# Patient Record
Sex: Female | Born: 1935
Health system: Southern US, Community
[De-identification: ages and names within clinical notes are randomized; demographics above are authoritative.]

## PROBLEM LIST (undated history)

## (undated) DIAGNOSIS — N12 Tubulo-interstitial nephritis, not specified as acute or chronic: Secondary | ICD-10-CM

## (undated) DIAGNOSIS — K8020 Calculus of gallbladder without cholecystitis without obstruction: Secondary | ICD-10-CM

## (undated) DIAGNOSIS — K5900 Constipation, unspecified: Secondary | ICD-10-CM

## (undated) DIAGNOSIS — G4700 Insomnia, unspecified: Secondary | ICD-10-CM

## (undated) DIAGNOSIS — N320 Bladder-neck obstruction: Secondary | ICD-10-CM

## (undated) DIAGNOSIS — Z87440 Personal history of urinary (tract) infections: Secondary | ICD-10-CM

## (undated) DIAGNOSIS — E11622 Type 2 diabetes mellitus with other skin ulcer: Secondary | ICD-10-CM

## (undated) DIAGNOSIS — E039 Hypothyroidism, unspecified: Secondary | ICD-10-CM

## (undated) DIAGNOSIS — N200 Calculus of kidney: Secondary | ICD-10-CM

## (undated) DIAGNOSIS — J3089 Other allergic rhinitis: Secondary | ICD-10-CM

## (undated) DIAGNOSIS — F32A Depression, unspecified: Secondary | ICD-10-CM

## (undated) DIAGNOSIS — N10 Acute tubulo-interstitial nephritis: Secondary | ICD-10-CM

## (undated) DIAGNOSIS — E550 Rickets, active: Secondary | ICD-10-CM

## (undated) DIAGNOSIS — F0280 Dementia in other diseases classified elsewhere without behavioral disturbance: Secondary | ICD-10-CM

## (undated) DIAGNOSIS — I10 Essential (primary) hypertension: Secondary | ICD-10-CM

## (undated) DIAGNOSIS — M21372 Foot drop, left foot: Secondary | ICD-10-CM

## (undated) DIAGNOSIS — Z87442 Personal history of urinary calculi: Secondary | ICD-10-CM

## (undated) DIAGNOSIS — Z960 Presence of urogenital implants: Secondary | ICD-10-CM

## (undated) DIAGNOSIS — K219 Gastro-esophageal reflux disease without esophagitis: Secondary | ICD-10-CM

## (undated) DIAGNOSIS — F29 Unspecified psychosis not due to a substance or known physiological condition: Secondary | ICD-10-CM

## (undated) DIAGNOSIS — R32 Unspecified urinary incontinence: Secondary | ICD-10-CM

## (undated) DIAGNOSIS — L98419 Non-pressure chronic ulcer of buttock with unspecified severity: Secondary | ICD-10-CM

## (undated) DIAGNOSIS — I499 Cardiac arrhythmia, unspecified: Secondary | ICD-10-CM

## (undated) DIAGNOSIS — R197 Diarrhea, unspecified: Secondary | ICD-10-CM

## (undated) DIAGNOSIS — K2100 Gastro-esophageal reflux disease with esophagitis, without bleeding: Secondary | ICD-10-CM

## (undated) DIAGNOSIS — E119 Type 2 diabetes mellitus without complications: Secondary | ICD-10-CM

## (undated) DIAGNOSIS — M1990 Unspecified osteoarthritis, unspecified site: Secondary | ICD-10-CM

## (undated) DIAGNOSIS — Z978 Presence of other specified devices: Secondary | ICD-10-CM

## (undated) DIAGNOSIS — G309 Alzheimer's disease, unspecified: Secondary | ICD-10-CM

## (undated) DIAGNOSIS — E559 Vitamin D deficiency, unspecified: Secondary | ICD-10-CM

## (undated) DIAGNOSIS — M21371 Foot drop, right foot: Secondary | ICD-10-CM

## (undated) DIAGNOSIS — A419 Sepsis, unspecified organism: Secondary | ICD-10-CM

## (undated) DIAGNOSIS — E785 Hyperlipidemia, unspecified: Secondary | ICD-10-CM

## (undated) DIAGNOSIS — F329 Major depressive disorder, single episode, unspecified: Secondary | ICD-10-CM

## (undated) HISTORY — DX: Unspecified osteoarthritis, unspecified site: M19.90

## (undated) HISTORY — PX: ABDOMINAL HYSTERECTOMY: SHX81

## (undated) HISTORY — DX: Vitamin D deficiency, unspecified: E55.9

## (undated) HISTORY — DX: Constipation, unspecified: K59.00

## (undated) HISTORY — DX: Essential (primary) hypertension: I10

## (undated) HISTORY — DX: Dementia in other diseases classified elsewhere, unspecified severity, without behavioral disturbance, psychotic disturbance, mood disturbance, and anxiety: F02.80

## (undated) HISTORY — DX: Rickets, active: E55.0

## (undated) HISTORY — DX: Gastro-esophageal reflux disease with esophagitis, without bleeding: K21.00

## (undated) HISTORY — DX: Unspecified psychosis not due to a substance or known physiological condition: F29

## (undated) HISTORY — DX: Gastro-esophageal reflux disease with esophagitis: K21.0

## (undated) HISTORY — DX: Alzheimer's disease, unspecified: G30.9

---

## 1995-02-02 HISTORY — PX: KNEE ARTHROSCOPY: SHX127

## 1997-06-14 ENCOUNTER — Other Ambulatory Visit: Admission: RE | Admit: 1997-06-14 | Discharge: 1997-06-14 | Payer: Self-pay | Admitting: *Deleted

## 1997-07-12 ENCOUNTER — Other Ambulatory Visit: Admission: RE | Admit: 1997-07-12 | Discharge: 1997-07-12 | Payer: Self-pay | Admitting: *Deleted

## 1997-12-30 ENCOUNTER — Ambulatory Visit (HOSPITAL_COMMUNITY): Admission: RE | Admit: 1997-12-30 | Discharge: 1997-12-30 | Payer: Self-pay | Admitting: *Deleted

## 1998-01-18 ENCOUNTER — Emergency Department (HOSPITAL_COMMUNITY): Admission: EM | Admit: 1998-01-18 | Discharge: 1998-01-18 | Payer: Self-pay | Admitting: Emergency Medicine

## 1998-01-18 ENCOUNTER — Encounter: Payer: Self-pay | Admitting: Emergency Medicine

## 1998-05-19 ENCOUNTER — Ambulatory Visit (HOSPITAL_COMMUNITY): Admission: RE | Admit: 1998-05-19 | Discharge: 1998-05-19 | Payer: Self-pay | Admitting: Orthopedic Surgery

## 1998-05-19 ENCOUNTER — Encounter: Payer: Self-pay | Admitting: Orthopedic Surgery

## 1998-09-25 ENCOUNTER — Ambulatory Visit (HOSPITAL_COMMUNITY): Admission: RE | Admit: 1998-09-25 | Discharge: 1998-09-25 | Payer: Self-pay | Admitting: *Deleted

## 1998-09-25 ENCOUNTER — Encounter: Payer: Self-pay | Admitting: *Deleted

## 1998-11-12 ENCOUNTER — Ambulatory Visit (HOSPITAL_COMMUNITY): Admission: RE | Admit: 1998-11-12 | Discharge: 1998-11-12 | Payer: Self-pay | Admitting: *Deleted

## 1998-11-12 ENCOUNTER — Encounter: Payer: Self-pay | Admitting: *Deleted

## 1998-12-19 ENCOUNTER — Encounter: Payer: Self-pay | Admitting: *Deleted

## 1998-12-19 ENCOUNTER — Ambulatory Visit (HOSPITAL_COMMUNITY): Admission: RE | Admit: 1998-12-19 | Discharge: 1998-12-19 | Payer: Self-pay | Admitting: *Deleted

## 1999-01-20 ENCOUNTER — Ambulatory Visit (HOSPITAL_COMMUNITY): Admission: RE | Admit: 1999-01-20 | Discharge: 1999-01-20 | Payer: Self-pay | Admitting: *Deleted

## 1999-01-20 ENCOUNTER — Encounter: Payer: Self-pay | Admitting: *Deleted

## 1999-03-01 ENCOUNTER — Ambulatory Visit (HOSPITAL_COMMUNITY): Admission: RE | Admit: 1999-03-01 | Discharge: 1999-03-01 | Payer: Self-pay | Admitting: *Deleted

## 1999-03-01 ENCOUNTER — Encounter: Payer: Self-pay | Admitting: *Deleted

## 1999-04-13 ENCOUNTER — Encounter: Payer: Self-pay | Admitting: *Deleted

## 1999-04-13 ENCOUNTER — Ambulatory Visit (HOSPITAL_COMMUNITY): Admission: RE | Admit: 1999-04-13 | Discharge: 1999-04-13 | Payer: Self-pay | Admitting: *Deleted

## 1999-06-29 ENCOUNTER — Encounter: Payer: Self-pay | Admitting: *Deleted

## 1999-06-30 ENCOUNTER — Encounter: Payer: Self-pay | Admitting: *Deleted

## 1999-07-01 ENCOUNTER — Inpatient Hospital Stay (HOSPITAL_COMMUNITY): Admission: RE | Admit: 1999-07-01 | Discharge: 1999-07-02 | Payer: Self-pay | Admitting: *Deleted

## 1999-07-01 ENCOUNTER — Encounter: Payer: Self-pay | Admitting: *Deleted

## 1999-07-02 ENCOUNTER — Encounter: Payer: Self-pay | Admitting: *Deleted

## 1999-08-02 ENCOUNTER — Encounter: Payer: Self-pay | Admitting: *Deleted

## 1999-08-02 ENCOUNTER — Ambulatory Visit (HOSPITAL_COMMUNITY): Admission: RE | Admit: 1999-08-02 | Discharge: 1999-08-02 | Payer: Self-pay | Admitting: *Deleted

## 1999-09-02 ENCOUNTER — Ambulatory Visit (HOSPITAL_COMMUNITY): Admission: RE | Admit: 1999-09-02 | Discharge: 1999-09-02 | Payer: Self-pay | Admitting: *Deleted

## 1999-09-02 ENCOUNTER — Encounter: Payer: Self-pay | Admitting: *Deleted

## 2000-02-09 ENCOUNTER — Ambulatory Visit (HOSPITAL_COMMUNITY): Admission: RE | Admit: 2000-02-09 | Discharge: 2000-02-09 | Payer: Self-pay | Admitting: *Deleted

## 2000-02-09 ENCOUNTER — Encounter: Payer: Self-pay | Admitting: Internal Medicine

## 2001-04-03 ENCOUNTER — Encounter: Admission: RE | Admit: 2001-04-03 | Discharge: 2001-04-03 | Payer: Self-pay | Admitting: Internal Medicine

## 2001-04-03 ENCOUNTER — Encounter: Payer: Self-pay | Admitting: Internal Medicine

## 2001-05-04 ENCOUNTER — Encounter: Payer: Self-pay | Admitting: Pain Medicine

## 2001-05-04 ENCOUNTER — Encounter: Admission: RE | Admit: 2001-05-04 | Discharge: 2001-05-04 | Payer: Self-pay | Admitting: Pain Medicine

## 2001-08-18 ENCOUNTER — Encounter: Payer: Self-pay | Admitting: Emergency Medicine

## 2001-08-18 ENCOUNTER — Emergency Department (HOSPITAL_COMMUNITY): Admission: EM | Admit: 2001-08-18 | Discharge: 2001-08-18 | Payer: Self-pay | Admitting: Emergency Medicine

## 2001-11-25 ENCOUNTER — Encounter: Payer: Self-pay | Admitting: Pain Medicine

## 2001-11-25 ENCOUNTER — Encounter: Admission: RE | Admit: 2001-11-25 | Discharge: 2001-11-25 | Payer: Self-pay | Admitting: Pain Medicine

## 2003-11-01 ENCOUNTER — Emergency Department (HOSPITAL_COMMUNITY): Admission: EM | Admit: 2003-11-01 | Discharge: 2003-11-01 | Payer: Self-pay | Admitting: Emergency Medicine

## 2004-01-02 ENCOUNTER — Ambulatory Visit (HOSPITAL_COMMUNITY): Admission: RE | Admit: 2004-01-02 | Discharge: 2004-01-02 | Payer: Self-pay | Admitting: *Deleted

## 2004-03-18 ENCOUNTER — Ambulatory Visit: Payer: Self-pay | Admitting: Internal Medicine

## 2005-02-02 ENCOUNTER — Emergency Department (HOSPITAL_COMMUNITY): Admission: EM | Admit: 2005-02-02 | Discharge: 2005-02-02 | Payer: Self-pay | Admitting: Emergency Medicine

## 2006-01-11 ENCOUNTER — Inpatient Hospital Stay (HOSPITAL_COMMUNITY): Admission: EM | Admit: 2006-01-11 | Discharge: 2006-01-17 | Payer: Self-pay | Admitting: Emergency Medicine

## 2006-01-11 ENCOUNTER — Ambulatory Visit: Payer: Self-pay | Admitting: Cardiology

## 2011-02-02 DIAGNOSIS — E785 Hyperlipidemia, unspecified: Secondary | ICD-10-CM | POA: Diagnosis not present

## 2011-02-02 DIAGNOSIS — H04123 Dry eye syndrome of bilateral lacrimal glands: Secondary | ICD-10-CM | POA: Diagnosis not present

## 2011-02-02 DIAGNOSIS — E568 Deficiency of other vitamins: Secondary | ICD-10-CM | POA: Diagnosis not present

## 2011-02-02 DIAGNOSIS — G301 Alzheimer's disease with late onset: Secondary | ICD-10-CM | POA: Diagnosis not present

## 2011-02-02 DIAGNOSIS — F23 Brief psychotic disorder: Secondary | ICD-10-CM | POA: Diagnosis not present

## 2011-02-02 DIAGNOSIS — G890 Central pain syndrome: Secondary | ICD-10-CM | POA: Diagnosis not present

## 2011-02-02 DIAGNOSIS — H2510 Age-related nuclear cataract, unspecified eye: Secondary | ICD-10-CM | POA: Diagnosis not present

## 2011-02-02 DIAGNOSIS — R059 Cough, unspecified: Secondary | ICD-10-CM | POA: Diagnosis not present

## 2011-02-02 DIAGNOSIS — E7800 Pure hypercholesterolemia, unspecified: Secondary | ICD-10-CM | POA: Diagnosis not present

## 2011-02-02 DIAGNOSIS — E875 Hyperkalemia: Secondary | ICD-10-CM | POA: Diagnosis not present

## 2011-02-02 DIAGNOSIS — Z794 Long term (current) use of insulin: Secondary | ICD-10-CM | POA: Diagnosis not present

## 2011-02-02 DIAGNOSIS — J208 Acute bronchitis due to other specified organisms: Secondary | ICD-10-CM | POA: Diagnosis not present

## 2011-02-02 DIAGNOSIS — G4700 Insomnia, unspecified: Secondary | ICD-10-CM | POA: Diagnosis not present

## 2011-02-02 DIAGNOSIS — L820 Inflamed seborrheic keratosis: Secondary | ICD-10-CM | POA: Diagnosis not present

## 2011-02-02 DIAGNOSIS — F0391 Unspecified dementia with behavioral disturbance: Secondary | ICD-10-CM | POA: Diagnosis not present

## 2011-02-02 DIAGNOSIS — E784 Other hyperlipidemia: Secondary | ICD-10-CM | POA: Diagnosis not present

## 2011-02-02 DIAGNOSIS — F329 Major depressive disorder, single episode, unspecified: Secondary | ICD-10-CM | POA: Diagnosis not present

## 2011-02-02 DIAGNOSIS — L02619 Cutaneous abscess of unspecified foot: Secondary | ICD-10-CM | POA: Diagnosis not present

## 2011-02-02 DIAGNOSIS — M1990 Unspecified osteoarthritis, unspecified site: Secondary | ICD-10-CM | POA: Diagnosis not present

## 2011-02-02 DIAGNOSIS — E169 Disorder of pancreatic internal secretion, unspecified: Secondary | ICD-10-CM | POA: Diagnosis not present

## 2011-02-02 DIAGNOSIS — F0280 Dementia in other diseases classified elsewhere without behavioral disturbance: Secondary | ICD-10-CM | POA: Diagnosis not present

## 2011-02-02 DIAGNOSIS — H8110 Benign paroxysmal vertigo, unspecified ear: Secondary | ICD-10-CM | POA: Diagnosis not present

## 2011-02-02 DIAGNOSIS — F29 Unspecified psychosis not due to a substance or known physiological condition: Secondary | ICD-10-CM | POA: Diagnosis not present

## 2011-02-02 DIAGNOSIS — H04129 Dry eye syndrome of unspecified lacrimal gland: Secondary | ICD-10-CM | POA: Diagnosis not present

## 2011-02-02 DIAGNOSIS — IMO0001 Reserved for inherently not codable concepts without codable children: Secondary | ICD-10-CM | POA: Diagnosis not present

## 2011-02-02 DIAGNOSIS — M79674 Pain in right toe(s): Secondary | ICD-10-CM | POA: Diagnosis not present

## 2011-02-02 DIAGNOSIS — H269 Unspecified cataract: Secondary | ICD-10-CM | POA: Diagnosis not present

## 2011-02-02 DIAGNOSIS — J811 Chronic pulmonary edema: Secondary | ICD-10-CM | POA: Diagnosis not present

## 2011-02-02 DIAGNOSIS — M150 Primary generalized (osteo)arthritis: Secondary | ICD-10-CM | POA: Diagnosis not present

## 2011-02-02 DIAGNOSIS — K219 Gastro-esophageal reflux disease without esophagitis: Secondary | ICD-10-CM | POA: Diagnosis not present

## 2011-02-02 DIAGNOSIS — K2100 Gastro-esophageal reflux disease with esophagitis, without bleeding: Secondary | ICD-10-CM | POA: Diagnosis not present

## 2011-02-02 DIAGNOSIS — L608 Other nail disorders: Secondary | ICD-10-CM | POA: Diagnosis not present

## 2011-02-02 DIAGNOSIS — F39 Unspecified mood [affective] disorder: Secondary | ICD-10-CM | POA: Diagnosis not present

## 2011-02-02 DIAGNOSIS — K5900 Constipation, unspecified: Secondary | ICD-10-CM | POA: Diagnosis not present

## 2011-02-02 DIAGNOSIS — F0390 Unspecified dementia without behavioral disturbance: Secondary | ICD-10-CM | POA: Diagnosis not present

## 2011-02-02 DIAGNOSIS — R9431 Abnormal electrocardiogram [ECG] [EKG]: Secondary | ICD-10-CM | POA: Diagnosis not present

## 2011-02-02 DIAGNOSIS — M79609 Pain in unspecified limb: Secondary | ICD-10-CM | POA: Diagnosis not present

## 2011-02-02 DIAGNOSIS — E860 Dehydration: Secondary | ICD-10-CM | POA: Diagnosis not present

## 2011-02-02 DIAGNOSIS — H2589 Other age-related cataract: Secondary | ICD-10-CM | POA: Diagnosis not present

## 2011-02-02 DIAGNOSIS — Z79899 Other long term (current) drug therapy: Secondary | ICD-10-CM | POA: Diagnosis not present

## 2011-02-02 DIAGNOSIS — F03918 Unspecified dementia, unspecified severity, with other behavioral disturbance: Secondary | ICD-10-CM | POA: Diagnosis not present

## 2011-02-02 DIAGNOSIS — M159 Polyosteoarthritis, unspecified: Secondary | ICD-10-CM | POA: Diagnosis not present

## 2011-02-02 DIAGNOSIS — I10 Essential (primary) hypertension: Secondary | ICD-10-CM | POA: Diagnosis not present

## 2011-02-02 DIAGNOSIS — E039 Hypothyroidism, unspecified: Secondary | ICD-10-CM | POA: Diagnosis not present

## 2011-02-02 DIAGNOSIS — Z7982 Long term (current) use of aspirin: Secondary | ICD-10-CM | POA: Diagnosis not present

## 2011-02-02 DIAGNOSIS — Z23 Encounter for immunization: Secondary | ICD-10-CM | POA: Diagnosis not present

## 2011-02-02 DIAGNOSIS — Z961 Presence of intraocular lens: Secondary | ICD-10-CM | POA: Diagnosis not present

## 2011-02-02 DIAGNOSIS — D485 Neoplasm of uncertain behavior of skin: Secondary | ICD-10-CM | POA: Diagnosis not present

## 2011-02-02 DIAGNOSIS — D649 Anemia, unspecified: Secondary | ICD-10-CM | POA: Diagnosis not present

## 2011-02-02 DIAGNOSIS — E1165 Type 2 diabetes mellitus with hyperglycemia: Secondary | ICD-10-CM | POA: Diagnosis not present

## 2011-02-02 DIAGNOSIS — M79675 Pain in left toe(s): Secondary | ICD-10-CM | POA: Diagnosis not present

## 2011-02-02 DIAGNOSIS — Z791 Long term (current) use of non-steroidal anti-inflammatories (NSAID): Secondary | ICD-10-CM | POA: Diagnosis not present

## 2011-02-02 DIAGNOSIS — N179 Acute kidney failure, unspecified: Secondary | ICD-10-CM | POA: Diagnosis not present

## 2011-02-02 DIAGNOSIS — G309 Alzheimer's disease, unspecified: Secondary | ICD-10-CM | POA: Diagnosis not present

## 2011-02-02 DIAGNOSIS — E1142 Type 2 diabetes mellitus with diabetic polyneuropathy: Secondary | ICD-10-CM | POA: Diagnosis not present

## 2011-02-02 DIAGNOSIS — R05 Cough: Secondary | ICD-10-CM | POA: Diagnosis not present

## 2011-02-02 DIAGNOSIS — E119 Type 2 diabetes mellitus without complications: Secondary | ICD-10-CM | POA: Diagnosis not present

## 2011-02-02 DIAGNOSIS — B351 Tinea unguium: Secondary | ICD-10-CM | POA: Diagnosis not present

## 2011-02-02 DIAGNOSIS — H3531 Nonexudative age-related macular degeneration: Secondary | ICD-10-CM | POA: Diagnosis not present

## 2011-02-02 DIAGNOSIS — M79672 Pain in left foot: Secondary | ICD-10-CM | POA: Diagnosis not present

## 2011-02-02 DIAGNOSIS — K117 Disturbances of salivary secretion: Secondary | ICD-10-CM | POA: Diagnosis not present

## 2011-02-02 DIAGNOSIS — H81399 Other peripheral vertigo, unspecified ear: Secondary | ICD-10-CM | POA: Diagnosis not present

## 2011-02-02 DIAGNOSIS — E559 Vitamin D deficiency, unspecified: Secondary | ICD-10-CM | POA: Diagnosis not present

## 2011-02-02 DIAGNOSIS — M79671 Pain in right foot: Secondary | ICD-10-CM | POA: Diagnosis not present

## 2011-02-02 DIAGNOSIS — E1049 Type 1 diabetes mellitus with other diabetic neurological complication: Secondary | ICD-10-CM | POA: Diagnosis not present

## 2011-02-02 DIAGNOSIS — N390 Urinary tract infection, site not specified: Secondary | ICD-10-CM | POA: Diagnosis not present

## 2011-02-02 DIAGNOSIS — E870 Hyperosmolality and hypernatremia: Secondary | ICD-10-CM | POA: Diagnosis not present

## 2011-02-10 DIAGNOSIS — E119 Type 2 diabetes mellitus without complications: Secondary | ICD-10-CM | POA: Diagnosis not present

## 2011-02-24 DIAGNOSIS — E870 Hyperosmolality and hypernatremia: Secondary | ICD-10-CM | POA: Diagnosis not present

## 2011-02-24 DIAGNOSIS — N179 Acute kidney failure, unspecified: Secondary | ICD-10-CM | POA: Diagnosis not present

## 2011-02-24 DIAGNOSIS — L03039 Cellulitis of unspecified toe: Secondary | ICD-10-CM | POA: Diagnosis not present

## 2011-03-27 DIAGNOSIS — F29 Unspecified psychosis not due to a substance or known physiological condition: Secondary | ICD-10-CM | POA: Diagnosis not present

## 2011-03-27 DIAGNOSIS — F39 Unspecified mood [affective] disorder: Secondary | ICD-10-CM | POA: Diagnosis not present

## 2011-03-27 DIAGNOSIS — F0391 Unspecified dementia with behavioral disturbance: Secondary | ICD-10-CM | POA: Diagnosis not present

## 2011-03-27 DIAGNOSIS — G4700 Insomnia, unspecified: Secondary | ICD-10-CM | POA: Diagnosis not present

## 2011-03-29 DIAGNOSIS — E7800 Pure hypercholesterolemia, unspecified: Secondary | ICD-10-CM | POA: Diagnosis not present

## 2011-04-26 DIAGNOSIS — G890 Central pain syndrome: Secondary | ICD-10-CM | POA: Diagnosis not present

## 2011-04-26 DIAGNOSIS — K5900 Constipation, unspecified: Secondary | ICD-10-CM | POA: Diagnosis not present

## 2011-04-26 DIAGNOSIS — E7800 Pure hypercholesterolemia, unspecified: Secondary | ICD-10-CM | POA: Diagnosis not present

## 2011-04-26 DIAGNOSIS — E559 Vitamin D deficiency, unspecified: Secondary | ICD-10-CM | POA: Diagnosis not present

## 2011-04-26 DIAGNOSIS — I10 Essential (primary) hypertension: Secondary | ICD-10-CM | POA: Diagnosis not present

## 2011-05-31 DIAGNOSIS — G890 Central pain syndrome: Secondary | ICD-10-CM | POA: Diagnosis not present

## 2011-05-31 DIAGNOSIS — K5900 Constipation, unspecified: Secondary | ICD-10-CM | POA: Diagnosis not present

## 2011-05-31 DIAGNOSIS — I10 Essential (primary) hypertension: Secondary | ICD-10-CM | POA: Diagnosis not present

## 2011-05-31 DIAGNOSIS — E559 Vitamin D deficiency, unspecified: Secondary | ICD-10-CM | POA: Diagnosis not present

## 2011-05-31 DIAGNOSIS — E7800 Pure hypercholesterolemia, unspecified: Secondary | ICD-10-CM | POA: Diagnosis not present

## 2011-06-21 DIAGNOSIS — G890 Central pain syndrome: Secondary | ICD-10-CM | POA: Diagnosis not present

## 2011-06-21 DIAGNOSIS — K5900 Constipation, unspecified: Secondary | ICD-10-CM | POA: Diagnosis not present

## 2011-06-21 DIAGNOSIS — E559 Vitamin D deficiency, unspecified: Secondary | ICD-10-CM | POA: Diagnosis not present

## 2011-06-21 DIAGNOSIS — I10 Essential (primary) hypertension: Secondary | ICD-10-CM | POA: Diagnosis not present

## 2011-06-21 DIAGNOSIS — E7800 Pure hypercholesterolemia, unspecified: Secondary | ICD-10-CM | POA: Diagnosis not present

## 2011-08-03 DIAGNOSIS — F0391 Unspecified dementia with behavioral disturbance: Secondary | ICD-10-CM | POA: Diagnosis not present

## 2011-08-03 DIAGNOSIS — G4700 Insomnia, unspecified: Secondary | ICD-10-CM | POA: Diagnosis not present

## 2011-08-03 DIAGNOSIS — F39 Unspecified mood [affective] disorder: Secondary | ICD-10-CM | POA: Diagnosis not present

## 2011-08-03 DIAGNOSIS — F29 Unspecified psychosis not due to a substance or known physiological condition: Secondary | ICD-10-CM | POA: Diagnosis not present

## 2011-08-13 DIAGNOSIS — E7800 Pure hypercholesterolemia, unspecified: Secondary | ICD-10-CM | POA: Diagnosis not present

## 2011-08-13 DIAGNOSIS — G890 Central pain syndrome: Secondary | ICD-10-CM | POA: Diagnosis not present

## 2011-08-13 DIAGNOSIS — I10 Essential (primary) hypertension: Secondary | ICD-10-CM | POA: Diagnosis not present

## 2011-08-13 DIAGNOSIS — E559 Vitamin D deficiency, unspecified: Secondary | ICD-10-CM | POA: Diagnosis not present

## 2011-08-13 DIAGNOSIS — K5900 Constipation, unspecified: Secondary | ICD-10-CM | POA: Diagnosis not present

## 2011-08-17 DIAGNOSIS — E119 Type 2 diabetes mellitus without complications: Secondary | ICD-10-CM | POA: Diagnosis not present

## 2011-08-17 DIAGNOSIS — H04129 Dry eye syndrome of unspecified lacrimal gland: Secondary | ICD-10-CM | POA: Diagnosis not present

## 2011-08-17 DIAGNOSIS — H2510 Age-related nuclear cataract, unspecified eye: Secondary | ICD-10-CM | POA: Diagnosis not present

## 2011-09-13 DIAGNOSIS — E119 Type 2 diabetes mellitus without complications: Secondary | ICD-10-CM | POA: Diagnosis not present

## 2011-09-13 DIAGNOSIS — E7800 Pure hypercholesterolemia, unspecified: Secondary | ICD-10-CM | POA: Diagnosis not present

## 2011-09-14 DIAGNOSIS — K5900 Constipation, unspecified: Secondary | ICD-10-CM | POA: Diagnosis not present

## 2011-09-14 DIAGNOSIS — G890 Central pain syndrome: Secondary | ICD-10-CM | POA: Diagnosis not present

## 2011-09-14 DIAGNOSIS — I10 Essential (primary) hypertension: Secondary | ICD-10-CM | POA: Diagnosis not present

## 2011-09-14 DIAGNOSIS — E7800 Pure hypercholesterolemia, unspecified: Secondary | ICD-10-CM | POA: Diagnosis not present

## 2011-09-14 DIAGNOSIS — E559 Vitamin D deficiency, unspecified: Secondary | ICD-10-CM | POA: Diagnosis not present

## 2011-10-15 DIAGNOSIS — K5900 Constipation, unspecified: Secondary | ICD-10-CM | POA: Diagnosis not present

## 2011-10-15 DIAGNOSIS — G890 Central pain syndrome: Secondary | ICD-10-CM | POA: Diagnosis not present

## 2011-10-15 DIAGNOSIS — I10 Essential (primary) hypertension: Secondary | ICD-10-CM | POA: Diagnosis not present

## 2011-10-15 DIAGNOSIS — E559 Vitamin D deficiency, unspecified: Secondary | ICD-10-CM | POA: Diagnosis not present

## 2011-10-15 DIAGNOSIS — F29 Unspecified psychosis not due to a substance or known physiological condition: Secondary | ICD-10-CM | POA: Diagnosis not present

## 2011-10-18 DIAGNOSIS — F0391 Unspecified dementia with behavioral disturbance: Secondary | ICD-10-CM | POA: Diagnosis not present

## 2011-10-18 DIAGNOSIS — G4700 Insomnia, unspecified: Secondary | ICD-10-CM | POA: Diagnosis not present

## 2011-10-18 DIAGNOSIS — F39 Unspecified mood [affective] disorder: Secondary | ICD-10-CM | POA: Diagnosis not present

## 2011-11-19 DIAGNOSIS — I10 Essential (primary) hypertension: Secondary | ICD-10-CM | POA: Diagnosis not present

## 2011-11-19 DIAGNOSIS — K5900 Constipation, unspecified: Secondary | ICD-10-CM | POA: Diagnosis not present

## 2011-11-22 DIAGNOSIS — D649 Anemia, unspecified: Secondary | ICD-10-CM | POA: Diagnosis not present

## 2011-11-29 DIAGNOSIS — J811 Chronic pulmonary edema: Secondary | ICD-10-CM | POA: Diagnosis not present

## 2011-12-07 DIAGNOSIS — N390 Urinary tract infection, site not specified: Secondary | ICD-10-CM | POA: Diagnosis not present

## 2012-01-24 DIAGNOSIS — E119 Type 2 diabetes mellitus without complications: Secondary | ICD-10-CM | POA: Diagnosis not present

## 2012-01-24 DIAGNOSIS — E559 Vitamin D deficiency, unspecified: Secondary | ICD-10-CM | POA: Diagnosis not present

## 2012-02-14 DIAGNOSIS — F0391 Unspecified dementia with behavioral disturbance: Secondary | ICD-10-CM | POA: Diagnosis not present

## 2012-02-14 DIAGNOSIS — F39 Unspecified mood [affective] disorder: Secondary | ICD-10-CM | POA: Diagnosis not present

## 2012-02-15 DIAGNOSIS — E559 Vitamin D deficiency, unspecified: Secondary | ICD-10-CM | POA: Diagnosis not present

## 2012-02-15 DIAGNOSIS — G890 Central pain syndrome: Secondary | ICD-10-CM | POA: Diagnosis not present

## 2012-02-15 DIAGNOSIS — K5900 Constipation, unspecified: Secondary | ICD-10-CM | POA: Diagnosis not present

## 2012-02-15 DIAGNOSIS — I10 Essential (primary) hypertension: Secondary | ICD-10-CM | POA: Diagnosis not present

## 2012-04-04 DIAGNOSIS — G890 Central pain syndrome: Secondary | ICD-10-CM | POA: Diagnosis not present

## 2012-06-13 ENCOUNTER — Non-Acute Institutional Stay (SKILLED_NURSING_FACILITY): Payer: Medicare Other | Admitting: Adult Health

## 2012-06-13 DIAGNOSIS — I10 Essential (primary) hypertension: Secondary | ICD-10-CM | POA: Diagnosis not present

## 2012-06-13 DIAGNOSIS — K219 Gastro-esophageal reflux disease without esophagitis: Secondary | ICD-10-CM

## 2012-06-13 DIAGNOSIS — F29 Unspecified psychosis not due to a substance or known physiological condition: Secondary | ICD-10-CM

## 2012-06-13 DIAGNOSIS — M1990 Unspecified osteoarthritis, unspecified site: Secondary | ICD-10-CM | POA: Diagnosis not present

## 2012-06-23 DIAGNOSIS — D485 Neoplasm of uncertain behavior of skin: Secondary | ICD-10-CM | POA: Diagnosis not present

## 2012-06-23 DIAGNOSIS — L820 Inflamed seborrheic keratosis: Secondary | ICD-10-CM | POA: Diagnosis not present

## 2012-07-05 DIAGNOSIS — E7800 Pure hypercholesterolemia, unspecified: Secondary | ICD-10-CM | POA: Diagnosis not present

## 2012-08-16 ENCOUNTER — Non-Acute Institutional Stay (SKILLED_NURSING_FACILITY): Payer: Medicare Other | Admitting: Internal Medicine

## 2012-08-16 DIAGNOSIS — K219 Gastro-esophageal reflux disease without esophagitis: Secondary | ICD-10-CM

## 2012-08-16 DIAGNOSIS — M1990 Unspecified osteoarthritis, unspecified site: Secondary | ICD-10-CM

## 2012-08-16 DIAGNOSIS — I10 Essential (primary) hypertension: Secondary | ICD-10-CM | POA: Diagnosis not present

## 2012-08-16 DIAGNOSIS — F29 Unspecified psychosis not due to a substance or known physiological condition: Secondary | ICD-10-CM

## 2012-08-16 DIAGNOSIS — E119 Type 2 diabetes mellitus without complications: Secondary | ICD-10-CM

## 2012-08-16 NOTE — Progress Notes (Signed)
Patient ID: Ana Newman, female   DOB: 1935-03-01, 77 y.o.   MRN: [id number]  Location:  [facility] Provider:  Gwenith Spitz. Renato Gails, D.O., C.M.D.  Code Status:  Full code Chief Complaint: medical mgt of chronic diseases  HPI:  77 yo white female here for long term care.  She stays in her bed at all times.  She has scheduled labs ordered.  She has no active complaints.  She is adamant about using her naproxen despite explained decline in renal function.  Review of Systems:  Review of Systems  Constitutional: Negative for fever and chills.  HENT: Negative for congestion.   Eyes: Positive for blurred vision.  Respiratory: Negative for shortness of breath.   Cardiovascular: Negative for chest pain.  Gastrointestinal: Positive for constipation. Negative for heartburn.  Genitourinary: Negative for dysuria.  Musculoskeletal: Positive for joint pain.  Skin: Negative for rash.  Neurological: Negative for dizziness and loss of consciousness.  Endo/Heme/Allergies: Positive for environmental allergies.  Psychiatric/Behavioral: Positive for hallucinations.     Medications: Patient's Medications   No medications on file    Physical Exam: There were no vitals filed for this visit. Physical Exam  Constitutional: No distress.  HENT:  Head: Normocephalic and atraumatic.  Cardiovascular: Normal rate, regular rhythm, normal heart sounds and intact distal pulses.   Pulmonary/Chest: Effort normal and breath sounds normal. No respiratory distress.  Abdominal: Soft. Bowel sounds are normal. She exhibits no distension. There is no tenderness.  Neurological: She is alert.  Skin: Skin is warm and dry.  Psychiatric:  Irritable, requests to be left alone   Labs reviewed: Has ordered  Assessment/Plan 1. Osteoarthritis Of knees, fingers Adamant about continuing her naproxen despite declining renal functions with regular use, will not take tylenol  2. GERD (gastroesophageal  reflux disease) Cont zantac as needed due to her NSAID use, monitor for bleeding  3. Diabetes mellitus type II, controlled F/u hba1c as ordered;  On lantus twice daily  4. Essential hypertension, benign BP at goal with metoprolol tartrate  5. Unspecified psychosis -ongoing mood issues per staff and based on visit today--continues on lamictal for mood stability  Labs/tests ordered:  Cbc, bmp, hba1c as scheduled

## 2012-08-25 ENCOUNTER — Encounter: Payer: Self-pay | Admitting: *Deleted

## 2012-08-28 ENCOUNTER — Encounter: Payer: Self-pay | Admitting: *Deleted

## 2012-08-29 DIAGNOSIS — H04129 Dry eye syndrome of unspecified lacrimal gland: Secondary | ICD-10-CM | POA: Diagnosis not present

## 2012-08-29 DIAGNOSIS — E119 Type 2 diabetes mellitus without complications: Secondary | ICD-10-CM | POA: Diagnosis not present

## 2012-08-29 DIAGNOSIS — H2510 Age-related nuclear cataract, unspecified eye: Secondary | ICD-10-CM | POA: Diagnosis not present

## 2012-10-17 ENCOUNTER — Non-Acute Institutional Stay (SKILLED_NURSING_FACILITY): Payer: Medicare Other | Admitting: Nurse Practitioner

## 2012-10-17 DIAGNOSIS — E559 Vitamin D deficiency, unspecified: Secondary | ICD-10-CM | POA: Diagnosis not present

## 2012-10-17 DIAGNOSIS — I10 Essential (primary) hypertension: Secondary | ICD-10-CM

## 2012-10-17 DIAGNOSIS — M1990 Unspecified osteoarthritis, unspecified site: Secondary | ICD-10-CM

## 2012-10-17 DIAGNOSIS — F29 Unspecified psychosis not due to a substance or known physiological condition: Secondary | ICD-10-CM

## 2012-10-18 DIAGNOSIS — N179 Acute kidney failure, unspecified: Secondary | ICD-10-CM | POA: Diagnosis not present

## 2012-10-18 DIAGNOSIS — E119 Type 2 diabetes mellitus without complications: Secondary | ICD-10-CM | POA: Diagnosis not present

## 2012-10-18 DIAGNOSIS — E568 Deficiency of other vitamins: Secondary | ICD-10-CM | POA: Diagnosis not present

## 2012-10-22 ENCOUNTER — Encounter: Payer: Self-pay | Admitting: Nurse Practitioner

## 2012-10-22 DIAGNOSIS — F29 Unspecified psychosis not due to a substance or known physiological condition: Secondary | ICD-10-CM | POA: Insufficient documentation

## 2012-10-22 DIAGNOSIS — I10 Essential (primary) hypertension: Secondary | ICD-10-CM | POA: Insufficient documentation

## 2012-10-22 DIAGNOSIS — M1990 Unspecified osteoarthritis, unspecified site: Secondary | ICD-10-CM | POA: Insufficient documentation

## 2012-10-22 DIAGNOSIS — E559 Vitamin D deficiency, unspecified: Secondary | ICD-10-CM | POA: Insufficient documentation

## 2012-10-22 NOTE — Progress Notes (Signed)
Patient ID: Ana Newman, female   DOB: Jul 31, 1935, 77 y.o.   MRN: [id number]  Nursing Home Location:  [facility]   Place of Service: SNF [phone])  Chief Complaint  Patient presents with  . Medical Managment of Chronic Issues    HPI:  77 year old female who is a long term resident of [facility] is being seen today for routine follow up. Pt reports she is doing well and is without any complaints. Nursing does not have concerns at this time.  REASSESSMENT OF ONGOING PROBLEMS:    DM, UNCOMPLICATED TYPE II, UNCONTROLLED The diabetes remains stable.No complications noted from the medication presently being used. is taking lantus 20 units in the pm and 25 units in the am   VITAMIN D DEFICIENCY  is taking vitamin d 50,000 units  monthly   UNSPECIFIED PSYCHOSIS  there are no reports of behavioral issues she is taking lamictal 100 mg daily to help stabilize mood.    ALZHEIMER'S The Alzheimer's disease has not changed. The patient has had little change in Behavior. Pt is not on any medication at this time  PAIN, CHRONIC  she is not voicing any pain is taking aleve 2 tabs  twice daily - does not wish to have a dose reduction or stop taking aleve; pt was educated on risk of cont medication and she reports she is aware and would like to cont taking medication  HYPERTENSION, BENIGN blood pressures are in target range. No complications noted from the medication presently being used. is taking Lopressor 25 mg twice daily asa 325 mg daily  GERD The patient's dyspeptic symptoms remain stable.No complications noted from the medication presently being used. takes zantac 150 mg daily   CONSTIPATION The symptoms are stable. No other therapies have been tried.The over-the-counter medication is well tolerated.is taking 2 senna s tabs nightly. Colace 100mg  daily prn, DolcoLax 10mg  pr q3 days prn Review of Systems:    DATA OBTAINED: from patient GENERAL: Feels well no fevers, fatigue,  appetite changes SKIN: No itching, rash or wounds MOUTH/THROAT: No mouth or tooth pain, No sore throat, No difficulty chewing or swallowing  RESPIRATORY: No cough, wheezing, SOB CARDIAC: No chest pain, palpitations, lower extremity edema  GI: No abdominal pain, No N/V/D or constipation, No heartburn or reflux  GU: No dysuria, frequency or urgency MUSCULOSKELETAL: No pain NEUROLOGIC: Awake, alert, appropriate to situation, No change in mental status. Moves all four, no focal deficits PSYCHIATRIC: No overt anxiety or sadness. Sleeps well. No behavior issue.     Medications: Patient's Medications  New Prescriptions   No medications on file  Previous Medications   ASPIRIN 325 MG TABLET    Take 325 mg by mouth daily. Take 1 tablet by mouth once daily.   BISACODYL (DULCOLAX) 10 MG SUPPOSITORY    Place 10 mg rectally as needed for constipation. Give 1 rectal suppository every 3 days as needed for no BM per standing order.   CHOLECALCIFEROL 50000 UNITS TABS    Take 50,000 Units by mouth every 30 (thirty) days. Take 1 tablet once every month on the 15th.   DOCUSATE SODIUM (COLACE) 100 MG CAPSULE    Take 100 mg by mouth daily. Take 1 tablet by mouth daily as needed for constipation.   INSULIN GLARGINE (LANTUS) 100 UNIT/ML INJECTION    Inject 20 Units into the skin at bedtime. Inject 20 units subcutaneously at bedtime for diabetes. Inject 20 units subcutaneously in the morning for diabetes.   LAMOTRIGINE (  LAMICTAL) 100 MG TABLET    Take 100 mg by mouth daily. Take 1 tablet daily by mouth for mood disorder.   LORATADINE (CLARITIN) 10 MG TABLET    Take 10 mg by mouth daily. Give 1 tablet every 24 hours as needed for allergies.   METOPROLOL TARTRATE (LOPRESSOR) 25 MG TABLET    Take 25 mg by mouth 2 (two) times daily. Give 1 tablet by mouth twice daily for hypertension.   NAPROXEN SODIUM (ANAPROX) 220 MG TABLET    Take 220 mg by mouth 2 (two) times daily with a meal. Take 2 tablet by mouth as needed for  pain. Should be given with food or milk.   POLYETHYLENE GLYCOL (MIRALAX / GLYCOLAX) PACKET    Take 17 g by mouth daily. Give one 17 gram by mouth once daily for constipation.   PROMETHAZINE (PHENERGAN) 25 MG TABLET    Take 25 mg by mouth every 6 (six) hours as needed for nausea. Give 1 tablet by mouth daily, every 6 hours as needed for nausea.   PROPYLENE GLYCOL (SYSTANE BALANCE) 0.6 % SOLN    Apply to eye 2 (two) times daily. Instill 1 drop to both eyes twice daily.   RANITIDINE (ZANTAC) 150 MG TABLET    Take 150 mg by mouth daily.   SENNA-DOCUSATE (SENOKOT S) 8.6-50 MG PER TABLET    Take 1 tablet by mouth daily. Take 2 tablets by mouth daily for constipation.  Modified Medications   No medications on file  Discontinued Medications   No medications on file     Physical Exam:  Filed Vitals:   10/17/12 1922  BP: 138/67  Pulse: 70  Temp: 97.6 F (36.4 C)  Resp: 20    GENERAL APPEARANCE: Alert, conversant. Appropriately groomed. No acute distress.  SKIN: No diaphoresis rash, or wounds HEAD: Normocephalic, atraumatic  EYES: Conjunctiva/lids clear. Pupils round, reactive. EOMs intact.  EARS: External exam WNL. Hearing grossly normal.  NOSE: No deformity or discharge.  MOUTH/THROAT: Lips w/o lesions. Mouth and throat normal. Tongue moist, w/o lesion.  NECK: No thyroid tenderness, enlargement or nodule  RESPIRATORY: Breathing is even, unlabored. Lung sounds are clear   CARDIOVASCULAR: Heart RRR no murmurs, rubs or gallops. No peripheral edema.  ARTERIAL: radial pulse 2+  VENOUS: No varicosities. No venous stasis skin changes  GASTROINTESTINAL: Abdomen is soft, non-tender, not distended w/ normal bowel sounds. No mass, ventral or inguinal hernia. No organomegally GENITOURINARY: Bladder non tender, not distended  MUSCULOSKELETAL: No abnormal joints or musculature NEUROLOGIC: Oriented X3. Cranial nerves 2-12 grossly intact. Moves all extremities no tremor. PSYCHIATRIC: Mood and affect  appropriate to situation, no behavioral issues  Assessment/Plan 1. Essential hypertension, benign Patient is stable; continue current regimen. Will monitor and make changes as necessary.  2. Type II or unspecified type diabetes mellitus without mention of complication, uncontrolled Blood sugars reviewed and stable at this time will get hgb A1c  3. Unspecified psychosis Behaviors stable at this time; will cont current medications  4. Unspecified vitamin D deficiency Will follow up vit D level  5. Osteoarthritis Pt not in current pain discussed dose reduction and changing medication however Pt reports she wishes to cont aleve despite being educated on harm it can cause; will follow up BMP

## 2012-12-07 NOTE — Progress Notes (Signed)
Patient ID: Ana Newman, female   DOB: October 28, 1935, 77 y.o.   MRN: [id number]  [facility]  No Known Allergies  Chief Complaint  Patient presents with  . Medical Managment of Chronic Issues    HPI  She is being seen for the management of her chronic illnesses. There are no concerns being voiced by the nursing staff at this time. She is unable to fully participate in the hpi or ros. She spends all of her time in her bed per her choice.   Past Medical History  Diagnosis Date  . Reflux esophagitis   . Unspecified constipation   . Essential hypertension, benign   . Alzheimer's disease   . Type II or unspecified type diabetes mellitus without mention of complication, uncontrolled   . Rickets, active   . Unspecified psychosis   . Unspecified vitamin D deficiency   . Osteoarthritis      Past Surgical History  Procedure Laterality Date  . Abdominal hysterectomy    . Joint replacement Left     Filed Vitals:   06/13/12 1131  BP: 125/70  Pulse: 70   She declines weights   MEDICATIONS  Asa 325 mg daily Vit d 50,000 units monthly Colace daily as needed lamictal 100 mg daily lantus 20 units in the am and 25 units nightly claritin daily as needed Lopressor 25 mg twice daily miralax daily Aleve 1 tab twice daily Senna s 2 tabs daily systane balance both eyes twice daily zantac 150 mg daily   ASSESSMENT/PLAN  1. Hypertension: she is stable will conitnue her lopressor 25 mg twice daily and will monitor   2. Osteoarthritis: she is not having joint pain at this time; will continue aleve twice daily  3. Diabetes: is stable will continue lantus 20 units in the am and 25 units nightly  4. Psychosis: she is emotionally stable; she is presently not on an antipsychotic; will continue her lamictal 100 mg daily to help stabilize her mood.   5. gerd will continue zantac 150 mg daily

## 2012-12-11 ENCOUNTER — Non-Acute Institutional Stay (SKILLED_NURSING_FACILITY): Payer: Medicare Other | Admitting: Nurse Practitioner

## 2012-12-11 DIAGNOSIS — I10 Essential (primary) hypertension: Secondary | ICD-10-CM | POA: Diagnosis not present

## 2012-12-11 DIAGNOSIS — IMO0001 Reserved for inherently not codable concepts without codable children: Secondary | ICD-10-CM

## 2012-12-11 DIAGNOSIS — F29 Unspecified psychosis not due to a substance or known physiological condition: Secondary | ICD-10-CM

## 2012-12-11 DIAGNOSIS — M1990 Unspecified osteoarthritis, unspecified site: Secondary | ICD-10-CM

## 2012-12-11 DIAGNOSIS — H8110 Benign paroxysmal vertigo, unspecified ear: Secondary | ICD-10-CM | POA: Diagnosis not present

## 2012-12-11 DIAGNOSIS — E559 Vitamin D deficiency, unspecified: Secondary | ICD-10-CM

## 2012-12-11 NOTE — Progress Notes (Signed)
Patient ID: Ana Newman, female   DOB: 01-May-1935, 77 y.o.   MRN: [id number] Nursing Home Location:  [facility]   Place of Service: SNF (31)  No Known Allergies  Chief Complaint  Patient presents with  . Medical Managment of Chronic Issues    HPI:  77 year old female who is a long term resident of [facility] is being seen today for routine follow up. Pt reports she is doing well however reports increase in dizziness when she moves her head and therefore does not like getting out of the bed; reports the dizziness stays with her for many hours; denies weakness, ringing in ears, no vomiting,  Nursing does not have concerns at this time.  REASSESSMENT OF ONGOING PROBLEMS:  DM, UNCOMPLICATED TYPE II, UNCONTROLLED The diabetes remains stable.No complications noted from the medication presently being used. is taking lantus 20 units in the pm and 25 units in the am  VITAMIN D DEFICIENCY is taking vitamin d 50,000 units monthly  UNSPECIFIED PSYCHOSIS there are no reports of behavioral issues she is taking lamictal 100 mg daily to help stabilize mood.  ALZHEIMER'S The Alzheimer's disease has not changed. The patient has had little change in Behavior. Pt is not on any medication at this time  PAIN, CHRONIC she is not voicing any pain is taking aleve 2 tabs daily HYPERTENSION, BENIGN blood pressures are in target range. No complications noted from the medication presently being used. is taking Lopressor 25 mg twice daily asa 325 mg daily  GERD The patient's dyspeptic symptoms remain stable.No complications noted from the medication presently being used. takes zantac 150 mg daily  CONSTIPATION The symptoms are stable. No other therapies have been tried.The over-the-counter medication is well tolerated.is taking 2 senna s tabs nightly. Colace 100mg  daily prn, DolcoLax 10mg  pr q3 days prn  Review of Systems:  Review of Systems  HENT: Negative for hearing loss and tinnitus.    Respiratory: Negative for shortness of breath.   Cardiovascular: Negative for chest pain.  Gastrointestinal: Negative for heartburn and constipation.  Genitourinary: Negative for dysuria, urgency and frequency.  Musculoskeletal: Negative for joint pain and myalgias.  Skin: Negative.   Neurological: Positive for dizziness. Negative for tingling.  Psychiatric/Behavioral: Positive for memory loss. Negative for depression. The patient is not nervous/anxious.      Past Medical History  Diagnosis Date  . Reflux esophagitis   . Unspecified constipation   . Essential hypertension, benign   . Alzheimer's disease   . Type II or unspecified type diabetes mellitus without mention of complication, uncontrolled   . Rickets, active   . Unspecified psychosis   . Unspecified vitamin D deficiency   . Osteoarthritis    Past Surgical History  Procedure Laterality Date  . Abdominal hysterectomy    . Joint replacement Left    Social History:   has no tobacco, alcohol, and drug history on file.  No family history on file.  Medications: Patient's Medications  New Prescriptions   No medications on file  Previous Medications   ASPIRIN 325 MG TABLET    Take 325 mg by mouth daily. Take 1 tablet by mouth once daily.   BISACODYL (DULCOLAX) 10 MG SUPPOSITORY    Place 10 mg rectally as needed for constipation. Give 1 rectal suppository every 3 days as needed for no BM per standing order.   CHOLECALCIFEROL 50000 UNITS TABS    Take 50,000 Units by mouth every 30 (thirty) days. Take 1 tablet once  every month on the 15th.   DOCUSATE SODIUM (COLACE) 100 MG CAPSULE    Take 100 mg by mouth daily. Take 1 tablet by mouth daily as needed for constipation.   INSULIN GLARGINE (LANTUS) 100 UNIT/ML INJECTION    Inject 20 Units into the skin at bedtime. Inject 20 units subcutaneously at bedtime for diabetes. Inject 20 units subcutaneously in the morning for diabetes.   LAMOTRIGINE (LAMICTAL) 100 MG TABLET    Take 100  mg by mouth daily. Take 1 tablet daily by mouth for mood disorder.   LORATADINE (CLARITIN) 10 MG TABLET    Take 10 mg by mouth daily. Give 1 tablet every 24 hours as needed for allergies.   METOPROLOL TARTRATE (LOPRESSOR) 25 MG TABLET    Take 25 mg by mouth 2 (two) times daily. Give 1 tablet by mouth twice daily for hypertension.   NAPROXEN SODIUM (ANAPROX) 220 MG TABLET    Take 220 mg by mouth daily. Take 2 tablet by mouth as needed for pain. Should be given with food or milk.   POLYETHYLENE GLYCOL (MIRALAX / GLYCOLAX) PACKET    Take 17 g by mouth daily. Give one 17 gram by mouth once daily for constipation.   PROMETHAZINE (PHENERGAN) 25 MG TABLET    Take 25 mg by mouth every 6 (six) hours as needed for nausea. Give 1 tablet by mouth daily, every 6 hours as needed for nausea.   PROPYLENE GLYCOL (SYSTANE BALANCE) 0.6 % SOLN    Apply to eye 2 (two) times daily. Instill 1 drop to both eyes twice daily.   RANITIDINE (ZANTAC) 150 MG TABLET    Take 150 mg by mouth daily.   SENNA-DOCUSATE (SENOKOT S) 8.6-50 MG PER TABLET    Take 1 tablet by mouth daily. Take 2 tablets by mouth daily for constipation.  Modified Medications   No medications on file  Discontinued Medications   No medications on file     Physical Exam:  Filed Vitals:   12/11/12 1500  BP: 125/78  Pulse: 69  Temp: 98.4 F (36.9 C)  Resp: 18   GENERAL APPEARANCE: Alert, conversant. Appropriately groomed. No acute distress.  SKIN: No diaphoresis rash, or wounds  HEAD: Normocephalic, atraumatic  EYES: Conjunctiva/lids clear. Pupils round, reactive. EOMs intact.  EARS: External exam WNL. Hearing grossly normal.  NOSE: No deformity or discharge.  MOUTH/THROAT: Lips w/o lesions. Mouth and throat normal. Tongue moist, w/o lesion.  NECK: No thyroid tenderness, enlargement or nodule  RESPIRATORY: Breathing is even, unlabored. Lung sounds are clear  CARDIOVASCULAR: Heart RRR no murmurs, rubs or gallops. No peripheral edema.  ARTERIAL:  radial pulse 2+  VENOUS: No varicosities. No venous stasis skin changes  GASTROINTESTINAL: Abdomen is soft, non-tender, not distended w/ normal bowel sounds. GENITOURINARY: Bladder non tender, not distended  MUSCULOSKELETAL: No abnormal joints or musculature  NEUROLOGIC: Oriented to self  Cranial nerves 2-12 grossly intact. Moves all extremities no tremor.  PSYCHIATRIC: Mood and affect appropriate to situation, no behavioral issues    Labs reviewed  CMP with Estimated GFR    Result: 10/18/2012 10:04 AM   ( Status: F )     C Sodium 140     135-145 mEq/L SLN   Potassium 4.6     3.5-5.3 mEq/L SLN   Chloride 103     96-112 mEq/L SLN   CO2 27     19-32 mEq/L SLN   Glucose 73     70-99 mg/dL SLN   BUN 16  6-23 mg/dL SLN   Creatinine 1.61     0.50-1.10 mg/dL SLN   Bilirubin, Total 0.3     0.3-1.2 mg/dL SLN   Alkaline Phosphatase 81     39-117 U/L SLN   AST/SGOT 13     0-37 U/L SLN   ALT/SGPT 14     0-35 U/L SLN   Total Protein 6.6     6.0-8.3 g/dL SLN   Albumin 3.7     0.9-6.0 g/dL SLN   Calcium 9.4     4.5-40.9 mg/dL SLN   Est GFR, African American >89      mL/min SLN   Est GFR, NonAfrican American 86      mL/min SLN C CBC with Diff    Result: 10/18/2012 11:09 AM   ( Status: F )       WBC 8.5     4.0-10.5 K/uL SLN   RBC 4.42     3.87-5.11 MIL/uL SLN   Hemoglobin 13.0     12.0-15.0 g/dL SLN   Hematocrit 81.1     36.0-46.0 % SLN   MCV 85.7     78.0-100.0 fL SLN   MCH 29.4     26.0-34.0 pg SLN   MCHC 34.3     30.0-36.0 g/dL SLN   RDW 91.4     78.2-95.6 % SLN   Platelet Count 313     150-400 K/uL SLN   Granulocyte % 37   L 43-77 % SLN   Absolute Gran 3.1     1.7-7.7 K/uL SLN   Lymph % 50   H 12-46 % SLN   Absolute Lymph 4.3   H 0.7-4.0 K/uL SLN   Mono % 10     3-12 % SLN   Absolute Mono 0.8     0.1-1.0 K/uL SLN   Eos % 3     0-5 % SLN   Absolute Eos 0.3     0.0-0.7 K/uL SLN   Baso % 0     0-1 % SLN   Absolute Baso 0.0     0.0-0.1 K/uL SLN   Smear Review Criteria for review  not met  SLN   Hemoglobin A1C    Result: 10/18/2012 4:06 PM   ( Status: F )       Hemoglobin A1C 5.7   H <5.7 % SLN C Estimated Average Glucose 117   H <117 mg/dL SLN   Vitamin D (21-HYQMVHQ)    Result: 10/18/2012 2:16 PM   ( Status: F )       Vitamin D (25-Hydroxy) 36       Assessment/Plan 1. Essential hypertension, benign Patient is stable; continue current regimen. Will monitor and make changes as necessary.  2. Type II or unspecified type diabetes mellitus without mention of complication, uncontrolled cbgs reviewed and stable; A1c at goal; will cnt current medications  3. Osteoarthritis Arthritis remains stable  4. Unspecified vitamin D deficiency Vit d improved; will cont on current medications and follow vit D level  5. Unspecified psychosis No increase in behaviors; have been stable on current medications  6. Benign paroxysmal positional vertigo Will start pt on meclizine 25 mg by mouth TID as needed for dizziness

## 2012-12-13 DIAGNOSIS — F39 Unspecified mood [affective] disorder: Secondary | ICD-10-CM | POA: Diagnosis not present

## 2013-02-13 DIAGNOSIS — E119 Type 2 diabetes mellitus without complications: Secondary | ICD-10-CM | POA: Diagnosis not present

## 2013-02-13 DIAGNOSIS — H2510 Age-related nuclear cataract, unspecified eye: Secondary | ICD-10-CM | POA: Diagnosis not present

## 2013-02-13 DIAGNOSIS — H04129 Dry eye syndrome of unspecified lacrimal gland: Secondary | ICD-10-CM | POA: Diagnosis not present

## 2013-02-14 ENCOUNTER — Non-Acute Institutional Stay (SKILLED_NURSING_FACILITY): Payer: Medicare Other | Admitting: Internal Medicine

## 2013-02-14 DIAGNOSIS — I10 Essential (primary) hypertension: Secondary | ICD-10-CM

## 2013-02-14 DIAGNOSIS — K219 Gastro-esophageal reflux disease without esophagitis: Secondary | ICD-10-CM

## 2013-02-14 DIAGNOSIS — IMO0001 Reserved for inherently not codable concepts without codable children: Secondary | ICD-10-CM

## 2013-02-14 DIAGNOSIS — M1990 Unspecified osteoarthritis, unspecified site: Secondary | ICD-10-CM

## 2013-02-14 DIAGNOSIS — F29 Unspecified psychosis not due to a substance or known physiological condition: Secondary | ICD-10-CM

## 2013-02-14 DIAGNOSIS — R05 Cough: Secondary | ICD-10-CM | POA: Diagnosis not present

## 2013-02-14 DIAGNOSIS — R059 Cough, unspecified: Secondary | ICD-10-CM

## 2013-02-14 DIAGNOSIS — E1165 Type 2 diabetes mellitus with hyperglycemia: Secondary | ICD-10-CM

## 2013-02-14 NOTE — Progress Notes (Signed)
Patient ID: Ana Newman, female   DOB: 02-Jan-1936, 78 y.o.   MRN: [id number]  Location:  [facility] Provider:  Rexene Edison. Mariea Clonts, D.O., C.M.D.  Code Status:  Full code  Chief Complaint  Patient presents with  . Medical Managment of Chronic Issues    HPI:  78 yo white female long term care resident seen for medical mgt of her chronic diseases.  She c/o cough.  Requests prn cough syrup (standing order but needs diabetic type).  No other concerns or related symptoms but did thank me for coming to check on her.  She spends her time in her room in bed.    Review of Systems:  Review of Systems  Constitutional: Negative for fever.  Eyes: Positive for blurred vision.       Has cataracts with future plans for removal  Respiratory: Positive for cough. Negative for shortness of breath.   Cardiovascular: Negative for chest pain and leg swelling.  Gastrointestinal: Negative for constipation.  Genitourinary: Negative for dysuria.  Musculoskeletal: Positive for joint pain. Negative for falls and myalgias.  Skin: Negative for rash.  Neurological: Negative for dizziness and headaches.  Psychiatric/Behavioral: Positive for depression.    Medications: Patient's Medications  New Prescriptions   No medications on file  Previous Medications   ASPIRIN 325 MG TABLET    Take 325 mg by mouth daily. Take 1 tablet by mouth once daily.   BISACODYL (DULCOLAX) 10 MG SUPPOSITORY    Place 10 mg rectally as needed for constipation. Give 1 rectal suppository every 3 days as needed for no BM per standing order.   CHOLECALCIFEROL 50000 UNITS TABS    Take 50,000 Units by mouth every 30 (thirty) days. Take 1 tablet once every month on the 15th.   DOCUSATE SODIUM (COLACE) 100 MG CAPSULE    Take 100 mg by mouth daily. Take 1 tablet by mouth daily as needed for constipation.   INSULIN GLARGINE (LANTUS) 100 UNIT/ML INJECTION    Inject 20 Units into the skin at bedtime. Inject 20 units subcutaneously at  bedtime for diabetes. Inject 20 units subcutaneously in the morning for diabetes.   LAMOTRIGINE (LAMICTAL) 100 MG TABLET    Take 100 mg by mouth daily. Take 1 tablet daily by mouth for mood disorder.   LORATADINE (CLARITIN) 10 MG TABLET    Take 10 mg by mouth daily. Give 1 tablet every 24 hours as needed for allergies.   METOPROLOL TARTRATE (LOPRESSOR) 25 MG TABLET    Take 25 mg by mouth 2 (two) times daily. Give 1 tablet by mouth twice daily for hypertension.   NAPROXEN SODIUM (ANAPROX) 220 MG TABLET    Take 220 mg by mouth daily. Take 2 tablet by mouth as needed for pain. Should be given with food or milk.   POLYETHYLENE GLYCOL (MIRALAX / GLYCOLAX) PACKET    Take 17 g by mouth daily. Give one 17 gram by mouth once daily for constipation.   PROMETHAZINE (PHENERGAN) 25 MG TABLET    Take 25 mg by mouth every 6 (six) hours as needed for nausea. Give 1 tablet by mouth daily, every 6 hours as needed for nausea.   PROPYLENE GLYCOL (SYSTANE BALANCE) 0.6 % SOLN    Apply to eye 2 (two) times daily. Instill 1 drop to both eyes twice daily.   RANITIDINE (ZANTAC) 150 MG TABLET    Take 150 mg by mouth daily.   SENNA-DOCUSATE (SENOKOT S) 8.6-50 MG PER TABLET  Take 1 tablet by mouth daily. Take 2 tablets by mouth daily for constipation.  Modified Medications   No medications on file  Discontinued Medications   No medications on file    Physical Exam: There were no vitals filed for this visit. Physical Exam  Constitutional: She is oriented to person, place, and time. She appears well-developed and well-nourished. No distress.  Cardiovascular: Normal rate, regular rhythm, normal heart sounds and intact distal pulses.   Pulmonary/Chest: Effort normal and breath sounds normal. No respiratory distress.  Abdominal: Soft. Bowel sounds are normal. She exhibits no distension and no mass. There is no tenderness.  Musculoskeletal: Normal range of motion.  Some chronic crepitus and tenderness of bilateral knees    Neurological: She is alert and oriented to person, place, and time.  Skin: Skin is warm and dry. There is pallor.  Psychiatric: She has a normal mood and affect.    Assessment/Plan 1. Cough -suspect this may be due to GERD, but pt notes no GERD symptoms with zantac use and also may have some postnasal drip (on claritin for allergies) -prn diabetic cough syrup ordered  2. Type II or unspecified type diabetes mellitus without mention of complication, uncontrolled -cont lantus alone which has been effective -f/u hba1c  3. Essential hypertension, benign -bp satisfactory at this time  4. Osteoarthritis -of knees primarily -cont naproxen per her request--have tried tylenol instead due to risk of GI bleed and renal failure, but pt has refused change and also has not benefited from voltaren  5. Unspecified psychosis -cont lamictal as per NCEPS  6. GERD (gastroesophageal reflux disease) -pt feels this is controlled with zantac -if cough persists, would change her to a PPI (also would be protective with chronic nsaid use)  Family/ staff Communication:discussed with nursing staff Goals of care: long term care resident, remains full code  Labs/tests ordered:  Cbc, bmp, hba1c

## 2013-02-16 ENCOUNTER — Encounter: Payer: Self-pay | Admitting: Internal Medicine

## 2013-03-21 DIAGNOSIS — F39 Unspecified mood [affective] disorder: Secondary | ICD-10-CM | POA: Diagnosis not present

## 2013-03-26 DIAGNOSIS — E119 Type 2 diabetes mellitus without complications: Secondary | ICD-10-CM | POA: Diagnosis not present

## 2013-03-26 DIAGNOSIS — H2510 Age-related nuclear cataract, unspecified eye: Secondary | ICD-10-CM | POA: Diagnosis not present

## 2013-05-16 ENCOUNTER — Non-Acute Institutional Stay (SKILLED_NURSING_FACILITY): Payer: Medicare Other | Admitting: Internal Medicine

## 2013-05-16 DIAGNOSIS — L608 Other nail disorders: Secondary | ICD-10-CM | POA: Diagnosis not present

## 2013-05-16 DIAGNOSIS — F0391 Unspecified dementia with behavioral disturbance: Secondary | ICD-10-CM

## 2013-05-16 DIAGNOSIS — K5909 Other constipation: Secondary | ICD-10-CM

## 2013-05-16 DIAGNOSIS — E119 Type 2 diabetes mellitus without complications: Secondary | ICD-10-CM

## 2013-05-16 DIAGNOSIS — M8949 Other hypertrophic osteoarthropathy, multiple sites: Secondary | ICD-10-CM

## 2013-05-16 DIAGNOSIS — K219 Gastro-esophageal reflux disease without esophagitis: Secondary | ICD-10-CM | POA: Diagnosis not present

## 2013-05-16 DIAGNOSIS — M159 Polyosteoarthritis, unspecified: Secondary | ICD-10-CM

## 2013-05-16 DIAGNOSIS — I10 Essential (primary) hypertension: Secondary | ICD-10-CM | POA: Diagnosis not present

## 2013-05-16 DIAGNOSIS — L602 Onychogryphosis: Secondary | ICD-10-CM

## 2013-05-16 DIAGNOSIS — K5900 Constipation, unspecified: Secondary | ICD-10-CM

## 2013-05-16 DIAGNOSIS — M150 Primary generalized (osteo)arthritis: Secondary | ICD-10-CM

## 2013-05-16 DIAGNOSIS — F03918 Unspecified dementia, unspecified severity, with other behavioral disturbance: Secondary | ICD-10-CM

## 2013-05-16 NOTE — Progress Notes (Signed)
Patient ID: Ana Newman, female   DOB: 07-01-1935, 78 y.o.   MRN: [id number]  Provider:  Rexene Edison. Mariea Clonts, D.O., C.M.D.  Location:  [facility]  PCP: Hollace Kinnier, DO  Code Status: FULL CODE  No Known Allergies  Chief Complaint  Patient presents with  . Medical Managment of Chronic Issues    HPI: 78 y.o. female long term care resident seen for med mgt of chronic diseases.  She has AD, htn, constipation, GERD, vit D deficiency, OA, DMI, and cataracts.  She tells me that she has an appt for her cataract surgery on 4/22.  Her only concern is that the podiatrist came and did not trim her nails as she had requested.  They are well overdue.  ROS: Review of Systems  Constitutional: Negative for fever and malaise/fatigue.  Eyes: Positive for blurred vision.  Respiratory: Negative for shortness of breath.   Cardiovascular: Negative for chest pain and leg swelling.  Gastrointestinal: Negative for abdominal pain, constipation, blood in stool and melena.  Genitourinary: Negative for dysuria.  Musculoskeletal: Negative for falls.  Skin: Negative for rash.       Toenails need trimming  Neurological: Negative for dizziness, weakness and headaches.  Endo/Heme/Allergies:       Diabetic  Psychiatric/Behavioral: Positive for memory loss.    Past Medical History  Diagnosis Date  . Reflux esophagitis   . Unspecified constipation   . Essential hypertension, benign   . Alzheimer's disease   . Type II or unspecified type diabetes mellitus without mention of complication, uncontrolled   . Rickets, active   . Unspecified psychosis   . Unspecified vitamin D deficiency   . Osteoarthritis    Past Surgical History  Procedure Laterality Date  . Abdominal hysterectomy    . Joint replacement Left    Social History:   reports that she has never smoked. She does not have any smokeless tobacco history on file. Her alcohol and drug histories are not on file.  No family  history on file.  Medications: Patient's Medications  New Prescriptions   No medications on file  Previous Medications   ACETAMINOPHEN (TYLENOL) 325 MG TABLET    Take 650 mg by mouth every 6 (six) hours as needed.   ALUM & MAG HYDROXIDE-SIMETH (MYLANTA) 825-053-97 MG/5ML SUSPENSION    Take 10 mLs by mouth every 12 (twelve) hours as needed for indigestion or heartburn.   ASPIRIN 325 MG TABLET    Take 325 mg by mouth daily. Take 1 tablet by mouth once daily.   BISACODYL (DULCOLAX) 10 MG SUPPOSITORY    Place 10 mg rectally as needed for constipation. Give 1 rectal suppository every 3 days as needed for no BM per standing order.   CHOLECALCIFEROL 50000 UNITS TABS    Take 50,000 Units by mouth every 30 (thirty) days. Take 1 tablet once every month on the 15th.   DOCUSATE SODIUM (COLACE) 100 MG CAPSULE    Take 100 mg by mouth daily. Take 1 tablet by mouth daily as needed for constipation.   INSULIN GLARGINE (LANTUS) 100 UNIT/ML INJECTION    Inject 20 Units into the skin at bedtime. Inject 20 units subcutaneously at bedtime for diabetes. Inject 20 units subcutaneously in the morning for diabetes.   LAMOTRIGINE (LAMICTAL) 100 MG TABLET    Take 100 mg by mouth daily. Take 1 tablet daily by mouth for mood disorder.   LORATADINE (CLARITIN) 10 MG TABLET    Take 10 mg by mouth daily.  Give 1 tablet every 24 hours as needed for allergies.   MECLIZINE (ANTIVERT) 25 MG TABLET    Take 25 mg by mouth 3 (three) times daily as needed for dizziness.   METOPROLOL TARTRATE (LOPRESSOR) 25 MG TABLET    Take 25 mg by mouth 2 (two) times daily. Give 1 tablet by mouth twice daily for hypertension.   NAPROXEN SODIUM (ANAPROX) 220 MG TABLET    Take 220 mg by mouth daily. Take 2 tablet by mouth as needed for pain. Should be given with food or milk.   POLYETHYLENE GLYCOL (MIRALAX / GLYCOLAX) PACKET    Take 17 g by mouth daily. Give one 17 gram by mouth once daily for constipation.   POLYVINYL ALCOHOL-POVIDONE (FRESHKOTE) 2.7-2  % SOLN    Apply to eye. 1 gtt in both eyes three times a day for cataract   PROMETHAZINE (PHENERGAN) 25 MG TABLET    Take 25 mg by mouth every 6 (six) hours as needed for nausea. Give 1 tablet by mouth daily, every 6 hours as needed for nausea.   PROPYLENE GLYCOL (SYSTANE BALANCE) 0.6 % SOLN    Apply to eye 2 (two) times daily. Instill 1 drop to both eyes twice daily.   RANITIDINE (ZANTAC) 150 MG TABLET    Take 150 mg by mouth daily.   SENNA-DOCUSATE (SENOKOT S) 8.6-50 MG PER TABLET    Take 1 tablet by mouth daily. Take 2 tablets by mouth daily for constipation.  Modified Medications   No medications on file  Discontinued Medications   No medications on file     Physical Exam: Filed Vitals:   05/16/13 1254  BP: 132/77  Pulse: 78  Temp: 98 F (36.7 C)  Resp: 20  SpO2: 97%   Physical Exam  Constitutional: She appears well-developed and well-nourished. No distress.  Cardiovascular: Normal rate, regular rhythm, normal heart sounds and intact distal pulses.   Pulmonary/Chest: Effort normal and breath sounds normal. No respiratory distress.  Abdominal: Soft. Bowel sounds are normal. She exhibits no distension and no mass. There is no tenderness.  Musculoskeletal:  Atrophy of leg muscles, spends all of her time in bed except if she has an appointment  Neurological: She is alert.  Oriented to person and place, not precise time  Skin: Skin is warm and dry. There is pallor.  Has thickened, curling toenails bilaterally  Psychiatric: She has a normal mood and affect.   Labs reviewed: No new labs  Assessment/Plan 1. Diabetes mellitus type II, controlled -cont diet mgt and insulin, follow up hba1c  2. Essential hypertension, benign -bp at goal, no changes, ace stopped due to cough  3. Onychogryphosis -arrange podiatry to cut toenails  4. Gastroesophageal reflux disease without esophagitis -cont prn zantac  5. Primary osteoarthritis involving multiple joints -does fine with  tylneol alone for this  6. Chronic constipation -cont senna s, dulcolax prn, mylanta, colace, miralax, she is relatively immobile and not interested in getting up more even if it helps this problem  7. Dementia with behavioral disturbance -remains on lamictal for mood stability purposes -monitor cbc  Functional status:  Dependent in bathing, dressing, grooming, transfers, able to feed herself   Family/ staff Communication: discussed with her nurse  Labs/tests ordered:  Needs regular hba1c, lipids, cbc, cmp

## 2013-05-17 DIAGNOSIS — M79609 Pain in unspecified limb: Secondary | ICD-10-CM | POA: Diagnosis not present

## 2013-05-17 DIAGNOSIS — E1049 Type 1 diabetes mellitus with other diabetic neurological complication: Secondary | ICD-10-CM | POA: Diagnosis not present

## 2013-05-17 DIAGNOSIS — B351 Tinea unguium: Secondary | ICD-10-CM | POA: Diagnosis not present

## 2013-05-17 DIAGNOSIS — E1142 Type 2 diabetes mellitus with diabetic polyneuropathy: Secondary | ICD-10-CM | POA: Diagnosis not present

## 2013-05-23 DIAGNOSIS — H2510 Age-related nuclear cataract, unspecified eye: Secondary | ICD-10-CM | POA: Diagnosis not present

## 2013-05-23 DIAGNOSIS — R9431 Abnormal electrocardiogram [ECG] [EKG]: Secondary | ICD-10-CM | POA: Diagnosis not present

## 2013-06-07 DIAGNOSIS — H2510 Age-related nuclear cataract, unspecified eye: Secondary | ICD-10-CM | POA: Diagnosis not present

## 2013-06-07 DIAGNOSIS — I10 Essential (primary) hypertension: Secondary | ICD-10-CM | POA: Diagnosis not present

## 2013-06-07 DIAGNOSIS — E119 Type 2 diabetes mellitus without complications: Secondary | ICD-10-CM | POA: Diagnosis not present

## 2013-06-07 DIAGNOSIS — H2589 Other age-related cataract: Secondary | ICD-10-CM | POA: Diagnosis not present

## 2013-06-07 DIAGNOSIS — F329 Major depressive disorder, single episode, unspecified: Secondary | ICD-10-CM | POA: Diagnosis not present

## 2013-06-07 DIAGNOSIS — F3289 Other specified depressive episodes: Secondary | ICD-10-CM | POA: Diagnosis not present

## 2013-06-21 DIAGNOSIS — H2589 Other age-related cataract: Secondary | ICD-10-CM | POA: Diagnosis not present

## 2013-06-21 DIAGNOSIS — E119 Type 2 diabetes mellitus without complications: Secondary | ICD-10-CM | POA: Diagnosis not present

## 2013-07-06 ENCOUNTER — Encounter: Payer: Self-pay | Admitting: Internal Medicine

## 2013-07-18 ENCOUNTER — Encounter: Payer: Self-pay | Admitting: Internal Medicine

## 2013-07-18 ENCOUNTER — Non-Acute Institutional Stay (SKILLED_NURSING_FACILITY): Payer: Medicare Other | Admitting: Internal Medicine

## 2013-07-18 DIAGNOSIS — K117 Disturbances of salivary secretion: Secondary | ICD-10-CM | POA: Diagnosis not present

## 2013-07-18 DIAGNOSIS — I10 Essential (primary) hypertension: Secondary | ICD-10-CM

## 2013-07-18 DIAGNOSIS — H269 Unspecified cataract: Secondary | ICD-10-CM

## 2013-07-18 DIAGNOSIS — R682 Dry mouth, unspecified: Secondary | ICD-10-CM

## 2013-07-18 NOTE — Progress Notes (Signed)
Patient ID: Ana Newman, female   DOB: 10-19-1935, 78 y.o.   MRN: [id number]  Location:  [facility] Provider:  Rexene Edison. Mariea Clonts, D.O., C.M.D.  Code Status:  Full code  Chief Complaint  Patient presents with  . Acute Visit    requested to speak with doctor    HPI:  78 yo white female here for long term care asked to see me today.She notes that her mouth is very dry.  She requests biotene mouthwash.  In process of getting cataracts taken care [facility] at [facility].  No other concerns.    Review of Systems:  Review of Systems  Constitutional: Negative for fever.  HENT:       Dry mouth  Eyes: Positive for blurred vision. Negative for pain, discharge and redness.  Respiratory: Negative for shortness of breath.   Cardiovascular: Negative for chest pain.  Gastrointestinal: Positive for constipation. Negative for abdominal pain.  Genitourinary: Negative for dysuria.  Musculoskeletal: Negative for falls.  Neurological: Negative for dizziness.    Medications: Patient's Medications  New Prescriptions   No medications on file  Previous Medications   ACETAMINOPHEN (TYLENOL) 325 MG TABLET    Take 650 mg by mouth every 6 (six) hours as needed.   ALUM & MAG HYDROXIDE-SIMETH (MYLANTA) 440-102-72 MG/5ML SUSPENSION    Take 10 mLs by mouth every 12 (twelve) hours as needed for indigestion or heartburn.   ASPIRIN 325 MG TABLET    Take 325 mg by mouth daily. Take 1 tablet by mouth once daily.   BISACODYL (DULCOLAX) 10 MG SUPPOSITORY    Place 10 mg rectally as needed for constipation. Give 1 rectal suppository every 3 days as needed for no BM per standing order.   CHOLECALCIFEROL 50000 UNITS TABS    Take 50,000 Units by mouth every 30 (thirty) days. Take 1 tablet once every month on the 15th.   DOCUSATE SODIUM (COLACE) 100 MG CAPSULE    Take 100 mg by mouth daily. Take 1 tablet by mouth daily as needed for constipation.   INSULIN GLARGINE (LANTUS) 100 UNIT/ML INJECTION    Inject 20 Units  into the skin at bedtime. Inject 20 units subcutaneously at bedtime for diabetes. Inject 20 units subcutaneously in the morning for diabetes.   LAMOTRIGINE (LAMICTAL) 100 MG TABLET    Take 100 mg by mouth daily. Take 1 tablet daily by mouth for mood disorder.   LORATADINE (CLARITIN) 10 MG TABLET    Take 10 mg by mouth daily. Give 1 tablet every 24 hours as needed for allergies.   MECLIZINE (ANTIVERT) 25 MG TABLET    Take 25 mg by mouth 3 (three) times daily as needed for dizziness.   METOPROLOL TARTRATE (LOPRESSOR) 25 MG TABLET    Take 25 mg by mouth 2 (two) times daily. Give 1 tablet by mouth twice daily for hypertension.   NAPROXEN SODIUM (ANAPROX) 220 MG TABLET    Take 220 mg by mouth daily. Take 2 tablet by mouth as needed for pain. Should be given with food or milk.   POLYETHYLENE GLYCOL (MIRALAX / GLYCOLAX) PACKET    Take 17 g by mouth daily. Give one 17 gram by mouth once daily for constipation.   POLYVINYL ALCOHOL-POVIDONE (FRESHKOTE) 2.7-2 % SOLN    Apply to eye. 1 gtt in both eyes three times a day for cataract   PROMETHAZINE (PHENERGAN) 25 MG TABLET    Take 25 mg by mouth every 6 (six) hours as needed for nausea.  Give 1 tablet by mouth daily, every 6 hours as needed for nausea.   PROPYLENE GLYCOL (SYSTANE BALANCE) 0.6 % SOLN    Apply to eye 2 (two) times daily. Instill 1 drop to both eyes twice daily.   RANITIDINE (ZANTAC) 150 MG TABLET    Take 150 mg by mouth daily.   SENNA-DOCUSATE (SENOKOT S) 8.6-50 MG PER TABLET    Take 1 tablet by mouth daily. Take 2 tablets by mouth daily for constipation.  Modified Medications   No medications on file  Discontinued Medications   No medications on file    Physical Exam: Filed Vitals:   07/18/13 1753  BP: 138/77  Pulse: 70  Temp: 96.7 F (35.9 C)  Resp: 18  Height: 5\' 2"  (1.575 m)  Weight: 169 lb (76.658 kg)  SpO2: 97%   Physical Exam  Constitutional: She is oriented to person, place, and time. She appears well-developed and  well-nourished. No distress.  Cardiovascular: Normal rate, regular rhythm, normal heart sounds and intact distal pulses.   Pulmonary/Chest: Effort normal and breath sounds normal. No respiratory distress.  Abdominal: Soft. Bowel sounds are normal. She exhibits no distension and no mass. There is no tenderness.  Musculoskeletal: Normal range of motion.  Neurological: She is alert and oriented to person, place, and time.  Skin: Skin is warm and dry. There is pallor.  Psychiatric: She has a normal mood and affect.     Assessment/Plan 1. Dry mouth -biotene mouthwash as needed for dry mouth  2. Cataracts, bilateral -cont to f/u with [facility] for management   3. Essential hypertension, benign -bp at goal with lopressor therapy--? If she could be switched to an ace given her diabetes for renal protection   Family/ staff Communication: discussed with nurse  Goals of care: long term resident, full code

## 2013-07-25 DIAGNOSIS — Z79899 Other long term (current) drug therapy: Secondary | ICD-10-CM | POA: Diagnosis not present

## 2013-07-25 LAB — HEPATIC FUNCTION PANEL
ALT: 23 U/L (ref 7–35)
AST: 17 U/L (ref 13–35)
Alkaline Phosphatase: 61 U/L (ref 25–125)
Bilirubin, Total: 0.2 mg/dL

## 2013-07-25 LAB — BASIC METABOLIC PANEL
BUN: 20 mg/dL (ref 4–21)
CREATININE: 0.8 mg/dL (ref <=1.1)
GLUCOSE: 83 mg/dL
Potassium: 4.6 mmol/L (ref 3.4–5.3)
Sodium: 137 mmol/L (ref 137–147)

## 2013-07-25 LAB — CBC AND DIFFERENTIAL
HEMATOCRIT: 42 % (ref 36–46)
HEMOGLOBIN: 12.9 g/dL (ref 12.0–16.0)
PLATELETS: 295 10*3/uL (ref 150–399)
WBC: 8.1 10*3/mL

## 2013-07-25 LAB — HEMOGLOBIN A1C: HEMOGLOBIN A1C: 6.1 % — AB (ref 4.0–6.0)

## 2013-07-25 LAB — LIPID PANEL
Cholesterol: 180 mg/dL (ref 0–200)
HDL: 34 mg/dL — AB (ref 35–70)
Triglycerides: 239 mg/dL — AB (ref 40–160)

## 2013-08-21 DIAGNOSIS — F39 Unspecified mood [affective] disorder: Secondary | ICD-10-CM | POA: Diagnosis not present

## 2013-09-04 DIAGNOSIS — F39 Unspecified mood [affective] disorder: Secondary | ICD-10-CM | POA: Diagnosis not present

## 2013-09-12 ENCOUNTER — Encounter: Payer: Self-pay | Admitting: Internal Medicine

## 2013-09-12 ENCOUNTER — Non-Acute Institutional Stay (SKILLED_NURSING_FACILITY): Payer: Medicare Other | Admitting: Internal Medicine

## 2013-09-12 DIAGNOSIS — F0391 Unspecified dementia with behavioral disturbance: Secondary | ICD-10-CM

## 2013-09-12 DIAGNOSIS — K5900 Constipation, unspecified: Secondary | ICD-10-CM

## 2013-09-12 DIAGNOSIS — F03918 Unspecified dementia, unspecified severity, with other behavioral disturbance: Secondary | ICD-10-CM | POA: Diagnosis not present

## 2013-09-12 DIAGNOSIS — M150 Primary generalized (osteo)arthritis: Secondary | ICD-10-CM

## 2013-09-12 DIAGNOSIS — M159 Polyosteoarthritis, unspecified: Secondary | ICD-10-CM | POA: Diagnosis not present

## 2013-09-12 DIAGNOSIS — E119 Type 2 diabetes mellitus without complications: Secondary | ICD-10-CM

## 2013-09-12 DIAGNOSIS — K219 Gastro-esophageal reflux disease without esophagitis: Secondary | ICD-10-CM | POA: Diagnosis not present

## 2013-09-12 DIAGNOSIS — I10 Essential (primary) hypertension: Secondary | ICD-10-CM

## 2013-09-12 DIAGNOSIS — M8949 Other hypertrophic osteoarthropathy, multiple sites: Secondary | ICD-10-CM

## 2013-09-12 DIAGNOSIS — K5909 Other constipation: Secondary | ICD-10-CM

## 2013-09-12 NOTE — Progress Notes (Signed)
Patient ID: Ana Newman, female   DOB: 06/03/1935, 78 y.o.   MRN: [id number]  Location:  Location:  [facility] Provider:  Rexene Edison. Mariea Clonts, D.O., C.M.D.  Code Status:  Full  Chief Complaint  Patient presents with  . Medical Management of Chronic Issues    HPI:  78 yo white female long term care resident was seen for med mgt of chronic diseases.  She tells me she wants to do therapy again.  She spends all of her time in bed except to get her hair done and go out to appts.    Reviewing her meds, I noted she is not on an ace/arb and is diabetic.  Her bp runs in 130s and should tolerate it.  We will check her urine microalbumin first and she will need to be informed if this is started.    Review of Systems:  Review of Systems  Constitutional: Negative for malaise/fatigue.  HENT: Negative for congestion.   Eyes: Positive for blurred vision.  Respiratory: Negative for shortness of breath.   Cardiovascular: Negative for chest pain.  Gastrointestinal: Negative for abdominal pain, constipation, blood in stool and melena.  Genitourinary: Negative for dysuria.  Musculoskeletal: Negative for falls.  Neurological: Positive for weakness. Negative for dizziness.  Psychiatric/Behavioral: Negative for depression.    Medications: Patient's Medications  New Prescriptions   No medications on file  Previous Medications   ACETAMINOPHEN (TYLENOL) 325 MG TABLET    Take 650 mg by mouth every 6 (six) hours as needed.   ALUM & MAG HYDROXIDE-SIMETH (MYLANTA) 093-235-57 MG/5ML SUSPENSION    Take 10 mLs by mouth every 12 (twelve) hours as needed for indigestion or heartburn.   ANTISEPTIC ORAL RINSE (BIOTENE) LIQD    15 mLs by Mouth Rinse route as needed for dry mouth.   ASPIRIN 325 MG TABLET    Take 325 mg by mouth daily. Take 1 tablet by mouth once daily.   BISACODYL (DULCOLAX) 10 MG SUPPOSITORY    Place 10 mg rectally as needed for constipation. Give 1 rectal suppository every 3 days  as needed for no BM per standing order.   CHOLECALCIFEROL (VITAMIN D) 1000 UNITS TABLET    Take 2,000 Units by mouth daily.   DOCUSATE SODIUM (COLACE) 100 MG CAPSULE    Take 100 mg by mouth daily. Take 1 tablet by mouth daily as needed for constipation.   INSULIN GLARGINE (LANTUS) 100 UNIT/ML INJECTION    Inject 20 units subcutaneously at bedtime for diabetes. Inject 20 units subcutaneously in the morning for diabetes.   LAMOTRIGINE (LAMICTAL) 100 MG TABLET    Take 50 mg by mouth daily. Take 1 tablet daily by mouth for mood disorder.   LORATADINE (CLARITIN) 10 MG TABLET    Take 10 mg by mouth daily. Give 1 tablet every 24 hours as needed for allergies.   MECLIZINE (ANTIVERT) 25 MG TABLET    Take 25 mg by mouth 3 (three) times daily as needed for dizziness.   METOPROLOL TARTRATE (LOPRESSOR) 25 MG TABLET    Take 25 mg by mouth 2 (two) times daily. Give 1 tablet by mouth twice daily for hypertension.   POLYETHYLENE GLYCOL (MIRALAX / GLYCOLAX) PACKET    Take 17 g by mouth daily. Give one 17 gram by mouth once daily for constipation.   PROMETHAZINE (PHENERGAN) 25 MG TABLET    Take 25 mg by mouth every 6 (six) hours as needed for nausea. Give 1 tablet by mouth daily, every  6 hours as needed for nausea.   RANITIDINE (ZANTAC) 150 MG TABLET    Take 150 mg by mouth daily.   SENNA-DOCUSATE (SENOKOT S) 8.6-50 MG PER TABLET    Take 1 tablet by mouth daily. Take 2 tablets by mouth daily for constipation.  Modified Medications   No medications on file  Discontinued Medications   CHOLECALCIFEROL 50000 UNITS TABS    Take 50,000 Units by mouth every 30 (thirty) days. Take 1 tablet once every month on the 15th.   NAPROXEN SODIUM (ANAPROX) 220 MG TABLET    Take 220 mg by mouth daily. Take 2 tablet by mouth as needed for pain. Should be given with food or milk.   POLYVINYL ALCOHOL-POVIDONE (FRESHKOTE) 2.7-2 % SOLN    Apply to eye. 1 gtt in both eyes three times a day for cataract   PROPYLENE GLYCOL (SYSTANE BALANCE)  0.6 % SOLN    Apply to eye 2 (two) times daily. Instill 1 drop to both eyes twice daily.    Physical Exam: Filed Vitals:   09/12/13 1540  BP: 128/77  Pulse: 78  Temp: 98.4 F (36.9 C)  Resp: 20  Height: 5\' 2"  (1.575 m)  Weight: 167 lb (75.751 kg)  SpO2: 98%  Physical Exam  Constitutional: She is oriented to person, place, and time. She appears well-developed and well-nourished. No distress.  Cardiovascular: Normal rate, regular rhythm, normal heart sounds and intact distal pulses.   Pulmonary/Chest: Effort normal and breath sounds normal. No respiratory distress.  Abdominal: Soft. Bowel sounds are normal. She exhibits no distension and no mass. There is no tenderness.  Musculoskeletal:  Has atrophy of LE muscles  Neurological: She is alert and oriented to person, place, and time.  Skin: There is pallor.  Psychiatric:  Flat affect, typically answers 1-2 questions and then is done seeing me     Labs reviewed: Basic Metabolic Panel:  Recent Labs  07/25/13  NA 137  K 4.6  BUN 20  CREATININE 0.8    Liver Function Tests:  Recent Labs  07/25/13  AST 17  ALT 23  ALKPHOS 61    CBC:  Recent Labs  07/25/13  WBC 8.1  HGB 12.9  HCT 42  PLT 295   Assessment/Plan 1. Diabetes mellitus type II, controlled -cont concho diet and lantus regimen, asa -last hba1c at goal, recheck this and lipids -check urine microalbumin and add ace if elevated--bp should tolerate as runs in 761P systolic and resident also does not get up often at all  2. Essential hypertension, benign -cont lopressor, plan to add ace if she has proteinuria  3. Gastroesophageal reflux disease without esophagitis -cont zantac, prn mylanta  4. Primary osteoarthritis involving multiple joints -cont tylenol for pain, could add voltaren if she c/o this  5. Chronic constipation -con colace, mylanta, dulcolax prn, senna s and miralax -did discuss with her that drinking plenty of water and actually getting  up out of bed would help with BMs so she did not have to take bowel regimen so much  6. Dementia with behavioral disturbance -unclear what her baseline psychiatric condition was--mood has been good past several visits--she is on lamictal for mood stabilization and her memory is not what it used to be--she is not on memory meds and I'm not clear that she would benefit--seems she may be more bipolar or schizoaffective  Family/ staff Communication: discussed with nurse  Goals of care: long term care, full code  Labs/tests ordered:  Urine microalbumin

## 2013-11-18 ENCOUNTER — Encounter: Payer: Self-pay | Admitting: Internal Medicine

## 2013-12-05 ENCOUNTER — Encounter: Payer: Self-pay | Admitting: Internal Medicine

## 2013-12-05 ENCOUNTER — Non-Acute Institutional Stay (SKILLED_NURSING_FACILITY): Payer: Medicare Other | Admitting: Internal Medicine

## 2013-12-05 DIAGNOSIS — K219 Gastro-esophageal reflux disease without esophagitis: Secondary | ICD-10-CM

## 2013-12-05 DIAGNOSIS — E119 Type 2 diabetes mellitus without complications: Secondary | ICD-10-CM

## 2013-12-05 DIAGNOSIS — F29 Unspecified psychosis not due to a substance or known physiological condition: Secondary | ICD-10-CM | POA: Diagnosis not present

## 2013-12-05 DIAGNOSIS — I10 Essential (primary) hypertension: Secondary | ICD-10-CM | POA: Diagnosis not present

## 2013-12-05 NOTE — Progress Notes (Signed)
Patient ID: Ana Newman, female   DOB: 06/17/1935, 78 y.o.   MRN: [id number]   Place of Service: [facility]  No Known Allergies  Code Status: Full Code  Goals of Care: Longevity/Long term care  Chief Complaint  Patient presents with  . Medical Management of Chronic Issues    DM2, constipation, GERD, psychosis     HPI 78 y.o. female with PMH of diabetes, HTN, constipation, psychosis, GERD among others is being seen for a routine visit. Weight stable. No recent falls or skin concerns reported. No change in behaviors or functional status. No concerns from nursing staff. No complaint verbalized from resident.   Review of Systems Constitutional: Negative for fever, chills, HENT: Negative for ear pain, congestion, and sore throat Eyes: Negative for eye discharge and visual disturbance  Cardiovascular: Negative for chest pain, palpitations, and leg swelling Respiratory: Negative cough, shortness of breath, and wheezing.  Gastrointestinal: Negative for nausea and vomiting. Negative for abdominal pain, diarrhea and constipation.  Musculoskeletal: Negative for back pain, joint pain, and joint swelling  Neurological: Negative for dizziness and headache Skin: Negative for rash and wound.   Psychiatric: Negative for nervous/anxious, agitation, and depression  Past Medical History  Diagnosis Date  . Reflux esophagitis   . Unspecified constipation   . Essential hypertension, benign   . Alzheimer's disease   . Type II or unspecified type diabetes mellitus without mention of complication, uncontrolled   . Rickets, active   . Unspecified psychosis   . Unspecified vitamin D deficiency   . Osteoarthritis     Past Surgical History  Procedure Laterality Date  . Abdominal hysterectomy    . Joint replacement Left     History   Social History  . Marital Status: Married    Spouse Name: N/A    Number of Children: N/A  . Years of Education: N/A   Occupational  History  . Not on file.   Social History Main Topics  . Smoking status: Never Smoker   . Smokeless tobacco: Not on file  . Alcohol Use: Not on file  . Drug Use: Not on file  . Sexual Activity: Not on file   Other Topics Concern  . Not on file   Social History Narrative      Medication List       This list is accurate as of: 12/05/13  3:08 PM.  Always use your most recent med list.               acetaminophen 325 MG tablet  Commonly known as:  TYLENOL  Take 650 mg by mouth every 6 (six) hours as needed.     acetaminophen 500 MG tablet  Commonly known as:  TYLENOL  Take 1,000 mg by mouth daily.     antiseptic oral rinse Liqd  15 mLs by Mouth Rinse route as needed for dry mouth.     aspirin 325 MG tablet  Take 325 mg by mouth daily. Take 1 tablet by mouth once daily.     cholecalciferol 1000 UNITS tablet  Commonly known as:  VITAMIN D  Take 2,000 Units by mouth daily.     docusate sodium 100 MG capsule  Commonly known as:  COLACE  Take 100 mg by mouth daily. Take 1 tablet by mouth daily as needed for constipation.     DULCOLAX 10 MG suppository  Generic drug:  bisacodyl  Place 10 mg rectally as needed for constipation. Give 1 rectal suppository every 3  days as needed for no BM per standing order.     FRESHKOTE 2.7-2 % Soln  Generic drug:  Polyvinyl Alcohol-Povidone  Apply 1 drop to eye 3 (three) times daily. To both eyes     LAMICTAL 100 MG tablet  Generic drug:  lamoTRIgine  Take 50 mg by mouth daily. Take 1 tablet daily by mouth for mood disorder.     LANTUS 100 UNIT/ML injection  Generic drug:  insulin glargine  - Inject 20 units subcutaneously at bedtime for diabetes.  - Inject 25 units subcutaneously in the morning for diabetes.     lisinopril 2.5 MG tablet  Commonly known as:  PRINIVIL,ZESTRIL  Take 2.5 mg by mouth daily.     loratadine 10 MG tablet  Commonly known as:  CLARITIN  Take 10 mg by mouth daily. Give 1 tablet every 24 hours as  needed for allergies.     meclizine 25 MG tablet  Commonly known as:  ANTIVERT  Take 25 mg by mouth 3 (three) times daily as needed for dizziness.     metoprolol tartrate 25 MG tablet  Commonly known as:  LOPRESSOR  Take 25 mg by mouth 2 (two) times daily. Give 1 tablet by mouth twice daily for hypertension.     MYLANTA 200-200-20 MG/5ML suspension  Generic drug:  alum & mag hydroxide-simeth  Take 10 mLs by mouth every 12 (twelve) hours as needed for indigestion or heartburn.     polyethylene glycol packet  Commonly known as:  MIRALAX / GLYCOLAX  Take 17 g by mouth daily. Give one 17 gram by mouth once daily for constipation.     promethazine 25 MG tablet  Commonly known as:  PHENERGAN  Take 25 mg by mouth every 6 (six) hours as needed for nausea. Give 1 tablet by mouth daily, every 6 hours as needed for nausea.     ranitidine 150 MG tablet  Commonly known as:  ZANTAC  Take 150 mg by mouth daily.     SENOKOT S 8.6-50 MG per tablet  Generic drug:  senna-docusate  Take 1 tablet by mouth daily. Take 2 tablets by mouth daily for constipation.        Physical Exam Filed Vitals:   12/05/13 1457  BP: 132/78  Pulse: 80  Temp: 98.9 F (37.2 C)  Resp: 20   Constitutional: WDWN elderly female in no acute distress. Conversant.  HEENT: Normocephalic and atraumatic. PERRL. EOM intact. No icterus.  Oral mucosa moist. Posterior pharynx clear of any exudate or lesions.  Neck: Supple and nontender. No JVD or carotid bruits. Cardiac: Normal S1, S2. RRR without appreciable murmurs, rubs, or gallops. Distal pulses intact. No dependent edema.  Lungs: No respiratory distress. Breath sounds clear bilaterally without rales, rhonchi, or wheezes. Abdomen: Audible bowel sounds in all quadrants. Soft, nontender, nondistended. No palpable mass.  Musculoskeletal: able to move all extremities. L foot drop noted.   Skin: Warm and dry. No rash noted. No erythema.  Neurological: Alert and oriented to  person, place, and time.  Psychiatric:  Appropriate mood and affect.   Labs Reviewed CBC Latest Ref Rng 07/25/2013  WBC - 8.1  Hemoglobin 12.0 - 16.0 g/dL 12.9  Hematocrit 36 - 46 % 42  Platelets 150 - 399 K/L 295    CMP     Component Value Date/Time   NA 137 07/25/2013   K 4.6 07/25/2013   BUN 20 07/25/2013   CREATININE 0.8 07/25/2013   AST 17 07/25/2013  ALT 23 07/25/2013   ALKPHOS 61 07/25/2013    Lab Results  Component Value Date   HGBA1C 6.1* 07/25/2013   Lipid Panel     Component Value Date/Time   CHOL 180 07/25/2013   TRIG 239* 07/25/2013   HDL 34* 07/25/2013  LDL- 99  Assessment & Plan 1. Diabetes mellitus type II, controlled Stable. CBGs range from 80s to 110s. Continue lantus 25units in the morning and 20 units at bedtime. Continue aspirin and ACEI. Not on statin-lipid is diet-controlled, last LDL is 99. Will check urine microalbumin/creatine ratio. Continue to monitor.   2. Essential hypertension, benign Stable. Continue lisinopril 2.5mg  daily and metoprolol 25mg  twice daily. Continue to monitor  3. Gastroesophageal reflux disease without esophagitis Stable. Continue zantac 150mg  daily and monitor.   4. Psychosis, unspecified psychosis type Stable. Continue lamictal 50mg  daily and monitor for change in behaviors. Continue fall risk precautions.    Labs Ordered: urine microalbumin/creatine ratio  Family/Staff Communication Plan of care discuss with resident and professional staff members. Resident and professional staff members verbalize understanding and agree with plan of care. No additional questions or concerns reported.    Arthur Holms, MSN, AGNP-C [facility] [address] [place], [place] [address] [phone] [8am-5pm] After hours: [phone]

## 2014-01-07 DIAGNOSIS — F39 Unspecified mood [affective] disorder: Secondary | ICD-10-CM | POA: Diagnosis not present

## 2014-01-14 ENCOUNTER — Non-Acute Institutional Stay (SKILLED_NURSING_FACILITY): Payer: Medicare Other | Admitting: Internal Medicine

## 2014-01-14 ENCOUNTER — Encounter: Payer: Self-pay | Admitting: Internal Medicine

## 2014-01-14 DIAGNOSIS — G4700 Insomnia, unspecified: Secondary | ICD-10-CM

## 2014-01-14 DIAGNOSIS — F29 Unspecified psychosis not due to a substance or known physiological condition: Secondary | ICD-10-CM

## 2014-01-14 DIAGNOSIS — F329 Major depressive disorder, single episode, unspecified: Secondary | ICD-10-CM

## 2014-01-14 DIAGNOSIS — E860 Dehydration: Secondary | ICD-10-CM | POA: Diagnosis not present

## 2014-01-14 DIAGNOSIS — I10 Essential (primary) hypertension: Secondary | ICD-10-CM | POA: Diagnosis not present

## 2014-01-14 DIAGNOSIS — E039 Hypothyroidism, unspecified: Secondary | ICD-10-CM

## 2014-01-14 DIAGNOSIS — E119 Type 2 diabetes mellitus without complications: Secondary | ICD-10-CM

## 2014-01-14 DIAGNOSIS — F32A Depression, unspecified: Secondary | ICD-10-CM

## 2014-01-14 NOTE — Progress Notes (Signed)
[facility]  PCP: Cordella Register. Eulas Post DO, FACOI  Code Status: Full  No Known Allergies  Chief Complaint  Patient presents with  . Medical Management of Chronic Issues     HPI:  78 yo female seen today for routine visit. She has no concerns. Eating and sleeping well. No falls. She is looking forward to spending time with her family during Christmas. No nursing issues Review of Systems:  Constitutional: Negative for fever, chills, weight loss, malaise/fatigue and diaphoresis.  HENT: Negative for congestion, hearing loss, earache and sore throat.   Eyes: Negative for eye pain, blurred vision, double vision and discharge.  Respiratory: Negative for cough, sputum production, shortness of breath and wheezing.   Cardiovascular: Negative for chest pain, palpitations, orthopnea and leg swelling.  Gastrointestinal: Negative for heartburn, nausea, vomiting, abdominal pain, melena, rectal bleed, diarrhea and constipation. appetite is excellent Genitourinary: Negative for dysuria, urgency, frequency, hematuria, nocturia and flank pain.  Musculoskeletal: Negative for back pain, falls, joint pain and myalgias. assistive device used wheelchair Skin: Negative for itching, sores and rash.  Neurological: Negative for  New weakness,dizziness, tingling, focal weakness and headaches.  Psychiatric/Behavioral: Negative for depression, insomnia and memory loss. The patient is not nervous/anxious.    Last eye exam on 02/13/13;  Flu shot given 11/07/13 Refused podiatry eval x 2  Past Medical History  Diagnosis Date  . Reflux esophagitis   . Unspecified constipation   . Essential hypertension, benign   . Alzheimer's disease   . Type II or unspecified type diabetes mellitus without mention of complication, uncontrolled   . Rickets, active   . Unspecified psychosis   . Unspecified vitamin D deficiency   . Osteoarthritis    Past Surgical History  Procedure Laterality Date  .  Abdominal hysterectomy    . Joint replacement Left    Social History:   reports that she has never smoked. She does not have any smokeless tobacco history on file. Her alcohol and drug histories are not on file.  History reviewed. No pertinent family history.  Medications: Patient's Medications  New Prescriptions   No medications on file  Previous Medications   ACETAMINOPHEN (TYLENOL) 325 MG TABLET    Take 650 mg by mouth every 6 (six) hours as needed.   ACETAMINOPHEN (TYLENOL) 500 MG TABLET    Take 1,000 mg by mouth daily.   ALUM & MAG HYDROXIDE-SIMETH (MYLANTA) 546-503-54 MG/5ML SUSPENSION    Take 10 mLs by mouth every 12 (twelve) hours as needed for indigestion or heartburn.   ANTISEPTIC ORAL RINSE (BIOTENE) LIQD    15 mLs by Mouth Rinse route as needed for dry mouth.   ARTIFICIAL TEAR OINTMENT (ARTIFICIAL TEARS) OINTMENT    Place 1 drop into both eyes as needed (for dry eyes).   ASPIRIN 325 MG TABLET    Take 325 mg by mouth daily. Take 1 tablet by mouth once daily.   BISACODYL (DULCOLAX) 10 MG SUPPOSITORY    Place 10 mg rectally as needed for constipation. Give 1 rectal suppository every 3 days as needed for no BM per standing order.   CHOLECALCIFEROL (VITAMIN D) 1000 UNITS TABLET    Take 2,000 Units by mouth daily.   DOCUSATE SODIUM (COLACE) 100 MG CAPSULE    Take 100 mg by mouth daily. Take 1 tablet by mouth daily as needed for constipation.   INSULIN GLARGINE (LANTUS) 100 UNIT/ML INJECTION    Inject 20 units subcutaneously at bedtime for diabetes. Inject 25 units  subcutaneously in the morning for diabetes.   LAMOTRIGINE (LAMICTAL) 100 MG TABLET    Take 50 mg by mouth daily. Take 1 tablet daily by mouth for mood disorder.   LISINOPRIL (PRINIVIL,ZESTRIL) 2.5 MG TABLET    Take 2.5 mg by mouth daily.   LORATADINE (CLARITIN) 10 MG TABLET    Take 10 mg by mouth daily. Give 1 tablet every 24 hours as needed for allergies.   MECLIZINE (ANTIVERT) 25 MG TABLET    Take 25 mg by mouth 3 (three)  times daily as needed for dizziness.   METOPROLOL TARTRATE (LOPRESSOR) 25 MG TABLET    Take 25 mg by mouth 2 (two) times daily. Give 1 tablet by mouth twice daily for hypertension.   POLYETHYLENE GLYCOL (MIRALAX / GLYCOLAX) PACKET    Take 17 g by mouth daily. Give one 17 gram by mouth once daily for constipation.   POLYVINYL ALCOHOL-POVIDONE (FRESHKOTE) 2.7-2 % SOLN    Apply 1 drop to eye 3 (three) times daily. To both eyes   PROMETHAZINE (PHENERGAN) 25 MG TABLET    Take 25 mg by mouth every 6 (six) hours as needed for nausea. Give 1 tablet by mouth daily, every 6 hours as needed for nausea.   RANITIDINE (ZANTAC) 150 MG TABLET    Take 150 mg by mouth daily.   SENNA-DOCUSATE (SENOKOT S) 8.6-50 MG PER TABLET    Take 1 tablet by mouth daily. Take 2 tablets by mouth daily for constipation.  Modified Medications   No medications on file  Discontinued Medications   No medications on file     Physical Exam: CONSTITUTIONAL: Looks well in NAD. Awake, alert and oriented x 3. Lying in bed HEENT: PERRLA. Oropharynx clear and without exudate NECK: Supple. Nontender. No palpable cervical or supraclavicular lymph nodes. No carotid bruit b/l. No thyromegaly or thyroid mass palpable.  CVS: Regular rate without murmur, gallop or rub. LUNGS: left basilar end expiratory wheezing but no rales or rhonchi. Right lung clear ABDOMEN: Bowel sounds present x 4. Soft, nontender, nondistended. No palpable mass or bruit EXTREMITIES: No edema b/l. Distal pulses palpable. No calf tenderness PSYCH: Affect, behavior and mood normal Filed Vitals:   01/14/14 1246  BP: 123/79  Pulse: 77  Temp: 96.2 F (35.7 C)  Weight: 167 lb (75.751 kg)  SpO2: 99%        Labs reviewed: Basic Metabolic Panel:  Recent Labs  07/25/13  NA 137  K 4.6  BUN 20  CREATININE 0.8   Liver Function Tests:  Recent Labs  07/25/13  AST 17  ALT 23  ALKPHOS 61   No results for input(s): LIPASE, AMYLASE in the last 8760 hours. No  results for input(s): AMMONIA in the last 8760 hours. CBC:  Recent Labs  07/25/13  WBC 8.1  HGB 12.9  HCT 42  PLT 295   CBG: Usually 100-110s; occasionally 90s and rarely >120  HgA1c (07/2013): 6.1%  LIPID PANEL (07/2013): Tchol 180, TG 239, HDL 34 and LDL 99  Urine Micro/Cr ratio (12/06/13): 94.4  Assessment/Plan  Diabetes mellitus type II, controlled  Psychosis, unspecified psychosis type  Essential hypertension, benign  Depression  Insomnia  Hypothyroidism, unspecified hypothyroidism type - asymptomatic; takes no medication  Dehydration - encourage po intake  She is stable on current regimen.     Family/ staff Communication: discussed POC with nursing   Labs/tests ordered: A1c, lipid panel, CMP     Midori Dado S. Eulas Post, D. O., F. [place]  [facility] [address] [place], [place] [address] [phone] Office (Wednesdays and Fridays 8 AM - 5 PM) [phone] Cell (Monday-Friday 8 AM - 5 PM)

## 2014-01-15 DIAGNOSIS — E1165 Type 2 diabetes mellitus with hyperglycemia: Secondary | ICD-10-CM | POA: Diagnosis not present

## 2014-01-15 DIAGNOSIS — Z79899 Other long term (current) drug therapy: Secondary | ICD-10-CM | POA: Diagnosis not present

## 2014-01-15 DIAGNOSIS — I10 Essential (primary) hypertension: Secondary | ICD-10-CM | POA: Diagnosis not present

## 2014-01-15 DIAGNOSIS — E119 Type 2 diabetes mellitus without complications: Secondary | ICD-10-CM | POA: Diagnosis not present

## 2014-01-15 DIAGNOSIS — E784 Other hyperlipidemia: Secondary | ICD-10-CM | POA: Diagnosis not present

## 2014-01-15 DIAGNOSIS — E785 Hyperlipidemia, unspecified: Secondary | ICD-10-CM | POA: Diagnosis not present

## 2014-01-15 LAB — HEMOGLOBIN A1C: HEMOGLOBIN A1C: 6.2 % — AB (ref 4.0–6.0)

## 2014-01-18 DIAGNOSIS — E039 Hypothyroidism, unspecified: Secondary | ICD-10-CM | POA: Insufficient documentation

## 2014-01-18 DIAGNOSIS — G4700 Insomnia, unspecified: Secondary | ICD-10-CM | POA: Insufficient documentation

## 2014-01-18 DIAGNOSIS — E860 Dehydration: Secondary | ICD-10-CM | POA: Insufficient documentation

## 2014-01-18 DIAGNOSIS — F329 Major depressive disorder, single episode, unspecified: Secondary | ICD-10-CM | POA: Insufficient documentation

## 2014-01-18 DIAGNOSIS — F32A Depression, unspecified: Secondary | ICD-10-CM | POA: Insufficient documentation

## 2014-02-01 DIAGNOSIS — K219 Gastro-esophageal reflux disease without esophagitis: Secondary | ICD-10-CM | POA: Diagnosis not present

## 2014-02-01 DIAGNOSIS — G309 Alzheimer's disease, unspecified: Secondary | ICD-10-CM | POA: Diagnosis not present

## 2014-02-01 DIAGNOSIS — E119 Type 2 diabetes mellitus without complications: Secondary | ICD-10-CM | POA: Diagnosis not present

## 2014-02-01 DIAGNOSIS — E875 Hyperkalemia: Secondary | ICD-10-CM | POA: Diagnosis not present

## 2014-02-01 DIAGNOSIS — I10 Essential (primary) hypertension: Secondary | ICD-10-CM | POA: Diagnosis not present

## 2014-02-01 DIAGNOSIS — M79671 Pain in right foot: Secondary | ICD-10-CM | POA: Diagnosis not present

## 2014-02-01 DIAGNOSIS — M150 Primary generalized (osteo)arthritis: Secondary | ICD-10-CM | POA: Diagnosis not present

## 2014-02-01 DIAGNOSIS — E1165 Type 2 diabetes mellitus with hyperglycemia: Secondary | ICD-10-CM | POA: Diagnosis not present

## 2014-02-01 DIAGNOSIS — F0391 Unspecified dementia with behavioral disturbance: Secondary | ICD-10-CM | POA: Diagnosis not present

## 2014-02-01 DIAGNOSIS — H3531 Nonexudative age-related macular degeneration: Secondary | ICD-10-CM | POA: Diagnosis not present

## 2014-02-01 DIAGNOSIS — F39 Unspecified mood [affective] disorder: Secondary | ICD-10-CM | POA: Diagnosis not present

## 2014-02-01 DIAGNOSIS — F29 Unspecified psychosis not due to a substance or known physiological condition: Secondary | ICD-10-CM | POA: Diagnosis not present

## 2014-02-01 DIAGNOSIS — G301 Alzheimer's disease with late onset: Secondary | ICD-10-CM | POA: Diagnosis not present

## 2014-02-01 DIAGNOSIS — F0390 Unspecified dementia without behavioral disturbance: Secondary | ICD-10-CM | POA: Diagnosis not present

## 2014-02-01 DIAGNOSIS — Z961 Presence of intraocular lens: Secondary | ICD-10-CM | POA: Diagnosis not present

## 2014-02-01 DIAGNOSIS — H04123 Dry eye syndrome of bilateral lacrimal glands: Secondary | ICD-10-CM | POA: Diagnosis not present

## 2014-02-01 DIAGNOSIS — E039 Hypothyroidism, unspecified: Secondary | ICD-10-CM | POA: Diagnosis not present

## 2014-02-01 DIAGNOSIS — R05 Cough: Secondary | ICD-10-CM | POA: Diagnosis not present

## 2014-02-01 DIAGNOSIS — Z794 Long term (current) use of insulin: Secondary | ICD-10-CM | POA: Diagnosis not present

## 2014-02-01 DIAGNOSIS — E785 Hyperlipidemia, unspecified: Secondary | ICD-10-CM | POA: Diagnosis not present

## 2014-02-01 DIAGNOSIS — F329 Major depressive disorder, single episode, unspecified: Secondary | ICD-10-CM | POA: Diagnosis not present

## 2014-02-01 DIAGNOSIS — D649 Anemia, unspecified: Secondary | ICD-10-CM | POA: Diagnosis not present

## 2014-02-01 DIAGNOSIS — M79674 Pain in right toe(s): Secondary | ICD-10-CM | POA: Diagnosis not present

## 2014-02-01 DIAGNOSIS — M79675 Pain in left toe(s): Secondary | ICD-10-CM | POA: Diagnosis not present

## 2014-02-01 DIAGNOSIS — F23 Brief psychotic disorder: Secondary | ICD-10-CM | POA: Diagnosis not present

## 2014-02-01 DIAGNOSIS — Z79899 Other long term (current) drug therapy: Secondary | ICD-10-CM | POA: Diagnosis not present

## 2014-02-01 DIAGNOSIS — E169 Disorder of pancreatic internal secretion, unspecified: Secondary | ICD-10-CM | POA: Diagnosis not present

## 2014-02-01 DIAGNOSIS — F0280 Dementia in other diseases classified elsewhere without behavioral disturbance: Secondary | ICD-10-CM | POA: Diagnosis not present

## 2014-02-01 DIAGNOSIS — H81399 Other peripheral vertigo, unspecified ear: Secondary | ICD-10-CM | POA: Diagnosis not present

## 2014-02-01 DIAGNOSIS — J208 Acute bronchitis due to other specified organisms: Secondary | ICD-10-CM | POA: Diagnosis not present

## 2014-02-01 DIAGNOSIS — M79672 Pain in left foot: Secondary | ICD-10-CM | POA: Diagnosis not present

## 2014-02-01 DIAGNOSIS — B351 Tinea unguium: Secondary | ICD-10-CM | POA: Diagnosis not present

## 2014-02-12 ENCOUNTER — Non-Acute Institutional Stay (SKILLED_NURSING_FACILITY): Payer: Medicare Other | Admitting: Adult Health

## 2014-02-12 DIAGNOSIS — E785 Hyperlipidemia, unspecified: Secondary | ICD-10-CM | POA: Diagnosis not present

## 2014-02-12 DIAGNOSIS — I10 Essential (primary) hypertension: Secondary | ICD-10-CM

## 2014-02-25 ENCOUNTER — Non-Acute Institutional Stay (SKILLED_NURSING_FACILITY): Payer: Medicare Other | Admitting: Internal Medicine

## 2014-02-25 DIAGNOSIS — E119 Type 2 diabetes mellitus without complications: Secondary | ICD-10-CM | POA: Diagnosis not present

## 2014-02-25 DIAGNOSIS — J208 Acute bronchitis due to other specified organisms: Secondary | ICD-10-CM | POA: Diagnosis not present

## 2014-02-25 DIAGNOSIS — F0391 Unspecified dementia with behavioral disturbance: Secondary | ICD-10-CM | POA: Diagnosis not present

## 2014-02-25 DIAGNOSIS — F03918 Unspecified dementia, unspecified severity, with other behavioral disturbance: Secondary | ICD-10-CM

## 2014-02-25 NOTE — Progress Notes (Signed)
Patient ID: Ana Newman, female   DOB: 1935/06/11, 79 y.o.   MRN: [id number]    [place] of Service: SNF (31)    No Known Allergies  Chief Complaint  Patient presents with  . Acute Visit    cough and congestion; hx DM and dementia    HPI:  79 yo female ling term resident sen today for above. She c/o 1 week hx post nasal drip, "choking", sore throat, shortness of breath with cough. No CP,HA, dizziness, f/c. No known sick contacts  CODE STATUS: Full  Medications: Patient's Medications  New Prescriptions   No medications on file  Previous Medications   ACETAMINOPHEN (TYLENOL) 325 MG TABLET    Take 650 mg by mouth every 6 (six) hours as needed.   ACETAMINOPHEN (TYLENOL) 500 MG TABLET    Take 1,000 mg by mouth daily.   ALUM & MAG HYDROXIDE-SIMETH (MYLANTA) 175-102-58 MG/5ML SUSPENSION    Take 10 mLs by mouth every 12 (twelve) hours as needed for indigestion or heartburn.   ANTISEPTIC ORAL RINSE (BIOTENE) LIQD    15 mLs by Mouth Rinse route as needed for dry mouth.   ARTIFICIAL TEAR OINTMENT (ARTIFICIAL TEARS) OINTMENT    Place 1 drop into both eyes as needed (for dry eyes).   ASPIRIN 325 MG TABLET    Take 325 mg by mouth daily. Take 1 tablet by mouth once daily.   BISACODYL (DULCOLAX) 10 MG SUPPOSITORY    Place 10 mg rectally as needed for constipation. Give 1 rectal suppository every 3 days as needed for no BM per standing order.   CHOLECALCIFEROL (VITAMIN D) 1000 UNITS TABLET    Take 2,000 Units by mouth daily.   DOCUSATE SODIUM (COLACE) 100 MG CAPSULE    Take 100 mg by mouth daily. Take 1 tablet by mouth daily as needed for constipation.   INSULIN GLARGINE (LANTUS) 100 UNIT/ML INJECTION    Inject 20 units subcutaneously at bedtime for diabetes. Inject 25 units subcutaneously in the morning for diabetes.   LAMOTRIGINE (LAMICTAL) 100 MG TABLET    Take 50 mg by mouth daily. Take 1 tablet daily by mouth for mood disorder.   LISINOPRIL  (PRINIVIL,ZESTRIL) 2.5 MG TABLET    Take 2.5 mg by mouth daily.   LORATADINE (CLARITIN) 10 MG TABLET    Take 10 mg by mouth daily. Give 1 tablet every 24 hours as needed for allergies.   MECLIZINE (ANTIVERT) 25 MG TABLET    Take 25 mg by mouth 3 (three) times daily as needed for dizziness.   METOPROLOL TARTRATE (LOPRESSOR) 25 MG TABLET    Take 25 mg by mouth 2 (two) times daily. Give 1 tablet by mouth twice daily for hypertension.   POLYETHYLENE GLYCOL (MIRALAX / GLYCOLAX) PACKET    Take 17 g by mouth daily. Give one 17 gram by mouth once daily for constipation.   POLYVINYL ALCOHOL-POVIDONE (FRESHKOTE) 2.7-2 % SOLN    Apply 1 drop to eye 3 (three) times daily. To both eyes   PROMETHAZINE (PHENERGAN) 25 MG TABLET    Take 25 mg by mouth every 6 (six) hours as needed for nausea. Give 1 tablet by mouth daily, every 6 hours as needed for nausea.   RANITIDINE (ZANTAC) 150 MG TABLET    Take 150 mg by mouth daily.   SENNA-DOCUSATE (SENOKOT S) 8.6-50 MG PER TABLET    Take 1 tablet by mouth daily. Take 2 tablets by mouth daily for  constipation.  Modified Medications   No medications on file  Discontinued Medications   No medications on file     Review of Systems  As above. All other systems reviewed are negative  Filed Vitals:   02/28/14 2205  BP: 122/70  Pulse: 93  Temp: 97.1 F (36.2 C)  SpO2: 98%   There is no weight on file to calculate BMI.  Physical Exam CONSTITUTIONAL: Looks ill in NAD. Awake and alert HEENT: PERRLA. Oropharynx clear and without exudate. MMM NECK: Supple. Nontender. No palpable cervical or supraclavicular lymph nodes.   CVS: Regular rate without murmur, gallop or rub. LUNGS: b/l end expiratory wheezing but no rales or rhonchi. Prolonged expiratory phase EXTREMITIES: No edema b/l. Distal pulses palpable.   Labs reviewed:  . Hgb A1c MFr Bld 07/25/2013 6.1* 4.0 - 6.0 % Final     Assessment/Plan   ICD-9-CM ICD-10-CM   1. Acute bronchitis due to other specified  organisms 466.0 J20.8   2. Diabetes mellitus type II, controlled 250.00 E11.9   3. Dementia with behavioral disturbance 294.21 F03.91    --Rx tessalon perles 200mg  TID prn cough  --Rx duoneb QID prn SOB, cough or wheezing. Give 1st dose now  --Rx Zpak as directed  --push fluids and rest  --if no better, may need CXR to r/o pneumonia   Ricketta Colantonio S. Perlie Gold  [facility] [address] [place], [place] [address] [phone] Office (Wednesdays and Fridays 8 AM - 5 PM) [phone] Cell (Monday-Friday 8 AM - 5 PM)

## 2014-02-27 LAB — BASIC METABOLIC PANEL
BUN: 20 mg/dL (ref 4–21)
Creatinine: 0.8 mg/dL (ref 0.5–1.1)
Glucose: 208 mg/dL
POTASSIUM: 4.9 mmol/L (ref 3.4–5.3)
Sodium: 138 mmol/L (ref 137–147)

## 2014-02-28 ENCOUNTER — Encounter: Payer: Self-pay | Admitting: Internal Medicine

## 2014-03-04 ENCOUNTER — Encounter: Payer: Self-pay | Admitting: Adult Health

## 2014-03-04 DIAGNOSIS — M150 Primary generalized (osteo)arthritis: Secondary | ICD-10-CM | POA: Diagnosis not present

## 2014-03-04 DIAGNOSIS — M79675 Pain in left toe(s): Secondary | ICD-10-CM | POA: Diagnosis not present

## 2014-03-04 DIAGNOSIS — Z794 Long term (current) use of insulin: Secondary | ICD-10-CM | POA: Diagnosis not present

## 2014-03-04 DIAGNOSIS — M79674 Pain in right toe(s): Secondary | ICD-10-CM | POA: Diagnosis not present

## 2014-03-04 DIAGNOSIS — G301 Alzheimer's disease with late onset: Secondary | ICD-10-CM | POA: Diagnosis not present

## 2014-03-04 DIAGNOSIS — Z79899 Other long term (current) drug therapy: Secondary | ICD-10-CM | POA: Diagnosis not present

## 2014-03-04 DIAGNOSIS — E785 Hyperlipidemia, unspecified: Secondary | ICD-10-CM | POA: Diagnosis not present

## 2014-03-04 DIAGNOSIS — F0391 Unspecified dementia with behavioral disturbance: Secondary | ICD-10-CM | POA: Diagnosis not present

## 2014-03-04 DIAGNOSIS — M79672 Pain in left foot: Secondary | ICD-10-CM | POA: Diagnosis not present

## 2014-03-04 DIAGNOSIS — H3531 Nonexudative age-related macular degeneration: Secondary | ICD-10-CM | POA: Diagnosis not present

## 2014-03-04 DIAGNOSIS — E039 Hypothyroidism, unspecified: Secondary | ICD-10-CM | POA: Diagnosis not present

## 2014-03-04 DIAGNOSIS — F39 Unspecified mood [affective] disorder: Secondary | ICD-10-CM | POA: Diagnosis not present

## 2014-03-04 DIAGNOSIS — E169 Disorder of pancreatic internal secretion, unspecified: Secondary | ICD-10-CM | POA: Diagnosis not present

## 2014-03-04 DIAGNOSIS — M79671 Pain in right foot: Secondary | ICD-10-CM | POA: Diagnosis not present

## 2014-03-04 DIAGNOSIS — G309 Alzheimer's disease, unspecified: Secondary | ICD-10-CM | POA: Diagnosis not present

## 2014-03-04 DIAGNOSIS — F23 Brief psychotic disorder: Secondary | ICD-10-CM | POA: Diagnosis not present

## 2014-03-04 DIAGNOSIS — E1165 Type 2 diabetes mellitus with hyperglycemia: Secondary | ICD-10-CM | POA: Diagnosis not present

## 2014-03-04 DIAGNOSIS — F329 Major depressive disorder, single episode, unspecified: Secondary | ICD-10-CM | POA: Diagnosis not present

## 2014-03-04 DIAGNOSIS — H81399 Other peripheral vertigo, unspecified ear: Secondary | ICD-10-CM | POA: Diagnosis not present

## 2014-03-04 DIAGNOSIS — R05 Cough: Secondary | ICD-10-CM | POA: Diagnosis not present

## 2014-03-04 DIAGNOSIS — F29 Unspecified psychosis not due to a substance or known physiological condition: Secondary | ICD-10-CM | POA: Diagnosis not present

## 2014-03-04 DIAGNOSIS — B351 Tinea unguium: Secondary | ICD-10-CM | POA: Diagnosis not present

## 2014-03-04 DIAGNOSIS — E119 Type 2 diabetes mellitus without complications: Secondary | ICD-10-CM | POA: Diagnosis not present

## 2014-03-04 DIAGNOSIS — Z961 Presence of intraocular lens: Secondary | ICD-10-CM | POA: Diagnosis not present

## 2014-03-04 DIAGNOSIS — I10 Essential (primary) hypertension: Secondary | ICD-10-CM | POA: Diagnosis not present

## 2014-03-04 DIAGNOSIS — K219 Gastro-esophageal reflux disease without esophagitis: Secondary | ICD-10-CM | POA: Diagnosis not present

## 2014-03-04 DIAGNOSIS — H04123 Dry eye syndrome of bilateral lacrimal glands: Secondary | ICD-10-CM | POA: Diagnosis not present

## 2014-03-04 DIAGNOSIS — F0280 Dementia in other diseases classified elsewhere without behavioral disturbance: Secondary | ICD-10-CM | POA: Diagnosis not present

## 2014-03-04 DIAGNOSIS — D649 Anemia, unspecified: Secondary | ICD-10-CM | POA: Diagnosis not present

## 2014-03-04 DIAGNOSIS — F0390 Unspecified dementia without behavioral disturbance: Secondary | ICD-10-CM | POA: Diagnosis not present

## 2014-03-04 MED ORDER — LISINOPRIL 2.5 MG PO TABS: 5.0000 mg | ORAL_TABLET | Freq: Every day | ORAL | Status: DC

## 2014-03-04 MED ORDER — FENOFIBRATE 48 MG PO TABS: 48.0000 mg | ORAL_TABLET | Freq: Every day | ORAL | Status: DC

## 2014-03-04 NOTE — Progress Notes (Signed)
Patient ID: Ana Newman, female   DOB: 09-28-1935, 79 y.o.   MRN: [id number]  [facility]     No Known Allergies   Chief Complaint  Patient presents with  . Acute Visit    follow up labs and blood pressure       HPI:  Her trig are elevated at 396. Her blood pressure is elevated. She is not voicing any complaints at this time. She states that she is feeling good and is not voicing any complaints at this time.    Past Medical History  Diagnosis Date  . Reflux esophagitis   . Unspecified constipation   . Essential hypertension, benign   . Alzheimer's disease   . Type II or unspecified type diabetes mellitus without mention of complication, uncontrolled   . Rickets, active   . Unspecified psychosis   . Unspecified vitamin D deficiency   . Osteoarthritis     Past Surgical History  Procedure Laterality Date  . Abdominal hysterectomy    . Joint replacement Left     VITAL SIGNS BP 140/72 mmHg  Pulse 78  Ht 5\' 2"  (1.575 m)  Wt 168 lb (76.204 kg)  BMI 30.72 kg/m2  SpO2 99%   Outpatient Encounter Prescriptions as of 02/12/2014  Medication Sig  . acetaminophen (TYLENOL) 325 MG tablet Take 650 mg by mouth every 6 (six) hours as needed.  Marland Kitchen acetaminophen (TYLENOL) 500 MG tablet Take 1,000 mg by mouth daily.  Marland Kitchen alum & mag hydroxide-simeth (MYLANTA) 350-093-81 MG/5ML suspension Take 10 mLs by mouth every 12 (twelve) hours as needed for indigestion or heartburn.  Marland Kitchen antiseptic oral rinse (BIOTENE) LIQD 15 mLs by Mouth Rinse route as needed for dry mouth.  . Artificial Tear Ointment (ARTIFICIAL TEARS) ointment Place 1 drop into both eyes as needed (for dry eyes).  Marland Kitchen aspirin 325 MG tablet Take 325 mg by mouth daily. Take 1 tablet by mouth once daily.  . bisacodyl (DULCOLAX) 10 MG suppository Place 10 mg rectally as needed for constipation. Give 1 rectal suppository every 3 days as needed for no BM per standing order.  . cholecalciferol (VITAMIN D) 1000 UNITS tablet Take 2,000  Units by mouth daily.  Marland Kitchen docusate sodium (COLACE) 100 MG capsule Take 100 mg by mouth daily. Take 1 tablet by mouth daily as needed for constipation.  . insulin glargine (LANTUS) 100 UNIT/ML injection Inject 20 units subcutaneously at bedtime for diabetes. Inject 25 units subcutaneously in the morning for diabetes.  Marland Kitchen lamoTRIgine (LAMICTAL) 100 MG tablet Take 50 mg by mouth daily. Take 1 tablet daily by mouth for mood disorder.  Marland Kitchen lisinopril (PRINIVIL,ZESTRIL) 2.5 MG tablet Take 2.5 mg by mouth daily.  Marland Kitchen loratadine (CLARITIN) 10 MG tablet Take 10 mg by mouth daily. Give 1 tablet every 24 hours as needed for allergies.  . metoprolol tartrate (LOPRESSOR) 25 MG tablet Take 25 mg by mouth 2 (two) times daily. Give 1 tablet by mouth twice daily for hypertension.  . polyethylene glycol (MIRALAX / GLYCOLAX) packet Take 17 g by mouth daily. Give one 17 gram by mouth once daily for constipation.  . Polyvinyl Alcohol-Povidone (FRESHKOTE) 2.7-2 % SOLN Apply 1 drop to eye 3 (three) times daily. To both eyes  . promethazine (PHENERGAN) 25 MG tablet Take 25 mg by mouth every 6 (six) hours as needed for nausea. Give 1 tablet by mouth daily, every 6 hours as needed for nausea.  . ranitidine (ZANTAC) 150 MG tablet Take 150 mg by mouth daily as  needed.   . senna-docusate (SENOKOT S) 8.6-50 MG per tablet Take 1 tablet by mouth daily. Take 2 tablets by mouth daily for constipation.  . [DISCONTINUED] meclizine (ANTIVERT) 25 MG tablet Take 25 mg by mouth 3 (three) times daily as needed for dizziness.     SIGNIFICANT DIAGNOSTIC EXAMS   LABS REVIEWED:   07-25-13: chol 180; ldl 99; trig 239; hdl 34; hgb a1c 6.1 12-06-13: urine  For micro albumin 2.3 01-15-14: glucose 145; bun 22.4; creat 0.89; k+4.8; na++138; liver normal albumin 4.0   Review of Systems  Constitutional: Negative for malaise/fatigue.  Respiratory: Negative for cough and shortness of breath.   Cardiovascular: Negative for chest pain.    Gastrointestinal: Negative for abdominal pain.  Musculoskeletal: Negative for myalgias and joint pain.  Psychiatric/Behavioral: The patient is not nervous/anxious.      Physical Exam  Constitutional: She appears well-developed and well-nourished. No distress.  Neck: Neck supple. No JVD present.  Cardiovascular: Normal rate, regular rhythm and intact distal pulses.   Respiratory: Effort normal and breath sounds normal. No respiratory distress.  GI: Soft. Bowel sounds are normal. She exhibits no distension. There is no tenderness.  Musculoskeletal: She exhibits no edema.  Is able to move all extremities   Neurological: She is alert.  Skin: Skin is warm and dry. She is not diaphoretic.       ASSESSMENT/ PLAN:  1. Hypertension: will increase lisinopril to 5 mg daily; and will continue lopressor 25 mg twice daily; and will monitor will check bmp in 2 weeks   2. Dyslipidemia: will begin fenofibrate 48 mg daily and will check lipids in 2 months.     Ok Edwards NP [facility]  Contact [phone] Monday through Friday 8am- 5pm  After hours call [phone]

## 2014-03-12 DIAGNOSIS — E119 Type 2 diabetes mellitus without complications: Secondary | ICD-10-CM | POA: Diagnosis not present

## 2014-03-12 DIAGNOSIS — H3531 Nonexudative age-related macular degeneration: Secondary | ICD-10-CM | POA: Diagnosis not present

## 2014-03-12 DIAGNOSIS — Z961 Presence of intraocular lens: Secondary | ICD-10-CM | POA: Diagnosis not present

## 2014-03-12 DIAGNOSIS — H04123 Dry eye syndrome of bilateral lacrimal glands: Secondary | ICD-10-CM | POA: Diagnosis not present

## 2014-03-31 ENCOUNTER — Non-Acute Institutional Stay (SKILLED_NURSING_FACILITY): Payer: Medicare Other | Admitting: Internal Medicine

## 2014-03-31 DIAGNOSIS — R059 Cough, unspecified: Secondary | ICD-10-CM

## 2014-03-31 DIAGNOSIS — M159 Polyosteoarthritis, unspecified: Secondary | ICD-10-CM

## 2014-03-31 DIAGNOSIS — I10 Essential (primary) hypertension: Secondary | ICD-10-CM | POA: Diagnosis not present

## 2014-03-31 DIAGNOSIS — M150 Primary generalized (osteo)arthritis: Secondary | ICD-10-CM

## 2014-03-31 DIAGNOSIS — F0391 Unspecified dementia with behavioral disturbance: Secondary | ICD-10-CM

## 2014-03-31 DIAGNOSIS — E039 Hypothyroidism, unspecified: Secondary | ICD-10-CM | POA: Diagnosis not present

## 2014-03-31 DIAGNOSIS — E119 Type 2 diabetes mellitus without complications: Secondary | ICD-10-CM | POA: Diagnosis not present

## 2014-03-31 DIAGNOSIS — E785 Hyperlipidemia, unspecified: Secondary | ICD-10-CM | POA: Diagnosis not present

## 2014-03-31 DIAGNOSIS — R05 Cough: Secondary | ICD-10-CM | POA: Diagnosis not present

## 2014-03-31 DIAGNOSIS — F03918 Unspecified dementia, unspecified severity, with other behavioral disturbance: Secondary | ICD-10-CM

## 2014-04-01 ENCOUNTER — Encounter: Payer: Self-pay | Admitting: Internal Medicine

## 2014-04-01 MED ORDER — BENZONATATE 100 MG PO CAPS: 200.0000 mg | ORAL_CAPSULE | Freq: Three times a day (TID) | ORAL | Status: DC | PRN

## 2014-04-01 NOTE — Progress Notes (Signed)
Patient ID: Ana Newman, female   DOB: [birth year], 79 y.o.   MRN: [id number]    Facility  [facility]    Place of Service:   OFFICE   No Known Allergies  Chief Complaint  Patient presents with  . Medical Management of Chronic Issues    alzheimer's disease, HTN, DM II, rflux esophagitis, depression w psychosis, OA, hypothyroidism, insomnia    HPI:  79 yo female long term resident seen today for f/u. She reports feeling well overall. She had her diabetic eye exam last week. Her cough is improved since last visit but she still takes prn tessalon perles. No CP or SOB. No longer taking insulin for diabetes. Last A1c 6.2% in Dec 2015. BP is stable on lisinopril. No thyroid sx's and she does not take medicine to control it. No acid reflux sx's. unable to obtain further hx due to pt's dementia.  No nursing issues.  CODE  STATUS: FULL  Past Medical History  Diagnosis Date  . Reflux esophagitis   . Unspecified constipation   . Essential hypertension, benign   . Alzheimer's disease   . Type II or unspecified type diabetes mellitus without mention of complication, uncontrolled   . Rickets, active   . Unspecified psychosis   . Unspecified vitamin D deficiency   . Osteoarthritis      Medications: Patient's Medications  New Prescriptions   No medications on file  Previous Medications   ACETAMINOPHEN (TYLENOL) 325 MG TABLET    Take 650 mg by mouth every 6 (six) hours as needed.   ACETAMINOPHEN (TYLENOL) 500 MG TABLET    Take 1,000 mg by mouth daily.   ALUM & MAG HYDROXIDE-SIMETH (MYLANTA) 536-144-31 MG/5ML SUSPENSION    Take 10 mLs by mouth every 12 (twelve) hours as needed for indigestion or heartburn.   ANTISEPTIC ORAL RINSE (BIOTENE) LIQD    15 mLs by Mouth Rinse route as needed for dry mouth.   ARTIFICIAL TEAR OINTMENT (ARTIFICIAL TEARS) OINTMENT    Place 1 drop into both eyes as needed (for dry eyes).   ASPIRIN 325 MG TABLET    Take 325 mg by mouth daily. Take 1 tablet by mouth once  daily.   BISACODYL (DULCOLAX) 10 MG SUPPOSITORY    Place 10 mg rectally as needed for constipation. Give 1 rectal suppository every 3 days as needed for no BM per standing order.   CHOLECALCIFEROL (VITAMIN D) 1000 UNITS TABLET    Take 2,000 Units by mouth daily.   DOCUSATE SODIUM (COLACE) 100 MG CAPSULE    Take 100 mg by mouth daily. Take 1 tablet by mouth daily as needed for constipation.   FENOFIBRATE (TRICOR) 48 MG TABLET    Take 1 tablet (48 mg total) by mouth daily.   LAMOTRIGINE (LAMICTAL) 100 MG TABLET    Take 50 mg by mouth daily. Take 1 tablet daily by mouth for mood disorder.   LISINOPRIL (PRINIVIL,ZESTRIL) 2.5 MG TABLET    Take 2 tablets (5 mg total) by mouth daily.   LORATADINE (CLARITIN) 10 MG TABLET    Take 10 mg by mouth daily. Give 1 tablet every 24 hours as needed for allergies.   POLYETHYLENE GLYCOL (MIRALAX / GLYCOLAX) PACKET    Take 17 g by mouth daily. Give one 17 gram by mouth once daily for constipation.   POLYVINYL ALCOHOL-POVIDONE (FRESHKOTE) 2.7-2 % SOLN    Apply 1 drop to eye 3 (three) times daily. To both eyes   SENNA-DOCUSATE (SENOKOT S) 8.6-50 MG  PER TABLET    Take 1 tablet by mouth daily. Take 2 tablets by mouth daily for constipation.  Modified Medications   No medications on file  Discontinued Medications   INSULIN GLARGINE (LANTUS) 100 UNIT/ML INJECTION    Inject 20 units subcutaneously at bedtime for diabetes. Inject 25 units subcutaneously in the morning for diabetes.   METOPROLOL TARTRATE (LOPRESSOR) 25 MG TABLET    Take 25 mg by mouth 2 (two) times daily. Give 1 tablet by mouth twice daily for hypertension.   PROMETHAZINE (PHENERGAN) 25 MG TABLET    Take 25 mg by mouth every 6 (six) hours as needed for nausea. Give 1 tablet by mouth daily, every 6 hours as needed for nausea.   RANITIDINE (ZANTAC) 150 MG TABLET    Take 150 mg by mouth daily as needed.      Review of Systems  Unable to perform ROS: Dementia      Filed Vitals:   03/14/14 2114  BP:  133/69  Pulse: 82   There is no weight on file to calculate BMI.  Physical Exam  Constitutional: No distress.  Frail appearing  HENT:  Mouth/Throat: Oropharynx is clear and moist. No oropharyngeal exudate.  Eyes: Pupils are equal, round, and reactive to light. No scleral icterus.  Neck: Neck supple. No tracheal deviation present. No thyromegaly present.  Cardiovascular: Normal rate, regular rhythm, normal heart sounds and intact distal pulses.  Exam reveals no gallop and no friction rub.   No murmur heard. No LE edema b/l  Pulmonary/Chest: Effort normal and breath sounds normal. No stridor. No respiratory distress. She has no wheezes. She has no rales.  Reduced BS at base b/l  Abdominal: Soft. Bowel sounds are normal. She exhibits no distension and no mass. There is no tenderness. There is no rebound and no guarding.  Musculoskeletal:  Foot contracture b/l  Lymphadenopathy:    She has no cervical adenopathy.  Neurological: She is alert.  And awake  Skin: Skin is warm and dry. No rash noted.  Psychiatric: She has a normal mood and affect. Her behavior is normal.     Labs reviewed: CMP Latest Ref Rng 02/27/2014 07/25/2013  BUN 4 - 21 mg/dL 20 20  Creatinine 0.5 - 1.1 mg/dL 0.8 0.8  Sodium 137 - 147 mmol/L 138 137  Potassium 3.4 - 5.3 mmol/L 4.9 4.6  Alkaline Phos 25 - 125 U/L - 61  AST 13 - 35 U/L - 17  ALT 7 - 35 U/L - 23       Assessment/Plan   ICD-9-CM ICD-10-CM   1. Cough - improving 786.2 R05   2. Essential hypertension, benign - stable on lisinopril 401.1 I10   3. Diabetes mellitus type II, controlled with diet 250.00 E11.9   4. Dementia with behavioral disturbance - stable on lamictal 294.21 F03.91   5. Hypothyroidism, unspecified hypothyroidism type asymptomatic 244.9 E03.9   6. Dyslipidemia - stable with fenofibrate 272.4 E78.5   7. Primary osteoarthritis involving multiple joints - pain controlled on Tylenol 715.09 M15.0     --will check A1c, fasting lipid  panel, TSH and CMP in 3 mos  --continue prn tessalon perles for cough along with duoneb prn  --Pt is medically stable on current tx plan. Will follow  Ebon Ketchum S. Perlie Gold  [facility] [address] [place], [place] [address] [phone] Office (Wednesdays and Fridays 8 AM - 5 PM) [phone] Cell (Monday-Friday 8 AM -  5 PM)

## 2014-04-24 DIAGNOSIS — F39 Unspecified mood [affective] disorder: Secondary | ICD-10-CM | POA: Diagnosis not present

## 2014-05-02 ENCOUNTER — Non-Acute Institutional Stay (SKILLED_NURSING_FACILITY): Payer: Medicare Other | Admitting: Adult Health

## 2014-05-02 DIAGNOSIS — E785 Hyperlipidemia, unspecified: Secondary | ICD-10-CM

## 2014-05-02 DIAGNOSIS — I10 Essential (primary) hypertension: Secondary | ICD-10-CM

## 2014-05-02 DIAGNOSIS — F29 Unspecified psychosis not due to a substance or known physiological condition: Secondary | ICD-10-CM | POA: Diagnosis not present

## 2014-05-02 DIAGNOSIS — F32A Depression, unspecified: Secondary | ICD-10-CM

## 2014-05-02 DIAGNOSIS — M150 Primary generalized (osteo)arthritis: Secondary | ICD-10-CM

## 2014-05-02 DIAGNOSIS — F329 Major depressive disorder, single episode, unspecified: Secondary | ICD-10-CM

## 2014-05-02 DIAGNOSIS — M159 Polyosteoarthritis, unspecified: Secondary | ICD-10-CM

## 2014-05-02 DIAGNOSIS — E119 Type 2 diabetes mellitus without complications: Secondary | ICD-10-CM

## 2014-05-10 DIAGNOSIS — F39 Unspecified mood [affective] disorder: Secondary | ICD-10-CM | POA: Diagnosis not present

## 2014-05-29 ENCOUNTER — Non-Acute Institutional Stay (SKILLED_NURSING_FACILITY): Payer: Medicare Other | Admitting: Adult Health

## 2014-05-29 ENCOUNTER — Encounter: Payer: Self-pay | Admitting: *Deleted

## 2014-05-29 DIAGNOSIS — M159 Polyosteoarthritis, unspecified: Secondary | ICD-10-CM

## 2014-05-29 DIAGNOSIS — E119 Type 2 diabetes mellitus without complications: Secondary | ICD-10-CM | POA: Diagnosis not present

## 2014-05-29 DIAGNOSIS — F32A Depression, unspecified: Secondary | ICD-10-CM

## 2014-05-29 DIAGNOSIS — M150 Primary generalized (osteo)arthritis: Secondary | ICD-10-CM

## 2014-05-29 DIAGNOSIS — F329 Major depressive disorder, single episode, unspecified: Secondary | ICD-10-CM

## 2014-05-29 DIAGNOSIS — E785 Hyperlipidemia, unspecified: Secondary | ICD-10-CM | POA: Diagnosis not present

## 2014-05-29 DIAGNOSIS — I10 Essential (primary) hypertension: Secondary | ICD-10-CM

## 2014-06-02 DIAGNOSIS — F329 Major depressive disorder, single episode, unspecified: Secondary | ICD-10-CM | POA: Diagnosis not present

## 2014-06-02 DIAGNOSIS — E785 Hyperlipidemia, unspecified: Secondary | ICD-10-CM | POA: Diagnosis not present

## 2014-06-02 DIAGNOSIS — M79672 Pain in left foot: Secondary | ICD-10-CM | POA: Diagnosis not present

## 2014-06-02 DIAGNOSIS — H81399 Other peripheral vertigo, unspecified ear: Secondary | ICD-10-CM | POA: Diagnosis not present

## 2014-06-02 DIAGNOSIS — M150 Primary generalized (osteo)arthritis: Secondary | ICD-10-CM | POA: Diagnosis not present

## 2014-06-02 DIAGNOSIS — K219 Gastro-esophageal reflux disease without esophagitis: Secondary | ICD-10-CM | POA: Diagnosis not present

## 2014-06-02 DIAGNOSIS — G309 Alzheimer's disease, unspecified: Secondary | ICD-10-CM | POA: Diagnosis not present

## 2014-06-02 DIAGNOSIS — I10 Essential (primary) hypertension: Secondary | ICD-10-CM | POA: Diagnosis not present

## 2014-06-02 DIAGNOSIS — G301 Alzheimer's disease with late onset: Secondary | ICD-10-CM | POA: Diagnosis not present

## 2014-06-02 DIAGNOSIS — Z79899 Other long term (current) drug therapy: Secondary | ICD-10-CM | POA: Diagnosis not present

## 2014-06-02 DIAGNOSIS — E119 Type 2 diabetes mellitus without complications: Secondary | ICD-10-CM | POA: Diagnosis not present

## 2014-06-02 DIAGNOSIS — F0280 Dementia in other diseases classified elsewhere without behavioral disturbance: Secondary | ICD-10-CM | POA: Diagnosis not present

## 2014-06-02 DIAGNOSIS — B351 Tinea unguium: Secondary | ICD-10-CM | POA: Diagnosis not present

## 2014-06-02 DIAGNOSIS — F0390 Unspecified dementia without behavioral disturbance: Secondary | ICD-10-CM | POA: Diagnosis not present

## 2014-06-02 DIAGNOSIS — D649 Anemia, unspecified: Secondary | ICD-10-CM | POA: Diagnosis not present

## 2014-06-02 DIAGNOSIS — E1165 Type 2 diabetes mellitus with hyperglycemia: Secondary | ICD-10-CM | POA: Diagnosis not present

## 2014-06-02 DIAGNOSIS — F23 Brief psychotic disorder: Secondary | ICD-10-CM | POA: Diagnosis not present

## 2014-06-02 DIAGNOSIS — Z794 Long term (current) use of insulin: Secondary | ICD-10-CM | POA: Diagnosis not present

## 2014-06-02 DIAGNOSIS — M79674 Pain in right toe(s): Secondary | ICD-10-CM | POA: Diagnosis not present

## 2014-06-02 DIAGNOSIS — E039 Hypothyroidism, unspecified: Secondary | ICD-10-CM | POA: Diagnosis not present

## 2014-06-02 DIAGNOSIS — F29 Unspecified psychosis not due to a substance or known physiological condition: Secondary | ICD-10-CM | POA: Diagnosis not present

## 2014-06-02 DIAGNOSIS — F39 Unspecified mood [affective] disorder: Secondary | ICD-10-CM | POA: Diagnosis not present

## 2014-06-02 DIAGNOSIS — M79675 Pain in left toe(s): Secondary | ICD-10-CM | POA: Diagnosis not present

## 2014-06-02 DIAGNOSIS — M79671 Pain in right foot: Secondary | ICD-10-CM | POA: Diagnosis not present

## 2014-06-27 DIAGNOSIS — E119 Type 2 diabetes mellitus without complications: Secondary | ICD-10-CM | POA: Insufficient documentation

## 2014-06-27 NOTE — Progress Notes (Signed)
Patient ID: Ana Newman, female   DOB: 18-Apr-1935, 79 y.o.   MRN: [id number]  [facility]     No Known Allergies     Chief Complaint  Patient presents with  . Annual Exam    HPI:  She is a long term resident of this facility being seen for her annual exam. Overall her status has remained stable over the past year. She has not been hospitalized over the past year. She states that she is feeling good. There are no nursing concerns today.    Past Medical History  Diagnosis Date  . Reflux esophagitis   . Unspecified constipation   . Essential hypertension, benign   . Alzheimer's disease   . Type II or unspecified type diabetes mellitus without mention of complication, uncontrolled   . Rickets, active   . Unspecified psychosis   . Unspecified vitamin D deficiency   . Osteoarthritis     Past Surgical History  Procedure Laterality Date  . Abdominal hysterectomy    . Joint replacement Left     No family history on file.  History   Social History  . Marital Status: Married    Spouse Name: N/A  . Number of Children: N/A  . Years of Education: N/A   Occupational History  . Not on file.   Social History Main Topics  . Smoking status: Never Smoker   . Smokeless tobacco: Not on file  . Alcohol Use: Not on file  . Drug Use: Not on file  . Sexual Activity: Not on file   Other Topics Concern  . Not on file   Social History Narrative     VITAL SIGNS BP 139/76 mmHg  Pulse 72  Ht 4\' 11"  (1.499 m)  Wt 168 lb (76.204 kg)  BMI 33.91 kg/m2  SpO2 97%   Outpatient Encounter Prescriptions as of 05/02/2014  Medication Sig  . acetaminophen (TYLENOL) 325 MG tablet Take 650 mg by mouth every 6 (six) hours as needed.  Marland Kitchen acetaminophen (TYLENOL) 500 MG tablet Take 1,000 mg by mouth daily.  Marland Kitchen alum & mag hydroxide-simeth (MYLANTA) 935-701-77 MG/5ML suspension Take 10 mLs by mouth every 12 (twelve) hours as needed for indigestion or heartburn.  Marland Kitchen antiseptic oral rinse  (BIOTENE) LIQD 15 mLs by Mouth Rinse route as needed for dry mouth.  . Artificial Tear Ointment (ARTIFICIAL TEARS) ointment Place 1 drop into both eyes as needed (for dry eyes).  Marland Kitchen aspirin 325 MG tablet Take 325 mg by mouth daily. Take 1 tablet by mouth once daily.  . benzonatate (TESSALON) 100 MG capsule Take 2 capsules (200 mg total) by mouth 3 (three) times daily as needed for cough.  . bisacodyl (DULCOLAX) 10 MG suppository Place 10 mg rectally as needed for constipation. Give 1 rectal suppository every 3 days as needed for no BM per standing order.  . cholecalciferol (VITAMIN D) 1000 UNITS tablet Take 2,000 Units by mouth daily.  Marland Kitchen docusate sodium (COLACE) 100 MG capsule Take 100 mg by mouth daily. Take 1 tablet by mouth daily as needed for constipation.  Marland Kitchen lamoTRIgine (LAMICTAL) 100 MG tablet Take 50 mg by mouth daily. Take 1 tablet daily by mouth for mood disorder.  Marland Kitchen lisinopril (PRINIVIL,ZESTRIL) 2.5 MG tablet Take 2 tablets (5 mg total) by mouth daily.  Marland Kitchen loratadine (CLARITIN) 10 MG tablet Take 10 mg by mouth daily. Give 1 tablet every 24 hours as needed for allergies.  . polyethylene glycol (MIRALAX / GLYCOLAX) packet Take 17 g by  mouth daily. Give one 17 gram by mouth once daily for constipation.  . Polyvinyl Alcohol-Povidone (FRESHKOTE) 2.7-2 % SOLN Apply 1 drop to eye 3 (three) times daily. To both eyes  . senna-docusate (SENOKOT S) 8.6-50 MG per tablet Take 1 tablet by mouth daily. Take 2 tablets by mouth daily for constipation.      SIGNIFICANT DIAGNOSTIC EXAMS   LABS REVIEWED:   07-25-13: chol 180; ldl 99; trig 239; hdl 34; hgb a1c 6.1 12-06-13: urine  For micro albumin 2.3 01-15-14: glucose 145; bun 22.4; creat 0.89; k+4.8; na++138; liver normal albumin 4.0     ROS Constitutional: Negative for malaise/fatigue.  Respiratory: Negative for cough and shortness of breath.   Cardiovascular: Negative for chest pain.  Gastrointestinal: Negative for abdominal pain.    Musculoskeletal: Negative for myalgias and joint pain.  Psychiatric/Behavioral: The patient is not nervous/anxious.      Physical Exam Constitutional: She appears well-developed and well-nourished. No distress.  Neck: Neck supple. No JVD present.  Breast exam normal  Cardiovascular: Normal rate, regular rhythm and intact distal pulses.   Respiratory: Effort normal and breath sounds normal. No respiratory distress.  GI: Soft. Bowel sounds are normal. She exhibits no distension. There is no tenderness.  Musculoskeletal: She exhibits no edema.  Is able to move all extremities   Neurological: She is alert.  Skin: Skin is warm and dry. She is not diaphoretic.     ASSESSMENT/ PLAN:  1. Hypertension: will continue lisinopril 5 mg daily; will monitor  2. Dyslipidemia: her fenofibrate was stopped per her request. Will monitor  3. Constipation: will continue miralax daily    4. Osteoarthritis: no complaints of pain present; will continue tylenol 1 gm daily and will monitor  5. Diabetes: is presently not on medications will not make changes will monitor  6. Depression: is emotionally stable; is followed by psych services; will continue lamictal 25 mg daily to help stabilize mood.   She is not appropriate for dexa; mammogram; colonoscopy. Her health maintenance is otherwise up to date.     Ok Edwards NP [facility]  Contact [phone] Monday through Friday 8am- 5pm  After hours call [phone]

## 2014-06-28 ENCOUNTER — Encounter: Payer: Self-pay | Admitting: Adult Health

## 2014-06-28 NOTE — Progress Notes (Signed)
Patient ID: Ana Newman, female   DOB: 02/18/1935, 79 y.o.   MRN: [id number]  [facility]     No Known Allergies     Chief Complaint  Patient presents with  . Medical Management of Chronic Issues    HPI:  She is a long term resident of this facility being seen for the management of her chronic illnesses. Overall there is little change in her status. She is not voicing any complaints or concerns at this time. There are no nursing concerns at this time.    Past Medical History  Diagnosis Date  . Reflux esophagitis   . Unspecified constipation   . Essential hypertension, benign   . Alzheimer's disease   . Type II or unspecified type diabetes mellitus without mention of complication, uncontrolled   . Rickets, active   . Unspecified psychosis   . Unspecified vitamin D deficiency   . Osteoarthritis     Past Surgical History  Procedure Laterality Date  . Abdominal hysterectomy    . Joint replacement Left     VITAL SIGNS BP 130/68 mmHg  Pulse 74  Ht 4\' 11"  (1.499 m)  Wt 168 lb (76.204 kg)  BMI 33.91 kg/m2   Outpatient Encounter Prescriptions as of 05/29/2014  Medication Sig  . acetaminophen (TYLENOL) 325 MG tablet Take 650 mg by mouth every 6 (six) hours as needed.  Marland Kitchen acetaminophen (TYLENOL) 500 MG tablet Take 1,000 mg by mouth daily.  Marland Kitchen alum & mag hydroxide-simeth (MYLANTA) 599-357-01 MG/5ML suspension Take 10 mLs by mouth every 12 (twelve) hours as needed for indigestion or heartburn.  Marland Kitchen antiseptic oral rinse (BIOTENE) LIQD 15 mLs by Mouth Rinse route as needed for dry mouth.  . Artificial Tear Ointment (ARTIFICIAL TEARS) ointment Place 1 drop into both eyes as needed (for dry eyes).  Marland Kitchen aspirin 325 MG tablet Take 325 mg by mouth daily. Take 1 tablet by mouth once daily.  . benzonatate (TESSALON) 100 MG capsule Take 2 capsules (200 mg total) by mouth 3 (three) times daily as needed for cough.  . bisacodyl (DULCOLAX) 10 MG suppository Place 10 mg rectally as needed  for constipation. Give 1 rectal suppository every 3 days as needed for no BM per standing order.  . cholecalciferol (VITAMIN D) 1000 UNITS tablet Take 2,000 Units by mouth daily.  Marland Kitchen docusate sodium (COLACE) 100 MG capsule Take 100 mg by mouth daily. Take 1 tablet by mouth daily as needed for constipation.  Marland Kitchen lamoTRIgine (LAMICTAL) 100 MG tablet Take 50 mg by mouth daily. Take 1 tablet daily by mouth for mood disorder.  Marland Kitchen lisinopril (PRINIVIL,ZESTRIL) 2.5 MG tablet Take 2 tablets (5 mg total) by mouth daily.  Marland Kitchen loratadine (CLARITIN) 10 MG tablet Take 10 mg by mouth daily. Give 1 tablet every 24 hours as needed for allergies.  Marland Kitchen meclizine (ANTIVERT) 25 MG tablet Take 25 mg by mouth 3 (three) times daily as needed for dizziness. For dizziness  . metoprolol tartrate (LOPRESSOR) 25 MG tablet Take 25 mg by mouth 2 (two) times daily. For HTN  . polyethylene glycol (MIRALAX / GLYCOLAX) packet Take 17 g by mouth daily. Give one 17 gram by mouth once daily for constipation.  . Polyvinyl Alcohol-Povidone (FRESHKOTE) 2.7-2 % SOLN Apply 1 drop to eye 3 (three) times daily. To both eyes  . senna-docusate (SENOKOT S) 8.6-50 MG per tablet Take 1 tablet by mouth daily. Take 2 tablets by mouth daily for constipation.      SIGNIFICANT DIAGNOSTIC EXAMS  LABS REVIEWED:   07-25-13: chol 180; ldl 99; trig 239; hdl 34; hgb a1c 6.1 12-06-13: urine  For micro albumin 2.3 01-15-14: glucose 145; bun 22.4; creat 0.89; k+4.8; na++138; liver normal albumin 4.0    ROS Constitutional: Negative for malaise/fatigue.  Respiratory: Negative for cough and shortness of breath.   Cardiovascular: Negative for chest pain.  Gastrointestinal: Negative for abdominal pain.  Musculoskeletal: Negative for myalgias and joint pain.  Psychiatric/Behavioral: The patient is not nervous/anxious.      Physical Exam Constitutional: She appears well-developed and well-nourished. No distress.  Neck: Neck supple. No JVD present.  Breast  exam normal  Cardiovascular: Normal rate, regular rhythm and intact distal pulses.   Respiratory: Effort normal and breath sounds normal. No respiratory distress.  GI: Soft. Bowel sounds are normal. She exhibits no distension. There is no tenderness.  Musculoskeletal: She exhibits no edema.  Is able to move all extremities   Neurological: She is alert.  Skin: Skin is warm and dry. She is not diaphoretic.       ASSESSMENT/ PLAN:   1. Hypertension: will continue lisinopril 5 mg daily; will monitor  2. Dyslipidemia: her fenofibrate was stopped per her request. Will monitor  3. Constipation: will continue miralax daily    4. Osteoarthritis: no complaints of pain present; will continue tylenol 1 gm daily and will monitor  5. Diabetes: is presently not on medications will not make changes will monitor  6. Depression: is emotionally stable; is followed by psych services; will continue lamictal 25 mg daily to help stabilize mood.     Ok Edwards NP [facility]  Contact [phone] Monday through Friday 8am- 5pm  After hours call [phone]

## 2014-07-03 ENCOUNTER — Non-Acute Institutional Stay (SKILLED_NURSING_FACILITY): Payer: Medicare Other | Admitting: Adult Health

## 2014-07-03 DIAGNOSIS — F29 Unspecified psychosis not due to a substance or known physiological condition: Secondary | ICD-10-CM | POA: Diagnosis not present

## 2014-07-03 DIAGNOSIS — E785 Hyperlipidemia, unspecified: Secondary | ICD-10-CM

## 2014-07-03 DIAGNOSIS — E119 Type 2 diabetes mellitus without complications: Secondary | ICD-10-CM | POA: Diagnosis not present

## 2014-07-03 DIAGNOSIS — F329 Major depressive disorder, single episode, unspecified: Secondary | ICD-10-CM | POA: Diagnosis not present

## 2014-07-03 DIAGNOSIS — F32A Depression, unspecified: Secondary | ICD-10-CM

## 2014-07-03 DIAGNOSIS — M150 Primary generalized (osteo)arthritis: Secondary | ICD-10-CM

## 2014-07-03 DIAGNOSIS — G309 Alzheimer's disease, unspecified: Secondary | ICD-10-CM | POA: Diagnosis not present

## 2014-07-03 DIAGNOSIS — M159 Polyosteoarthritis, unspecified: Secondary | ICD-10-CM

## 2014-07-03 DIAGNOSIS — F0280 Dementia in other diseases classified elsewhere without behavioral disturbance: Secondary | ICD-10-CM

## 2014-07-03 DIAGNOSIS — I10 Essential (primary) hypertension: Secondary | ICD-10-CM

## 2014-07-10 ENCOUNTER — Encounter: Payer: Self-pay | Admitting: Adult Health

## 2014-07-10 DIAGNOSIS — G309 Alzheimer's disease, unspecified: Secondary | ICD-10-CM

## 2014-07-10 DIAGNOSIS — F0280 Dementia in other diseases classified elsewhere without behavioral disturbance: Secondary | ICD-10-CM | POA: Insufficient documentation

## 2014-07-10 NOTE — Progress Notes (Signed)
Patient ID: Ana Newman, female   DOB: [birth year], 79 y.o.   MRN: [id number]  [facility]     No Known Allergies     Chief Complaint  Patient presents with  . Medical Management of Chronic Issues    HPI:  She is a long term resident of this facility being seen for the management of her chronic illnesses. Overall her status is stable she states that she is feeling and has no concerns today. There are no nursing concerns being voiced today    Past Medical History  Diagnosis Date  . Reflux esophagitis   . Unspecified constipation   . Essential hypertension, benign   . Alzheimer's disease   . Type II or unspecified type diabetes mellitus without mention of complication, uncontrolled   . Rickets, active   . Unspecified psychosis   . Unspecified vitamin D deficiency   . Osteoarthritis     Past Surgical History  Procedure Laterality Date  . Abdominal hysterectomy    . Joint replacement Left     VITAL SIGNS BP 132/78 mmHg  Pulse 81  Ht 4\' 11"  (1.499 m)  Wt 176 lb (79.833 kg)  BMI 35.53 kg/m2  SpO2 96%   Outpatient Encounter Prescriptions as of 07/03/2014  Medication Sig  . acetaminophen (TYLENOL) 325 MG tablet Take 650 mg by mouth every 6 (six) hours as needed.  Marland Kitchen acetaminophen (TYLENOL) 500 MG tablet Take 1,000 mg by mouth daily.  Marland Kitchen alum & mag hydroxide-simeth (MYLANTA) 270-623-76 MG/5ML suspension Take 10 mLs by mouth every 12 (twelve) hours as needed for indigestion or heartburn.  Marland Kitchen antiseptic oral rinse (BIOTENE) LIQD 15 mLs by Mouth Rinse route 2 (two) times daily. And every 8 hours as needed  . Artificial Tear Ointment (ARTIFICIAL TEARS) ointment Place 1 drop into both eyes as needed (for dry eyes).  Marland Kitchen aspirin 325 MG tablet Take 325 mg by mouth daily. Take 1 tablet by mouth once daily.  . benzonatate (TESSALON) 100 MG capsule Take 2 capsules (200 mg total) by mouth 3 (three) times daily as needed for cough.  . bisacodyl (DULCOLAX) 5 MG EC tablet Take 10 mg by  mouth daily as needed for moderate constipation.  . cholecalciferol (VITAMIN D) 1000 UNITS tablet Take 2,000 Units by mouth daily.  Marland Kitchen docusate sodium (COLACE) 100 MG capsule Take 100 mg by mouth daily. Take 1 tablet by mouth daily as needed for constipation.  . insulin glargine (LANTUS) 100 UNIT/ML injection Inject 20-25 Units into the skin 2 (two) times daily. 25 units in the AM and 20 units in the PM  . ipratropium-albuterol (DUONEB) 0.5-2.5 (3) MG/3ML SOLN Take 3 mLs by nebulization every 6 (six) hours as needed.  . lamoTRIgine (LAMICTAL) 100 MG tablet Take 50 mg by mouth daily. Take 1 tablet daily by mouth for mood disorder.  Marland Kitchen lisinopril (PRINIVIL,ZESTRIL) 2.5 MG tablet Take 2 tablets (5 mg total) by mouth daily.  Marland Kitchen loratadine (CLARITIN) 10 MG tablet Take 10 mg by mouth daily. Give 1 tablet every 24 hours as needed for allergies.  Marland Kitchen meclizine (ANTIVERT) 25 MG tablet Take 25 mg by mouth 3 (three) times daily as needed for dizziness. For dizziness  . metoprolol tartrate (LOPRESSOR) 25 MG tablet Take 25 mg by mouth 2 (two) times daily. For HTN  . polyethylene glycol (MIRALAX / GLYCOLAX) packet Take 17 g by mouth daily. Give one 17 gram by mouth once daily for constipation.  . Polyvinyl Alcohol-Povidone (FRESHKOTE) 2.7-2 % SOLN Apply 1  drop to eye 3 (three) times daily. To both eyes  . senna-docusate (SENOKOT S) 8.6-50 MG per tablet Take 1 tablet by mouth daily. Take 2 tablets by mouth daily for constipation.      SIGNIFICANT DIAGNOSTIC EXAMS   LABS REVIEWED:   07-25-13: chol 180; ldl 99; trig 239; hdl 34; hgb a1c 6.1 12-06-13: urine  For micro albumin 2.3 01-15-14: glucose 145; bun 22.4; creat 0.89; k+4.8; na++138; liver normal albumin 4.0 05-31-14: hgb a1c 7.2    ROS Constitutional: Negative for malaise/fatigue.  Respiratory: Negative for cough and shortness of breath.   Cardiovascular: Negative for chest pain.  Gastrointestinal: Negative for abdominal pain.  Musculoskeletal:  Negative for myalgias and joint pain.  Psychiatric/Behavioral: The patient is not nervous/anxious.       Physical Exam Constitutional: She appears well-developed and well-nourished. No distress.  Neck: Neck supple. No JVD present.  Breast exam normal  Cardiovascular: Normal rate, regular rhythm and intact distal pulses.   Respiratory: Effort normal and breath sounds normal. No respiratory distress.  GI: Soft. Bowel sounds are normal. She exhibits no distension. There is no tenderness.  Musculoskeletal: She exhibits no edema.  Is able to move all extremities   Neurological: She is alert.  Skin: Skin is warm and dry. She is not diaphoretic.     ASSESSMENT/ PLAN:  1. Hypertension: will continue lisinopril 5 mg daily; and lopressor 25 mg twice daily and asa 325 mg daily   2. Diabetes: will continue lantus 25 units in the am and 20 units in the pm ; hgb a1c is 7.2  3. Depression: will continue colace twice daily and miralax daily and senna s 2 tabs daily   4. Dyslipidemia: is presently not on medications; will monitor  5. Osteoarthritis: pain is presently being managed; will continue tylenol 1 gm daily and 650 mg every 6 hours as needed  6. Depression with psychosis: is presently stable will continue lamictal 50 mg daily to stabilize mood  7. Alzheimer's disease: no change in status; is presently not taking medications will not make changes will monitor her status.      Ok Edwards NP [facility]  Contact [phone] Monday through Friday 8am- 5pm  After hours call [phone]

## 2014-08-02 DIAGNOSIS — E785 Hyperlipidemia, unspecified: Secondary | ICD-10-CM | POA: Diagnosis not present

## 2014-08-02 DIAGNOSIS — B351 Tinea unguium: Secondary | ICD-10-CM | POA: Diagnosis not present

## 2014-08-02 DIAGNOSIS — M150 Primary generalized (osteo)arthritis: Secondary | ICD-10-CM | POA: Diagnosis not present

## 2014-08-02 DIAGNOSIS — F0390 Unspecified dementia without behavioral disturbance: Secondary | ICD-10-CM | POA: Diagnosis not present

## 2014-08-02 DIAGNOSIS — G301 Alzheimer's disease with late onset: Secondary | ICD-10-CM | POA: Diagnosis not present

## 2014-08-02 DIAGNOSIS — D649 Anemia, unspecified: Secondary | ICD-10-CM | POA: Diagnosis not present

## 2014-08-02 DIAGNOSIS — Z794 Long term (current) use of insulin: Secondary | ICD-10-CM | POA: Diagnosis not present

## 2014-08-02 DIAGNOSIS — I10 Essential (primary) hypertension: Secondary | ICD-10-CM | POA: Diagnosis not present

## 2014-08-02 DIAGNOSIS — F0280 Dementia in other diseases classified elsewhere without behavioral disturbance: Secondary | ICD-10-CM | POA: Diagnosis not present

## 2014-08-02 DIAGNOSIS — Z79899 Other long term (current) drug therapy: Secondary | ICD-10-CM | POA: Diagnosis not present

## 2014-08-02 DIAGNOSIS — E119 Type 2 diabetes mellitus without complications: Secondary | ICD-10-CM | POA: Diagnosis not present

## 2014-08-02 DIAGNOSIS — M79672 Pain in left foot: Secondary | ICD-10-CM | POA: Diagnosis not present

## 2014-08-02 DIAGNOSIS — F23 Brief psychotic disorder: Secondary | ICD-10-CM | POA: Diagnosis not present

## 2014-08-02 DIAGNOSIS — E039 Hypothyroidism, unspecified: Secondary | ICD-10-CM | POA: Diagnosis not present

## 2014-08-02 DIAGNOSIS — F39 Unspecified mood [affective] disorder: Secondary | ICD-10-CM | POA: Diagnosis not present

## 2014-08-02 DIAGNOSIS — F329 Major depressive disorder, single episode, unspecified: Secondary | ICD-10-CM | POA: Diagnosis not present

## 2014-08-02 DIAGNOSIS — M79671 Pain in right foot: Secondary | ICD-10-CM | POA: Diagnosis not present

## 2014-08-02 DIAGNOSIS — F29 Unspecified psychosis not due to a substance or known physiological condition: Secondary | ICD-10-CM | POA: Diagnosis not present

## 2014-08-02 DIAGNOSIS — K219 Gastro-esophageal reflux disease without esophagitis: Secondary | ICD-10-CM | POA: Diagnosis not present

## 2014-08-02 DIAGNOSIS — E1165 Type 2 diabetes mellitus with hyperglycemia: Secondary | ICD-10-CM | POA: Diagnosis not present

## 2014-08-02 DIAGNOSIS — H81399 Other peripheral vertigo, unspecified ear: Secondary | ICD-10-CM | POA: Diagnosis not present

## 2014-08-02 DIAGNOSIS — G309 Alzheimer's disease, unspecified: Secondary | ICD-10-CM | POA: Diagnosis not present

## 2014-08-08 ENCOUNTER — Non-Acute Institutional Stay (SKILLED_NURSING_FACILITY): Payer: Medicare Other | Admitting: Internal Medicine

## 2014-08-08 DIAGNOSIS — I10 Essential (primary) hypertension: Secondary | ICD-10-CM

## 2014-08-08 DIAGNOSIS — E1165 Type 2 diabetes mellitus with hyperglycemia: Secondary | ICD-10-CM | POA: Diagnosis not present

## 2014-08-08 DIAGNOSIS — E039 Hypothyroidism, unspecified: Secondary | ICD-10-CM | POA: Diagnosis not present

## 2014-08-08 DIAGNOSIS — G309 Alzheimer's disease, unspecified: Secondary | ICD-10-CM

## 2014-08-08 DIAGNOSIS — F0280 Dementia in other diseases classified elsewhere without behavioral disturbance: Secondary | ICD-10-CM

## 2014-08-08 DIAGNOSIS — K219 Gastro-esophageal reflux disease without esophagitis: Secondary | ICD-10-CM | POA: Diagnosis not present

## 2014-08-08 DIAGNOSIS — E785 Hyperlipidemia, unspecified: Secondary | ICD-10-CM

## 2014-08-08 DIAGNOSIS — M150 Primary generalized (osteo)arthritis: Secondary | ICD-10-CM

## 2014-08-08 DIAGNOSIS — M159 Polyosteoarthritis, unspecified: Secondary | ICD-10-CM

## 2014-08-14 NOTE — Progress Notes (Signed)
Patient ID: Ana Newman, female   DOB: 1935-05-19, 79 y.o.   MRN: [id number]    DATE: 08/08/14  Location:  [facility]    Place of Service: SNF [phone])   Extended Emergency Contact Information Primary Emergency Contact: Macleod,CHRISTY Address: [place]          [place]  [place] Home Phone: [phone] Relation: None Secondary Emergency Contact: [facility] Address: [address]          [place]  [place] Home Phone: [phone] Relation: None  Advanced Directive information  FULL CODE  Chief Complaint  Patient presents with  . Medical Management of Chronic Issues    HPI:  79 yo female seen today for f/u. She is a poor historian due to dementia. Hx obtained from chart. She c/o nausea but no abdominal pain. No emesis, f/c. No nursing issues. No falls  DM - A1c 7.2% in Apr 2016. CBGs 130-170s. Diet controlled  Hyperlipidemia - on statin  HTN - BP stable on lisinopril  GERD - stable off meds  Joint pain - stable on tylenol ES  Scabies exposure - permithrin cream prophylactically  Past Medical History  Diagnosis Date  . Reflux esophagitis   . Unspecified constipation   . Essential hypertension, benign   . Alzheimer's disease   . Type II or unspecified type diabetes mellitus without mention of complication, uncontrolled   . Rickets, active   . Unspecified psychosis   . Unspecified vitamin D deficiency   . Osteoarthritis     Past Surgical History  Procedure Laterality Date  . Abdominal hysterectomy    . Joint replacement Left     Patient Care Team: Hennie Duos, MD as PCP - General (Internal Medicine) Gerlene Fee, NP as Nurse Practitioner (Geriatric Medicine) [place]  Social History   Social History  . Marital Status: Married    Spouse Name: N/A  . Number of Children: N/A  . Years of Education: N/A   Occupational History  . Not on file.   Social History  Main Topics  . Smoking status: Never Smoker   . Smokeless tobacco: Not on file  . Alcohol Use: Not on file  . Drug Use: Not on file  . Sexual Activity: Not on file   Other Topics Concern  . Not on file   Social History Narrative     reports that she has never smoked. She does not have any smokeless tobacco history on file. Her alcohol and drug histories are not on file.  Immunization History  Administered Date(s) Administered  . Influenza Whole 11/09/2012  . Influenza-Unspecified 11/12/2013, 11/11/2014  . Pneumococcal-Unspecified 01/03/2008    No Known Allergies  Medications: Patient's Medications  New Prescriptions   No medications on file  Previous Medications   ACETAMINOPHEN (TYLENOL) 325 MG TABLET    Take 650 mg by mouth every 6 (six) hours as needed.   ACETAMINOPHEN (TYLENOL) 500 MG TABLET    Take 1,000 mg by mouth daily.   ASPIRIN 325 MG TABLET    Take 325 mg by mouth daily. Take 1 tablet by mouth once daily.   ATORVASTATIN (LIPITOR) 10 MG TABLET    Take 10 mg by mouth daily at 6 PM.   BISACODYL (DULCOLAX) 5 MG EC TABLET    Take 10 mg by mouth daily as needed for moderate constipation.   CHOLECALCIFEROL (VITAMIN D) 1000 UNITS TABLET    Take 2,000 Units by mouth  daily.   DOCUSATE SODIUM (COLACE) 100 MG CAPSULE    Take 100 mg by mouth daily as needed.    INSULIN GLARGINE (LANTUS) 100 UNIT/ML INJECTION    Inject 20-25 Units into the skin 2 (two) times daily. Take 25 units in the AM and 20 units in the PM   LISINOPRIL (PRINIVIL,ZESTRIL) 2.5 MG TABLET    Take 2 tablets (5 mg total) by mouth daily.   LORATADINE (CLARITIN) 10 MG TABLET    Take 10 mg by mouth daily as needed.    METOPROLOL TARTRATE (LOPRESSOR) 25 MG TABLET    Take 25 mg by mouth 2 (two) times daily. For HTN   POLYETHYLENE GLYCOL (MIRALAX / GLYCOLAX) PACKET    Take 17 g by mouth daily. Give one 17 gram by mouth once daily for constipation.   POLYVINYL ALCOHOL-POVIDONE (FRESHKOTE) 2.7-2 % SOLN    Apply 1 drop to  eye 3 (three) times daily. To both eyes   SENNA-DOCUSATE (SENOKOT S) 8.6-50 MG PER TABLET    Take 1 tablet by mouth daily. Take 2 tablets by mouth daily for constipation.  Modified Medications   No medications on file  Discontinued Medications   ALUM & MAG HYDROXIDE-SIMETH (MYLANTA) 200-200-20 MG/5ML SUSPENSION    Take 10 mLs by mouth every 12 (twelve) hours as needed for indigestion or heartburn.   ANTISEPTIC ORAL RINSE (BIOTENE) LIQD    15 mLs by Mouth Rinse route 2 (two) times daily. And every 8 hours as needed   ARTIFICIAL TEAR OINTMENT (ARTIFICIAL TEARS) OINTMENT    Place 1 drop into both eyes as needed (for dry eyes).   BENZONATATE (TESSALON) 100 MG CAPSULE    Take 2 capsules (200 mg total) by mouth 3 (three) times daily as needed for cough.   IPRATROPIUM-ALBUTEROL (DUONEB) 0.5-2.5 (3) MG/3ML SOLN    Take 3 mLs by nebulization every 6 (six) hours as needed.   LAMOTRIGINE (LAMICTAL) 100 MG TABLET    Take 50 mg by mouth daily. Reported on 01/22/2015   MECLIZINE (ANTIVERT) 25 MG TABLET    Take 25 mg by mouth 3 (three) times daily as needed for dizziness. For dizziness    Review of Systems  Unable to perform ROS: Dementia    Filed Vitals:   08/08/14 2118  BP: 147/83  Pulse: 78  Temp: 97.3 F (36.3 C)  SpO2: 95%   There is no weight on file to calculate BMI.  Physical Exam  Constitutional: She appears well-developed.  Frail appearing sitting up in bed in NAD  HENT:  Mouth/Throat: Oropharynx is clear and moist. No oropharyngeal exudate.  Eyes: Pupils are equal, round, and reactive to light. No scleral icterus.  Neck: Neck supple. Carotid bruit is not present. No tracheal deviation present. No thyromegaly present.  Cardiovascular: Normal rate, regular rhythm and intact distal pulses.  Exam reveals no gallop and no friction rub.   Murmur (1/6 SEM) heard. No LE edema b/l. no calf TTP.   Pulmonary/Chest: Effort normal and breath sounds normal. No stridor. No respiratory distress.  She has no wheezes. She has no rales.  Abdominal: Soft. Bowel sounds are normal. She exhibits no distension and no mass. There is no hepatomegaly. There is no tenderness. There is no rebound and no guarding.  obese  Musculoskeletal:  Left foot contracture  Lymphadenopathy:    She has no cervical adenopathy.  Neurological: She is alert.  Skin: Skin is warm and dry. No rash noted.  Psychiatric: She has a normal mood  and affect. Her behavior is normal.     Labs reviewed: No visits with results within 3 Month(s) from this visit. Latest known visit with results is:  Nursing Home on 03/31/2014  Component Date Value Ref Range Status  . Glucose 02/27/2014 208   Final  . BUN 02/27/2014 20  4 - 21 mg/dL Final  . Creatinine 02/27/2014 0.8  0.5 - 1.1 mg/dL Final  . Potassium 02/27/2014 4.9  3.4 - 5.3 mmol/L Final  . Sodium 02/27/2014 138  137 - 147 mmol/L Final  . Hgb A1c MFr Bld 01/15/2014 6.2* 4.0 - 6.0 % Final    No results found.   Assessment/Plan   ICD-9-CM ICD-10-CM   1. Alzheimer's dementia without behavioral disturbance, unspecified timing of dementia onset 331.0 G30.9    294.10 F02.80   2. Controlled type 2 diabetes mellitus with hyperglycemia, without long-term current use of insulin (HCC) 250.80 E11.65    790.29    3. Essential hypertension, benign 401.1 I10   4. Primary osteoarthritis involving multiple joints 715.09 M15.0   5. Hypothyroidism, unspecified hypothyroidism type 244.9 E03.9   6. Gastroesophageal reflux disease without esophagitis 530.81 K21.9   7. Dyslipidemia 272.4 E78.5     Check CMP, lipid panel, A1c and TSH  Cont current meds as ordered  PT/OT/ST as indicated  Nutritional supplements as ordered  Will follow  Siriyah Ambrosius S. Perlie Gold  [facility] [address] [place], [place] [address] [phone] Cell (Monday-Friday 8 AM - 5 PM) [phone] After 5 PM and follow prompts

## 2014-09-12 ENCOUNTER — Non-Acute Institutional Stay (SKILLED_NURSING_FACILITY): Payer: Medicare Other | Admitting: Adult Health

## 2014-09-12 DIAGNOSIS — I10 Essential (primary) hypertension: Secondary | ICD-10-CM

## 2014-09-12 DIAGNOSIS — G309 Alzheimer's disease, unspecified: Secondary | ICD-10-CM

## 2014-09-12 DIAGNOSIS — F329 Major depressive disorder, single episode, unspecified: Secondary | ICD-10-CM | POA: Diagnosis not present

## 2014-09-12 DIAGNOSIS — E785 Hyperlipidemia, unspecified: Secondary | ICD-10-CM | POA: Diagnosis not present

## 2014-09-12 DIAGNOSIS — F32A Depression, unspecified: Secondary | ICD-10-CM

## 2014-09-12 DIAGNOSIS — M159 Polyosteoarthritis, unspecified: Secondary | ICD-10-CM

## 2014-09-12 DIAGNOSIS — E119 Type 2 diabetes mellitus without complications: Secondary | ICD-10-CM | POA: Diagnosis not present

## 2014-09-12 DIAGNOSIS — M150 Primary generalized (osteo)arthritis: Secondary | ICD-10-CM | POA: Diagnosis not present

## 2014-09-12 DIAGNOSIS — F29 Unspecified psychosis not due to a substance or known physiological condition: Secondary | ICD-10-CM | POA: Diagnosis not present

## 2014-09-12 DIAGNOSIS — F0280 Dementia in other diseases classified elsewhere without behavioral disturbance: Secondary | ICD-10-CM

## 2014-10-22 ENCOUNTER — Non-Acute Institutional Stay (SKILLED_NURSING_FACILITY): Payer: Medicare Other | Admitting: Adult Health

## 2014-10-22 DIAGNOSIS — I10 Essential (primary) hypertension: Secondary | ICD-10-CM | POA: Diagnosis not present

## 2014-10-22 DIAGNOSIS — F329 Major depressive disorder, single episode, unspecified: Secondary | ICD-10-CM

## 2014-10-22 DIAGNOSIS — F0280 Dementia in other diseases classified elsewhere without behavioral disturbance: Secondary | ICD-10-CM

## 2014-10-22 DIAGNOSIS — M150 Primary generalized (osteo)arthritis: Secondary | ICD-10-CM | POA: Diagnosis not present

## 2014-10-22 DIAGNOSIS — E785 Hyperlipidemia, unspecified: Secondary | ICD-10-CM

## 2014-10-22 DIAGNOSIS — M159 Polyosteoarthritis, unspecified: Secondary | ICD-10-CM

## 2014-10-22 DIAGNOSIS — G309 Alzheimer's disease, unspecified: Secondary | ICD-10-CM

## 2014-10-22 DIAGNOSIS — F32A Depression, unspecified: Secondary | ICD-10-CM

## 2014-10-25 ENCOUNTER — Encounter: Payer: Self-pay | Admitting: Adult Health

## 2014-10-25 NOTE — Progress Notes (Signed)
Patient ID: Ana Newman, female   DOB: October 07, 1935, 79 y.o.   MRN: [id number]   Facility: [facility]      No Known Allergies  Chief Complaint  Patient presents with  . Medical Management of Chronic Issues    HPI:  She is a long term resident of this facility being seen for the management of her chronic illnesses. She is remaining stable. She is not voicing any complaints or concerns. There are no nursing concerns being voiced today.    Past Medical History  Diagnosis Date  . Reflux esophagitis   . Unspecified constipation   . Essential hypertension, benign   . Alzheimer's disease   . Type II or unspecified type diabetes mellitus without mention of complication, uncontrolled   . Rickets, active   . Unspecified psychosis   . Unspecified vitamin D deficiency   . Osteoarthritis     Past Surgical History  Procedure Laterality Date  . Abdominal hysterectomy    . Joint replacement Left     VITAL SIGNS BP 134/76 mmHg  Pulse 72  Ht 4\' 11"  (1.499 m)  Wt 175 lb (79.379 kg)  BMI 35.33 kg/m2  SpO2 95%  Patient's Medications  New Prescriptions   No medications on file  Previous Medications   ACETAMINOPHEN (TYLENOL) 325 MG TABLET    Take 650 mg by mouth every 6 (six) hours as needed.   ACETAMINOPHEN (TYLENOL) 500 MG TABLET    Take 1,000 mg by mouth daily.   ASPIRIN 325 MG TABLET    Take 325 mg by mouth daily. Take 1 tablet by mouth once daily.   BISACODYL (DULCOLAX) 5 MG EC TABLET    Take 10 mg by mouth daily as needed for moderate constipation.   CHOLECALCIFEROL (VITAMIN D) 1000 UNITS TABLET    Take 2,000 Units by mouth daily.   DOCUSATE SODIUM (COLACE) 100 MG CAPSULE    Take 100 mg by mouth daily. Take 1 tablet by mouth daily as needed for constipation.   INSULIN GLARGINE (LANTUS) 100 UNIT/ML INJECTION    Inject 20 Units into the skin 2 (two) times daily.    LAMOTRIGINE (LAMICTAL) 100 MG TABLET    Take 50 mg by mouth daily. Take 1 tablet daily by mouth for  mood disorder.   LISINOPRIL (PRINIVIL,ZESTRIL) 2.5 MG TABLET    Take 2 tablets (5 mg total) by mouth daily.   LORATADINE (CLARITIN) 10 MG TABLET    Take 10 mg by mouth daily. Give 1 tablet every 24 hours as needed for allergies.   METOPROLOL TARTRATE (LOPRESSOR) 25 MG TABLET    Take 25 mg by mouth 2 (two) times daily. For HTN   POLYETHYLENE GLYCOL (MIRALAX / GLYCOLAX) PACKET    Take 17 g by mouth daily. Give one 17 gram by mouth once daily for constipation.   POLYVINYL ALCOHOL-POVIDONE (FRESHKOTE) 2.7-2 % SOLN    Apply 1 drop to eye 3 (three) times daily. To both eyes   SENNA-DOCUSATE (SENOKOT S) 8.6-50 MG PER TABLET    Take 1 tablet by mouth daily. Take 2 tablets by mouth daily for constipation.  Modified Medications   No medications on file  Discontinued Medications     SIGNIFICANT DIAGNOSTIC EXAMS  LABS REVIEWED:   12-06-13: urine  For micro albumin 2.3 01-15-14: glucose 145; bun 22.4; creat 0.89; k+4.8; na++138; liver normal albumin 4.0 05-31-14: hgb a1c 7.2  08-09-14: glucose 128; bun 13.9; creat 0.58; k+ 5.0; na++136; liver normal albumin 3.2; tsh  3.36;   Chol 205; ldl 108; trig 309; hdl 35      Review of Systems  Constitutional: Negative for appetite change and fatigue.  Respiratory: Negative for cough, chest tightness and shortness of breath.   Cardiovascular: Negative for chest pain, palpitations and leg swelling.  Gastrointestinal: Negative for nausea, abdominal pain, diarrhea and constipation.  Musculoskeletal: Negative for myalgias and arthralgias.  Skin: Negative for pallor.  Psychiatric/Behavioral: The patient is not nervous/anxious.       Physical Exam  Constitutional: No distress.  Eyes: Conjunctivae are normal.  Neck: Neck supple. No JVD present. No thyromegaly present.  Cardiovascular: Normal rate, regular rhythm and intact distal pulses.   Respiratory: Effort normal and breath sounds normal. No respiratory distress. She has no wheezes.  GI: Soft. Bowel  sounds are normal. She exhibits no distension. There is no tenderness.  Musculoskeletal: She exhibits no edema.  Able to move all extremities   Lymphadenopathy:    She has no cervical adenopathy.  Neurological: She is alert.  Skin: Skin is warm and dry. She is not diaphoretic.  Psychiatric: She has a normal mood and affect.       ASSESSMENT/ PLAN:  1. Hypertension: will continue lisinopril 2. 5 mg daily; and lopressor 25 mg twice daily and asa 325 mg daily   2. Diabetes: will continue lantus 20 units twice daily  ; hgb a1c is 7.2  3. Depression: will continue colace twice daily and miralax daily and senna s 2 tabs daily   4. Dyslipidemia: is presently not on medications; will monitor  5. Osteoarthritis: pain is presently being managed; will continue tylenol 1 gm daily and 650 mg every 6 hours as needed  6. Depression with psychosis: is presently stable will continue lamictal 50 mg daily to stabilize mood  7. Alzheimer's disease: no change in status; is presently not taking medications will not make changes will monitor her status.     Ok Edwards NP [facility]  Contact [phone] Monday through Friday 8am- 5pm  After hours call [phone]

## 2014-10-25 NOTE — Progress Notes (Signed)
Patient ID: Ana Newman, female   DOB: 1935-07-07, 79 y.o.   MRN: [id number]    Facility: [facility]      No Known Allergies  Chief Complaint  Patient presents with  . Medical Management of Chronic Issues    HPI:  She is a long term resident of this facility being seen for the management of her chronic illnesses. Overall she is without significant change in her status. She is not voicing any complaints or concerns today. There are no nursing concerns at this time   Past Medical History  Diagnosis Date  . Reflux esophagitis   . Unspecified constipation   . Essential hypertension, benign   . Alzheimer's disease   . Type II or unspecified type diabetes mellitus without mention of complication, uncontrolled   . Rickets, active   . Unspecified psychosis   . Unspecified vitamin D deficiency   . Osteoarthritis     Past Surgical History  Procedure Laterality Date  . Abdominal hysterectomy    . Joint replacement Left     VITAL SIGNS BP 122/64 mmHg  Pulse 86  Ht 4\' 11"  (1.499 m)  Wt 175 lb (79.379 kg)  BMI 35.33 kg/m2  SpO2 94%  Patient's Medications  New Prescriptions   No medications on file  Previous Medications   ACETAMINOPHEN (TYLENOL) 325 MG TABLET    Take 650 mg by mouth every 6 (six) hours as needed.   ACETAMINOPHEN (TYLENOL) 500 MG TABLET    Take 1,000 mg by mouth daily.   ASPIRIN 325 MG TABLET    Take 325 mg by mouth daily. Take 1 tablet by mouth once daily.   BISACODYL (DULCOLAX) 5 MG EC TABLET    Take 10 mg by mouth daily as needed for moderate constipation.   CHOLECALCIFEROL (VITAMIN D) 1000 UNITS TABLET    Take 2,000 Units by mouth daily.   DOCUSATE SODIUM (COLACE) 100 MG CAPSULE    Take 100 mg by mouth daily. Take 1 tablet by mouth daily as needed for constipation.   INSULIN GLARGINE (LANTUS) 100 UNIT/ML INJECTION    Inject 20-25 Units into the skin 2 (two) times daily. Take 25 units in the AM and 20 units in the PM   LISINOPRIL  (PRINIVIL,ZESTRIL) 2.5 MG TABLET    Take 2 tablets (5 mg total) by mouth daily.   LORATADINE (CLARITIN) 10 MG TABLET    Take 10 mg by mouth daily. Give 1 tablet every 24 hours as needed for allergies.   METOPROLOL TARTRATE (LOPRESSOR) 25 MG TABLET    Take 25 mg by mouth 2 (two) times daily. For HTN   POLYETHYLENE GLYCOL (MIRALAX / GLYCOLAX) PACKET    Take 17 g by mouth daily. Give one 17 gram by mouth once daily for constipation.   POLYVINYL ALCOHOL-POVIDONE (FRESHKOTE) 2.7-2 % SOLN    Apply 1 drop to eye 3 (three) times daily. To both eyes   SENNA-DOCUSATE (SENOKOT S) 8.6-50 MG PER TABLET    Take 1 tablet by mouth daily. Take 2 tablets by mouth daily for constipation.  Modified Medications   No medications on file  Discontinued Medications   No medications on file     SIGNIFICANT DIAGNOSTIC EXAMS   LABS REVIEWED:   12-06-13: urine  For micro albumin 2.3 01-15-14: glucose 145; bun 22.4; creat 0.89; k+4.8; na++138; liver normal albumin 4.0 05-31-14: hgb a1c 7.2  08-09-14: glucose 128; bun 13.9; creat 0.58; k+ 5.0; na++136; liver normal albumin 3.2; tsh 3.36;  Chol 205; ldl 108; trig 309; hdl 35      Review of Systems Constitutional: Negative for appetite change and fatigue.  Respiratory: Negative for cough, chest tightness and shortness of breath.   Cardiovascular: Negative for chest pain, palpitations and leg swelling.  Gastrointestinal: Negative for nausea, abdominal pain, diarrhea and constipation.  Musculoskeletal: Negative for myalgias and arthralgias.  Skin: Negative for pallor.  Psychiatric/Behavioral: The patient is not nervous/anxious.       Physical Exam Constitutional: No distress.  Eyes: Conjunctivae are normal.  Neck: Neck supple. No JVD present. No thyromegaly present.  Cardiovascular: Normal rate, regular rhythm and intact distal pulses.   Respiratory: Effort normal and breath sounds normal. No respiratory distress. She has no wheezes.  GI: Soft. Bowel sounds  are normal. She exhibits no distension. There is no tenderness.  Musculoskeletal: She exhibits no edema.  Able to move all extremities   Lymphadenopathy:    She has no cervical adenopathy.  Neurological: She is alert.  Skin: Skin is warm and dry. She is not diaphoretic.  Psychiatric: She has a normal mood and affect.    ASSESSMENT/ PLAN:  1. Hypertension: will continue lisinopril 2. 5 mg daily; and lopressor 25 mg twice daily and asa 325 mg daily   2. Diabetes: will continue lantus 20 units twice daily  ; hgb a1c is 7.2  3. Depression: will continue colace twice daily and miralax daily and senna s 2 tabs daily   4. Dyslipidemia: is presently not on medications; will monitor  5. Osteoarthritis: pain is presently being managed; will continue tylenol 1 gm daily and 650 mg every 6 hours as needed  6. Depression with psychosis: is presently stable will continue lamictal 50 mg daily to stabilize mood  7. Alzheimer's disease: no change in status; is presently not taking medications will not make changes will monitor her status.     Ok Edwards NP [facility]  Contact [phone] Monday through Friday 8am- 5pm  After hours call [phone]

## 2014-11-20 ENCOUNTER — Non-Acute Institutional Stay (SKILLED_NURSING_FACILITY): Payer: Medicare Other | Admitting: Adult Health

## 2014-11-20 DIAGNOSIS — E119 Type 2 diabetes mellitus without complications: Secondary | ICD-10-CM | POA: Diagnosis not present

## 2014-11-20 DIAGNOSIS — G301 Alzheimer's disease with late onset: Secondary | ICD-10-CM

## 2014-11-20 DIAGNOSIS — I10 Essential (primary) hypertension: Secondary | ICD-10-CM | POA: Diagnosis not present

## 2014-11-20 DIAGNOSIS — M159 Polyosteoarthritis, unspecified: Secondary | ICD-10-CM

## 2014-11-20 DIAGNOSIS — E785 Hyperlipidemia, unspecified: Secondary | ICD-10-CM

## 2014-11-20 DIAGNOSIS — F0280 Dementia in other diseases classified elsewhere without behavioral disturbance: Secondary | ICD-10-CM | POA: Diagnosis not present

## 2014-11-20 DIAGNOSIS — M150 Primary generalized (osteo)arthritis: Secondary | ICD-10-CM | POA: Diagnosis not present

## 2014-11-20 DIAGNOSIS — Z794 Long term (current) use of insulin: Secondary | ICD-10-CM

## 2014-11-30 ENCOUNTER — Encounter: Payer: Self-pay | Admitting: Adult Health

## 2014-11-30 NOTE — Progress Notes (Signed)
Patient ID: Ana Newman, female   DOB: 10-Oct-1935, 79 y.o.   MRN: [id number]    Facility: [facility]      No Known Allergies  Chief Complaint  Patient presents with  . Medical Management of Chronic Issues    HPI:  She is a long term resident of this facility being seen for the management of her chronic illnesses. Overall her status is without change. She is not voicing any concerns or complaints today; stating that she is doing well. There are no nursing concerns at this time.    Past Medical History  Diagnosis Date  . Reflux esophagitis   . Unspecified constipation   . Essential hypertension, benign   . Alzheimer's disease   . Type II or unspecified type diabetes mellitus without mention of complication, uncontrolled   . Rickets, active   . Unspecified psychosis   . Unspecified vitamin D deficiency   . Osteoarthritis     Past Surgical History  Procedure Laterality Date  . Abdominal hysterectomy    . Joint replacement Left     VITAL SIGNS BP 139/77 mmHg  Pulse 80  Ht 4\' 11"  (1.499 m)  Wt 168 lb (76.204 kg)  BMI 33.91 kg/m2  SpO2 94%  Patient's Medications  New Prescriptions   No medications on file  Previous Medications   ACETAMINOPHEN (TYLENOL) 325 MG TABLET    Take 650 mg by mouth every 6 (six) hours as needed.   ACETAMINOPHEN (TYLENOL) 500 MG TABLET    Take 1,000 mg by mouth daily.   ASPIRIN 325 MG TABLET    Take 325 mg by mouth daily. Take 1 tablet by mouth once daily.   BISACODYL (DULCOLAX) 5 MG EC TABLET    Take 10 mg by mouth daily as needed for moderate constipation.   CHOLECALCIFEROL (VITAMIN D) 1000 UNITS TABLET    Take 2,000 Units by mouth daily.   DOCUSATE SODIUM (COLACE) 100 MG CAPSULE    Take 100 mg by mouth daily. Take 1 tablet by mouth daily as needed for constipation.   INSULIN GLARGINE (LANTUS) 100 UNIT/ML INJECTION    Inject 20-25 Units into the skin 2 (two) times daily. Take 25 units in the AM and 20 units in the PM   LAMOTRIGINE (LAMICTAL) 100 MG TABLET    Take 50 mg by mouth daily. Take 1 tablet daily by mouth for mood disorder.   LISINOPRIL (PRINIVIL,ZESTRIL) 2.5 MG TABLET    Take 2 tablets (5 mg total) by mouth daily.   LORATADINE (CLARITIN) 10 MG TABLET    Take 10 mg by mouth daily. Give 1 tablet every 24 hours as needed for allergies.   METOPROLOL TARTRATE (LOPRESSOR) 25 MG TABLET    Take 25 mg by mouth 2 (two) times daily. For HTN   POLYETHYLENE GLYCOL (MIRALAX / GLYCOLAX) PACKET    Take 17 g by mouth daily. Give one 17 gram by mouth once daily for constipation.   POLYVINYL ALCOHOL-POVIDONE (FRESHKOTE) 2.7-2 % SOLN    Apply 1 drop to eye 3 (three) times daily. To both eyes   SENNA-DOCUSATE (SENOKOT S) 8.6-50 MG PER TABLET    Take 1 tablet by mouth daily. Take 2 tablets by mouth daily for constipation.  Modified Medications   No medications on file  Discontinued Medications   No medications on file     SIGNIFICANT DIAGNOSTIC EXAMS   LABS REVIEWED:   12-06-13: urine  For micro albumin 2.3 01-15-14: glucose 145; bun 22.4; creat  0.89; k+4.8; na++138; liver normal albumin 4.0 05-31-14: hgb a1c 7.2  08-09-14: glucose 128; bun 13.9; creat 0.58; k+ 5.0; na++136; liver normal albumin 3.2; tsh 3.36;   Chol 205; ldl 108; trig 309; hdl 35      Review of Systems Constitutional: Negative for appetite change and fatigue.  Respiratory: Negative for cough, chest tightness and shortness of breath.   Cardiovascular: Negative for chest pain, palpitations and leg swelling.  Gastrointestinal: Negative for nausea, abdominal pain, diarrhea and constipation.  Musculoskeletal: Negative for myalgias and arthralgias.  Skin: Negative for pallor.  Psychiatric/Behavioral: The patient is not nervous/anxious.       Physical Exam Constitutional: No distress.  Eyes: Conjunctivae are normal.  Neck: Neck supple. No JVD present. No thyromegaly present.  Cardiovascular: Normal rate, regular rhythm and intact distal  pulses.   Respiratory: Effort normal and breath sounds normal. No respiratory distress. She has no wheezes.  GI: Soft. Bowel sounds are normal. She exhibits no distension. There is no tenderness.  Musculoskeletal: She exhibits no edema.  Able to move all extremities   Lymphadenopathy:    She has no cervical adenopathy.  Neurological: She is alert.  Skin: Skin is warm and dry. She is not diaphoretic.  Psychiatric: She has a normal mood and affect.     ASSESSMENT/ PLAN:  1. Hypertension: will continue lisinopril 2. 5 mg daily; and lopressor 25 mg twice daily and asa 325 mg daily   2. Diabetes: will continue lantus 20 units twice daily  ; hgb a1c is 7.2  3. Depression: will continue colace twice daily and miralax daily and senna s 2 tabs daily   4. Dyslipidemia: is presently not on medications; will monitor  5. Osteoarthritis: pain is presently being managed; will continue tylenol 1 gm daily and 650 mg every 6 hours as needed  6. Depression with psychosis: is presently stable will continue lamictal 50 mg daily to stabilize mood  7. Alzheimer's disease: no change in status; is presently not taking medications will not make changes will monitor her status.    Will check cbc; hgb a1c     Ok Edwards NP [facility]  Contact [phone] Monday through Friday 8am- 5pm  After hours call [phone]

## 2014-12-09 ENCOUNTER — Non-Acute Institutional Stay (SKILLED_NURSING_FACILITY): Payer: Medicare Other | Admitting: Internal Medicine

## 2014-12-09 ENCOUNTER — Encounter: Payer: Self-pay | Admitting: Internal Medicine

## 2014-12-09 DIAGNOSIS — E119 Type 2 diabetes mellitus without complications: Secondary | ICD-10-CM | POA: Diagnosis not present

## 2014-12-09 DIAGNOSIS — E559 Vitamin D deficiency, unspecified: Secondary | ICD-10-CM | POA: Diagnosis not present

## 2014-12-09 DIAGNOSIS — E785 Hyperlipidemia, unspecified: Secondary | ICD-10-CM

## 2014-12-09 DIAGNOSIS — Z794 Long term (current) use of insulin: Secondary | ICD-10-CM

## 2014-12-09 NOTE — Assessment & Plan Note (Signed)
Pt on Vit D supp 2000 u daily; Plan - check vit D level

## 2014-12-09 NOTE — Assessment & Plan Note (Signed)
In 08/2014 LDL 108, HDL 35; will start lipitor 10 mg daily and FLP in 3 months

## 2014-12-09 NOTE — Assessment & Plan Note (Signed)
In 11/2014 A1c is 7.1 which is stable on lants 20 u BID; on ACE; will start statin

## 2014-12-09 NOTE — Progress Notes (Signed)
MRN: [id number] Name: Ana Newman  Sex: female Age: 79 y.o. DOB: 09/28/1935  [place] #: [facility] Facility/Room:115A Level Of Care: SNF Provider: Inocencio Homes D Emergency Contacts: Extended Emergency Contact Information Primary Emergency Contact: Neidig,CHRISTY Address: [place]          [place]  [place] Home Phone: [phone] Relation: None Secondary Emergency Contact: [facility] Address: [address]          [place]  [place] Home Phone: [phone] Relation: None  Code Status:   Allergies: Review of patient's allergies indicates no known allergies.  Chief Complaint  Patient presents with  . Medical Management of Chronic Issues    HPI: Patient is 79 y.o. female with GERD,HTN,alzheimers, DM2 , psychosis, Vit D def, being seen today for routine issues of DM2, HLD and vit D def. , Past Medical History  Diagnosis Date  . Reflux esophagitis   . Unspecified constipation   . Essential hypertension, benign   . Alzheimer's disease   . Type II or unspecified type diabetes mellitus without mention of complication, uncontrolled   . Rickets, active   . Unspecified psychosis   . Unspecified vitamin D deficiency   . Osteoarthritis     Past Surgical History  Procedure Laterality Date  . Abdominal hysterectomy    . Joint replacement Left       Medication List       This list is accurate as of: 12/09/14  9:44 PM.  Always use your most recent med list.               acetaminophen 325 MG tablet  Commonly known as:  TYLENOL  Take 650 mg by mouth every 6 (six) hours as needed.     acetaminophen 500 MG tablet  Commonly known as:  TYLENOL  Take 1,000 mg by mouth daily.     aspirin 325 MG tablet  Take 325 mg by mouth daily. Take 1 tablet by mouth once daily.     bisacodyl 5 MG EC tablet  Commonly known as:  DULCOLAX  Take 10 mg by mouth daily as needed for moderate constipation.     cholecalciferol 1000 UNITS tablet  Commonly known as:   VITAMIN D  Take 2,000 Units by mouth daily.     docusate sodium 100 MG capsule  Commonly known as:  COLACE  Take 100 mg by mouth daily. Take 1 tablet by mouth daily as needed for constipation.     FRESHKOTE 2.7-2 % Soln  Generic drug:  Polyvinyl Alcohol-Povidone  Apply 1 drop to eye 3 (three) times daily. To both eyes     insulin glargine 100 UNIT/ML injection  Commonly known as:  LANTUS  Inject 20-25 Units into the skin 2 (two) times daily. Take 25 units in the AM and 20 units in the PM     LAMICTAL 100 MG tablet  Generic drug:  lamoTRIgine  Take 50 mg by mouth daily. Take 1 tablet daily by mouth for mood disorder.     lisinopril 2.5 MG tablet  Commonly known as:  PRINIVIL,ZESTRIL  Take 2 tablets (5 mg total) by mouth daily.     loratadine 10 MG tablet  Commonly known as:  CLARITIN  Take 10 mg by mouth daily. Give 1 tablet every 24 hours as needed for allergies.     metoprolol tartrate 25 MG tablet  Commonly known as:  LOPRESSOR  Take 25 mg by mouth 2 (two) times daily. For HTN     polyethylene  glycol packet  Commonly known as:  MIRALAX / GLYCOLAX  Take 17 g by mouth daily. Give one 17 gram by mouth once daily for constipation.     SENOKOT S 8.6-50 MG tablet  Generic drug:  senna-docusate  Take 1 tablet by mouth daily. Take 2 tablets by mouth daily for constipation.        No orders of the defined types were placed in this encounter.    Immunization History  Administered Date(s) Administered  . Influenza Whole 11/09/2012  . Influenza-Unspecified 11/12/2013, 11/11/2014  . Pneumococcal-Unspecified 01/03/2008    Social History  Substance Use Topics  . Smoking status: Never Smoker   . Smokeless tobacco: Not on file  . Alcohol Use: Not on file    Review of Systems  DATA OBTAINED: from patient, nurse- no c/o or concern GENERAL:  no fevers, fatigue, appetite changes SKIN: No itching, rash HEENT: No complaint RESPIRATORY: No cough, wheezing, SOB CARDIAC: No  chest pain, palpitations, lower extremity edema  GI: No abdominal pain, No N/V/D or constipation, No heartburn or reflux  GU: No dysuria, frequency or urgency, or incontinence  MUSCULOSKELETAL: No unrelieved bone/joint pain NEUROLOGIC: No headache, dizziness  PSYCHIATRIC: No overt anxiety or sadness  Filed Vitals:   12/09/14 1654  BP: 118/71  Pulse: 76  Temp: 97.5 F (36.4 C)  Resp: 16    Physical Exam  GENERAL APPEARANCE: Alert, conversant, obese WF,No acute distress  SKIN: No diaphoresis rash HEENT: Unremarkable RESPIRATORY: Breathing is even, unlabored. Lung sounds are clear   CARDIOVASCULAR: Heart RRR no murmurs, rubs or gallops. No peripheral edema  GASTROINTESTINAL: Abdomen is soft, non-tender, not distended w/ normal bowel sounds.  GENITOURINARY: Bladder non tender, not distended  MUSCULOSKELETAL: atrophy BLE NEUROLOGIC: Cranial nerves 2-12 grossly intact; very dec movement BLE PSYCHIATRIC: Mood and affect appropriate to situation, no behavioral issues  Patient Active Problem List   Diagnosis Date Noted  . Alzheimer's disease 07/10/2014  . Diabetes mellitus type II, controlled ([place]) 06/27/2014  . Dyslipidemia 03/04/2014  . Depression 01/18/2014  . Insomnia 01/18/2014  . Hypothyroidism 01/18/2014  . Essential hypertension, benign   . Psychosis   . Vitamin D deficiency   . Osteoarthritis     CBC    Component Value Date/Time   WBC 8.1 07/25/2013   HGB 12.9 07/25/2013   HCT 42 07/25/2013   PLT 295 07/25/2013    CMP     Component Value Date/Time   NA 138 02/27/2014   K 4.9 02/27/2014   BUN 20 02/27/2014   CREATININE 0.8 02/27/2014   AST 17 07/25/2013   ALT 23 07/25/2013   ALKPHOS 61 07/25/2013    Assessment and Plan  Diabetes mellitus type II, controlled In 11/2014 A1c is 7.1 which is stable on lants 20 u BID; on ACE; will start statin  Dyslipidemia In 08/2014 LDL 108, HDL 35; will start lipitor 10 mg daily and FLP in 3 months  Vitamin D  deficiency Pt on Vit D supp 2000 u daily; Plan - check vit D level    Hennie Duos, MD

## 2014-12-11 DIAGNOSIS — E169 Disorder of pancreatic internal secretion, unspecified: Secondary | ICD-10-CM | POA: Diagnosis not present

## 2014-12-11 DIAGNOSIS — E1165 Type 2 diabetes mellitus with hyperglycemia: Secondary | ICD-10-CM | POA: Diagnosis not present

## 2014-12-11 DIAGNOSIS — E875 Hyperkalemia: Secondary | ICD-10-CM | POA: Diagnosis not present

## 2014-12-11 DIAGNOSIS — E784 Other hyperlipidemia: Secondary | ICD-10-CM | POA: Diagnosis not present

## 2015-01-02 ENCOUNTER — Non-Acute Institutional Stay (SKILLED_NURSING_FACILITY): Payer: Medicare Other | Admitting: Adult Health

## 2015-01-02 DIAGNOSIS — I10 Essential (primary) hypertension: Secondary | ICD-10-CM | POA: Diagnosis not present

## 2015-01-02 DIAGNOSIS — G301 Alzheimer's disease with late onset: Secondary | ICD-10-CM

## 2015-01-02 DIAGNOSIS — M150 Primary generalized (osteo)arthritis: Secondary | ICD-10-CM | POA: Diagnosis not present

## 2015-01-02 DIAGNOSIS — E034 Atrophy of thyroid (acquired): Secondary | ICD-10-CM | POA: Diagnosis not present

## 2015-01-02 DIAGNOSIS — E785 Hyperlipidemia, unspecified: Secondary | ICD-10-CM

## 2015-01-02 DIAGNOSIS — F29 Unspecified psychosis not due to a substance or known physiological condition: Secondary | ICD-10-CM | POA: Diagnosis not present

## 2015-01-02 DIAGNOSIS — E038 Other specified hypothyroidism: Secondary | ICD-10-CM

## 2015-01-02 DIAGNOSIS — F0280 Dementia in other diseases classified elsewhere without behavioral disturbance: Secondary | ICD-10-CM

## 2015-01-02 DIAGNOSIS — M159 Polyosteoarthritis, unspecified: Secondary | ICD-10-CM

## 2015-01-22 ENCOUNTER — Encounter: Payer: Self-pay | Admitting: Adult Health

## 2015-01-22 NOTE — Progress Notes (Signed)
Patient ID: Ana Newman, female   DOB: 06/12/1935, 79 y.o.   MRN: [id number]    Facility:  [facility]      No Known Allergies  Chief Complaint  Patient presents with  . Medical Management of Chronic Issues    HPI:  She is a long term resident of this facility being seen for the management of her chronic illnesses. Overall there is little change in her status. She is not voicing any complaints or concerns at this time. There are no nursing concerns at this time.    Past Medical History  Diagnosis Date  . Reflux esophagitis   . Unspecified constipation   . Essential hypertension, benign   . Alzheimer's disease   . Type II or unspecified type diabetes mellitus without mention of complication, uncontrolled   . Rickets, active   . Unspecified psychosis   . Unspecified vitamin D deficiency   . Osteoarthritis     Past Surgical History  Procedure Laterality Date  . Abdominal hysterectomy    . Joint replacement Left     VITAL SIGNS BP 139/77 mmHg  Pulse 78  Ht 4\' 11"  (1.499 m)  Wt 165 lb (74.844 kg)  BMI 33.31 kg/m2  Patient's Medications  New Prescriptions   No medications on file  Previous Medications   ACETAMINOPHEN (TYLENOL) 325 MG TABLET    Take 650 mg by mouth every 6 (six) hours as needed.   ACETAMINOPHEN (TYLENOL) 500 MG TABLET    Take 1,000 mg by mouth daily.   ASPIRIN 325 MG TABLET    Take 325 mg by mouth daily. Take 1 tablet by mouth once daily.   ATORVASTATIN (LIPITOR) 10 MG TABLET    Take 10 mg by mouth daily at 6 PM.   BISACODYL (DULCOLAX) 5 MG EC TABLET    Take 10 mg by mouth daily as needed for moderate constipation.   CHOLECALCIFEROL (VITAMIN D) 1000 UNITS TABLET    Take 2,000 Units by mouth daily.   DOCUSATE SODIUM (COLACE) 100 MG CAPSULE    Take 100 mg by mouth daily. Take 1 tablet by mouth daily as needed for constipation.   INSULIN GLARGINE (LANTUS) 100 UNIT/ML INJECTION    Inject 20-25 Units into the skin 2 (two) times daily. Take 25 units in  the AM and 20 units in the PM   LISINOPRIL (PRINIVIL,ZESTRIL) 2.5 MG TABLET    Take 2 tablets (5 mg total) by mouth daily.   LORATADINE (CLARITIN) 10 MG TABLET    Take 10 mg by mouth daily. Give 1 tablet every 24 hours as needed for allergies.   METOPROLOL TARTRATE (LOPRESSOR) 25 MG TABLET    Take 25 mg by mouth 2 (two) times daily. For HTN   POLYETHYLENE GLYCOL (MIRALAX / GLYCOLAX) PACKET    Take 17 g by mouth daily. Give one 17 gram by mouth once daily for constipation.   POLYVINYL ALCOHOL-POVIDONE (FRESHKOTE) 2.7-2 % SOLN    Apply 1 drop to eye 3 (three) times daily. To both eyes   SENNA-DOCUSATE (SENOKOT S) 8.6-50 MG PER TABLET    Take 1 tablet by mouth daily. Take 2 tablets by mouth daily for constipation.  Modified Medications   No medications on file  Discontinued Medications   LAMOTRIGINE (LAMICTAL) 100 MG TABLET    Take 50 mg by mouth daily. Reported on 01/22/2015     SIGNIFICANT DIAGNOSTIC EXAMS    LABS REVIEWED:   12-06-13: urine  For micro albumin 2.3 01-15-14: glucose 145;  bun 22.4; creat 0.89; k+4.8; na++138; liver normal albumin 4.0 05-31-14: hgb a1c 7.2  08-09-14: glucose 128; bun 13.9; creat 0.58; k+ 5.0; na++136; liver normal albumin 3.2; tsh 3.36;   Chol 205; ldl 108; trig 309; hdl 35  12-11-14: vit d 37.32      Review of Systems Constitutional: Negative for appetite change and fatigue.  Respiratory: Negative for cough, chest tightness and shortness of breath.   Cardiovascular: Negative for chest pain, palpitations and leg swelling.  Gastrointestinal: Negative for nausea, abdominal pain, diarrhea and constipation.  Musculoskeletal: Negative for myalgias and arthralgias.  Skin: Negative for pallor.  Psychiatric/Behavioral: The patient is not nervous/anxious.       Physical Exam Constitutional: No distress.  Eyes: Conjunctivae are normal.  Neck: Neck supple. No JVD present. No thyromegaly present.  Cardiovascular: Normal rate, regular rhythm and intact distal  pulses.   Respiratory: Effort normal and breath sounds normal. No respiratory distress. She has no wheezes.  GI: Soft. Bowel sounds are normal. She exhibits no distension. There is no tenderness.  Musculoskeletal: She exhibits no edema.  Able to move all extremities   Lymphadenopathy:    She has no cervical adenopathy.  Neurological: She is alert.  Skin: Skin is warm and dry. She is not diaphoretic.  Psychiatric: She has a normal mood and affect.     ASSESSMENT/ PLAN:  1. Hypertension: will continue lisinopril 2. 5 mg daily; and lopressor 25 mg twice daily and asa 325 mg daily   2. Diabetes: will continue lantus 20 units twice daily  ; hgb a1c is 7.2  3. Depression: will continue colace twice daily and miralax daily and senna s 2 tabs daily   4. Dyslipidemia: will continue lipitor 10 mg daily ldl is 108; will monitor  5. Osteoarthritis: pain is presently being managed; will continue tylenol 1 gm daily and 650 mg every 6 hours as needed  6. Depression with psychosis: is presently stable is presently not taking medications; will monitor   7. Alzheimer's disease: no change in status; is presently not taking medications will not make changes will monitor her status.           Ok Edwards NP [facility]  Contact [phone] Monday through Friday 8am- 5pm  After hours call [phone]

## 2015-02-04 ENCOUNTER — Encounter: Payer: Self-pay | Admitting: Adult Health

## 2015-02-04 ENCOUNTER — Non-Acute Institutional Stay (SKILLED_NURSING_FACILITY): Payer: Medicare Other | Admitting: Adult Health

## 2015-02-04 DIAGNOSIS — E038 Other specified hypothyroidism: Secondary | ICD-10-CM

## 2015-02-04 DIAGNOSIS — I10 Essential (primary) hypertension: Secondary | ICD-10-CM

## 2015-02-04 DIAGNOSIS — E034 Atrophy of thyroid (acquired): Secondary | ICD-10-CM | POA: Diagnosis not present

## 2015-02-04 DIAGNOSIS — Z794 Long term (current) use of insulin: Secondary | ICD-10-CM | POA: Diagnosis not present

## 2015-02-04 DIAGNOSIS — G301 Alzheimer's disease with late onset: Secondary | ICD-10-CM

## 2015-02-04 DIAGNOSIS — E119 Type 2 diabetes mellitus without complications: Secondary | ICD-10-CM

## 2015-02-04 DIAGNOSIS — F0280 Dementia in other diseases classified elsewhere without behavioral disturbance: Secondary | ICD-10-CM

## 2015-02-04 DIAGNOSIS — E1169 Type 2 diabetes mellitus with other specified complication: Secondary | ICD-10-CM | POA: Diagnosis not present

## 2015-02-04 DIAGNOSIS — E785 Hyperlipidemia, unspecified: Secondary | ICD-10-CM

## 2015-02-04 NOTE — Progress Notes (Signed)
Patient ID: Ana Newman, female   DOB: 09/08/1935, 80 y.o.   MRN: [id number]   Facility:  [facility]      No Known Allergies  Chief Complaint  Patient presents with  . Medical Management of Chronic Issues    HPI:  She is a long term resident of this facility being seen for the management of her chronic illnesses. Overall her status is without change. She is not voicing any complaints or concerns at this time; stating that she feels good. There are no nursing concerns at this time.    Past Medical History  Diagnosis Date  . Reflux esophagitis   . Unspecified constipation   . Essential hypertension, benign   . Alzheimer's disease   . Type II or unspecified type diabetes mellitus without mention of complication, uncontrolled   . Rickets, active   . Unspecified psychosis   . Unspecified vitamin D deficiency   . Osteoarthritis     Past Surgical History  Procedure Laterality Date  . Abdominal hysterectomy    . Joint replacement Left     VITAL SIGNS BP 138/76 mmHg  Pulse 74  Temp(Src) 98 F (36.7 C)  Resp 16  Ht 4\' 11"  (1.499 m)  Wt 165 lb (74.844 kg)  BMI 33.31 kg/m2  SpO2 97%  Patient's Medications  New Prescriptions   No medications on file  Previous Medications   ACETAMINOPHEN (TYLENOL) 325 MG TABLET    Take 650 mg by mouth every 6 (six) hours as needed.   ACETAMINOPHEN (TYLENOL) 500 MG TABLET    Take 1,000 mg by mouth daily.   ASPIRIN 325 MG TABLET    Take 325 mg by mouth daily. Take 1 tablet by mouth once daily.   ATORVASTATIN (LIPITOR) 10 MG TABLET    Take 10 mg by mouth daily at 6 PM.   BISACODYL (DULCOLAX) 5 MG EC TABLET    Take 10 mg by mouth daily as needed for moderate constipation.   CHOLECALCIFEROL (VITAMIN D) 1000 UNITS TABLET    Take 2,000 Units by mouth daily.   DOCUSATE SODIUM (COLACE) 100 MG CAPSULE    Take 100 mg by mouth daily as needed.    INSULIN GLARGINE (LANTUS) 100 UNIT/ML INJECTION    Inject 20-25 Units into the skin 2 (two) times  daily. Take 25 units in the AM and 20 units in the PM   LISINOPRIL (PRINIVIL,ZESTRIL) 2.5 MG TABLET    Take 2 tablets (5 mg total) by mouth daily.   LORATADINE (CLARITIN) 10 MG TABLET    Take 10 mg by mouth daily as needed.    METOPROLOL TARTRATE (LOPRESSOR) 25 MG TABLET    Take 25 mg by mouth 2 (two) times daily. For HTN   POLYETHYLENE GLYCOL (MIRALAX / GLYCOLAX) PACKET    Take 17 g by mouth daily. Give one 17 gram by mouth once daily for constipation.   POLYVINYL ALCOHOL-POVIDONE (FRESHKOTE) 2.7-2 % SOLN    Apply 1 drop to eye 3 (three) times daily. To both eyes   SENNA-DOCUSATE (SENOKOT S) 8.6-50 MG PER TABLET     Take 2 tablets by mouth daily for constipation.  Modified Medications   No medications on file  Discontinued Medications   No medications on file     SIGNIFICANT DIAGNOSTIC EXAMS   LABS REVIEWED:   05-31-14: hgb a1c 7.2  08-09-14: glucose 128; bun 13.9; creat 0.58; k+ 5.0; na++136; liver normal albumin 3.2; tsh 3.36;   Chol 205; ldl 108; trig 309;  hdl 35  11-26-14: wbc 8.0; hgb 12.1; hct 39.0; mcv 95.7; plt 339; hgb a1c 7.1  12-11-14: vit d 37.32      Review of Systems Constitutional: Negative for appetite change and fatigue.  Respiratory: Negative for cough, chest tightness and shortness of breath.   Cardiovascular: Negative for chest pain, palpitations and leg swelling.  Gastrointestinal: Negative for nausea, abdominal pain, diarrhea and constipation.  Musculoskeletal: Negative for myalgias and arthralgias.  Skin: Negative for pallor.  Psychiatric/Behavioral: The patient is not nervous/anxious.       Physical Exam Constitutional: No distress.  Eyes: Conjunctivae are normal.  Neck: Neck supple. No JVD present. No thyromegaly present.  Cardiovascular: Normal rate, regular rhythm and intact distal pulses.   Respiratory: Effort normal and breath sounds normal. No respiratory distress. She has no wheezes.  GI: Soft. Bowel sounds are normal. She exhibits no  distension. There is no tenderness.  Musculoskeletal: She exhibits no edema.  Able to move all extremities   Lymphadenopathy:    She has no cervical adenopathy.  Neurological: She is alert.  Skin: Skin is warm and dry. She is not diaphoretic.  Psychiatric: She has a normal mood and affect.     ASSESSMENT/ PLAN:  1. Hypertension: will continue lisinopril  5 mg daily; and lopressor 25 mg twice daily and asa 325 mg daily   2. Diabetes: will continue lantus 25 units in the AM and  20 units in the PM; hgb a1c is 7.1  3. Depression: will continue colace daily prn;  miralax daily and senna s 2 tabs daily   4. Dyslipidemia: will continue lipitor 10 mg daily ldl is 108; will monitor  5. Osteoarthritis: pain is presently being managed; will continue tylenol 1 gm daily and 650 mg every 6 hours as needed  6. Depression with psychosis: is presently stable is presently not taking medications; will monitor   7. Alzheimer's disease: no change in status; is presently not taking medications will not make changes will monitor her status.  Her current weight is 165 pounds.     Will check lipids and tsh     Ok Edwards NP [facility]  Contact [phone] Monday through Friday 8am- 5pm  After hours call [phone]

## 2015-02-05 ENCOUNTER — Encounter: Payer: Self-pay | Admitting: Internal Medicine

## 2015-02-05 DIAGNOSIS — I10 Essential (primary) hypertension: Secondary | ICD-10-CM | POA: Diagnosis not present

## 2015-02-05 DIAGNOSIS — E1165 Type 2 diabetes mellitus with hyperglycemia: Secondary | ICD-10-CM | POA: Diagnosis not present

## 2015-02-05 DIAGNOSIS — E875 Hyperkalemia: Secondary | ICD-10-CM | POA: Diagnosis not present

## 2015-03-05 ENCOUNTER — Non-Acute Institutional Stay (SKILLED_NURSING_FACILITY): Payer: Medicare Other | Admitting: Adult Health

## 2015-03-05 DIAGNOSIS — E1169 Type 2 diabetes mellitus with other specified complication: Secondary | ICD-10-CM

## 2015-03-05 DIAGNOSIS — F329 Major depressive disorder, single episode, unspecified: Secondary | ICD-10-CM

## 2015-03-05 DIAGNOSIS — E038 Other specified hypothyroidism: Secondary | ICD-10-CM

## 2015-03-05 DIAGNOSIS — G308 Other Alzheimer's disease: Secondary | ICD-10-CM

## 2015-03-05 DIAGNOSIS — F0280 Dementia in other diseases classified elsewhere without behavioral disturbance: Secondary | ICD-10-CM

## 2015-03-05 DIAGNOSIS — E119 Type 2 diabetes mellitus without complications: Secondary | ICD-10-CM

## 2015-03-05 DIAGNOSIS — E034 Atrophy of thyroid (acquired): Secondary | ICD-10-CM

## 2015-03-05 DIAGNOSIS — E785 Hyperlipidemia, unspecified: Secondary | ICD-10-CM

## 2015-03-05 DIAGNOSIS — Z794 Long term (current) use of insulin: Secondary | ICD-10-CM | POA: Diagnosis not present

## 2015-03-05 DIAGNOSIS — F32A Depression, unspecified: Secondary | ICD-10-CM

## 2015-03-05 DIAGNOSIS — I10 Essential (primary) hypertension: Secondary | ICD-10-CM | POA: Diagnosis not present

## 2015-03-06 DIAGNOSIS — I10 Essential (primary) hypertension: Secondary | ICD-10-CM | POA: Diagnosis not present

## 2015-03-06 DIAGNOSIS — E1165 Type 2 diabetes mellitus with hyperglycemia: Secondary | ICD-10-CM | POA: Diagnosis not present

## 2015-03-07 DIAGNOSIS — E1165 Type 2 diabetes mellitus with hyperglycemia: Secondary | ICD-10-CM | POA: Diagnosis not present

## 2015-03-07 DIAGNOSIS — E875 Hyperkalemia: Secondary | ICD-10-CM | POA: Diagnosis not present

## 2015-03-07 DIAGNOSIS — E784 Other hyperlipidemia: Secondary | ICD-10-CM | POA: Diagnosis not present

## 2015-03-07 DIAGNOSIS — E169 Disorder of pancreatic internal secretion, unspecified: Secondary | ICD-10-CM | POA: Diagnosis not present

## 2015-04-03 ENCOUNTER — Non-Acute Institutional Stay (SKILLED_NURSING_FACILITY): Payer: Medicare Other | Admitting: Internal Medicine

## 2015-04-03 ENCOUNTER — Encounter: Payer: Self-pay | Admitting: Internal Medicine

## 2015-04-03 DIAGNOSIS — J3089 Other allergic rhinitis: Secondary | ICD-10-CM

## 2015-04-03 DIAGNOSIS — F29 Unspecified psychosis not due to a substance or known physiological condition: Secondary | ICD-10-CM | POA: Diagnosis not present

## 2015-04-03 DIAGNOSIS — E559 Vitamin D deficiency, unspecified: Secondary | ICD-10-CM | POA: Diagnosis not present

## 2015-04-03 NOTE — Progress Notes (Signed)
MRN: [id number] Name: Ana Newman  Sex: female Age: 80 y.o. DOB: 03-Sep-1935  [place] #: [facility] Facility/Room: Level Of Care: SNF Provider: Inocencio Homes D Emergency Contacts: Extended Emergency Contact Information Primary Emergency Contact: Sliney,CHRISTY Address: [place]          [place]  [place] Home Phone: [phone] Relation: None Secondary Emergency [facility]: [facility] Address: [address]          [place]  [place] Home Phone: [phone] Relation: None  Code Status:   Allergies: Review of patient's allergies indicates no known allergies.  Chief Complaint  Patient presents with  . Medical Management of Chronic Issues    HPI: Patient is 80 y.o. female who is being seen for routine issues of Vit D def, psychoses, and allergies.  Past Medical History  Diagnosis Date  . Reflux esophagitis   . Unspecified constipation   . Essential hypertension, benign   . Alzheimer's disease   . Type II or unspecified type diabetes mellitus without mention of complication, uncontrolled   . Rickets, active   . Unspecified psychosis   . Unspecified vitamin D deficiency   . Osteoarthritis     Past Surgical History  Procedure Laterality Date  . Abdominal hysterectomy    . Joint replacement Left       Medication List       This list is accurate as of: 04/03/15 11:59 PM.  Always use your most recent med list.               acetaminophen 325 MG tablet  Commonly known as:  TYLENOL  Take 650 mg by mouth every 6 (six) hours as needed.     acetaminophen 500 MG tablet  Commonly known as:  TYLENOL  Take 1,000 mg by mouth daily.     aspirin 325 MG tablet  Take 325 mg by mouth daily. Take 1 tablet by mouth once daily.     atorvastatin 10 MG tablet  Commonly known as:  LIPITOR  Take 10 mg by mouth daily at 6 PM.     bisacodyl 5 MG EC tablet  Commonly known as:  DULCOLAX  Take 10 mg by mouth daily as needed for moderate constipation.      cholecalciferol 1000 units tablet  Commonly known as:  VITAMIN D  Take 2,000 Units by mouth daily.     docusate sodium 100 MG capsule  Commonly known as:  COLACE  Take 100 mg by mouth daily as needed.     FRESHKOTE 2.7-2 % Soln  Generic drug:  Polyvinyl Alcohol-Povidone  Apply 1 drop to eye 3 (three) times daily. To both eyes     insulin glargine 100 UNIT/ML injection  Commonly known as:  LANTUS  Inject 20-25 Units into the skin 2 (two) times daily. Take 25 units in the AM and 20 units in the PM     lisinopril 2.5 MG tablet  Commonly known as:  PRINIVIL,ZESTRIL  Take 2 tablets (5 mg total) by mouth daily.     loratadine 10 MG tablet  Commonly known as:  CLARITIN  Take 10 mg by mouth daily as needed.     metoprolol tartrate 25 MG tablet  Commonly known as:  LOPRESSOR  Take 25 mg by mouth 2 (two) times daily. For HTN     polyethylene glycol packet  Commonly known as:  MIRALAX / GLYCOLAX  Take 17 g by mouth daily. Give one 17 gram by mouth once daily for constipation.  SENOKOT S 8.6-50 MG tablet  Generic drug:  senna-docusate  Take 1 tablet by mouth daily. Take 2 tablets by mouth daily for constipation.        No orders of the defined types were placed in this encounter.    Immunization History  Administered Date(s) Administered  . Influenza Whole 11/09/2012  . Influenza-Unspecified 11/12/2013, 11/11/2014  . Pneumococcal-Unspecified 01/03/2008    Social History  Substance Use Topics  . Smoking status: Never Smoker   . Smokeless tobacco: Not on file  . Alcohol Use: Not on file    Review of Systems  DATA OBTAINED: from patient, nurse GENERAL:  no fevers, fatigue, appetite changes SKIN: No itching, rash HEENT: No complaint RESPIRATORY: No cough, wheezing, SOB CARDIAC: No chest pain, palpitations, lower extremity edema  GI: No abdominal pain, No N/V/D or constipation, No heartburn or reflux  GU: No dysuria, frequency or urgency, or incontinence   MUSCULOSKELETAL: No unrelieved bone/joint pain NEUROLOGIC: No headache, dizziness  PSYCHIATRIC: No overt anxiety or sadness  Filed Vitals:   04/03/15 1339  BP: 128/67  Pulse: 73  Temp: 98.1 F (36.7 C)  Resp: 18    Physical Exam  GENERAL APPEARANCE: Alert, conversant, No acute distress  SKIN: No diaphoresis rash HEENT: Unremarkable RESPIRATORY: Breathing is even, unlabored. Lung sounds are clear   CARDIOVASCULAR: Heart RRR no murmurs, rubs or gallops. No peripheral edema  GASTROINTESTINAL: Abdomen is soft, non-tender, not distended w/ normal bowel sounds.  GENITOURINARY: Bladder non tender, not distended  MUSCULOSKELETAL: No abnormal joints or musculature NEUROLOGIC: Cranial nerves 2-12 grossly intact. Moves all extremities PSYCHIATRIC: Mood and affect appropriate to situation with dementia, no behavioral issues  Patient Active Problem List   Diagnosis Date Noted  . Environmental and seasonal allergies 04/06/2015  . Dyslipidemia associated with type 2 diabetes mellitus ([place]) 02/04/2015  . Diabetes mellitus, type II, insulin dependent ([place]) 02/04/2015  . Alzheimer's disease 07/10/2014  . Depression 01/18/2014  . Insomnia 01/18/2014  . Hypothyroidism 01/18/2014  . Essential hypertension, benign   . Psychosis   . Vitamin D deficiency   . Osteoarthritis     CBC    Component Value Date/Time   WBC 8.1 07/25/2013   HGB 12.9 07/25/2013   HCT 42 07/25/2013   PLT 295 07/25/2013    CMP     Component Value Date/Time   NA 138 02/27/2014   K 4.9 02/27/2014   BUN 20 02/27/2014   CREATININE 0.8 02/27/2014   AST 17 07/25/2013   ALT 23 07/25/2013   ALKPHOS 61 07/25/2013    Assessment and Plan  Vitamin D deficiency Vit D 32.7; plan = cont vit D 2000 u daily  Psychosis presently stable is presently not taking medications; will monitor    Environmental and seasonal allergies Controlled; cont claritin 10 mg daily    Hennie Duos, MD

## 2015-04-06 ENCOUNTER — Encounter: Payer: Self-pay | Admitting: Internal Medicine

## 2015-04-06 DIAGNOSIS — J3089 Other allergic rhinitis: Secondary | ICD-10-CM | POA: Insufficient documentation

## 2015-04-06 NOTE — Assessment & Plan Note (Signed)
Vit D 32.7; plan = cont vit D 2000 u daily

## 2015-04-06 NOTE — Assessment & Plan Note (Signed)
Controlled ; cont claritin 10 mg daily 

## 2015-04-06 NOTE — Assessment & Plan Note (Signed)
presently stable is presently not taking medications; will monitor

## 2015-04-22 NOTE — Progress Notes (Signed)
Patient ID: Ana Newman, female   DOB: [birth year], 80 y.o.   MRN: [id number]   Facility:  [facility]       No Known Allergies  Chief Complaint  Patient presents with  . Medical Management of Chronic Issues    HPI:  She is a long term resident of this facility being seen for the management of her chronic illnesses. Her status remains stable. She tells me that she is feeling good and has no complaints. There are no nursing concerns at this time.    Past Medical History  Diagnosis Date  . Reflux esophagitis   . Unspecified constipation   . Essential hypertension, benign   . Alzheimer's disease   . Type II or unspecified type diabetes mellitus without mention of complication, uncontrolled   . Rickets, active   . Unspecified psychosis   . Unspecified vitamin D deficiency   . Osteoarthritis     Past Surgical History  Procedure Laterality Date  . Abdominal hysterectomy    . Joint replacement Left     VITAL SIGNS BP 112/73 mmHg  Pulse 72  Ht 4\' 11"  (1.499 m)  Wt 165 lb (74.844 kg)  BMI 33.31 kg/m2  SpO2 96%  Patient's Medications  New Prescriptions   No medications on file  Previous Medications   ACETAMINOPHEN (TYLENOL) 325 MG TABLET    Take 650 mg by mouth every 6 (six) hours as needed.   ACETAMINOPHEN (TYLENOL) 500 MG TABLET    Take 1,000 mg by mouth daily.   ASPIRIN 325 MG TABLET    Take 325 mg by mouth daily. Take 1 tablet by mouth once daily.   ATORVASTATIN (LIPITOR) 10 MG TABLET    Take 10 mg by mouth daily at 6 PM.   BISACODYL (DULCOLAX) 5 MG EC TABLET    Take 10 mg by mouth daily as needed for moderate constipation.   CHOLECALCIFEROL (VITAMIN D) 1000 UNITS TABLET    Take 2,000 Units by mouth daily.   DOCUSATE SODIUM (COLACE) 100 MG CAPSULE    Take 100 mg by mouth daily as needed.    INSULIN GLARGINE (LANTUS) 100 UNIT/ML INJECTION    Inject 20-25 Units into the skin 2 (two) times daily. Take 25 units in the AM and 20 units in the PM   LISINOPRIL  (PRINIVIL,ZESTRIL) 2.5 MG TABLET    Take 2 tablets (5 mg total) by mouth daily.   LORATADINE (CLARITIN) 10 MG TABLET    Take 10 mg by mouth daily as needed.    METOPROLOL TARTRATE (LOPRESSOR) 25 MG TABLET    Take 25 mg by mouth 2 (two) times daily. For HTN   POLYETHYLENE GLYCOL (MIRALAX / GLYCOLAX) PACKET    Take 17 g by mouth daily. Give one 17 gram by mouth once daily for constipation.   POLYVINYL ALCOHOL-POVIDONE (FRESHKOTE) 2.7-2 % SOLN    Apply 1 drop to eye 3 (three) times daily. To both eyes   SENNA-DOCUSATE (SENOKOT S) 8.6-50 MG PER TABLET    Take 1 tablet by mouth daily. Take 2 tablets by mouth daily for constipation.  Modified Medications   No medications on file  Discontinued Medications   No medications on file     SIGNIFICANT DIAGNOSTIC EXAMS    LABS REVIEWED:   05-31-14: hgb a1c 7.2  08-09-14: glucose 128; bun 13.9; creat 0.58; k+ 5.0; na++136; liver normal albumin 3.2; tsh 3.36;   Chol 205; ldl 108; trig 309; hdl 35  11-26-14: wbc 8.0; hgb 12.1;  hct 39.0; mcv 95.7; plt 339; hgb a1c 7.1  12-11-14: vit d 37.32      Review of Systems Constitutional: Negative for appetite change and fatigue.  Respiratory: Negative for cough, chest tightness and shortness of breath.   Cardiovascular: Negative for chest pain, palpitations and leg swelling.  Gastrointestinal: Negative for nausea, abdominal pain, diarrhea and constipation.  Musculoskeletal: Negative for myalgias and arthralgias.  Skin: Negative for pallor.  Psychiatric/Behavioral: The patient is not nervous/anxious.       Physical Exam Constitutional: No distress.  Eyes: Conjunctivae are normal.  Neck: Neck supple. No JVD present. No thyromegaly present.  Cardiovascular: Normal rate, regular rhythm and intact distal pulses.   Respiratory: Effort normal and breath sounds normal. No respiratory distress. She has no wheezes.  GI: Soft. Bowel sounds are normal. She exhibits no distension. There is no tenderness.    Musculoskeletal: She exhibits no edema.  Able to move all extremities   Lymphadenopathy:    She has no cervical adenopathy.  Neurological: She is alert.  Skin: Skin is warm and dry. She is not diaphoretic.  Psychiatric: She has a normal mood and affect.     ASSESSMENT/ PLAN:  1. Hypertension: will continue lisinopril  5 mg daily; and lopressor 25 mg twice daily and asa 325 mg daily   2. Diabetes: will continue lantus 25 units in the AM and  20 units in the PM; hgb a1c is 7.1  3. Depression: will continue colace daily prn;  miralax daily and senna s 2 tabs daily   4. Dyslipidemia: will continue lipitor 10 mg daily ldl is 108; will monitor  5. Osteoarthritis: pain is presently being managed; will continue tylenol 1 gm daily and 650 mg every 6 hours as needed  6. Depression with psychosis: is presently stable is presently not taking medications; will monitor   7. Alzheimer's disease: no change in status; is presently not taking medications will not make changes will monitor her status.  Her current weight is 165 pounds.    Will check cbc; cmp hgb a1c     Ok Edwards NP [facility]  Contact [phone] Monday through Friday 8am- 5pm  After hours call [phone]

## 2015-05-07 ENCOUNTER — Encounter: Payer: Self-pay | Admitting: Adult Health

## 2015-05-07 ENCOUNTER — Non-Acute Institutional Stay (SKILLED_NURSING_FACILITY): Payer: Medicare Other | Admitting: Adult Health

## 2015-05-07 DIAGNOSIS — M150 Primary generalized (osteo)arthritis: Secondary | ICD-10-CM

## 2015-05-07 DIAGNOSIS — E119 Type 2 diabetes mellitus without complications: Secondary | ICD-10-CM

## 2015-05-07 DIAGNOSIS — E785 Hyperlipidemia, unspecified: Secondary | ICD-10-CM

## 2015-05-07 DIAGNOSIS — Z794 Long term (current) use of insulin: Secondary | ICD-10-CM

## 2015-05-07 DIAGNOSIS — F0280 Dementia in other diseases classified elsewhere without behavioral disturbance: Secondary | ICD-10-CM

## 2015-05-07 DIAGNOSIS — F29 Unspecified psychosis not due to a substance or known physiological condition: Secondary | ICD-10-CM

## 2015-05-07 DIAGNOSIS — E1169 Type 2 diabetes mellitus with other specified complication: Secondary | ICD-10-CM

## 2015-05-07 DIAGNOSIS — M159 Polyosteoarthritis, unspecified: Secondary | ICD-10-CM

## 2015-05-07 DIAGNOSIS — G308 Other Alzheimer's disease: Secondary | ICD-10-CM | POA: Diagnosis not present

## 2015-05-07 DIAGNOSIS — I10 Essential (primary) hypertension: Secondary | ICD-10-CM

## 2015-05-07 NOTE — Progress Notes (Signed)
Patient ID: Ana Newman, female   DOB: 15-Mar-1935, 80 y.o.   MRN: [id number]   Facility:  [facility]       No Known Allergies  Chief Complaint  Patient presents with  . Annual Exam    HPI:  She is a long term resident of this facility being seen for her annual exam. Overall there has been little change in her status. She has not been hospitalized over the past year. Her weight remains stable at 163 pounds. She does spend nearly all of her time in bed per her choice. There are no nursing concerns at this time.    Past Medical History  Diagnosis Date  . Reflux esophagitis   . Unspecified constipation   . Essential hypertension, benign   . Alzheimer's disease   . Type II or unspecified type diabetes mellitus without mention of complication, uncontrolled   . Rickets, active   . Unspecified psychosis   . Unspecified vitamin D deficiency   . Osteoarthritis     Past Surgical History  Procedure Laterality Date  . Abdominal hysterectomy    . Joint replacement Left    No family history on file.  Social History   Social History  . Marital Status: Married    Spouse Name: N/A  . Number of Children: N/A  . Years of Education: N/A   Occupational History  . Not on file.   Social History Main Topics  . Smoking status: Never Smoker   . Smokeless tobacco: Not on file  . Alcohol Use: Not on file  . Drug Use: Not on file  . Sexual Activity: Not on file   Other Topics Concern  . Not on file   Social History Narrative      VITAL SIGNS BP 109/64 mmHg  Pulse 68  Temp(Src) 98.2 F (36.8 C)  Ht 4\' 11"  (1.499 m)  Wt 163 lb (73.936 kg)  BMI 32.90 kg/m2  SpO2 96%  Patient's Medications  New Prescriptions   No medications on file  Previous Medications   ACETAMINOPHEN (TYLENOL) 325 MG TABLET    Take 650 mg by mouth every 6 (six) hours as needed.   ACETAMINOPHEN (TYLENOL) 500 MG TABLET    Take 1,000 mg by mouth daily.   ASPIRIN 325 MG TABLET    Take 325 mg by mouth  daily. Take 1 tablet by mouth once daily.   ATORVASTATIN (LIPITOR) 10 MG TABLET    Take 10 mg by mouth daily at 6 PM.   BISACODYL (DULCOLAX) 5 MG EC TABLET    Take 10 mg by mouth daily as needed for moderate constipation.   CHOLECALCIFEROL (VITAMIN D) 1000 UNITS TABLET    Take 2,000 Units by mouth daily.   DOCUSATE SODIUM (COLACE) 100 MG CAPSULE    Take 100 mg by mouth daily as needed.    INSULIN GLARGINE (LANTUS) 100 UNIT/ML INJECTION    Inject 20-25 Units into the skin 2 (two) times daily. Take 25 units in the AM and 20 units in the PM   LISINOPRIL (PRINIVIL,ZESTRIL) 2.5 MG TABLET    Take 2 tablets (5 mg total) by mouth daily.   LORATADINE (CLARITIN) 10 MG TABLET    Take 10 mg by mouth daily as needed.    METOPROLOL TARTRATE (LOPRESSOR) 25 MG TABLET    Take 25 mg by mouth 2 (two) times daily. For HTN   POLYETHYLENE GLYCOL (MIRALAX / GLYCOLAX) PACKET    Take 17 g by mouth daily. Give one  17 gram by mouth once daily for constipation.   POLYVINYL ALCOHOL-POVIDONE (FRESHKOTE) 2.7-2 % SOLN    Apply 1 drop to eye 3 (three) times daily. To both eyes   SENNA-DOCUSATE (SENOKOT S) 8.6-50 MG PER TABLET    Take 1 tablet by mouth daily. Take 2 tablets by mouth daily for constipation.  Modified Medications   No medications on file  Discontinued Medications   No medications on file     SIGNIFICANT DIAGNOSTIC EXAMS   LABS REVIEWED:   05-31-14: hgb a1c 7.2  08-09-14: glucose 128; bun 13.9; creat 0.58; k+ 5.0; na++136; liver normal albumin 3.2; tsh 3.36;   Chol 205; ldl 108; trig 309; hdl 35  11-26-14: wbc 8.0; hgb 12.1; hct 39.0; mcv 95.7; plt 339; hgb a1c 7.1  12-11-14: vit d 37.32  02-05-15: chol 156; ldl 71; trig 207; hdl 44; tsh 5.01 03-06-15: wbc 8.9;  hgb 13.2; hct 41.1; mcv 93.8; plt 274; glucose 142; bun 15.2; creat 0.72; k+ 4.4; na+=132; liver normal albumin 3.6; hgb a1c 7.3      Review of Systems Constitutional: Negative for appetite change and fatigue.  Respiratory: Negative for cough, chest  tightness and shortness of breath.   Cardiovascular: Negative for chest pain, palpitations and leg swelling.  Gastrointestinal: Negative for nausea, abdominal pain, diarrhea and constipation.  Musculoskeletal: Negative for myalgias and arthralgias.  Skin: Negative for pallor.  Psychiatric/Behavioral: The patient is not nervous/anxious.       Physical Exam Constitutional: No distress.  Eyes: Conjunctivae are normal.  Neck: Neck supple. No JVD present. No thyromegaly present.  Cardiovascular: Normal rate, regular rhythm and intact distal pulses.   Respiratory: Effort normal and breath sounds normal. No respiratory distress. She has no wheezes.  GI: Soft. Bowel sounds are normal. She exhibits no distension. There is no tenderness.  Musculoskeletal: She exhibits no edema.  Able to move all extremities   Lymphadenopathy:    She has no cervical adenopathy.  Neurological: She is alert.  Skin: Skin is warm and dry. She is not diaphoretic.  Psychiatric: She has a normal mood and affect.     ASSESSMENT/ PLAN:  1. Hypertension: will continue lisinopril  2.5 mg daily; and lopressor 25 mg twice daily and asa 325 mg daily   2. Diabetes: will continue lantus 25 units in the AM and  20 units in the PM; hgb a1c is 7.3  3. Depression: will continue colace daily prn;  miralax daily and senna s 2 tabs daily   4. Dyslipidemia: will continue lipitor 10 mg daily ldl is 71; will monitor  5. Osteoarthritis: pain is presently being managed; will continue tylenol 1 gm daily and 650 mg every 6 hours as needed  6. Depression with psychosis: is presently stable is presently not taking medications; will monitor   7. Alzheimer's disease: no change in status; is presently not taking medications will not make changes will monitor her status.  Her current weight is 163 pounds.    Will place her on the foot dr list; otherwise her health maintenance is up to date   Time spent with patient  45  minutes  >50% time spent counseling; reviewing medical record; tests; labs; and developing future plan of care    Ok Edwards NP [facility]  Contact [phone] Monday through Friday 8am- 5pm  After hours call [phone]

## 2015-05-13 DIAGNOSIS — R05 Cough: Secondary | ICD-10-CM | POA: Diagnosis not present

## 2015-05-14 DIAGNOSIS — R509 Fever, unspecified: Secondary | ICD-10-CM | POA: Diagnosis not present

## 2015-05-14 LAB — CBC AND DIFFERENTIAL
HEMATOCRIT: 33 % — AB (ref 36–46)
HEMOGLOBIN: 11.1 g/dL — AB (ref 12.0–16.0)
PLATELETS: 243 10*3/uL (ref 150–399)
WBC: 15.6 10^3/mL

## 2015-05-15 ENCOUNTER — Telehealth: Payer: Self-pay

## 2015-05-15 ENCOUNTER — Non-Acute Institutional Stay (SKILLED_NURSING_FACILITY): Payer: Medicare Other | Admitting: Internal Medicine

## 2015-05-15 DIAGNOSIS — R319 Hematuria, unspecified: Secondary | ICD-10-CM | POA: Diagnosis not present

## 2015-05-15 DIAGNOSIS — N390 Urinary tract infection, site not specified: Secondary | ICD-10-CM | POA: Diagnosis not present

## 2015-05-15 DIAGNOSIS — N179 Acute kidney failure, unspecified: Secondary | ICD-10-CM | POA: Diagnosis not present

## 2015-05-15 NOTE — Telephone Encounter (Signed)
Prior authorization was received for promethazine. Patient is a resident at [facility], so forms were faxed to [facility] for completion,

## 2015-05-16 ENCOUNTER — Inpatient Hospital Stay (HOSPITAL_COMMUNITY)
Admission: EM | Admit: 2015-05-16 | Discharge: 2015-05-21 | DRG: 872 | Disposition: A | Discharge status: Skilled Nursing Facility | Payer: Medicare Other | Source: Skilled Nursing Facility | Attending: Internal Medicine | Admitting: Internal Medicine

## 2015-05-16 ENCOUNTER — Emergency Department (HOSPITAL_COMMUNITY): Payer: Medicare Other

## 2015-05-16 ENCOUNTER — Encounter (HOSPITAL_COMMUNITY): Payer: Self-pay

## 2015-05-16 DIAGNOSIS — R188 Other ascites: Secondary | ICD-10-CM | POA: Diagnosis present

## 2015-05-16 DIAGNOSIS — N136 Pyonephrosis: Secondary | ICD-10-CM | POA: Diagnosis not present

## 2015-05-16 DIAGNOSIS — Z87440 Personal history of urinary (tract) infections: Secondary | ICD-10-CM

## 2015-05-16 DIAGNOSIS — R1030 Lower abdominal pain, unspecified: Secondary | ICD-10-CM | POA: Diagnosis not present

## 2015-05-16 DIAGNOSIS — K5909 Other constipation: Secondary | ICD-10-CM | POA: Diagnosis present

## 2015-05-16 DIAGNOSIS — E11649 Type 2 diabetes mellitus with hypoglycemia without coma: Secondary | ICD-10-CM | POA: Diagnosis not present

## 2015-05-16 DIAGNOSIS — K7460 Unspecified cirrhosis of liver: Secondary | ICD-10-CM | POA: Diagnosis present

## 2015-05-16 DIAGNOSIS — N179 Acute kidney failure, unspecified: Secondary | ICD-10-CM | POA: Diagnosis not present

## 2015-05-16 DIAGNOSIS — F329 Major depressive disorder, single episode, unspecified: Secondary | ICD-10-CM | POA: Diagnosis present

## 2015-05-16 DIAGNOSIS — N390 Urinary tract infection, site not specified: Secondary | ICD-10-CM | POA: Diagnosis present

## 2015-05-16 DIAGNOSIS — Z794 Long term (current) use of insulin: Secondary | ICD-10-CM

## 2015-05-16 DIAGNOSIS — I10 Essential (primary) hypertension: Secondary | ICD-10-CM | POA: Diagnosis present

## 2015-05-16 DIAGNOSIS — A419 Sepsis, unspecified organism: Secondary | ICD-10-CM | POA: Diagnosis not present

## 2015-05-16 DIAGNOSIS — F0280 Dementia in other diseases classified elsewhere without behavioral disturbance: Secondary | ICD-10-CM | POA: Diagnosis present

## 2015-05-16 DIAGNOSIS — N200 Calculus of kidney: Secondary | ICD-10-CM

## 2015-05-16 DIAGNOSIS — R509 Fever, unspecified: Secondary | ICD-10-CM | POA: Diagnosis not present

## 2015-05-16 DIAGNOSIS — Z79899 Other long term (current) drug therapy: Secondary | ICD-10-CM

## 2015-05-16 DIAGNOSIS — N202 Calculus of kidney with calculus of ureter: Secondary | ICD-10-CM | POA: Diagnosis present

## 2015-05-16 DIAGNOSIS — Z7982 Long term (current) use of aspirin: Secondary | ICD-10-CM

## 2015-05-16 DIAGNOSIS — J9811 Atelectasis: Secondary | ICD-10-CM | POA: Diagnosis present

## 2015-05-16 DIAGNOSIS — F32A Depression, unspecified: Secondary | ICD-10-CM | POA: Diagnosis present

## 2015-05-16 DIAGNOSIS — F29 Unspecified psychosis not due to a substance or known physiological condition: Secondary | ICD-10-CM | POA: Diagnosis present

## 2015-05-16 DIAGNOSIS — E785 Hyperlipidemia, unspecified: Secondary | ICD-10-CM | POA: Diagnosis present

## 2015-05-16 DIAGNOSIS — E039 Hypothyroidism, unspecified: Secondary | ICD-10-CM | POA: Diagnosis present

## 2015-05-16 DIAGNOSIS — G309 Alzheimer's disease, unspecified: Secondary | ICD-10-CM | POA: Diagnosis present

## 2015-05-16 DIAGNOSIS — Z79811 Long term (current) use of aromatase inhibitors: Secondary | ICD-10-CM

## 2015-05-16 DIAGNOSIS — E119 Type 2 diabetes mellitus without complications: Secondary | ICD-10-CM

## 2015-05-16 DIAGNOSIS — R109 Unspecified abdominal pain: Secondary | ICD-10-CM

## 2015-05-16 LAB — URINE MICROSCOPIC-ADD ON

## 2015-05-16 LAB — COMPREHENSIVE METABOLIC PANEL
ALBUMIN: 2.6 g/dL — AB (ref 3.5–5.0)
ALT: 16 U/L (ref 14–54)
AST: 20 U/L (ref 15–41)
Alkaline Phosphatase: 74 U/L (ref 38–126)
Anion gap: 18 — ABNORMAL HIGH (ref 5–15)
BUN: 26 mg/dL — AB (ref 6–20)
CHLORIDE: 101 mmol/L (ref 101–111)
CO2: 16 mmol/L — AB (ref 22–32)
Calcium: 8.4 mg/dL — ABNORMAL LOW (ref 8.9–10.3)
Creatinine, Ser: 1.28 mg/dL — ABNORMAL HIGH (ref 0.44–1.00)
GFR calc Af Amer: 45 mL/min — ABNORMAL LOW (ref >60)
GFR calc non Af Amer: 39 mL/min — ABNORMAL LOW (ref >60)
GLUCOSE: 108 mg/dL — AB (ref 65–99)
Potassium: 3.7 mmol/L (ref 3.5–5.1)
SODIUM: 135 mmol/L (ref 135–145)
Total Bilirubin: 1.2 mg/dL (ref 0.3–1.2)
Total Protein: 6.9 g/dL (ref 6.5–8.1)

## 2015-05-16 LAB — URINALYSIS, ROUTINE W REFLEX MICROSCOPIC
BILIRUBIN URINE: NEGATIVE
GLUCOSE, UA: NEGATIVE mg/dL
KETONES UR: 40 mg/dL — AB
Nitrite: NEGATIVE
PH: 6 (ref 5.0–8.0)
PROTEIN: 30 mg/dL — AB
Specific Gravity, Urine: 1.022 (ref 1.005–1.030)

## 2015-05-16 LAB — CBC WITH DIFFERENTIAL/PLATELET
Basophils Absolute: 0 10*3/uL (ref 0.0–0.1)
Basophils Relative: 0 %
EOS PCT: 0 %
Eosinophils Absolute: 0 10*3/uL (ref 0.0–0.7)
HEMATOCRIT: 31.4 % — AB (ref 36.0–46.0)
Hemoglobin: 10.4 g/dL — ABNORMAL LOW (ref 12.0–15.0)
LYMPHS ABS: 1.6 10*3/uL (ref 0.7–4.0)
Lymphocytes Relative: 9 %
MCH: 30.9 pg (ref 26.0–34.0)
MCHC: 33.1 g/dL (ref 30.0–36.0)
MCV: 93.2 fL (ref 78.0–100.0)
MONOS PCT: 8 %
Monocytes Absolute: 1.4 10*3/uL — ABNORMAL HIGH (ref 0.1–1.0)
NEUTROS ABS: 15.1 10*3/uL — AB (ref 1.7–7.7)
Neutrophils Relative %: 83 %
Platelets: 293 10*3/uL (ref 150–400)
RBC: 3.37 MIL/uL — AB (ref 3.87–5.11)
RDW: 14.2 % (ref 11.5–15.5)
WBC: 18.1 10*3/uL — AB (ref 4.0–10.5)

## 2015-05-16 LAB — I-STAT CG4 LACTIC ACID, ED: LACTIC ACID, VENOUS: 0.73 mmol/L (ref 0.5–2.0)

## 2015-05-16 MED ORDER — SODIUM CHLORIDE 0.9 % IV BOLUS (SEPSIS)
1000.0000 mL | INTRAVENOUS | Status: AC
  Administered 2015-05-16 (×2): 1000 mL via INTRAVENOUS

## 2015-05-16 MED ORDER — DEXTROSE 5 % IV SOLN
2.0000 g | Freq: Once | INTRAVENOUS | Status: AC
  Administered 2015-05-16: 2 g via INTRAVENOUS
  Filled 2015-05-16: qty 2

## 2015-05-16 MED ORDER — SODIUM CHLORIDE 0.9 % IV BOLUS (SEPSIS)
500.0000 mL | INTRAVENOUS | Status: AC
  Administered 2015-05-16: 500 mL via INTRAVENOUS

## 2015-05-16 MED ORDER — DEXTROSE 5 % IV SOLN
2.0000 g | INTRAVENOUS | Status: DC
  Administered 2015-05-17 – 2015-05-19 (×2): 2 g via INTRAVENOUS
  Filled 2015-05-16 (×4): qty 2

## 2015-05-16 NOTE — Progress Notes (Signed)
Pharmacy Antibiotic Note  Ana Newman is a 80 y.o. female admitted on 05/16/2015 with UTI.  Per report, patient has received two days of Rocephin at [facility].  Pharmacy has been consulted for cefepime dosing.  Plan:  Cefepime 2g IV q24h  Monitor renal function and adjust if needed  Follow up cultures and narrow spectrum as appropriate  Height: 5' (152.4 cm) Weight: 163 lb (73.936 kg) IBW/kg (Calculated) : 45.5  Temp (24hrs), Avg:103.4 F (39.7 C), Min:103.4 F (39.7 C), Max:103.4 F (39.7 C)   Recent Labs Lab 05/16/15 1947  WBC 18.1*  CREATININE 1.28*    Estimated Creatinine Clearance: 32.5 mL/min (by C-G formula based on Cr of 1.28).    No Known Allergies  Antimicrobials this admission: 4/14 >> Cefepime >>  Dose adjustments this admission: n/a  Microbiology results: 4/14 BCx: sent 4/14 UCx: ordered   Thank you for allowing pharmacy to be a part of this patient's care.  Peggyann Juba, PharmD, BCPS Pager: [phone] 05/16/2015 8:30 PM

## 2015-05-16 NOTE — ED Notes (Signed)
Per EMS- patient is a resident of [facility]. Patient was recently diagnosed with a UTI and has had Rocephin x 2 days. Staff reported that the patient is not getting any better. EMS gave Tylenol 1000 mg po for a temp of 104.8.

## 2015-05-16 NOTE — ED Notes (Signed)
In and out performed. Small skin abrasion noted on rectal area while performing ADL

## 2015-05-16 NOTE — ED Notes (Signed)
In Ct with get EKG on re arrival

## 2015-05-16 NOTE — ED Provider Notes (Signed)
CSN: [id number]     Arrival date & time 05/16/15  1847 History   First MD Initiated Contact with Patient 05/16/15 1908     Chief Complaint  Patient presents with  . Code Sepsis     (Consider location/radiation/quality/duration/timing/severity/associated sxs/prior Treatment) HPI Comments: 80 year old female with history of Alzheimer's disease, diabetes mellitus presents for fever. The patient was diagnosed with UTI and has been receiving Rocephin at her assisted living facility over the last 2 days. She has not appeared to be getting any better and has remained febrile at times and so they called EMS to bring her back to the emergency department. The patient was febrile per EMS and was given Tylenol. The patient was also tachycardic en route to the hospital. Patient reports that she just is not feeling well at all. Denies any cough or shortness of breath.   Past Medical History  Diagnosis Date  . Reflux esophagitis   . Unspecified constipation   . Essential hypertension, benign   . Alzheimer's disease   . Type II or unspecified type diabetes mellitus without mention of complication, uncontrolled   . Rickets, active   . Unspecified psychosis   . Unspecified vitamin D deficiency   . Osteoarthritis    Past Surgical History  Procedure Laterality Date  . Abdominal hysterectomy    . Joint replacement Left    Family History  Problem Relation Age of Onset  . Family history unknown: Yes   Social History  Substance Use Topics  . Smoking status: Never Smoker   . Smokeless tobacco: Never Used  . Alcohol Use: No   OB History    No data available     Review of Systems  Constitutional: Positive for fever and fatigue.  HENT: Negative for congestion.   Eyes: Negative for discharge.  Respiratory: Negative for cough and shortness of breath.   Cardiovascular: Negative for chest pain and palpitations.  Gastrointestinal: Negative for nausea, vomiting, abdominal pain, diarrhea and  constipation.  Genitourinary: Negative for urgency, hematuria and flank pain.  Musculoskeletal: Negative for myalgias and back pain.  Skin: Negative for rash.  Neurological: Negative for headaches.      Allergies  Review of patient's allergies indicates no known allergies.  Home Medications   Prior to Admission medications   Medication Sig Start Date End Date Taking? Authorizing Provider  acetaminophen (TYLENOL) 500 MG tablet Take 1,000 mg by mouth daily.   Yes Historical Provider, MD  aspirin 325 MG tablet Take 325 mg by mouth daily. Take 1 tablet by mouth once daily.   Yes Historical Provider, MD  atorvastatin (LIPITOR) 10 MG tablet Take 10 mg by mouth daily at 6 PM.   Yes Historical Provider, MD  cefTRIAXone (ROCEPHIN) 1 g injection Inject 1 g into the muscle daily. For 7 Days. Start Date 05/15/15.   Yes Historical Provider, MD  cholecalciferol (VITAMIN D) 1000 UNITS tablet Take 2,000 Units by mouth daily.   Yes Historical Provider, MD  insulin glargine (LANTUS) 100 UNIT/ML injection Inject 20-25 Units into the skin 2 (two) times daily. Take 25 units in the AM and 20 units in the PM   Yes Historical Provider, MD  lisinopril (PRINIVIL,ZESTRIL) 5 MG tablet Take 2.5 mg by mouth daily.   Yes Historical Provider, MD  loratadine (CLARITIN) 10 MG tablet Take 10 mg by mouth daily as needed for allergies.    Yes Historical Provider, MD  metoprolol tartrate (LOPRESSOR) 25 MG tablet Take 25 mg by mouth 2 (two) times  daily. For HTN   Yes Historical Provider, MD  polyethylene glycol (MIRALAX / GLYCOLAX) packet Take 17 g by mouth daily. Give one 17 gram by mouth once daily for constipation.   Yes Historical Provider, MD  Polyvinyl Alcohol-Povidone (FRESHKOTE) 2.7-2 % SOLN Apply 1 drop to eye 3 (three) times daily. To both eyes   Yes Historical Provider, MD  senna-docusate (SENOKOT S) 8.6-50 MG per tablet Take 2 tablets by mouth at bedtime.    Yes Historical Provider, MD  acetaminophen (TYLENOL) 325  MG tablet Take 650 mg by mouth every 6 (six) hours as needed.    Historical Provider, MD  bisacodyl (DULCOLAX) 5 MG EC tablet Take 10 mg by mouth daily as needed for moderate constipation.    Historical Provider, MD  docusate sodium (COLACE) 100 MG capsule Take 100 mg by mouth daily as needed.     Historical Provider, MD  lisinopril (PRINIVIL,ZESTRIL) 2.5 MG tablet Take 2 tablets (5 mg total) by mouth daily. Patient not taking: Reported on 05/16/2015 03/04/14   Gerlene Fee, NP   BP 122/49 mmHg  Pulse 88  Temp(Src) 98.3 F (36.8 C) (Oral)  Resp 25  Ht 5' (1.524 m)  Wt 163 lb (73.936 kg)  BMI 31.83 kg/m2  SpO2 97% Physical Exam  Constitutional: No distress.  HENT:  Head: Normocephalic and atraumatic.  Right Ear: External ear normal.  Left Ear: External ear normal.  Nose: Nose normal.  Mouth/Throat: Oropharynx is clear and moist. No oropharyngeal exudate.  Eyes: EOM are normal. Pupils are equal, round, and reactive to light.  Neck: Normal range of motion. Neck supple.  Cardiovascular: Normal rate, regular rhythm and intact distal pulses.   Pulmonary/Chest: Effort normal. No respiratory distress. She has no wheezes. She has no rales.  Abdominal: Soft. She exhibits no distension. There is no tenderness.  Musculoskeletal: Normal range of motion. She exhibits no edema or tenderness.  Neurological: She is alert. She exhibits normal muscle tone.  Skin: Skin is warm and dry. No rash noted. She is not diaphoretic.  Vitals reviewed.   ED Course  Procedures (including critical care time) Labs Review Labs Reviewed  COMPREHENSIVE METABOLIC PANEL - Abnormal; Notable for the following:    CO2 16 (*)    Glucose, Bld 108 (*)    BUN 26 (*)    Creatinine, Ser 1.28 (*)    Calcium 8.4 (*)    Albumin 2.6 (*)    GFR calc non Af Amer 39 (*)    GFR calc Af Amer 45 (*)    Anion gap 18 (*)    All other components within normal limits  CBC WITH DIFFERENTIAL/PLATELET - Abnormal; Notable for the  following:    WBC 18.1 (*)    RBC 3.37 (*)    Hemoglobin 10.4 (*)    HCT 31.4 (*)    Neutro Abs 15.1 (*)    Monocytes Absolute 1.4 (*)    All other components within normal limits  URINALYSIS, ROUTINE W REFLEX MICROSCOPIC (NOT AT [facility]) - Abnormal; Notable for the following:    APPearance CLOUDY (*)    Hgb urine dipstick MODERATE (*)    Ketones, ur 40 (*)    Protein, ur 30 (*)    Leukocytes, UA MODERATE (*)    All other components within normal limits  URINE MICROSCOPIC-ADD ON - Abnormal; Notable for the following:    Squamous Epithelial / LPF 0-5 (*)    Bacteria, UA FEW (*)    All other  components within normal limits  CULTURE, BLOOD (ROUTINE X 2)  CULTURE, BLOOD (ROUTINE X 2)  URINE CULTURE  I-STAT CG4 LACTIC ACID, ED  I-STAT CG4 LACTIC ACID, ED    Imaging Review Dg Chest 2 View  05/16/2015  CLINICAL DATA:  Fever.  Sepsis. EXAM: CHEST  2 VIEW COMPARISON:  02/02/2005. FINDINGS: Normal sized heart. Clear lungs. Mild diffuse peribronchial thickening. Unremarkable bones. IMPRESSION: Stable mild chronic bronchitic changes.  No acute abnormality. Electronically Signed   By: Claudie Revering M.D.   On: 05/16/2015 20:22   I have personally reviewed and evaluated these images and lab results as part of my medical decision-making.   EKG Interpretation   Date/Time:  Friday May 16 2015 20:37:38 EDT Ventricular Rate:  101 PR Interval:  132 QRS Duration: 86 QT Interval:  345 QTC Calculation: 447 R Axis:   17 Text Interpretation:  Sinus tachycardia Inferior infarct, old Rate  increased compared to previous tracing Confirmed by Lonia Skinner ([id number])  on 05/16/2015 9:42:37 PM      MDM  Patient was seen and evaluated in stable condition. Patient was febrile and tachycardic on arrival. Could sepsis was initiated. Patient was hydrated with 2500 mL of IV fluids. She was given Tylenol. Lab results revealed an elevated white blood cell count. UA concerning for possible infection versus  partially treated infection. Cultures pending. Patient was initiated on cefepime after initial evaluation given history of UTI and her vitals. Heart rate normalized and patient felt significantly better on reevaluation. There was a delay in obtaining her lactic acid and this was not drawn until after she had received her IV fluids.  Case was discussed with Dr. Myna Hidalgo who agreed with admission. Final diagnoses:  None    1. Sepsis 2. UTI    Harvel Quale, MD 05/17/15 [id number]

## 2015-05-16 NOTE — Clinical Social Work Note (Signed)
Clinical Social Work Assessment  Patient Details  Name: JONELLE BANN MRN: [id number] Date of Birth: [birth year]  Date of referral:  05/16/15               Reason for consult:   (Patient is from facility/[facility].)                Permission sought to share information with:   (None.) Permission granted to share information::  No  Name::        Agency::     Relationship::     Contact Information:     Housing/Transportation Living arrangements for the past 2 months:  [place] ([place]) Source of Information:  Patient Patient Interpreter Needed:  None Criminal Activity/Legal Involvement Pertinent to Current Situation/Hospitalization:  No - Comment as needed Significant Relationships:  Adult Children Lives with:  Facility Resident Do you feel safe going back to the place where you live?  Yes Need for family participation in patient care:  Yes (Comment) (Patient states son is supportive.)  Care giving concerns:  There are no care giving concerns at this time. Patient confirms that she is from [facility]. She states staff assist her with completing ADL's.    Social Worker assessment / plan:  CSW met with patient at bedside. Patient was alert and oriented. There was no family present. Patient confirms that she presents to [facility] due to UTI. Also, she confirms that she is from [facility]. Patient states that she has been a resident at the facility for 9 years.  Patient states that she is bed bound. Patient states that her son is her primary support.   Employment status:  Retired Forensic scientist:  Medicare PT Recommendations:  Not assessed at this time Information / Referral to community resources:   (Patient is from facility.)  Patient/Family's Response to care:  Patient is aware that she will be admitted, and she is accepting at this time.   Patient/Family's Understanding of and Emotional Response to Diagnosis, Current Treatment, and Prognosis:   Patient has no questions for CSW.  Emotional Assessment Appearance:  Appears stated age Attitude/Demeanor/Rapport:   (Appropriate.) Affect (typically observed):  Accepting Orientation:  Oriented to Self, Oriented to Place, Oriented to  Time, Oriented to Situation Alcohol / Substance use:  Not Applicable Psych involvement (Current and /or in the community):  No (Comment)  Discharge Needs  Concerns to be addressed:  Adjustment to Illness Readmission within the last 30 days:  No Current discharge risk:  None Barriers to Discharge:  No Barriers Identified   Bernita Buffy, LCSW 05/16/2015, 10:28 PM

## 2015-05-17 ENCOUNTER — Encounter (HOSPITAL_COMMUNITY): Payer: Self-pay | Admitting: Family Medicine

## 2015-05-17 DIAGNOSIS — N390 Urinary tract infection, site not specified: Secondary | ICD-10-CM | POA: Diagnosis not present

## 2015-05-17 DIAGNOSIS — J9811 Atelectasis: Secondary | ICD-10-CM | POA: Diagnosis present

## 2015-05-17 DIAGNOSIS — N200 Calculus of kidney: Secondary | ICD-10-CM | POA: Diagnosis not present

## 2015-05-17 DIAGNOSIS — F23 Brief psychotic disorder: Secondary | ICD-10-CM | POA: Diagnosis not present

## 2015-05-17 DIAGNOSIS — E039 Hypothyroidism, unspecified: Secondary | ICD-10-CM | POA: Diagnosis present

## 2015-05-17 DIAGNOSIS — A419 Sepsis, unspecified organism: Secondary | ICD-10-CM | POA: Diagnosis present

## 2015-05-17 DIAGNOSIS — N179 Acute kidney failure, unspecified: Secondary | ICD-10-CM | POA: Diagnosis present

## 2015-05-17 DIAGNOSIS — R188 Other ascites: Secondary | ICD-10-CM | POA: Diagnosis present

## 2015-05-17 DIAGNOSIS — F39 Unspecified mood [affective] disorder: Secondary | ICD-10-CM | POA: Diagnosis not present

## 2015-05-17 DIAGNOSIS — Z7982 Long term (current) use of aspirin: Secondary | ICD-10-CM | POA: Diagnosis not present

## 2015-05-17 DIAGNOSIS — F29 Unspecified psychosis not due to a substance or known physiological condition: Secondary | ICD-10-CM | POA: Diagnosis present

## 2015-05-17 DIAGNOSIS — R1030 Lower abdominal pain, unspecified: Secondary | ICD-10-CM | POA: Diagnosis not present

## 2015-05-17 DIAGNOSIS — N132 Hydronephrosis with renal and ureteral calculous obstruction: Secondary | ICD-10-CM | POA: Diagnosis not present

## 2015-05-17 DIAGNOSIS — E1165 Type 2 diabetes mellitus with hyperglycemia: Secondary | ICD-10-CM | POA: Diagnosis not present

## 2015-05-17 DIAGNOSIS — N201 Calculus of ureter: Secondary | ICD-10-CM | POA: Diagnosis not present

## 2015-05-17 DIAGNOSIS — Z79811 Long term (current) use of aromatase inhibitors: Secondary | ICD-10-CM | POA: Diagnosis not present

## 2015-05-17 DIAGNOSIS — N202 Calculus of kidney with calculus of ureter: Secondary | ICD-10-CM | POA: Diagnosis present

## 2015-05-17 DIAGNOSIS — N10 Acute tubulo-interstitial nephritis: Secondary | ICD-10-CM | POA: Diagnosis not present

## 2015-05-17 DIAGNOSIS — E785 Hyperlipidemia, unspecified: Secondary | ICD-10-CM | POA: Diagnosis present

## 2015-05-17 DIAGNOSIS — F0280 Dementia in other diseases classified elsewhere without behavioral disturbance: Secondary | ICD-10-CM | POA: Diagnosis present

## 2015-05-17 DIAGNOSIS — Z79899 Other long term (current) drug therapy: Secondary | ICD-10-CM | POA: Diagnosis not present

## 2015-05-17 DIAGNOSIS — G309 Alzheimer's disease, unspecified: Secondary | ICD-10-CM | POA: Diagnosis present

## 2015-05-17 DIAGNOSIS — N136 Pyonephrosis: Secondary | ICD-10-CM | POA: Diagnosis present

## 2015-05-17 DIAGNOSIS — I10 Essential (primary) hypertension: Secondary | ICD-10-CM | POA: Diagnosis not present

## 2015-05-17 DIAGNOSIS — E11649 Type 2 diabetes mellitus with hypoglycemia without coma: Secondary | ICD-10-CM | POA: Diagnosis present

## 2015-05-17 DIAGNOSIS — F329 Major depressive disorder, single episode, unspecified: Secondary | ICD-10-CM | POA: Diagnosis present

## 2015-05-17 DIAGNOSIS — E119 Type 2 diabetes mellitus without complications: Secondary | ICD-10-CM | POA: Diagnosis not present

## 2015-05-17 DIAGNOSIS — K7460 Unspecified cirrhosis of liver: Secondary | ICD-10-CM | POA: Diagnosis present

## 2015-05-17 DIAGNOSIS — K5909 Other constipation: Secondary | ICD-10-CM | POA: Diagnosis present

## 2015-05-17 DIAGNOSIS — Z794 Long term (current) use of insulin: Secondary | ICD-10-CM | POA: Diagnosis not present

## 2015-05-17 DIAGNOSIS — F0390 Unspecified dementia without behavioral disturbance: Secondary | ICD-10-CM | POA: Diagnosis not present

## 2015-05-17 DIAGNOSIS — Z87440 Personal history of urinary (tract) infections: Secondary | ICD-10-CM | POA: Diagnosis not present

## 2015-05-17 DIAGNOSIS — K219 Gastro-esophageal reflux disease without esophagitis: Secondary | ICD-10-CM | POA: Diagnosis not present

## 2015-05-17 LAB — BASIC METABOLIC PANEL
ANION GAP: 13 (ref 5–15)
BUN: 23 mg/dL — ABNORMAL HIGH (ref 6–20)
CALCIUM: 8.1 mg/dL — AB (ref 8.9–10.3)
CO2: 20 mmol/L — ABNORMAL LOW (ref 22–32)
Chloride: 107 mmol/L (ref 101–111)
Creatinine, Ser: 1.15 mg/dL — ABNORMAL HIGH (ref 0.44–1.00)
GFR, EST AFRICAN AMERICAN: 51 mL/min — AB (ref >60)
GFR, EST NON AFRICAN AMERICAN: 44 mL/min — AB (ref >60)
GLUCOSE: 83 mg/dL (ref 65–99)
Potassium: 3.8 mmol/L (ref 3.5–5.1)
SODIUM: 140 mmol/L (ref 135–145)

## 2015-05-17 LAB — PROTIME-INR
INR: 1.16 (ref 0.00–1.49)
PROTHROMBIN TIME: 15 s (ref 11.6–15.2)

## 2015-05-17 LAB — GLUCOSE, CAPILLARY
GLUCOSE-CAPILLARY: 158 mg/dL — AB (ref 65–99)
GLUCOSE-CAPILLARY: 147 mg/dL — AB (ref 65–99)
GLUCOSE-CAPILLARY: 139 mg/dL — AB (ref 65–99)
Glucose-Capillary: 172 mg/dL — ABNORMAL HIGH (ref 65–99)

## 2015-05-17 LAB — APTT: aPTT: 29 seconds (ref 24–37)

## 2015-05-17 LAB — PROCALCITONIN: Procalcitonin: 22.34 ng/mL

## 2015-05-17 LAB — MRSA PCR SCREENING: MRSA BY PCR: NEGATIVE

## 2015-05-17 MED ORDER — INSULIN ASPART 100 UNIT/ML ~~LOC~~ SOLN
0.0000 [IU] | Freq: Three times a day (TID) | SUBCUTANEOUS | Status: DC
  Administered 2015-05-17: 1 [IU] via SUBCUTANEOUS
  Administered 2015-05-17: 2 [IU] via SUBCUTANEOUS
  Administered 2015-05-17: 1 [IU] via SUBCUTANEOUS
  Administered 2015-05-18 – 2015-05-19 (×2): 2 [IU] via SUBCUTANEOUS
  Administered 2015-05-20 – 2015-05-21 (×3): 1 [IU] via SUBCUTANEOUS

## 2015-05-17 MED ORDER — ASPIRIN 325 MG PO TABS
325.0000 mg | ORAL_TABLET | Freq: Every day | ORAL | Status: DC
  Administered 2015-05-17 – 2015-05-21 (×5): 325 mg via ORAL
  Filled 2015-05-17 (×5): qty 1

## 2015-05-17 MED ORDER — ACETAMINOPHEN 325 MG PO TABS
650.0000 mg | ORAL_TABLET | Freq: Four times a day (QID) | ORAL | Status: DC | PRN
  Administered 2015-05-17 (×2): 650 mg via ORAL
  Filled 2015-05-17 (×2): qty 2

## 2015-05-17 MED ORDER — ENOXAPARIN SODIUM 40 MG/0.4ML ~~LOC~~ SOLN
40.0000 mg | SUBCUTANEOUS | Status: DC
  Administered 2015-05-17 – 2015-05-21 (×5): 40 mg via SUBCUTANEOUS
  Filled 2015-05-17 (×5): qty 0.4

## 2015-05-17 MED ORDER — LORATADINE 10 MG PO TABS: 10.0000 mg | ORAL_TABLET | Freq: Every day | ORAL | Status: DC | PRN

## 2015-05-17 MED ORDER — HYDRALAZINE HCL 20 MG/ML IJ SOLN: 10.0000 mg | INTRAMUSCULAR | Status: DC | PRN

## 2015-05-17 MED ORDER — ONDANSETRON HCL 4 MG PO TABS: 4.0000 mg | ORAL_TABLET | Freq: Four times a day (QID) | ORAL | Status: DC | PRN

## 2015-05-17 MED ORDER — ONDANSETRON HCL 4 MG/2ML IJ SOLN
4.0000 mg | Freq: Four times a day (QID) | INTRAMUSCULAR | Status: DC | PRN
  Administered 2015-05-17 – 2015-05-18 (×3): 4 mg via INTRAVENOUS
  Filled 2015-05-17 (×3): qty 2

## 2015-05-17 MED ORDER — POLYVINYL ALCOHOL 1.4 % OP SOLN
1.0000 [drp] | Freq: Three times a day (TID) | OPHTHALMIC | Status: DC
  Administered 2015-05-17 – 2015-05-21 (×12): 1 [drp] via OPHTHALMIC
  Filled 2015-05-17: qty 15

## 2015-05-17 MED ORDER — ACETAMINOPHEN 500 MG PO TABS
1000.0000 mg | ORAL_TABLET | Freq: Every day | ORAL | Status: DC
  Administered 2015-05-17 – 2015-05-21 (×5): 1000 mg via ORAL
  Filled 2015-05-17 (×5): qty 2

## 2015-05-17 MED ORDER — VITAMIN D 1000 UNITS PO TABS
2000.0000 [IU] | ORAL_TABLET | Freq: Every day | ORAL | Status: DC
Start: 2015-05-17 — End: 2015-05-21
  Administered 2015-05-17 – 2015-05-21 (×5): 2000 [IU] via ORAL
  Filled 2015-05-17 (×5): qty 2

## 2015-05-17 MED ORDER — ATORVASTATIN CALCIUM 10 MG PO TABS
10.0000 mg | ORAL_TABLET | Freq: Every day | ORAL | Status: DC
  Administered 2015-05-17 – 2015-05-20 (×3): 10 mg via ORAL
  Filled 2015-05-17 (×4): qty 1

## 2015-05-17 MED ORDER — SODIUM CHLORIDE 0.9% FLUSH
3.0000 mL | Freq: Two times a day (BID) | INTRAVENOUS | Status: DC
  Administered 2015-05-17 – 2015-05-21 (×5): 3 mL via INTRAVENOUS

## 2015-05-17 MED ORDER — INSULIN GLARGINE 100 UNIT/ML ~~LOC~~ SOLN
17.0000 [IU] | Freq: Two times a day (BID) | SUBCUTANEOUS | Status: DC
  Administered 2015-05-17 – 2015-05-20 (×6): 17 [IU] via SUBCUTANEOUS
  Filled 2015-05-17 (×8): qty 0.17

## 2015-05-17 NOTE — NC FL2 (Signed)
[place] LEVEL OF CARE SCREENING TOOL     IDENTIFICATION  Patient Name: Ana Newman Birthdate: Sep 18, 1935 Sex: female Admission Date (Current Location): 05/16/2015  [facility] and [place] Number:  Herbalist and Address:  [facility],  [place] [address] [place]      Provider Number: [id number]  Attending Physician Name and Address:  Kelvin Cellar, MD  Relative Name and Phone Number:       Current Level of Care: Hospital Recommended Level of Care: [place] Prior Approval Number:    Date Approved/Denied:   PASRR Number:    Discharge Plan: SNF    Current Diagnoses: Patient Active Problem List   Diagnosis Date Noted  . Sepsis ([place]) 05/17/2015  . AKI (acute kidney injury) ([place]) 05/17/2015  . UTI (lower urinary tract infection) 05/17/2015  . Environmental and seasonal allergies 04/06/2015  . Dyslipidemia associated with type 2 diabetes mellitus ([place]) 02/04/2015  . Diabetes mellitus, type II, insulin dependent ([place]) 02/04/2015  . Alzheimer's disease 07/10/2014  . Depression 01/18/2014  . Insomnia 01/18/2014  . Hypothyroidism 01/18/2014  . Essential hypertension, benign   . Psychosis   . Vitamin D deficiency   . Osteoarthritis     Orientation RESPIRATION BLADDER Height & Weight     Self, Time  Normal Incontinent Weight: 162 lb 14.7 oz (73.9 kg) Height:  5' (152.4 cm)  BEHAVIORAL SYMPTOMS/MOOD NEUROLOGICAL BOWEL NUTRITION STATUS      Continent Diet (Carb Modified)  AMBULATORY STATUS COMMUNICATION OF NEEDS Skin   Extensive Assist Verbally Surgical wounds (Wound/Incision(OpenorDehisced)04/15/17Other(Comment)Buttocks)                       Personal Care Assistance Level of Assistance  Bathing, Dressing Bathing Assistance: Maximum assistance   Dressing Assistance: Maximum assistance     Functional Limitations Info             SPECIAL CARE FACTORS FREQUENCY                        Contractures      Additional Factors Info  Code Status, Allergies Code Status Info: Fullcode Allergies Info: NKDA           Current Medications (05/17/2015):  This is the current hospital active medication list Current Facility-Administered Medications  Medication Dose Route Frequency Provider Last Rate Last Dose  . acetaminophen (TYLENOL) tablet 1,000 mg  1,000 mg Oral Daily Vianne Bulls, MD      . acetaminophen (TYLENOL) tablet 650 mg  650 mg Oral Q6H PRN Vianne Bulls, MD   650 mg at 05/17/15 [id number]  . aspirin tablet 325 mg  325 mg Oral Daily Ilene Qua Opyd, MD      . atorvastatin (LIPITOR) tablet 10 mg  10 mg Oral q1800 Timothy S Opyd, MD      . ceFEPIme (MAXIPIME) 2 g in dextrose 5 % 50 mL IVPB  2 g Intravenous Q24H Emiliano Dyer, RPH      . cholecalciferol (VITAMIN D) tablet 2,000 Units  2,000 Units Oral Daily Ilene Qua Opyd, MD      . enoxaparin (LOVENOX) injection 40 mg  40 mg Subcutaneous Q24H Ilene Qua Opyd, MD      . hydrALAZINE (APRESOLINE) injection 10 mg  10 mg Intravenous Q4H PRN Ilene Qua Opyd, MD      . insulin aspart (novoLOG) injection 0-9 Units  0-9 Units Subcutaneous TID WC  Vianne Bulls, MD   2 Units at 05/17/15 0813  . insulin glargine (LANTUS) injection 17 Units  17 Units Subcutaneous BID Vianne Bulls, MD      . loratadine (CLARITIN) tablet 10 mg  10 mg Oral Daily PRN Vianne Bulls, MD      . ondansetron (ZOFRAN) tablet 4 mg  4 mg Oral Q6H PRN Vianne Bulls, MD       Or  . ondansetron (ZOFRAN) injection 4 mg  4 mg Intravenous Q6H PRN Vianne Bulls, MD      . polyvinyl alcohol (LIQUIFILM TEARS) 1.4 % ophthalmic solution 1 drop  1 drop Both Eyes TID Ilene Qua Opyd, MD      . sodium chloride flush (NS) 0.9 % injection 3 mL  3 mL Intravenous Q12H Vianne Bulls, MD         Discharge Medications: Please see discharge summary for a list of discharge medications.  Relevant Imaging Results:  Relevant Lab Results:   Additional  Information SSN: [id number]  Standley Brooking, LCSW

## 2015-05-17 NOTE — Progress Notes (Signed)
[facility] PROGRESS NOTE  Ana NEUMAIER [id number] DOB: 1935/09/10 DOA: 05/16/2015 PCP: Hennie Duos, MD  Assessment/Plan: 1. Sepsis -Present on admission, evidenced by temperature of 103.4, heart rate of 116, respiratory rate of 31, white count of 18,100 -Suspect source of infection to be urinary tract infection and possible C. difficile colitis given diarrhea and recent antimicrobial use. -She was started on cefepime IV -Continue IV fluids, supportive care, C. difficile pending, blood cultures and urine cultures pending  2.  Diarrhea. -She was recently diagnosed with urinary tract infection at her facility and was treated with ceftriaxone IM. -He comes in having multiple episodes of diarrhea -Stool for C. difficile is currently pending.   3.  Acute kidney injury -Labs showing creatinine of 1.28 with BUN of 26, previously 0.8 and 20 on 02/27/2014  4.  Insulin dependent diabetes mellitus -Blood are stable, continue Lantus 17 units subcutaneous twice a day  Code Status: full code Family Communication: family not present Disposition Plan: continue IV antibiotic therapy  Antibiotics:  Cefepime  HPI/Subjective: Ana Newman is a 80 year old female with past medical history of common of impairment, diabetes mellitus, currently resident at a skilled nursing facility, recently diagnosed with urinary tract infection and started on IM Rocephin. Slight this intervention she continued to have fevers. As on presentation showed a white count of 18,100 with a creatinine 1.28 and BUN of 26. She was started on broad-spectrum IV antimicrobial therapy with cefepime.  Objective: Filed Vitals:   05/17/15 0815 05/17/15 1426  BP:  154/60  Pulse:  101  Temp: 99.7 F (37.6 C) 98.6 F (37 C)  Resp:  20   No intake or output data in the 24 hours ending 05/17/15 1551 Filed Weights   05/16/15 1940 05/17/15 0148  Weight: 73.936 kg (163 lb) 73.9 kg (162 lb 14.7 oz)     Exam:   General:  Stable, no acute distress, mildly confused  Cardiovascular: tachycardic, regular rate rhythm normal S1-S2 no murmurs or gallops  Respiratory: she is having normal respiratory effort no wheezing rhonchi rales  Abdomen: soft nontender nondistended  Musculoskeletal: having anatomical deformities to bilateral feet, she seems to have pain with flexion of both feet no edema  Data Reviewed: Basic Metabolic Panel:  Recent Labs Lab 05/16/15 1947 05/17/15 0157  NA 135 140  K 3.7 3.8  CL 101 107  CO2 16* 20*  GLUCOSE 108* 83  BUN 26* 23*  CREATININE 1.28* 1.15*  CALCIUM 8.4* 8.1*   Liver Function Tests:  Recent Labs Lab 05/16/15 1947  AST 20  ALT 16  ALKPHOS 74  BILITOT 1.2  PROT 6.9  ALBUMIN 2.6*   No results for input(s): LIPASE, AMYLASE in the last 168 hours. No results for input(s): AMMONIA in the last 168 hours. CBC:  Recent Labs Lab 05/16/15 1947  WBC 18.1*  NEUTROABS 15.1*  HGB 10.4*  HCT 31.4*  MCV 93.2  PLT 293   Cardiac Enzymes: No results for input(s): CKTOTAL, CKMB, CKMBINDEX, TROPONINI in the last 168 hours. BNP (last 3 results) No results for input(s): BNP in the last 8760 hours.  ProBNP (last 3 results) No results for input(s): PROBNP in the last 8760 hours.  CBG:  Recent Labs Lab 05/17/15 0749 05/17/15 1215  GLUCAP 158* 147*    Recent Results (from the past 240 hour(s))  MRSA PCR Screening     Status: None   Collection Time: 05/17/15  2:30 AM  Result Value Ref Range Status   MRSA by  PCR NEGATIVE NEGATIVE Final    Comment:        The GeneXpert MRSA Assay (FDA approved for NASAL specimens only), is one component of a comprehensive MRSA colonization surveillance program. It is not intended to diagnose MRSA infection nor to guide or monitor treatment for MRSA infections.      Studies: Dg Chest 2 View  05/16/2015  CLINICAL DATA:  Fever.  Sepsis. EXAM: CHEST  2 VIEW COMPARISON:  02/02/2005. FINDINGS:  Normal sized heart. Clear lungs. Mild diffuse peribronchial thickening. Unremarkable bones. IMPRESSION: Stable mild chronic bronchitic changes.  No acute abnormality. Electronically Signed   By: Claudie Revering M.D.   On: 05/16/2015 20:22    Scheduled Meds: . acetaminophen  1,000 mg Oral Daily  . aspirin  325 mg Oral Daily  . atorvastatin  10 mg Oral q1800  . ceFEPime (MAXIPIME) IV  2 g Intravenous Q24H  . cholecalciferol  2,000 Units Oral Daily  . enoxaparin (LOVENOX) injection  40 mg Subcutaneous Q24H  . insulin aspart  0-9 Units Subcutaneous TID WC  . insulin glargine  17 Units Subcutaneous BID  . polyvinyl alcohol  1 drop Both Eyes TID  . sodium chloride flush  3 mL Intravenous Q12H   Continuous Infusions:   Principal Problem:   Sepsis ([place]) Active Problems:   Essential hypertension, benign   Psychosis   Depression   Hypothyroidism   Diabetes mellitus, type II, insulin dependent ([place])   AKI (acute kidney injury) ([place])   UTI (lower urinary tract infection)    Time spent:    Kelvin Cellar  [facility] Pager [phone]. If 7PM-7AM, please contact night-coverage at [email or web address], password [facility] 05/17/2015, 3:51 PM  LOS: 0 days

## 2015-05-17 NOTE — Progress Notes (Signed)
Received report from Larysa sisson RN  And agree with assessment.

## 2015-05-17 NOTE — H&P (Signed)
[facility] History and Physical  Ana Newman [id number] DOB: [birth year] DOA: 05/16/2015  Referring physician: ED physician PCP: Ana Duos, MD  Specialists: None listed  Chief Complaint:  Lower abdominal pain, malaise, fevers, diarrhea  HPI: Ana Newman is a 80 y.o. female with PMH of dementia, insulin-dependent diabetes mellitus, hypertension, and hyperlipidemia who presents from her nursing facility with fevers and lower abdominal pain. Patient was diagnosed with a urinary tract infection 2 days ago and started on Rocephin IM at her nursing facility. Despite this treatment, her condition is continued to worsen with daily fevers and persistent lower abdominal pain. Patient does not have a urinary catheter and is not known to have recurrent UTIs. There has been no vomiting noted that the patient has had diarrhea. She typically has chronic constipation for which she takes daily laxatives and stool softeners. History is limited given the patient's dementia.  In ED, patient was found to be febrile to 39.7 C, tachycardic in the 110s, and with soft blood pressure. EKG features sinus tachycardia with inferior Q waves. Chest x-ray is negative for acute cardiopulmonary disease. CMP features a serum creatinine of 1.28, up from an apparent baseline of 0.8. CBC is notable for a leukocytosis to 18,100. Blood and urine cultures were obtained. Patient was given 2.5 L normal saline bolus. She was started on empiric cefepime for suspected urinary tract infection. C. difficile PCR has been ordered given the diarrhea on presentation. Patient's heart rate and blood pressure have improved with fluids and she reports feeling better after receiving Tylenol. She will be admitted to the hospital for ongoing evaluation and management of sepsis with AKI suspected secondary to UTI.   Where does patient live?  SNF      Can patient participate in ADLs?  Some   Review of Systems:  Unable to obtain a  reliable ROS at this time secondary to the patient's clinical condition with dementia.     Allergy: No Known Allergies  Past Medical History  Diagnosis Date  . Reflux esophagitis   . Unspecified constipation   . Essential hypertension, benign   . Alzheimer's disease   . Type II or unspecified type diabetes mellitus without mention of complication, uncontrolled   . Rickets, active   . Unspecified psychosis   . Unspecified vitamin D deficiency   . Osteoarthritis     Past Surgical History  Procedure Laterality Date  . Abdominal hysterectomy    . Joint replacement Left     Social History:  reports that she has never smoked. She has never used smokeless tobacco. She reports that she does not drink alcohol or use illicit drugs.  Family History:  Family History  Problem Relation Age of Onset  . Family history unknown: Yes     Prior to Admission medications   Medication Sig Start Date End Date Taking? Authorizing Provider  acetaminophen (TYLENOL) 500 MG tablet Take 1,000 mg by mouth daily.   Yes Historical Provider, MD  aspirin 325 MG tablet Take 325 mg by mouth daily. Take 1 tablet by mouth once daily.   Yes Historical Provider, MD  atorvastatin (LIPITOR) 10 MG tablet Take 10 mg by mouth daily at 6 PM.   Yes Historical Provider, MD  cefTRIAXone (ROCEPHIN) 1 g injection Inject 1 g into the muscle daily. For 7 Days. Start Date 05/15/15.   Yes Historical Provider, MD  cholecalciferol (VITAMIN D) 1000 UNITS tablet Take 2,000 Units by mouth daily.   Yes Historical Provider, MD  insulin glargine (LANTUS) 100 UNIT/ML injection Inject 20-25 Units into the skin 2 (two) times daily. Take 25 units in the AM and 20 units in the PM   Yes Historical Provider, MD  lisinopril (PRINIVIL,ZESTRIL) 5 MG tablet Take 2.5 mg by mouth daily.   Yes Historical Provider, MD  loratadine (CLARITIN) 10 MG tablet Take 10 mg by mouth daily as needed for allergies.    Yes Historical Provider, MD  metoprolol tartrate  (LOPRESSOR) 25 MG tablet Take 25 mg by mouth 2 (two) times daily. For HTN   Yes Historical Provider, MD  polyethylene glycol (MIRALAX / GLYCOLAX) packet Take 17 g by mouth daily. Give one 17 gram by mouth once daily for constipation.   Yes Historical Provider, MD  Polyvinyl Alcohol-Povidone (FRESHKOTE) 2.7-2 % SOLN Apply 1 drop to eye 3 (three) times daily. To both eyes   Yes Historical Provider, MD  senna-docusate (SENOKOT S) 8.6-50 MG per tablet Take 2 tablets by mouth at bedtime.    Yes Historical Provider, MD  acetaminophen (TYLENOL) 325 MG tablet Take 650 mg by mouth every 6 (six) hours as needed.    Historical Provider, MD  bisacodyl (DULCOLAX) 5 MG EC tablet Take 10 mg by mouth daily as needed for moderate constipation.    Historical Provider, MD  docusate sodium (COLACE) 100 MG capsule Take 100 mg by mouth daily as needed.     Historical Provider, MD  lisinopril (PRINIVIL,ZESTRIL) 2.5 MG tablet Take 2 tablets (5 mg total) by mouth daily. Patient not taking: Reported on 05/16/2015 03/04/14   Gerlene Fee, NP    Physical Exam: Filed Vitals:   05/16/15 2230 05/16/15 2300 05/17/15 0030 05/17/15 0100  BP: 122/49 121/57 126/60 125/57  Pulse: 88 89 84 84  Temp:      TempSrc:      Resp: 25 23 29 29   Height:      Weight:      SpO2: 97% 96% 93% 95%   General: Not in acute distress HEENT:       Eyes: PERRL, EOMI, no scleral icterus or conjunctival pallor.       ENT: No discharge from the ears or nose, no pharyngeal ulcers, oral mucosa dry        Neck: No JVD, no bruit, no appreciable mass Heme: No cervical adenopathy, no pallor Cardiac: Rate ~120 and regular, soft systolic murmur at upper SB, No gallops or rubs. Pulm: Good air movement bilaterally. No rales, wheezing, rhonchi or rubs. Abd: Soft, nondistended, nontender, no rebound pain or gaurding, BS present. Ext: No LE edema bilaterally. 2+DP/PT pulse bilaterally. Musculoskeletal: No gross deformity, no red, hot, swollen joints    Skin: No rashes or wounds on exposed surfaces  Neuro: Alert, oriented to person and place only, cranial nerves II-XII grossly intact. No focal findings Psych: Patient is not overtly psychotic, appropriate mood and affect.  Labs on Admission:  Basic Metabolic Panel:  Recent Labs Lab 05/16/15 1947  NA 135  K 3.7  CL 101  CO2 16*  GLUCOSE 108*  BUN 26*  CREATININE 1.28*  CALCIUM 8.4*   Liver Function Tests:  Recent Labs Lab 05/16/15 1947  AST 20  ALT 16  ALKPHOS 74  BILITOT 1.2  PROT 6.9  ALBUMIN 2.6*   No results for input(s): LIPASE, AMYLASE in the last 168 hours. No results for input(s): AMMONIA in the last 168 hours. CBC:  Recent Labs Lab 05/16/15 1947  WBC 18.1*  NEUTROABS 15.1*  HGB 10.4*  HCT 31.4*  MCV 93.2  PLT 293   Cardiac Enzymes: No results for input(s): CKTOTAL, CKMB, CKMBINDEX, TROPONINI in the last 168 hours.  BNP (last 3 results) No results for input(s): BNP in the last 8760 hours.  ProBNP (last 3 results) No results for input(s): PROBNP in the last 8760 hours.  CBG: No results for input(s): GLUCAP in the last 168 hours.  Radiological Exams on Admission: Dg Chest 2 View  05/16/2015  CLINICAL DATA:  Fever.  Sepsis. EXAM: CHEST  2 VIEW COMPARISON:  02/02/2005. FINDINGS: Normal sized heart. Clear lungs. Mild diffuse peribronchial thickening. Unremarkable bones. IMPRESSION: Stable mild chronic bronchitic changes.  No acute abnormality. Electronically Signed   By: Claudie Revering M.D.   On: 05/16/2015 20:22    EKG: Independently reviewed.  Abnormal findings:  Sinus tachycardia (rate 101), inferior Q-waves  Assessment/Plan  1. Sepsis suspected secondary to UTI  - Meets sepsis criteria on admission with fever, tachycardia, leukocytosis, AKI, and UTI - 30 cc/kg NS bolus given in ED, continuing with NS at 125 cc/hr  - Blood and urine cultures incubating  - Cefepime started empirically, will tailor according to culture data  - Lactate obtained  after the 30 cc/kg bolus and 0.73  - Trend procalcitonin  - Concern for C diff given watery diarrhea, high fevers, high wbc; checking assay as below    2. AKI  - SCr 1.28 on admission, up from apparent baseline of 0.8  - Suspect a prerenal azotemia secondary to sepsis; ATN is possible  - Anticipate improvement with IVF hydration  - Repeat chem panel tomorrow  - Avoid nephrotoxins; hold lisinopril until renal fxn stable    3. Insulin-dependent DM type II - Managed with Lantus at home, 25 units qAM, 20 units qPM  - A1c was 6.2% in December 2015, reflecting excellent control at that time  - Check CBG with meals and qHS  - Continue Lantus at reduced-dose with a SSI correctional, adjust prn  - Carb-modified diet as appropriate  - Update A1c, pending   4. Hypertension - Running on the low side initially  - Managed with lisinopril at home  - Hold lisinopril in setting of AKI, restart once sepsis resolved and renal fxn stable  - Treat for now with hydralazine IVPs prn    5. Diarrhea  - Concern for C diff given recent abx use, high temp (39.7 C), and high wbc (18,100) - C diff PCR pending, will maintain enteric precautions for now  - Follow-up PCR and treat as indicated   DVT ppx:  SQ Lovenox     Code Status: Full code Family Communication: None at bed side.               Disposition Plan: Admit to inpatient   Date of Service 05/17/2015    Vianne Bulls, MD [facility] Pager [phone]  If 7PM-7AM, please contact night-coverage [email or web address] Password [email or web address] 05/17/2015, 1:17 AM

## 2015-05-18 ENCOUNTER — Inpatient Hospital Stay (HOSPITAL_COMMUNITY): Payer: Medicare Other

## 2015-05-18 ENCOUNTER — Encounter (HOSPITAL_COMMUNITY)
Admission: EM | Disposition: A | Discharge status: Skilled Nursing Facility | Payer: Self-pay | Source: Skilled Nursing Facility | Attending: Internal Medicine

## 2015-05-18 ENCOUNTER — Inpatient Hospital Stay (HOSPITAL_COMMUNITY): Payer: Medicare Other | Admitting: Certified Registered Nurse Anesthetist

## 2015-05-18 ENCOUNTER — Encounter (HOSPITAL_COMMUNITY): Payer: Self-pay | Admitting: Radiology

## 2015-05-18 DIAGNOSIS — N200 Calculus of kidney: Secondary | ICD-10-CM

## 2015-05-18 HISTORY — PX: CYSTOSCOPY W/ URETERAL STENT PLACEMENT: SHX1429

## 2015-05-18 LAB — BASIC METABOLIC PANEL
Anion gap: 9 (ref 5–15)
Anion gap: 5 (ref 5–15)
BUN: 13 mg/dL (ref 6–20)
BUN: 13 mg/dL (ref 6–20)
CALCIUM: 8.2 mg/dL — AB (ref 8.9–10.3)
CO2: 24 mmol/L (ref 22–32)
CO2: 26 mmol/L (ref 22–32)
CREATININE: 0.96 mg/dL (ref 0.44–1.00)
Calcium: 7.9 mg/dL — ABNORMAL LOW (ref 8.9–10.3)
Chloride: 104 mmol/L (ref 101–111)
Chloride: 106 mmol/L (ref 101–111)
Creatinine, Ser: 0.96 mg/dL (ref 0.44–1.00)
GFR calc Af Amer: >60 mL/min (ref >60)
GFR calc Af Amer: >60 mL/min (ref >60)
GFR, EST NON AFRICAN AMERICAN: 55 mL/min — AB (ref >60)
GFR, EST NON AFRICAN AMERICAN: 55 mL/min — AB (ref >60)
GLUCOSE: 84 mg/dL (ref 65–99)
Glucose, Bld: 119 mg/dL — ABNORMAL HIGH (ref 65–99)
POTASSIUM: 4.1 mmol/L (ref 3.5–5.1)
Potassium: 4.2 mmol/L (ref 3.5–5.1)
SODIUM: 137 mmol/L (ref 135–145)
SODIUM: 137 mmol/L (ref 135–145)

## 2015-05-18 LAB — GLUCOSE, CAPILLARY
GLUCOSE-CAPILLARY: 184 mg/dL — AB (ref 65–99)
GLUCOSE-CAPILLARY: 91 mg/dL (ref 65–99)
GLUCOSE-CAPILLARY: 76 mg/dL (ref 65–99)
Glucose-Capillary: 63 mg/dL — ABNORMAL LOW (ref 65–99)
Glucose-Capillary: 64 mg/dL — ABNORMAL LOW (ref 65–99)

## 2015-05-18 LAB — CBC
HCT: 29.7 % — ABNORMAL LOW (ref 36.0–46.0)
HCT: 28.6 % — ABNORMAL LOW (ref 36.0–46.0)
Hemoglobin: 9.7 g/dL — ABNORMAL LOW (ref 12.0–15.0)
Hemoglobin: 9.6 g/dL — ABNORMAL LOW (ref 12.0–15.0)
MCH: 29.9 pg (ref 26.0–34.0)
MCH: 30.8 pg (ref 26.0–34.0)
MCHC: 32.7 g/dL (ref 30.0–36.0)
MCHC: 33.6 g/dL (ref 30.0–36.0)
MCV: 91.7 fL (ref 78.0–100.0)
MCV: 91.7 fL (ref 78.0–100.0)
PLATELETS: 317 10*3/uL (ref 150–400)
PLATELETS: 307 10*3/uL (ref 150–400)
RBC: 3.24 MIL/uL — AB (ref 3.87–5.11)
RBC: 3.12 MIL/uL — ABNORMAL LOW (ref 3.87–5.11)
RDW: 13.8 % (ref 11.5–15.5)
RDW: 14 % (ref 11.5–15.5)
WBC: 19.3 10*3/uL — AB (ref 4.0–10.5)
WBC: 18.5 10*3/uL — ABNORMAL HIGH (ref 4.0–10.5)

## 2015-05-18 LAB — URINE CULTURE: CULTURE: NO GROWTH

## 2015-05-18 LAB — LACTIC ACID, PLASMA: Lactic Acid, Venous: 0.7 mmol/L (ref 0.5–2.0)

## 2015-05-18 SURGERY — CYSTOSCOPY, WITH RETROGRADE PYELOGRAM AND URETERAL STENT INSERTION
Anesthesia: General | Laterality: Bilateral

## 2015-05-18 MED ORDER — ONDANSETRON HCL 4 MG/2ML IJ SOLN
4.0000 mg | Freq: Once | INTRAMUSCULAR | Status: AC | PRN
  Administered 2015-05-18: 4 mg via INTRAVENOUS

## 2015-05-18 MED ORDER — LACTATED RINGERS IV SOLN
INTRAVENOUS | Status: DC | PRN
  Administered 2015-05-18: 18:00:00 via INTRAVENOUS

## 2015-05-18 MED ORDER — LIDOCAINE HCL (CARDIAC) 20 MG/ML IV SOLN
INTRAVENOUS | Status: AC
  Filled 2015-05-18: qty 5

## 2015-05-18 MED ORDER — SODIUM CHLORIDE 0.45 % IV SOLN
INTRAVENOUS | Status: DC
  Administered 2015-05-18: 22:00:00 via INTRAVENOUS

## 2015-05-18 MED ORDER — MORPHINE SULFATE (PF) 2 MG/ML IV SOLN: 1.0000 mg | INTRAVENOUS | Status: DC | PRN

## 2015-05-18 MED ORDER — FENTANYL CITRATE (PF) 100 MCG/2ML IJ SOLN: 25.0000 ug | INTRAMUSCULAR | Status: DC | PRN

## 2015-05-18 MED ORDER — IOPAMIDOL (ISOVUE-300) INJECTION 61%
100.0000 mL | Freq: Once | INTRAVENOUS | Status: AC | PRN
  Administered 2015-05-18: 100 mL via INTRAVENOUS

## 2015-05-18 MED ORDER — DIATRIZOATE MEGLUMINE & SODIUM 66-10 % PO SOLN
15.0000 mL | ORAL | Status: AC
Start: 2015-05-18 — End: 2015-05-18
  Administered 2015-05-18 (×2): 15 mL via ORAL

## 2015-05-18 MED ORDER — IOPAMIDOL (ISOVUE-300) INJECTION 61%
INTRAVENOUS | Status: AC
  Filled 2015-05-18: qty 50

## 2015-05-18 MED ORDER — SODIUM CHLORIDE 0.9 % IR SOLN
Status: DC | PRN
  Administered 2015-05-18: 3000 mL via INTRAVESICAL

## 2015-05-18 MED ORDER — ONDANSETRON HCL 4 MG/2ML IJ SOLN
INTRAMUSCULAR | Status: DC | PRN
  Administered 2015-05-18: 4 mg via INTRAVENOUS

## 2015-05-18 MED ORDER — OXYCODONE HCL 5 MG PO TABS
5.0000 mg | ORAL_TABLET | Freq: Four times a day (QID) | ORAL | Status: DC | PRN
  Administered 2015-05-18 – 2015-05-21 (×6): 5 mg via ORAL
  Filled 2015-05-18 (×6): qty 1

## 2015-05-18 MED ORDER — DIATRIZOATE MEGLUMINE & SODIUM 66-10 % PO SOLN: 30.0000 mL | Freq: Once | ORAL | Status: DC

## 2015-05-18 MED ORDER — SODIUM CHLORIDE 0.9 % IV SOLN
INTRAVENOUS | Status: DC
  Administered 2015-05-18 – 2015-05-20 (×3): via INTRAVENOUS

## 2015-05-18 MED ORDER — IOPAMIDOL (ISOVUE-300) INJECTION 61%
INTRAVENOUS | Status: DC | PRN
  Administered 2015-05-18: 50 mL via URETHRAL

## 2015-05-18 MED ORDER — ENOXAPARIN SODIUM 30 MG/0.3ML ~~LOC~~ SOLN: 30.0000 mg | SUBCUTANEOUS | Status: DC

## 2015-05-18 MED ORDER — EPHEDRINE SULFATE 50 MG/ML IJ SOLN
INTRAMUSCULAR | Status: AC
  Filled 2015-05-18: qty 6

## 2015-05-18 MED ORDER — PROPOFOL 10 MG/ML IV BOLUS
INTRAVENOUS | Status: AC
  Filled 2015-05-18: qty 20

## 2015-05-18 MED ORDER — ROCURONIUM BROMIDE 100 MG/10ML IV SOLN
INTRAVENOUS | Status: AC
  Filled 2015-05-18: qty 2

## 2015-05-18 MED ORDER — PROPOFOL 10 MG/ML IV BOLUS
INTRAVENOUS | Status: DC | PRN
  Administered 2015-05-18: 120 mg via INTRAVENOUS

## 2015-05-18 MED ORDER — LIDOCAINE HCL (CARDIAC) 20 MG/ML IV SOLN
INTRAVENOUS | Status: DC | PRN
  Administered 2015-05-18: 50 mg via INTRAVENOUS

## 2015-05-18 MED ORDER — ONDANSETRON HCL 4 MG/2ML IJ SOLN
INTRAMUSCULAR | Status: AC
  Filled 2015-05-18: qty 2

## 2015-05-18 MED ORDER — SODIUM CHLORIDE 0.9 % IJ SOLN
INTRAMUSCULAR | Status: AC
  Filled 2015-05-18: qty 20

## 2015-05-18 MED ORDER — FENTANYL CITRATE (PF) 100 MCG/2ML IJ SOLN
INTRAMUSCULAR | Status: DC | PRN
  Administered 2015-05-18: 50 ug via INTRAVENOUS
  Administered 2015-05-18 (×2): 25 ug via INTRAVENOUS

## 2015-05-18 MED ORDER — MEPERIDINE HCL 50 MG/ML IJ SOLN: 6.2500 mg | INTRAMUSCULAR | Status: DC | PRN

## 2015-05-18 MED ORDER — 0.9 % SODIUM CHLORIDE (POUR BTL) OPTIME
TOPICAL | Status: DC | PRN
  Administered 2015-05-18: 1000 mL

## 2015-05-18 MED ORDER — PHENYLEPHRINE HCL 10 MG/ML IJ SOLN
INTRAMUSCULAR | Status: DC | PRN
  Administered 2015-05-18 (×3): 80 ug via INTRAVENOUS

## 2015-05-18 MED ORDER — FENTANYL CITRATE (PF) 100 MCG/2ML IJ SOLN
INTRAMUSCULAR | Status: AC
  Filled 2015-05-18: qty 2

## 2015-05-18 MED ORDER — ONDANSETRON HCL 4 MG/2ML IJ SOLN: 4.0000 mg | INTRAMUSCULAR | Status: DC | PRN

## 2015-05-18 SURGICAL SUPPLY — 17 items
BAG URO CATCHER STRL LF (MISCELLANEOUS) ×3 IMPLANT
CATH FOLEY 2WAY SLVR  5CC 18FR (CATHETERS) ×2
CATH FOLEY 2WAY SLVR 5CC 18FR (CATHETERS) IMPLANT
CATH INTERMIT  6FR 70CM (CATHETERS) ×3 IMPLANT
CLOTH BEACON ORANGE TIMEOUT ST (SAFETY) ×3 IMPLANT
GLOVE BIOGEL M STRL SZ7.5 (GLOVE) ×3 IMPLANT
GOWN STRL REUS W/TWL LRG LVL3 (GOWN DISPOSABLE) ×3 IMPLANT
GOWN STRL REUS W/TWL XL LVL3 (GOWN DISPOSABLE) ×3 IMPLANT
GUIDEWIRE ANG ZIPWIRE 035X150 (WIRE) ×2 IMPLANT
GUIDEWIRE STR DUAL SENSOR (WIRE) ×3 IMPLANT
MANIFOLD NEPTUNE II (INSTRUMENTS) ×3 IMPLANT
NS IRRIG 1000ML POUR BTL (IV SOLUTION) ×3 IMPLANT
PACK CYSTO (CUSTOM PROCEDURE TRAY) ×3 IMPLANT
SCRUB PCMX 4 OZ (MISCELLANEOUS) ×3 IMPLANT
STENT URET 6FRX24 CONTOUR (STENTS) ×4 IMPLANT
TUBING CONNECTING 10 (TUBING) ×2 IMPLANT
TUBING CONNECTING 10' (TUBING) ×1

## 2015-05-18 NOTE — Progress Notes (Signed)
[facility] PROGRESS NOTE  Ana Newman [id number] DOB: Nov 24, 1935 DOA: 05/16/2015 PCP: Hennie Duos, MD  Assessment/Plan: 1. Sepsis -Present on admission, evidenced by temperature of 103.4, heart rate of 116, respiratory rate of 31, white count of 18,100 -Suspect source of infection to be urinary tract infection and possible C. difficile colitis given diarrhea and recent antimicrobial use. -She was started on cefepime IV -On 05/18/2015 she complained of feeling ill having abdominal pain with palpation on physical exam which was a new finding. I repeat a lactic acid which came back normal at 0.7. Overnight she had spiked another temperature of 101.4. Labs showing a white count of 19,300. Ordered a CT scan with IV contrast given abdominal symptoms. Results pending at the time of this dictation. -Blood culture showing no growth to date  2.  Diarrhea. -She was recently diagnosed with urinary tract infection at her facility and was treated with ceftriaxone IM. -He comes in having multiple episodes of diarrhea -Stool for C. difficile is currently pending.   3.  Acute kidney injury -Labs showing creatinine of 1.28 with BUN of 26, previously 0.8 and 20 on 02/27/2014 -Lab work on 05/18/2015 showing resolution of acute kidney injury  4.  Insulin dependent diabetes mellitus -Blood are stable, continue Lantus 17 units subcutaneous twice a day  Code Status: full code Family Communication: family not present Disposition Plan: continue IV antibiotic therapy  Antibiotics:  Cefepime  HPI/Subjective: Ana Newman is a 80 year old female with past medical history of common of impairment, diabetes mellitus, currently resident at a skilled nursing facility, recently diagnosed with urinary tract infection and started on IM Rocephin. Slight this intervention she continued to have fevers. As on presentation showed a white count of 18,100 with a creatinine 1.28 and BUN of 26. She was  started on broad-spectrum IV antimicrobial therapy with cefepime.  Objective: Filed Vitals:   05/18/15 0157 05/18/15 0530  BP:  156/82  Pulse:  106  Temp: 99.9 F (37.7 C) 98.7 F (37.1 C)  Resp:  18   No intake or output data in the 24 hours ending 05/18/15 1518 Filed Weights   05/16/15 1940 05/17/15 0148 05/18/15 0530  Weight: 73.936 kg (163 lb) 73.9 kg (162 lb 14.7 oz) 73.2 kg (161 lb 6 oz)    Exam:   General:  She appears ill today, states feeling nauseous current 8.  Cardiovascular: tachycardic, regular rate rhythm normal S1-S2 no murmurs or gallops  Respiratory: she is having normal respiratory effort no wheezing rhonchi rales  Abdomen: There is now pain to palpation over epigastric region no rebound tenderness or guarding  Musculoskeletal: having anatomical deformities to bilateral feet, she seems to have pain with flexion of both feet no edema  Data Reviewed: Basic Metabolic Panel:  Recent Labs Lab 05/16/15 1947 05/17/15 0157 05/18/15 0531  NA 135 140 137  K 3.7 3.8 4.1  CL 101 107 104  CO2 16* 20* 24  GLUCOSE 108* 83 119*  BUN 26* 23* 13  CREATININE 1.28* 1.15* 0.96  CALCIUM 8.4* 8.1* 8.2*   Liver Function Tests:  Recent Labs Lab 05/16/15 1947  AST 20  ALT 16  ALKPHOS 74  BILITOT 1.2  PROT 6.9  ALBUMIN 2.6*   No results for input(s): LIPASE, AMYLASE in the last 168 hours. No results for input(s): AMMONIA in the last 168 hours. CBC:  Recent Labs Lab 05/16/15 1947 05/18/15 0531  WBC 18.1* 19.3*  NEUTROABS 15.1*  --   HGB 10.4* 9.7*  HCT 31.4* 29.7*  MCV 93.2 91.7  PLT 293 317   Cardiac Enzymes: No results for input(s): CKTOTAL, CKMB, CKMBINDEX, TROPONINI in the last 168 hours. BNP (last 3 results) No results for input(s): BNP in the last 8760 hours.  ProBNP (last 3 results) No results for input(s): PROBNP in the last 8760 hours.  CBG:  Recent Labs Lab 05/17/15 1215 05/17/15 1726 05/17/15 2106 05/18/15 0742 05/18/15 1157   GLUCAP 147* 139* 172* 184* 91    Recent Results (from the past 240 hour(s))  Blood Culture (routine x 2)     Status: None (Preliminary result)   Collection Time: 05/16/15  7:44 PM  Result Value Ref Range Status   Specimen Description BLOOD LEFT HAND  Final   Special Requests BOTTLES DRAWN AEROBIC AND ANAEROBIC 5 ML  Final   Culture   Final    NO GROWTH < 24 HOURS Performed at [facility]    Report Status PENDING  Incomplete  Blood Culture (routine x 2)     Status: None (Preliminary result)   Collection Time: 05/16/15  7:50 PM  Result Value Ref Range Status   Specimen Description BLOOD LEFT ANTECUBITAL  Final   Special Requests BOTTLES DRAWN AEROBIC AND ANAEROBIC 5 ML  Final   Culture   Final    NO GROWTH < 24 HOURS Performed at [facility]    Report Status PENDING  Incomplete  Urine culture     Status: None   Collection Time: 05/16/15 11:01 PM  Result Value Ref Range Status   Specimen Description URINE, CATHETERIZED  Final   Special Requests NONE  Final   Culture   Final    NO GROWTH 1 DAY Performed at [facility]    Report Status 05/18/2015 FINAL  Final  MRSA PCR Screening     Status: None   Collection Time: 05/17/15  2:30 AM  Result Value Ref Range Status   MRSA by PCR NEGATIVE NEGATIVE Final    Comment:        The GeneXpert MRSA Assay (FDA approved for NASAL specimens only), is one component of a comprehensive MRSA colonization surveillance program. It is not intended to diagnose MRSA infection nor to guide or monitor treatment for MRSA infections.      Studies: Dg Chest 2 View  05/16/2015  CLINICAL DATA:  Fever.  Sepsis. EXAM: CHEST  2 VIEW COMPARISON:  02/02/2005. FINDINGS: Normal sized heart. Clear lungs. Mild diffuse peribronchial thickening. Unremarkable bones. IMPRESSION: Stable mild chronic bronchitic changes.  No acute abnormality. Electronically Signed   By: Claudie Revering M.D.   On: 05/16/2015 20:22   Dg Chest Port 1  View  05/18/2015  CLINICAL DATA:  Sepsis.  Nausea. EXAM: PORTABLE CHEST 1 VIEW COMPARISON:  05/16/2015. FINDINGS: Poor inspiration. Rotation to the right. Normal sized heart. Clear lungs. Mildly prominent interstitial markings. Diffuse osteopenia. IMPRESSION: No acute abnormality.  Mild chronic interstitial lung disease. Electronically Signed   By: Claudie Revering M.D.   On: 05/18/2015 10:56    Scheduled Meds: . acetaminophen  1,000 mg Oral Daily  . aspirin  325 mg Oral Daily  . atorvastatin  10 mg Oral q1800  . ceFEPime (MAXIPIME) IV  2 g Intravenous Q24H  . cholecalciferol  2,000 Units Oral Daily  . diatrizoate meglumine-sodium  30 mL Oral Once  . enoxaparin (LOVENOX) injection  40 mg Subcutaneous Q24H  . insulin aspart  0-9 Units Subcutaneous TID WC  . insulin glargine  17  Units Subcutaneous BID  . polyvinyl alcohol  1 drop Both Eyes TID  . sodium chloride flush  3 mL Intravenous Q12H   Continuous Infusions: . sodium chloride 75 mL/hr at 05/18/15 1101    Principal Problem:   Sepsis ([place]) Active Problems:   Essential hypertension, benign   Psychosis   Depression   Hypothyroidism   Diabetes mellitus, type II, insulin dependent ([place])   AKI (acute kidney injury) ([place])   UTI (lower urinary tract infection)    Time spent: 35 minutes   Kelvin Cellar  [facility] Pager [phone]. If 7PM-7AM, please contact night-coverage at [email or web address], password [facility] 05/18/2015, 3:18 PM  LOS: 1 day

## 2015-05-18 NOTE — Interval H&P Note (Signed)
History and Physical Interval Note:  05/18/2015 7:48 PM  Ana Newman  has presented today for surgery, with the diagnosis of bilateral ureteral stones  The various methods of treatment have been discussed with the patient and family. After consideration of risks, benefits and other options for treatment, the patient has consented to  Procedure(s): CYSTOSCOPY WITH RETROGRADE PYELOGRAM/URETERAL STENT PLACEMENT (Bilateral) as a surgical intervention .  The patient's history has been reviewed, patient examined, no change in status, stable for surgery.  I have reviewed the patient's chart and labs.  Questions were answered to the patient's satisfaction.     Keilany Burnette I Josef Tourigny

## 2015-05-18 NOTE — H&P (View-Only) (Signed)
Urology Consult  Referring physician:  Kelvin Cellar Reason for referral:  Bilateral nephroureteral stones  Impression/Assessment:  CT review shows bilateral obstructing stones, including: (a)_ 187m Left lower ureter stone; 18.7x8.956mLeft UPJ stone; with associated hydronephrosis and renal scarring; multiple 3-87m287meft renal cortical stones; right 8 mm UPJ stone, hydronephrosis.  In addition, she has:  1. Sepsis -Present on admission, evidenced by temperature of 103.4, heart rate of 116, respiratory rate of 31, white count of 18,100 -Suspect source of infection to be urinary tract infection and possible C. difficile colitis given diarrhea and recent antimicrobial use. -She was started on cefepime IV -Continue IV fluids, supportive care, C. difficile pending, blood cultures and urine cultures pending  2. Diarrhea. -She was recently diagnosed with urinary tract infection at her facility and was treated with ceftriaxone IM. -He comes in having multiple episodes of diarrhea -Stool for C. difficile is currently pending.   3. Acute kidney injury -Labs showing creatinine of 1.28 with BUN of 26, previously 0.8 and 20 on 02/27/2014  4. Insulin dependent diabetes mellitus -Blood are stable, continue Lantus 17 units subcutaneous twice a day  Code Status: full code  Plan:    Discussed with Dr.. Marland KitchenmCoralyn Pearhe has dementia, and will need to have permit from family. She will need cysto bilateral JJ stents.she will not clear her infection without drainage ( either internal or external percs. ).    History of Present Illness:  [age]ar old female with  dementia, diabetes, resident of skilled nursing facility has recent onset urinary tract infection, treated with Rocephin.She is not improved, and has a white blood cell count of 18,100, with a creatinine 1.28. She was begun on broad-spectrum IV antibiotictherapy, and [facility].  Past Medical History  Diagnosis Date  . Reflux  esophagitis   . Unspecified constipation   . Essential hypertension, benign   . Alzheimer's disease   . Type II or unspecified type diabetes mellitus without mention of complication, uncontrolled   . Rickets, active   . Unspecified psychosis   . Unspecified vitamin D deficiency   . Osteoarthritis    Past Surgical History  Procedure Laterality Date  . Abdominal hysterectomy    . Joint replacement Left     Medications: Cefepime.  Medication Sig Start Date End Date Taking? Authorizing Provider  acetaminophen (TYLENOL) 500 MG tablet Take 1,000 mg by mouth daily.   Yes Historical Provider, MD  aspirin 325 MG tablet Take 325 mg by mouth daily. Take 1 tablet by mouth once daily.   Yes Historical Provider, MD  atorvastatin (LIPITOR) 10 MG tablet Take 10 mg by mouth daily at 6 PM.   Yes Historical Provider, MD  cefTRIAXone (ROCEPHIN) 1 g injection Inject 1 g into the muscle daily. For 7 Days. Start Date 05/15/15.   Yes Historical Provider, MD  cholecalciferol (VITAMIN D) 1000 UNITS tablet Take 2,000 Units by mouth daily.   Yes Historical Provider, MD  insulin glargine (LANTUS) 100 UNIT/ML injection Inject 20-25 Units into the skin 2 (two) times daily. Take 25 units in the AM and 20 units in the PM   Yes Historical Provider, MD  lisinopril (PRINIVIL,ZESTRIL) 5 MG tablet Take 2.5 mg by mouth daily.   Yes Historical Provider, MD  loratadine (CLARITIN) 10 MG tablet Take 10 mg by mouth daily as needed for allergies.    Yes Historical Provider, MD  metoprolol tartrate (LOPRESSOR) 25 MG tablet Take 25 mg by mouth 2 (two) times daily. For HTN  Yes Historical Provider, MD  polyethylene glycol (MIRALAX / GLYCOLAX) packet Take 17 g by mouth daily. Give one 17 gram by mouth once daily for constipation.   Yes Historical Provider, MD  Polyvinyl Alcohol-Povidone (FRESHKOTE) 2.7-2 % SOLN Apply 1 drop to eye 3 (three) times daily. To both eyes    Yes Historical Provider, MD  senna-docusate (SENOKOT S) 8.6-50 MG per tablet Take 2 tablets by mouth at bedtime.    Yes Historical Provider, MD  acetaminophen (TYLENOL) 325 MG tablet Take 650 mg by mouth every 6 (six) hours as needed.    Historical Provider, MD  bisacodyl (DULCOLAX) 5 MG EC tablet Take 10 mg by mouth daily as needed for moderate constipation.    Historical Provider, MD  docusate sodium (COLACE) 100 MG capsule Take 100 mg by mouth daily as needed.     Historical Provider, MD  lisinopril (PRINIVIL,ZESTRIL) 2.5 MG tablet Take 2 tablets (5 mg total) by mouth daily. Patient not taking: Reported on 05/16/2015 03/04/14   Gerlene Fee, NP        Allergies: No Known Allergies  Family History  Problem Relation Age of Onset  . Family history unknown: Yes    Social History:  reports that she has never smoked. She has never used smokeless tobacco. She reports that she does not drink alcohol or use illicit drugs.  ROS diarrhea.   Physical Exam:  Vital signs in last 24 hours: Temp:  [98.6 F (37 C)-101.4 F (38.6 C)] 98.6 F (37 C) (04/16 1629) Pulse Rate:  [78-117] 78 (04/16 1629) Resp:  [18-20] 20 (04/16 1629) BP: (135-164)/(56-82) 135/56 mmHg (04/16 1629) SpO2:  [95 %-96 %] 95 % (04/16 1629) Weight:  [73.2 kg (161 lb 6 oz)] 73.2 kg (161 lb 6 oz) (04/16 0530) Physical Exam  Laboratory Data:  Results for orders placed or performed during the hospital encounter of 05/16/15 (from the past 72 hour(s))  Blood Culture (routine x 2)     Status: None (Preliminary result)   Collection Time: 05/16/15  7:44 PM  Result Value Ref Range   Specimen Description BLOOD LEFT HAND    Special Requests BOTTLES DRAWN AEROBIC AND ANAEROBIC 5 ML    Culture      NO GROWTH < 24 HOURS Performed at [facility]    Report Status PENDING   Comprehensive metabolic panel     Status: Abnormal   Collection Time: 05/16/15  7:47 PM  Result Value Ref Range    Sodium 135 135 - 145 mmol/L   Potassium 3.7 3.5 - 5.1 mmol/L   Chloride 101 101 - 111 mmol/L   CO2 16 (L) 22 - 32 mmol/L   Glucose, Bld 108 (H) 65 - 99 mg/dL   BUN 26 (H) 6 - 20 mg/dL   Creatinine, Ser 1.28 (H) 0.44 - 1.00 mg/dL   Calcium 8.4 (L) 8.9 - 10.3 mg/dL   Total Protein 6.9 6.5 - 8.1 g/dL   Albumin 2.6 (L) 3.5 - 5.0 g/dL   AST 20 15 - 41 U/L   ALT 16 14 - 54 U/L   Alkaline Phosphatase 74 38 - 126 U/L   Total Bilirubin 1.2 0.3 - 1.2 mg/dL   GFR calc non Af Amer 39 (L) >60 mL/min   GFR calc Af Amer 45 (L) >60 mL/min    Comment: (NOTE) The eGFR has been calculated using the CKD EPI equation. This calculation has not been validated in all clinical situations. eGFR's persistently <60 mL/min signify  possible Chronic Kidney Disease.    Anion gap 18 (H) 5 - 15  CBC WITH DIFFERENTIAL     Status: Abnormal   Collection Time: 05/16/15  7:47 PM  Result Value Ref Range   WBC 18.1 (H) 4.0 - 10.5 K/uL   RBC 3.37 (L) 3.87 - 5.11 MIL/uL   Hemoglobin 10.4 (L) 12.0 - 15.0 g/dL   HCT 31.4 (L) 36.0 - 46.0 %   MCV 93.2 78.0 - 100.0 fL   MCH 30.9 26.0 - 34.0 pg   MCHC 33.1 30.0 - 36.0 g/dL   RDW 14.2 11.5 - 15.5 %   Platelets 293 150 - 400 K/uL   Neutrophils Relative % 83 %   Lymphocytes Relative 9 %   Monocytes Relative 8 %   Eosinophils Relative 0 %   Basophils Relative 0 %   Neutro Abs 15.1 (H) 1.7 - 7.7 K/uL   Lymphs Abs 1.6 0.7 - 4.0 K/uL   Monocytes Absolute 1.4 (H) 0.1 - 1.0 K/uL   Eosinophils Absolute 0.0 0.0 - 0.7 K/uL   Basophils Absolute 0.0 0.0 - 0.1 K/uL   Smear Review MORPHOLOGY UNREMARKABLE   Blood Culture (routine x 2)     Status: None (Preliminary result)   Collection Time: 05/16/15  7:50 PM  Result Value Ref Range   Specimen Description BLOOD LEFT ANTECUBITAL    Special Requests BOTTLES DRAWN AEROBIC AND ANAEROBIC 5 ML    Culture      NO GROWTH < 24 HOURS Performed at [facility]    Report Status PENDING   Urinalysis, Routine w reflex microscopic  (not at [facility])     Status: Abnormal   Collection Time: 05/16/15 11:01 PM  Result Value Ref Range   Color, Urine YELLOW YELLOW   APPearance CLOUDY (A) CLEAR   Specific Gravity, Urine 1.022 1.005 - 1.030   pH 6.0 5.0 - 8.0   Glucose, UA NEGATIVE NEGATIVE mg/dL   Hgb urine dipstick MODERATE (A) NEGATIVE   Bilirubin Urine NEGATIVE NEGATIVE   Ketones, ur 40 (A) NEGATIVE mg/dL   Protein, ur 30 (A) NEGATIVE mg/dL   Nitrite NEGATIVE NEGATIVE   Leukocytes, UA MODERATE (A) NEGATIVE  Urine culture     Status: None   Collection Time: 05/16/15 11:01 PM  Result Value Ref Range   Specimen Description URINE, CATHETERIZED    Special Requests NONE    Culture      NO GROWTH 1 DAY Performed at [facility]    Report Status 05/18/2015 FINAL   Urine microscopic-add on     Status: Abnormal   Collection Time: 05/16/15 11:01 PM  Result Value Ref Range   Squamous Epithelial / LPF 0-5 (A) NONE SEEN   WBC, UA 6-30 0 - 5 WBC/hpf   RBC / HPF 6-30 0 - 5 RBC/hpf   Bacteria, UA FEW (A) NONE SEEN  I-Stat CG4 Lactic Acid, ED  (not at  [facility])     Status: None   Collection Time: 05/16/15 11:13 PM  Result Value Ref Range   Lactic Acid, Venous 0.73 0.5 - 2.0 mmol/L  Procalcitonin     Status: None   Collection Time: 05/17/15  1:57 AM  Result Value Ref Range   Procalcitonin 22.34 ng/mL    Comment:        Interpretation: PCT >= 10 ng/mL: Important systemic inflammatory response, almost exclusively due to severe bacterial sepsis or septic shock. (NOTE)         ICU PCT Algorithm  Non ICU PCT Algorithm    ----------------------------     ------------------------------         PCT < 0.25 ng/mL                 PCT < 0.1 ng/mL     Stopping of antibiotics            Stopping of antibiotics       strongly encouraged.               strongly encouraged.    ----------------------------     ------------------------------       PCT level decrease by               PCT < 0.25 ng/mL       >= 80% from  peak PCT       OR PCT 0.25 - 0.5 ng/mL          Stopping of antibiotics                                             encouraged.     Stopping of antibiotics           encouraged.    ----------------------------     ------------------------------       PCT level decrease by              PCT >= 0.25 ng/mL       < 80% from peak PCT        AND PCT >= 0.5 ng/mL             Continuing antibiotics                                              encouraged.       Continuing antibiotics            encouraged.    ----------------------------     ------------------------------     PCT level increase compared          PCT > 0.5 ng/mL         with peak PCT AND          PCT >= 0.5 ng/mL             Escalation of antibiotics                                          strongly encouraged.      Escalation of antibiotics        strongly encouraged.   Protime-INR     Status: None   Collection Time: 05/17/15  1:57 AM  Result Value Ref Range   Prothrombin Time 15.0 11.6 - 15.2 seconds   INR 1.16 0.00 - 1.49  APTT     Status: None   Collection Time: 05/17/15  1:57 AM  Result Value Ref Range   aPTT 29 24 - 37 seconds  Basic metabolic panel     Status: Abnormal   Collection Time: 05/17/15  1:57 AM  Result Value Ref Range   Sodium 140 135 - 145 mmol/L   Potassium 3.8 3.5 - 5.1  mmol/L   Chloride 107 101 - 111 mmol/L   CO2 20 (L) 22 - 32 mmol/L   Glucose, Bld 83 65 - 99 mg/dL   BUN 23 (H) 6 - 20 mg/dL   Creatinine, Ser 1.15 (H) 0.44 - 1.00 mg/dL   Calcium 8.1 (L) 8.9 - 10.3 mg/dL   GFR calc non Af Amer 44 (L) >60 mL/min   GFR calc Af Amer 51 (L) >60 mL/min    Comment: (NOTE) The eGFR has been calculated using the CKD EPI equation. This calculation has not been validated in all clinical situations. eGFR's persistently <60 mL/min signify possible Chronic Kidney Disease.    Anion gap 13 5 - 15  MRSA PCR Screening     Status: None   Collection Time: 05/17/15  2:30 AM  Result Value Ref Range   MRSA by  PCR NEGATIVE NEGATIVE    Comment:        The GeneXpert MRSA Assay (FDA approved for NASAL specimens only), is one component of a comprehensive MRSA colonization surveillance program. It is not intended to diagnose MRSA infection nor to guide or monitor treatment for MRSA infections.   Glucose, capillary     Status: Abnormal   Collection Time: 05/17/15  7:49 AM  Result Value Ref Range   Glucose-Capillary 158 (H) 65 - 99 mg/dL  Glucose, capillary     Status: Abnormal   Collection Time: 05/17/15 12:15 PM  Result Value Ref Range   Glucose-Capillary 147 (H) 65 - 99 mg/dL  Glucose, capillary     Status: Abnormal   Collection Time: 05/17/15  5:26 PM  Result Value Ref Range   Glucose-Capillary 139 (H) 65 - 99 mg/dL  Glucose, capillary     Status: Abnormal   Collection Time: 05/17/15  9:06 PM  Result Value Ref Range   Glucose-Capillary 172 (H) 65 - 99 mg/dL  CBC     Status: Abnormal   Collection Time: 05/18/15  5:31 AM  Result Value Ref Range   WBC 19.3 (H) 4.0 - 10.5 K/uL   RBC 3.24 (L) 3.87 - 5.11 MIL/uL   Hemoglobin 9.7 (L) 12.0 - 15.0 g/dL   HCT 29.7 (L) 36.0 - 46.0 %   MCV 91.7 78.0 - 100.0 fL   MCH 29.9 26.0 - 34.0 pg   MCHC 32.7 30.0 - 36.0 g/dL   RDW 13.8 11.5 - 15.5 %   Platelets 317 150 - 400 K/uL  Basic metabolic panel     Status: Abnormal   Collection Time: 05/18/15  5:31 AM  Result Value Ref Range   Sodium 137 135 - 145 mmol/L   Potassium 4.1 3.5 - 5.1 mmol/L   Chloride 104 101 - 111 mmol/L   CO2 24 22 - 32 mmol/L   Glucose, Bld 119 (H) 65 - 99 mg/dL   BUN 13 6 - 20 mg/dL   Creatinine, Ser 0.96 0.44 - 1.00 mg/dL   Calcium 8.2 (L) 8.9 - 10.3 mg/dL   GFR calc non Af Amer 55 (L) >60 mL/min   GFR calc Af Amer >60 >60 mL/min    Comment: (NOTE) The eGFR has been calculated using the CKD EPI equation. This calculation has not been validated in all clinical situations. eGFR's persistently <60 mL/min signify possible Chronic Kidney Disease.    Anion gap 9 5 - 15   Glucose, capillary     Status: Abnormal   Collection Time: 05/18/15  7:42 AM  Result Value Ref Range   Glucose-Capillary 184 (  H) 65 - 99 mg/dL  Glucose, capillary     Status: None   Collection Time: 05/18/15 11:57 AM  Result Value Ref Range   Glucose-Capillary 91 65 - 99 mg/dL  Lactic acid, plasma     Status: None   Collection Time: 05/18/15 12:03 PM  Result Value Ref Range   Lactic Acid, Venous 0.7 0.5 - 2.0 mmol/L   Recent Results (from the past 240 hour(s))  Blood Culture (routine x 2)     Status: None (Preliminary result)   Collection Time: 05/16/15  7:44 PM  Result Value Ref Range Status   Specimen Description BLOOD LEFT HAND  Final   Special Requests BOTTLES DRAWN AEROBIC AND ANAEROBIC 5 ML  Final   Culture   Final    NO GROWTH < 24 HOURS Performed at [facility]    Report Status PENDING  Incomplete  Blood Culture (routine x 2)     Status: None (Preliminary result)   Collection Time: 05/16/15  7:50 PM  Result Value Ref Range Status   Specimen Description BLOOD LEFT ANTECUBITAL  Final   Special Requests BOTTLES DRAWN AEROBIC AND ANAEROBIC 5 ML  Final   Culture   Final    NO GROWTH < 24 HOURS Performed at [facility]    Report Status PENDING  Incomplete  Urine culture     Status: None   Collection Time: 05/16/15 11:01 PM  Result Value Ref Range Status   Specimen Description URINE, CATHETERIZED  Final   Special Requests NONE  Final   Culture   Final    NO GROWTH 1 DAY Performed at [facility]    Report Status 05/18/2015 FINAL  Final  MRSA PCR Screening     Status: None   Collection Time: 05/17/15  2:30 AM  Result Value Ref Range Status   MRSA by PCR NEGATIVE NEGATIVE Final    Comment:        The GeneXpert MRSA Assay (FDA approved for NASAL specimens only), is one component of a comprehensive MRSA colonization surveillance program. It is not intended to diagnose MRSA infection nor to guide or monitor treatment for MRSA infections.     Creatinine:  Recent Labs  05/16/15 1947 05/17/15 0157 05/18/15 0531  CREATININE 1.28* 1.15* 0.96   Baseline Creatinine:   CLINICAL DATA: Urinary tract infection. Fevers. Abdominal tenderness. Elevated white blood cell count. C. difficile. Hysterectomy.  EXAM: CT ABDOMEN AND PELVIS WITH CONTRAST  TECHNIQUE: Multidetector CT imaging of the abdomen and pelvis was performed using the standard protocol following bolus administration of intravenous contrast.  CONTRAST: 100 cc of Isovue-300  COMPARISON: 11/01/2003  FINDINGS: Lower chest: Minimal motion degradation. Left base atelectasis. Lipomatous hypertrophy of the interatrial septum. Normal heart size. Trace left pleural fluid. Small hiatal hernia. Minimal enlargement of right cardiophrenic angle nodes which are up to 8 mm on image 10/series 2. Likely reactive.  Hepatobiliary: Development of moderate cirrhosis with probable mild hepatic steatosis. No focal liver lesion. Motion degradation continuing into the upper abdomen. Evaluation also degraded by beam hardening artifact from lumbar spine fixation hardware.  Normal gallbladder, without biliary ductal dilatation.  Pancreas: Normal, without mass or ductal dilatation.  Spleen: Normal in size, without focal abnormality.  Adrenals/Urinary Tract: Normal adrenal glands. Right renal collecting system stones, including a 5 mm stone in the right extrarenal pelvis.  Bilateral too small to characterize renal lesions. An interpolar left renal cyst or minimally complex cyst at 10 mm.  Upper pole  left renal lesion measures fluid density and 2.1 cm, consistent with a cyst.  Multiple left-sided renal collecting system calculi, including multiple stones within the left renal pelvis. Moderate left-sided hydroureteronephrosis with decreased renal function, as evidenced by lack of contrast excretion on delayed images. Moderate left hydroureter continues to the  level of a distal left ureteric stone which measures 11x13 mm on image 74/series 2.  Mild bladder wall irregularity with a small left-sided diverticulum identified.  Stomach/Bowel: Proximal gastric underdistention. Gastric antrum is also underdistended. Apparent wall thickening could be secondary. Example image 29/series 2.  Rectal catheter in place. Moderate rectal wall thickening, including on image 81/series 2. Scattered colonic diverticula. Normal terminal ileum and appendix. Normal small bowel.  Vascular/Lymphatic: Advanced aortic and branch vessel atherosclerosis. Hepatic and portal veins patent. No specific findings of portal venous hypertension. No splenic artery aneurysm. Prominent retroperitoneal nodes are not pathologic by size criteria and likely reactive. No pelvic sidewall adenopathy.  Reproductive: Hysterectomy. No adnexal mass.  Other: No significant free fluid. Soft tissue density at the origin of the right inguinal canal could be postoperative and was present on the prior exam. Moderate pelvic floor laxity. Small volume abdominal ascites.  Musculoskeletal: Skin irregularity about the coccyx, including on image 77/series 2. Bilateral hip osteoarthritis. Proximal left femoral fixation. No gross osseous destruction about the coccyx. Osteopenia. Lumbar spine fixation.  IMPRESSION: 1. Motion and hardware degraded images. 2. Moderate left-sided hydroureteronephrosis secondary to a distal left ureteric 11 x 13 mm calculus. Decreased left-sided renal function. 3. Large burden bilateral renal collecting system stones, including stones within both extrarenal pelves. 4. Cirrhosis with small volume ascites. 5. Proctitis, likely related to C. difficile colitis, given the clinical history. 6. Bladder wall irregularity which could relate to cystitis. Bladder diverticulum, suggesting a component of outlet obstruction. 7. Skin irregularity superficial to the  coccyx. Recommend physical exam correlation to exclude decubitus ulcer. No gross osseous destruction. These results will be called to the ordering clinician or representative by the Radiologist Assistant, and communication documented in the PACS or zVision Dashboard.   Electronically Signed  By: Abigail Miyamoto M.D.  On: 05/18/2015 15:57           Darrik Richman I Brittlyn Cloe 05/18/2015, 5:20 PM

## 2015-05-18 NOTE — Consult Note (Signed)
Urology Consult  Referring physician:  Kelvin Cellar Reason for referral:  Bilateral nephroureteral stones  Impression/Assessment:  CT review shows bilateral obstructing stones, including: (a)_ 37m Left lower ureter stone; 18.7x8.9570mLeft UPJ stone; with associated hydronephrosis and renal scarring; multiple 3-70m29meft renal cortical stones; right 8 mm UPJ stone, hydronephrosis.  In addition, she has:  1. Sepsis -Present on admission, evidenced by temperature of 103.4, heart rate of 116, respiratory rate of 31, white count of 18,100 -Suspect source of infection to be urinary tract infection and possible C. difficile colitis given diarrhea and recent antimicrobial use. -She was started on cefepime IV -Continue IV fluids, supportive care, C. difficile pending, blood cultures and urine cultures pending  2. Diarrhea. -She was recently diagnosed with urinary tract infection at her facility and was treated with ceftriaxone IM. -He comes in having multiple episodes of diarrhea -Stool for C. difficile is currently pending.   3. Acute kidney injury -Labs showing creatinine of 1.28 with BUN of 26, previously 0.8 and 20 on 02/27/2014  4. Insulin dependent diabetes mellitus -Blood are stable, continue Lantus 17 units subcutaneous twice a day  Code Status: full code  Plan:    Discussed with Dr.. Marland KitchenmCoralyn Pearhe has dementia, and will need to have permit from family. She will need cysto bilateral JJ stents.she will not clear her infection without drainage ( either internal or external percs. ).    History of Present Illness:  [age]ar old female with  dementia, diabetes, resident of skilled nursing facility has recent onset urinary tract infection, treated with Rocephin.She is not improved, and has a white blood cell count of 18,100, with a creatinine 1.28. She was begun on broad-spectrum IV antibiotictherapy, [facility]  Past Medical History  Diagnosis Date  . Reflux  esophagitis   . Unspecified constipation   . Essential hypertension, benign   . Alzheimer's disease   . Type II or unspecified type diabetes mellitus without mention of complication, uncontrolled   . Rickets, active   . Unspecified psychosis   . Unspecified vitamin D deficiency   . Osteoarthritis    Past Surgical History  Procedure Laterality Date  . Abdominal hysterectomy    . Joint replacement Left     Medications: Cefepime.  Medication Sig Start Date End Date Taking? Authorizing Provider  acetaminophen (TYLENOL) 500 MG tablet Take 1,000 mg by mouth daily.   Yes Historical Provider, MD  aspirin 325 MG tablet Take 325 mg by mouth daily. Take 1 tablet by mouth once daily.   Yes Historical Provider, MD  atorvastatin (LIPITOR) 10 MG tablet Take 10 mg by mouth daily at 6 PM.   Yes Historical Provider, MD  cefTRIAXone (ROCEPHIN) 1 g injection Inject 1 g into the muscle daily. For 7 Days. Start Date 05/15/15.   Yes Historical Provider, MD  cholecalciferol (VITAMIN D) 1000 UNITS tablet Take 2,000 Units by mouth daily.   Yes Historical Provider, MD  insulin glargine (LANTUS) 100 UNIT/ML injection Inject 20-25 Units into the skin 2 (two) times daily. Take 25 units in the AM and 20 units in the PM   Yes Historical Provider, MD  lisinopril (PRINIVIL,ZESTRIL) 5 MG tablet Take 2.5 mg by mouth daily.   Yes Historical Provider, MD  loratadine (CLARITIN) 10 MG tablet Take 10 mg by mouth daily as needed for allergies.    Yes Historical Provider, MD  metoprolol tartrate (LOPRESSOR) 25 MG tablet Take 25 mg by mouth 2 (two) times daily. For HTN  Yes Historical Provider, MD  polyethylene glycol (MIRALAX / GLYCOLAX) packet Take 17 g by mouth daily. Give one 17 gram by mouth once daily for constipation.   Yes Historical Provider, MD  Polyvinyl Alcohol-Povidone (FRESHKOTE) 2.7-2 % SOLN Apply 1 drop to eye 3 (three) times daily. To both eyes    Yes Historical Provider, MD  senna-docusate (SENOKOT S) 8.6-50 MG per tablet Take 2 tablets by mouth at bedtime.    Yes Historical Provider, MD  acetaminophen (TYLENOL) 325 MG tablet Take 650 mg by mouth every 6 (six) hours as needed.    Historical Provider, MD  bisacodyl (DULCOLAX) 5 MG EC tablet Take 10 mg by mouth daily as needed for moderate constipation.    Historical Provider, MD  docusate sodium (COLACE) 100 MG capsule Take 100 mg by mouth daily as needed.     Historical Provider, MD  lisinopril (PRINIVIL,ZESTRIL) 2.5 MG tablet Take 2 tablets (5 mg total) by mouth daily. Patient not taking: Reported on 05/16/2015 03/04/14   Gerlene Fee, NP        Allergies: No Known Allergies  Family History  Problem Relation Age of Onset  . Family history unknown: Yes    Social History:  reports that she has never smoked. She has never used smokeless tobacco. She reports that she does not drink alcohol or use illicit drugs.  ROS diarrhea.   Physical Exam:  Vital signs in last 24 hours: Temp:  [98.6 F (37 C)-101.4 F (38.6 C)] 98.6 F (37 C) (04/16 1629) Pulse Rate:  [78-117] 78 (04/16 1629) Resp:  [18-20] 20 (04/16 1629) BP: (135-164)/(56-82) 135/56 mmHg (04/16 1629) SpO2:  [95 %-96 %] 95 % (04/16 1629) Weight:  [73.2 kg (161 lb 6 oz)] 73.2 kg (161 lb 6 oz) (04/16 0530) Physical Exam  Laboratory Data:  Results for orders placed or performed during the hospital encounter of 05/16/15 (from the past 72 hour(s))  Blood Culture (routine x 2)     Status: None (Preliminary result)   Collection Time: 05/16/15  7:44 PM  Result Value Ref Range   Specimen Description BLOOD LEFT HAND    Special Requests BOTTLES DRAWN AEROBIC AND ANAEROBIC 5 ML    Culture      NO GROWTH < 24 HOURS Performed at [facility]    Report Status PENDING   Comprehensive metabolic panel     Status: Abnormal   Collection Time: 05/16/15  7:47 PM  Result Value Ref Range    Sodium 135 135 - 145 mmol/L   Potassium 3.7 3.5 - 5.1 mmol/L   Chloride 101 101 - 111 mmol/L   CO2 16 (L) 22 - 32 mmol/L   Glucose, Bld 108 (H) 65 - 99 mg/dL   BUN 26 (H) 6 - 20 mg/dL   Creatinine, Ser 1.28 (H) 0.44 - 1.00 mg/dL   Calcium 8.4 (L) 8.9 - 10.3 mg/dL   Total Protein 6.9 6.5 - 8.1 g/dL   Albumin 2.6 (L) 3.5 - 5.0 g/dL   AST 20 15 - 41 U/L   ALT 16 14 - 54 U/L   Alkaline Phosphatase 74 38 - 126 U/L   Total Bilirubin 1.2 0.3 - 1.2 mg/dL   GFR calc non Af Amer 39 (L) >60 mL/min   GFR calc Af Amer 45 (L) >60 mL/min    Comment: (NOTE) The eGFR has been calculated using the CKD EPI equation. This calculation has not been validated in all clinical situations. eGFR's persistently <60 mL/min signify  possible Chronic Kidney Disease.    Anion gap 18 (H) 5 - 15  CBC WITH DIFFERENTIAL     Status: Abnormal   Collection Time: 05/16/15  7:47 PM  Result Value Ref Range   WBC 18.1 (H) 4.0 - 10.5 K/uL   RBC 3.37 (L) 3.87 - 5.11 MIL/uL   Hemoglobin 10.4 (L) 12.0 - 15.0 g/dL   HCT 31.4 (L) 36.0 - 46.0 %   MCV 93.2 78.0 - 100.0 fL   MCH 30.9 26.0 - 34.0 pg   MCHC 33.1 30.0 - 36.0 g/dL   RDW 14.2 11.5 - 15.5 %   Platelets 293 150 - 400 K/uL   Neutrophils Relative % 83 %   Lymphocytes Relative 9 %   Monocytes Relative 8 %   Eosinophils Relative 0 %   Basophils Relative 0 %   Neutro Abs 15.1 (H) 1.7 - 7.7 K/uL   Lymphs Abs 1.6 0.7 - 4.0 K/uL   Monocytes Absolute 1.4 (H) 0.1 - 1.0 K/uL   Eosinophils Absolute 0.0 0.0 - 0.7 K/uL   Basophils Absolute 0.0 0.0 - 0.1 K/uL   Smear Review MORPHOLOGY UNREMARKABLE   Blood Culture (routine x 2)     Status: None (Preliminary result)   Collection Time: 05/16/15  7:50 PM  Result Value Ref Range   Specimen Description BLOOD LEFT ANTECUBITAL    Special Requests BOTTLES DRAWN AEROBIC AND ANAEROBIC 5 ML    Culture      NO GROWTH < 24 HOURS Performed at [facility]    Report Status PENDING   Urinalysis, Routine w reflex microscopic  (not at [facility])     Status: Abnormal   Collection Time: 05/16/15 11:01 PM  Result Value Ref Range   Color, Urine YELLOW YELLOW   APPearance CLOUDY (A) CLEAR   Specific Gravity, Urine 1.022 1.005 - 1.030   pH 6.0 5.0 - 8.0   Glucose, UA NEGATIVE NEGATIVE mg/dL   Hgb urine dipstick MODERATE (A) NEGATIVE   Bilirubin Urine NEGATIVE NEGATIVE   Ketones, ur 40 (A) NEGATIVE mg/dL   Protein, ur 30 (A) NEGATIVE mg/dL   Nitrite NEGATIVE NEGATIVE   Leukocytes, UA MODERATE (A) NEGATIVE  Urine culture     Status: None   Collection Time: 05/16/15 11:01 PM  Result Value Ref Range   Specimen Description URINE, CATHETERIZED    Special Requests NONE    Culture      NO GROWTH 1 DAY Performed at [facility]    Report Status 05/18/2015 FINAL   Urine microscopic-add on     Status: Abnormal   Collection Time: 05/16/15 11:01 PM  Result Value Ref Range   Squamous Epithelial / LPF 0-5 (A) NONE SEEN   WBC, UA 6-30 0 - 5 WBC/hpf   RBC / HPF 6-30 0 - 5 RBC/hpf   Bacteria, UA FEW (A) NONE SEEN  I-Stat CG4 Lactic Acid, ED  (not at  [facility])     Status: None   Collection Time: 05/16/15 11:13 PM  Result Value Ref Range   Lactic Acid, Venous 0.73 0.5 - 2.0 mmol/L  Procalcitonin     Status: None   Collection Time: 05/17/15  1:57 AM  Result Value Ref Range   Procalcitonin 22.34 ng/mL    Comment:        Interpretation: PCT >= 10 ng/mL: Important systemic inflammatory response, almost exclusively due to severe bacterial sepsis or septic shock. (NOTE)         ICU PCT Algorithm  Non ICU PCT Algorithm    ----------------------------     ------------------------------         PCT < 0.25 ng/mL                 PCT < 0.1 ng/mL     Stopping of antibiotics            Stopping of antibiotics       strongly encouraged.               strongly encouraged.    ----------------------------     ------------------------------       PCT level decrease by               PCT < 0.25 ng/mL       >= 80% from  peak PCT       OR PCT 0.25 - 0.5 ng/mL          Stopping of antibiotics                                             encouraged.     Stopping of antibiotics           encouraged.    ----------------------------     ------------------------------       PCT level decrease by              PCT >= 0.25 ng/mL       < 80% from peak PCT        AND PCT >= 0.5 ng/mL             Continuing antibiotics                                              encouraged.       Continuing antibiotics            encouraged.    ----------------------------     ------------------------------     PCT level increase compared          PCT > 0.5 ng/mL         with peak PCT AND          PCT >= 0.5 ng/mL             Escalation of antibiotics                                          strongly encouraged.      Escalation of antibiotics        strongly encouraged.   Protime-INR     Status: None   Collection Time: 05/17/15  1:57 AM  Result Value Ref Range   Prothrombin Time 15.0 11.6 - 15.2 seconds   INR 1.16 0.00 - 1.49  APTT     Status: None   Collection Time: 05/17/15  1:57 AM  Result Value Ref Range   aPTT 29 24 - 37 seconds  Basic metabolic panel     Status: Abnormal   Collection Time: 05/17/15  1:57 AM  Result Value Ref Range   Sodium 140 135 - 145 mmol/L   Potassium 3.8 3.5 - 5.1  mmol/L   Chloride 107 101 - 111 mmol/L   CO2 20 (L) 22 - 32 mmol/L   Glucose, Bld 83 65 - 99 mg/dL   BUN 23 (H) 6 - 20 mg/dL   Creatinine, Ser 1.15 (H) 0.44 - 1.00 mg/dL   Calcium 8.1 (L) 8.9 - 10.3 mg/dL   GFR calc non Af Amer 44 (L) >60 mL/min   GFR calc Af Amer 51 (L) >60 mL/min    Comment: (NOTE) The eGFR has been calculated using the CKD EPI equation. This calculation has not been validated in all clinical situations. eGFR's persistently <60 mL/min signify possible Chronic Kidney Disease.    Anion gap 13 5 - 15  MRSA PCR Screening     Status: None   Collection Time: 05/17/15  2:30 AM  Result Value Ref Range   MRSA by  PCR NEGATIVE NEGATIVE    Comment:        The GeneXpert MRSA Assay (FDA approved for NASAL specimens only), is one component of a comprehensive MRSA colonization surveillance program. It is not intended to diagnose MRSA infection nor to guide or monitor treatment for MRSA infections.   Glucose, capillary     Status: Abnormal   Collection Time: 05/17/15  7:49 AM  Result Value Ref Range   Glucose-Capillary 158 (H) 65 - 99 mg/dL  Glucose, capillary     Status: Abnormal   Collection Time: 05/17/15 12:15 PM  Result Value Ref Range   Glucose-Capillary 147 (H) 65 - 99 mg/dL  Glucose, capillary     Status: Abnormal   Collection Time: 05/17/15  5:26 PM  Result Value Ref Range   Glucose-Capillary 139 (H) 65 - 99 mg/dL  Glucose, capillary     Status: Abnormal   Collection Time: 05/17/15  9:06 PM  Result Value Ref Range   Glucose-Capillary 172 (H) 65 - 99 mg/dL  CBC     Status: Abnormal   Collection Time: 05/18/15  5:31 AM  Result Value Ref Range   WBC 19.3 (H) 4.0 - 10.5 K/uL   RBC 3.24 (L) 3.87 - 5.11 MIL/uL   Hemoglobin 9.7 (L) 12.0 - 15.0 g/dL   HCT 29.7 (L) 36.0 - 46.0 %   MCV 91.7 78.0 - 100.0 fL   MCH 29.9 26.0 - 34.0 pg   MCHC 32.7 30.0 - 36.0 g/dL   RDW 13.8 11.5 - 15.5 %   Platelets 317 150 - 400 K/uL  Basic metabolic panel     Status: Abnormal   Collection Time: 05/18/15  5:31 AM  Result Value Ref Range   Sodium 137 135 - 145 mmol/L   Potassium 4.1 3.5 - 5.1 mmol/L   Chloride 104 101 - 111 mmol/L   CO2 24 22 - 32 mmol/L   Glucose, Bld 119 (H) 65 - 99 mg/dL   BUN 13 6 - 20 mg/dL   Creatinine, Ser 0.96 0.44 - 1.00 mg/dL   Calcium 8.2 (L) 8.9 - 10.3 mg/dL   GFR calc non Af Amer 55 (L) >60 mL/min   GFR calc Af Amer >60 >60 mL/min    Comment: (NOTE) The eGFR has been calculated using the CKD EPI equation. This calculation has not been validated in all clinical situations. eGFR's persistently <60 mL/min signify possible Chronic Kidney Disease.    Anion gap 9 5 - 15   Glucose, capillary     Status: Abnormal   Collection Time: 05/18/15  7:42 AM  Result Value Ref Range   Glucose-Capillary 184 (  H) 65 - 99 mg/dL  Glucose, capillary     Status: None   Collection Time: 05/18/15 11:57 AM  Result Value Ref Range   Glucose-Capillary 91 65 - 99 mg/dL  Lactic acid, plasma     Status: None   Collection Time: 05/18/15 12:03 PM  Result Value Ref Range   Lactic Acid, Venous 0.7 0.5 - 2.0 mmol/L   Recent Results (from the past 240 hour(s))  Blood Culture (routine x 2)     Status: None (Preliminary result)   Collection Time: 05/16/15  7:44 PM  Result Value Ref Range Status   Specimen Description BLOOD LEFT HAND  Final   Special Requests BOTTLES DRAWN AEROBIC AND ANAEROBIC 5 ML  Final   Culture   Final    NO GROWTH < 24 HOURS Performed at [facility]    Report Status PENDING  Incomplete  Blood Culture (routine x 2)     Status: None (Preliminary result)   Collection Time: 05/16/15  7:50 PM  Result Value Ref Range Status   Specimen Description BLOOD LEFT ANTECUBITAL  Final   Special Requests BOTTLES DRAWN AEROBIC AND ANAEROBIC 5 ML  Final   Culture   Final    NO GROWTH < 24 HOURS Performed at [facility]    Report Status PENDING  Incomplete  Urine culture     Status: None   Collection Time: 05/16/15 11:01 PM  Result Value Ref Range Status   Specimen Description URINE, CATHETERIZED  Final   Special Requests NONE  Final   Culture   Final    NO GROWTH 1 DAY Performed at [facility]    Report Status 05/18/2015 FINAL  Final  MRSA PCR Screening     Status: None   Collection Time: 05/17/15  2:30 AM  Result Value Ref Range Status   MRSA by PCR NEGATIVE NEGATIVE Final    Comment:        The GeneXpert MRSA Assay (FDA approved for NASAL specimens only), is one component of a comprehensive MRSA colonization surveillance program. It is not intended to diagnose MRSA infection nor to guide or monitor treatment for MRSA infections.     Creatinine:  Recent Labs  05/16/15 1947 05/17/15 0157 05/18/15 0531  CREATININE 1.28* 1.15* 0.96   Baseline Creatinine:   CLINICAL DATA: Urinary tract infection. Fevers. Abdominal tenderness. Elevated white blood cell count. C. difficile. Hysterectomy.  EXAM: CT ABDOMEN AND PELVIS WITH CONTRAST  TECHNIQUE: Multidetector CT imaging of the abdomen and pelvis was performed using the standard protocol following bolus administration of intravenous contrast.  CONTRAST: 100 cc of Isovue-300  COMPARISON: 11/01/2003  FINDINGS: Lower chest: Minimal motion degradation. Left base atelectasis. Lipomatous hypertrophy of the interatrial septum. Normal heart size. Trace left pleural fluid. Small hiatal hernia. Minimal enlargement of right cardiophrenic angle nodes which are up to 8 mm on image 10/series 2. Likely reactive.  Hepatobiliary: Development of moderate cirrhosis with probable mild hepatic steatosis. No focal liver lesion. Motion degradation continuing into the upper abdomen. Evaluation also degraded by beam hardening artifact from lumbar spine fixation hardware.  Normal gallbladder, without biliary ductal dilatation.  Pancreas: Normal, without mass or ductal dilatation.  Spleen: Normal in size, without focal abnormality.  Adrenals/Urinary Tract: Normal adrenal glands. Right renal collecting system stones, including a 5 mm stone in the right extrarenal pelvis.  Bilateral too small to characterize renal lesions. An interpolar left renal cyst or minimally complex cyst at 10 mm.  Upper pole  left renal lesion measures fluid density and 2.1 cm, consistent with a cyst.  Multiple left-sided renal collecting system calculi, including multiple stones within the left renal pelvis. Moderate left-sided hydroureteronephrosis with decreased renal function, as evidenced by lack of contrast excretion on delayed images. Moderate left hydroureter continues to the  level of a distal left ureteric stone which measures 11x13 mm on image 74/series 2.  Mild bladder wall irregularity with a small left-sided diverticulum identified.  Stomach/Bowel: Proximal gastric underdistention. Gastric antrum is also underdistended. Apparent wall thickening could be secondary. Example image 29/series 2.  Rectal catheter in place. Moderate rectal wall thickening, including on image 81/series 2. Scattered colonic diverticula. Normal terminal ileum and appendix. Normal small bowel.  Vascular/Lymphatic: Advanced aortic and branch vessel atherosclerosis. Hepatic and portal veins patent. No specific findings of portal venous hypertension. No splenic artery aneurysm. Prominent retroperitoneal nodes are not pathologic by size criteria and likely reactive. No pelvic sidewall adenopathy.  Reproductive: Hysterectomy. No adnexal mass.  Other: No significant free fluid. Soft tissue density at the origin of the right inguinal canal could be postoperative and was present on the prior exam. Moderate pelvic floor laxity. Small volume abdominal ascites.  Musculoskeletal: Skin irregularity about the coccyx, including on image 77/series 2. Bilateral hip osteoarthritis. Proximal left femoral fixation. No gross osseous destruction about the coccyx. Osteopenia. Lumbar spine fixation.  IMPRESSION: 1. Motion and hardware degraded images. 2. Moderate left-sided hydroureteronephrosis secondary to a distal left ureteric 11 x 13 mm calculus. Decreased left-sided renal function. 3. Large burden bilateral renal collecting system stones, including stones within both extrarenal pelves. 4. Cirrhosis with small volume ascites. 5. Proctitis, likely related to C. difficile colitis, given the clinical history. 6. Bladder wall irregularity which could relate to cystitis. Bladder diverticulum, suggesting a component of outlet obstruction. 7. Skin irregularity superficial to the  coccyx. Recommend physical exam correlation to exclude decubitus ulcer. No gross osseous destruction. These results will be called to the ordering clinician or representative by the Radiologist Assistant, and communication documented in the PACS or zVision Dashboard.   Electronically Signed  By: Abigail Miyamoto M.D.  On: 05/18/2015 15:57            I  05/18/2015, 5:20 PM

## 2015-05-18 NOTE — Progress Notes (Signed)
Follow up note  Ana Newman had a CT scan of abdomen and pelvis performed today for further workup of sepsis, she was found to have moderate left-sided hydronephrosis with a distal left ureter 11.13 mm calculus. Radiology reported large burden bilateral renal collecting systems stones. Will consult urology.

## 2015-05-18 NOTE — Anesthesia Postprocedure Evaluation (Signed)
Anesthesia Post Note  Patient: Ana Newman  Procedure(s) Performed: Procedure(s) (LRB): CYSTOSCOPY WITH RETROGRADE PYELOGRAM/URETERAL STENT PLACEMENT (Bilateral)  Patient location during evaluation: PACU Anesthesia Type: General Level of consciousness: sedated Pain management: satisfactory to patient Vital Signs Assessment: post-procedure vital signs reviewed and stable Respiratory status: spontaneous breathing Cardiovascular status: stable Anesthetic complications: no    Last Vitals:  Filed Vitals:   05/18/15 2130 05/18/15 2131  BP: 151/121 152/86  Pulse: 102 102  Temp:    Resp: 30 26    Last Pain:  Filed Vitals:   05/18/15 2134  PainSc: 0-No pain                 Desirey Keahey EDWARD

## 2015-05-18 NOTE — Progress Notes (Signed)
Family Contact Son: Herbie [place] Daughter: Alyse Low  [phone]

## 2015-05-18 NOTE — Progress Notes (Signed)
Utilization review completed.  

## 2015-05-18 NOTE — Interval H&P Note (Signed)
History and Physical Interval Note:  05/18/2015 7:51 PM  Ana Newman  has presented today for surgery, with the diagnosis of bilateral ureteral stones  The various methods of treatment have been discussed with the patient and family. After consideration of risks, benefits and other options for treatment, the patient has consented to  Procedure(s): CYSTOSCOPY WITH RETROGRADE PYELOGRAM/URETERAL STENT PLACEMENT (Bilateral) as a surgical intervention .  The patient's history has been reviewed, patient examined, no change in status, stable for surgery.  I have reviewed the patient's chart and labs.  Questions were answered to the patient's satisfaction.     Dyer Klug I Mckynzie Liwanag

## 2015-05-18 NOTE — Plan of Care (Signed)
Problem: Skin Integrity: Goal: Risk for impaired skin integrity will decrease Outcome: Not Progressing Patient is incontinent of urine and sacrum is red slow has stage 2 covered with allvyen.

## 2015-05-18 NOTE — Anesthesia Preprocedure Evaluation (Signed)
Anesthesia Evaluation  Patient identified by MRN, date of birth, ID band Patient awake    Reviewed: Allergy & Precautions, H&P , Patient's Chart, lab work & pertinent test results, reviewed documented beta blocker date and time   Airway Mallampati: II  TM Distance: >3 FB Neck ROM: full    Dental no notable dental hx.    Pulmonary    Pulmonary exam normal breath sounds clear to auscultation       Cardiovascular hypertension, On Medications  Rhythm:regular Rate:Normal     Neuro/Psych PSYCHIATRIC DISORDERS    GI/Hepatic   Endo/Other  diabetes, Well Controlled  Renal/GU      Musculoskeletal   Abdominal   Peds  Hematology  (+) anemia ,   Anesthesia Other Findings   Reproductive/Obstetrics                             Anesthesia Physical Anesthesia Plan  ASA: II  Anesthesia Plan:    Post-op Pain Management:    Induction: Intravenous  Airway Management Planned: LMA  Additional Equipment:   Intra-op Plan:   Post-operative Plan:   Informed Consent: I have reviewed the patients History and Physical, chart, labs and discussed the procedure including the risks, benefits and alternatives for the proposed anesthesia with the patient or authorized representative who has indicated his/her understanding and acceptance.   Dental Advisory Given and Dental advisory given  Plan Discussed with: CRNA and Surgeon  Anesthesia Plan Comments: (Discussed GA with LMA, possible sore throat, potential need to switch to ETT, N/V, pulmonary aspiration. Questions answered. )        Anesthesia Quick Evaluation

## 2015-05-18 NOTE — Transfer of Care (Signed)
Immediate Anesthesia Transfer of Care Note  Patient: Ana Newman  Procedure(s) Performed: Procedure(s): CYSTOSCOPY WITH RETROGRADE PYELOGRAM/URETERAL STENT PLACEMENT (Bilateral)  Patient Location: PACU  Anesthesia Type:General  Level of Consciousness:  sedated, patient cooperative and responds to stimulation  Airway & Oxygen Therapy:Patient Spontanous Breathing and Patient connected to face mask oxgen  Post-op Assessment:  Report given to PACU RN and Post -op Vital signs reviewed and stable  Post vital signs:  Reviewed and stable  Last Vitals:  Filed Vitals:   05/18/15 0530 05/18/15 1629  BP: 156/82 135/56  Pulse: 106 78  Temp: 37.1 C 37 C  Resp: 18 20    Complications: No apparent anesthesia complications

## 2015-05-18 NOTE — Op Note (Signed)
Pre-operative diagnosis :   Bilateral obstructing stones, including: (a) 75mm Left lower ureteral stone; 18.7x8.22mm Left UPJ stone; with associated hydronephrosis and renal scarring; multiple 3-26mm Left renal cortical stones; and right 8 mm UPJ stone, hydronephrosis, with renal scarring.  Postoperative diagnosis:  Same  Operation:  Cystoscopy, bilateral retrograde pyelograms with interpretation, bilateral JJ stent placement ( 67F x 24cm).   Findings: large volume pus, Left renal pelvis, drained.  Surgeon:  Chauncey Cruel. Gaynelle Arabian, MD.   First assistant:  None  Anesthesia:  GET  Preparation:  After appropriate preanesthesia, the patient was brought the operative room, placed on the operating table in the dorsal supine position where general endotracheal anesthesia was introduced. She was then replaced in the dorsal lithotomy position with the pubis was prepped with Betadine solution and draped in usual fashion. It is noted that the patient had a previous anterior hip surgery, and she had a very difficult placement of her left leg. The knee would only slightly flexed. The hip would not flex at all. Care was taken to avoid injury to the left lower extremity. The right lower cavity was stiff, but would allow bending to be placed in the yellowfin. Both legs were protected to avoid any peripheral nerve injury.  The arm and was double checked. The CT scan was double checked.  Review history:  Impression/Assessment: CT review shows bilateral obstructing stones, including: (a)_ 27mm Left lower ureteral stone; 18.7x8.28mm Left UPJ stone; with associated hydronephrosis and renal scarring; multiple 3-70mm Left renal cortical stones; right 8 mm UPJ stone, hydronephrosis.  In addition, she has:  1. Sepsis -Present on admission, evidenced by temperature of 103.4, heart rate of 116, respiratory rate of 31, white count of 18,100 -Suspect source of infection to be urinary tract infection and possible C. difficile colitis  given diarrhea and recent antimicrobial use. -She was started on cefepime IV -Continue IV fluids, supportive care, C. difficile pending, blood cultures and urine cultures pending  2. Diarrhea. -She was recently diagnosed with urinary tract infection at her facility and was treated with ceftriaxone IM. -He comes in having multiple episodes of diarrhea -Stool for C. difficile is currently pending.   3. Acute kidney injury -Labs showing creatinine of 1.28 with BUN of 26, previously 0.8 and 20 on 02/27/2014  4. Insulin dependent diabetes mellitus -Blood are stable, continue Lantus 17 units subcutaneous twice a day   Statement of  Likelihood of Success: Excellent. TIME-OUT observed.:  Procedure:  Cystourethroscopy was accomplished, and showed a normal-appearing bladder base with no evidence of bladder stone, tumor, or diverticular formation. Edema was noted trigone, it was difficult to identify the ureteral orifices. However, I finally was able to identify the left ureteral orifice. Using a Glidewire, I was able to cannulate the left ureteral orifice. Left retro-progress performed, which showed a large volume of stone in the left ureterovesical junction, and marked hydronephrosis proximal to that point all way to the left renal pelvis. A 0.038 Glidewire was then passed with difficulty through the left ureteral orifice, and around the stone, and through the ureter, into the left renal pelvis and coiled. A 6 French by 24 cm double-J stent without the attachment was then passed through the ureter, and coiled in the left renal pelvis under fluoroscopic control, and coiled in the bladder base, under direct vision.  The right ureteral orifice was identified, and was noted to be difficult to identify, and was found by using the Glidewire at the in of the open-ended catheter. Once identified,  the open ended catheter was inserted into the right ureteral orifice. Retrograde pyelogram revealed a  hydronephrotic and tortuous right ureter. No definite stone could be identified. The Glidewire was then passed with difficulty around multiple tortuosities in the mid and upper ureter, until the guidewire was coiled in the left renal pelvis. This was accomplished under fluoroscopic control with contrast in the renal pelvis as a guarantee.  A 6 French by 24 cm left double-J stent was then passed over the Glidewire, into the renal pelvis. The double-J stent was coiled in the renal pelvis, and in the bladder. X-ray was used to identify the coil in the renal pelvis, and direct vision was used to identify the coil in the bladder. The suture attachment was removed. The bladder was drained of fluid, and an 18 [place] Foley catheter was placed with 10 mL balloon. Photodocumentation of the copious pus drainage from the left renal pelvis through the double-J stent was accomplished. The patient was awakened and taken to recovery room in good condition.

## 2015-05-19 ENCOUNTER — Encounter (HOSPITAL_COMMUNITY): Payer: Self-pay | Admitting: Urology

## 2015-05-19 DIAGNOSIS — N10 Acute tubulo-interstitial nephritis: Secondary | ICD-10-CM

## 2015-05-19 LAB — GLUCOSE, CAPILLARY
GLUCOSE-CAPILLARY: 146 mg/dL — AB (ref 65–99)
GLUCOSE-CAPILLARY: 172 mg/dL — AB (ref 65–99)
GLUCOSE-CAPILLARY: 63 mg/dL — AB (ref 65–99)
GLUCOSE-CAPILLARY: 87 mg/dL (ref 65–99)
GLUCOSE-CAPILLARY: 116 mg/dL — AB (ref 65–99)
Glucose-Capillary: 54 mg/dL — ABNORMAL LOW (ref 65–99)
Glucose-Capillary: 65 mg/dL (ref 65–99)

## 2015-05-19 LAB — BASIC METABOLIC PANEL
Anion gap: 8 (ref 5–15)
BUN: 13 mg/dL (ref 6–20)
CALCIUM: 7.9 mg/dL — AB (ref 8.9–10.3)
CO2: 25 mmol/L (ref 22–32)
CREATININE: 1.01 mg/dL — AB (ref 0.44–1.00)
Chloride: 104 mmol/L (ref 101–111)
GFR calc non Af Amer: 52 mL/min — ABNORMAL LOW (ref >60)
GLUCOSE: 58 mg/dL — AB (ref 65–99)
Potassium: 4.3 mmol/L (ref 3.5–5.1)
Sodium: 137 mmol/L (ref 135–145)

## 2015-05-19 LAB — CBC
HEMATOCRIT: 27.3 % — AB (ref 36.0–46.0)
Hemoglobin: 9 g/dL — ABNORMAL LOW (ref 12.0–15.0)
MCH: 31.5 pg (ref 26.0–34.0)
MCHC: 33 g/dL (ref 30.0–36.0)
MCV: 95.5 fL (ref 78.0–100.0)
Platelets: 309 10*3/uL (ref 150–400)
RBC: 2.86 MIL/uL — ABNORMAL LOW (ref 3.87–5.11)
RDW: 14.6 % (ref 11.5–15.5)
WBC: 20.2 10*3/uL — ABNORMAL HIGH (ref 4.0–10.5)

## 2015-05-19 LAB — C DIFFICILE QUICK SCREEN W PCR REFLEX
C Diff antigen: NEGATIVE
C Diff interpretation: NEGATIVE
C Diff toxin: NEGATIVE

## 2015-05-19 LAB — HEMOGLOBIN A1C
Hgb A1c MFr Bld: 7 % — ABNORMAL HIGH (ref 4.8–5.6)
Mean Plasma Glucose: 154 mg/dL

## 2015-05-19 NOTE — Progress Notes (Signed)
Hypoglycemic Event  CBG: 56  Treatment: 15 GM carbohydrate snack  Symptoms: Hungry  Follow-up CBG: Time: [id number] CBG Result: 65, 146 at 2nd recheck  Possible Reasons for Event: Inadequate meal intake- NPO  Comments/MD notified: [place]

## 2015-05-19 NOTE — Progress Notes (Signed)
Pharmacy Antibiotic Note  Ana Newman is a 80 y.o. female admitted on 05/16/2015 with UTI.  Per report, patient has received two days of Rocephin at [place].  Pharmacy has been consulted for cefepime dosing.  Plan:  Continue Cefepime 2g IV q24h  Cultures negative so far.  WBC remaining elevated.  Narrow when able.  Height: 5' (152.4 cm) Weight: 163 lb 2.3 oz (74 kg) IBW/kg (Calculated) : 45.5  Temp (24hrs), Avg:98.3 F (36.8 C), Min:97.8 F (36.6 C), Max:99.1 F (37.3 C)   Recent Labs Lab 05/16/15 1947 05/16/15 2313 05/17/15 0157 05/18/15 0531 05/18/15 1203 05/18/15 1849 05/19/15 0437  WBC 18.1*  --   --  19.3*  --  18.5* 20.2*  CREATININE 1.28*  --  1.15* 0.96  --  0.96 1.01*  LATICACIDVEN  --  0.73  --   --  0.7  --   --     Estimated Creatinine Clearance: 41.2 mL/min (by C-G formula based on Cr of 1.01).    No Known Allergies  Antimicrobials this admission: 4/14 >> Cefepime >>  Dose adjustments this admission: -  Microbiology results: 4/14 BCx: NGTD 4/14 UCx: NGF  4/15 Cdiff PCR: neg 4/15 MRSA PCR: Neg  Thank you for allowing pharmacy to be a part of this patient's care.  Hershal Coria, PharmD, BCPS Pager: [phone] 05/19/2015 8:15 AM

## 2015-05-19 NOTE — Progress Notes (Signed)
Inpatient Diabetes Program Recommendations  AACE/ADA: New Consensus Statement on Inpatient Glycemic Control (2015)  Target Ranges:  Prepandial:   less than 140 mg/dL      Peak postprandial:   less than 180 mg/dL (1-2 hours)      Critically ill patients:  140 - 180 mg/dL   Review of Glycemic Control  Results for Ana Newman, Ana Newman (MRN [id number]) as of 05/19/2015 13:09  Ref. Range 05/18/2015 17:28 05/18/2015 18:50 05/18/2015 21:13 05/19/2015 08:26 05/19/2015 08:41 05/19/2015 10:08 05/19/2015 12:00  Glucose-Capillary Latest Ref Range: 65-99 mg/dL 63 (L) 76 64 (L) 54 (L) 65 146 (H) 172 (H)   Hypoglycemia.  Inpatient Diabetes Program Recommendations:    Decrease Lantus to 12 units bid.  Will continue to follow. Thank you. Lorenda Peck, RD, LDN, CDE Inpatient Diabetes Coordinator [phone]

## 2015-05-19 NOTE — Progress Notes (Signed)
[facility] PROGRESS NOTE  XITLALLI LAMOUR [id number] DOB: Mar 02, 1935 DOA: 05/16/2015 PCP: Hennie Duos, MD  Assessment/Plan: 1. Sepsis -Present on admission, evidenced by temperature of 103.4, heart rate of 116, respiratory rate of 31, white count of 18,100 -Suspect source of infection to be urinary tract infection and possible C. difficile colitis given diarrhea and recent antimicrobial use. -She was started on cefepime IV -On 05/18/2015 she complained of feeling ill having abdominal pain with palpation on physical exam which was a new finding. I repeat a lactic acid which came back normal at 0.7. Overnight she had spiked another temperature of 101.4. Labs showing a white count of 19,300. Ordered a CT scan with IV contrast given abdominal symptoms. -On 05/03/2015 she had a CT scan of abdomen and pelvis with IV contrast, study revealed bilateral obstructive uropathy. Radios reporting moderate left-sided hydroureteronephrosis with large burden bilateral renal collecting systems stones. -Given CT findings urology was consulted as she was taken to the operating room on the evening of 05/18/2015 where she underwent cystoscopy with placement of bilateral stents. She tolerated procedure well there no immediate complications. -On [id number] patient showing clinical improvement.  2.  Pyelonephritis -CT scan performed on 05/18/2015 showing bilateral obstructing renal calculi. -On 05/18/2015 status post cystoscopy with stent placement. -Urine culture showing no growth, however, had been given antimicrobial therapy prior to this hospitalization. -Will continue IV antibiotic therapy with cefepime 2 g IV every 24 hours  2.  Diarrhea. -She was recently diagnosed with urinary tract infection at her facility and was treated with ceftriaxone IM. -He comes in having multiple episodes of diarrhea -Stool for C. difficile negative, diarrhea improved.  3.  Acute kidney injury -Labs showing  creatinine of 1.28 with BUN of 26, previously 0.8 and 20 on 02/27/2014 -Renal function stable  4.  Insulin dependent diabetes mellitus -Blood are stable, continue Lantus 17 units subcutaneous twice a day  Code Status: full code Family Communication: Case discussed with family members who were updated Disposition Plan: continue IV antibiotic therapy  Antibiotics:  Cefepime  HPI/Subjective: Mrs Piccione is a 80 year old female with past medical history of common of impairment, diabetes mellitus, currently resident at a skilled nursing facility, recently diagnosed with urinary tract infection and started on IM Rocephin. Slight this intervention she continued to have fevers. As on presentation showed a white count of 18,100 with a creatinine 1.28 and BUN of 26. She was started on broad-spectrum IV antimicrobial therapy with cefepime.  Objective: Filed Vitals:   05/19/15 0355 05/19/15 1400  BP: 126/51 122/56  Pulse: 86 78  Temp: 99.1 F (37.3 C) 98.3 F (36.8 C)  Resp: 22 20    Intake/Output Summary (Last 24 hours) at 05/19/15 1520 Last data filed at 05/19/15 1400  Gross per 24 hour  Intake    620 ml  Output    985 ml  Net   -365 ml   Filed Weights   05/17/15 0148 05/18/15 0530 05/19/15 0355  Weight: 73.9 kg (162 lb 14.7 oz) 73.2 kg (161 lb 6 oz) 74 kg (163 lb 2.3 oz)    Exam:   General:  Looks better today compared to yesterday, she is more awake and alert and states feeling better.  Cardiovascular: tachycardic, regular rate rhythm normal S1-S2 no murmurs or gallops  Respiratory: she is having normal respiratory effort no wheezing rhonchi rales  Abdomen: There is now pain to palpation over epigastric region no rebound tenderness or guarding  Musculoskeletal: having anatomical deformities to bilateral  feet, she seems to have pain with flexion of both feet no edema  Data Reviewed: Basic Metabolic Panel:  Recent Labs Lab 05/16/15 1947 05/17/15 0157 05/18/15 0531  05/18/15 1849 05/19/15 0437  NA 135 140 137 137 137  K 3.7 3.8 4.1 4.2 4.3  CL 101 107 104 106 104  CO2 16* 20* 24 26 25   GLUCOSE 108* 83 119* 84 58*  BUN 26* 23* 13 13 13   CREATININE 1.28* 1.15* 0.96 0.96 1.01*  CALCIUM 8.4* 8.1* 8.2* 7.9* 7.9*   Liver Function Tests:  Recent Labs Lab 05/16/15 1947  AST 20  ALT 16  ALKPHOS 74  BILITOT 1.2  PROT 6.9  ALBUMIN 2.6*   No results for input(s): LIPASE, AMYLASE in the last 168 hours. No results for input(s): AMMONIA in the last 168 hours. CBC:  Recent Labs Lab 05/16/15 1947 05/18/15 0531 05/18/15 1849 05/19/15 0437  WBC 18.1* 19.3* 18.5* 20.2*  NEUTROABS 15.1*  --   --   --   HGB 10.4* 9.7* 9.6* 9.0*  HCT 31.4* 29.7* 28.6* 27.3*  MCV 93.2 91.7 91.7 95.5  PLT 293 317 307 309   Cardiac Enzymes: No results for input(s): CKTOTAL, CKMB, CKMBINDEX, TROPONINI in the last 168 hours. BNP (last 3 results) No results for input(s): BNP in the last 8760 hours.  ProBNP (last 3 results) No results for input(s): PROBNP in the last 8760 hours.  CBG:  Recent Labs Lab 05/18/15 2113 05/19/15 0826 05/19/15 0841 05/19/15 1008 05/19/15 1200  GLUCAP 64* 54* 65 146* 172*    Recent Results (from the past 240 hour(s))  Blood Culture (routine x 2)     Status: None (Preliminary result)   Collection Time: 05/16/15  7:44 PM  Result Value Ref Range Status   Specimen Description BLOOD LEFT HAND  Final   Special Requests BOTTLES DRAWN AEROBIC AND ANAEROBIC 5 ML  Final   Culture   Final    NO GROWTH 2 DAYS Performed at [facility]    Report Status PENDING  Incomplete  Blood Culture (routine x 2)     Status: None (Preliminary result)   Collection Time: 05/16/15  7:50 PM  Result Value Ref Range Status   Specimen Description BLOOD LEFT ANTECUBITAL  Final   Special Requests BOTTLES DRAWN AEROBIC AND ANAEROBIC 5 ML  Final   Culture   Final    NO GROWTH 2 DAYS Performed at [facility]    Report Status PENDING   Incomplete  Urine culture     Status: None   Collection Time: 05/16/15 11:01 PM  Result Value Ref Range Status   Specimen Description URINE, CATHETERIZED  Final   Special Requests NONE  Final   Culture   Final    NO GROWTH 1 DAY Performed at [facility]    Report Status 05/18/2015 FINAL  Final  MRSA PCR Screening     Status: None   Collection Time: 05/17/15  2:30 AM  Result Value Ref Range Status   MRSA by PCR NEGATIVE NEGATIVE Final    Comment:        The GeneXpert MRSA Assay (FDA approved for NASAL specimens only), is one component of a comprehensive MRSA colonization surveillance program. It is not intended to diagnose MRSA infection nor to guide or monitor treatment for MRSA infections.   C difficile quick scan w PCR reflex     Status: None   Collection Time: 05/19/15  3:38 AM  Result Value Ref  Range Status   C Diff antigen NEGATIVE NEGATIVE Final   C Diff toxin NEGATIVE NEGATIVE Final   C Diff interpretation Negative for toxigenic C. difficile  Final     Studies: Ct Abdomen Pelvis W Contrast  05/18/2015  CLINICAL DATA:  Urinary tract infection. Fevers. Abdominal tenderness. Elevated white blood cell count. C. difficile. Hysterectomy. EXAM: CT ABDOMEN AND PELVIS WITH CONTRAST TECHNIQUE: Multidetector CT imaging of the abdomen and pelvis was performed using the standard protocol following bolus administration of intravenous contrast. CONTRAST:  100 cc of Isovue-300 COMPARISON:  11/01/2003 FINDINGS: Lower chest: Minimal motion degradation. Left base atelectasis. Lipomatous hypertrophy of the interatrial septum. Normal heart size. Trace left pleural fluid. Small hiatal hernia. Minimal enlargement of right cardiophrenic angle nodes which are up to 8 mm on image 10/series 2. Likely reactive. Hepatobiliary: Development of moderate cirrhosis with probable mild hepatic steatosis. No focal liver lesion. Motion degradation continuing into the upper abdomen. Evaluation also  degraded by beam hardening artifact from lumbar spine fixation hardware. Normal gallbladder, without biliary ductal dilatation. Pancreas: Normal, without mass or ductal dilatation. Spleen: Normal in size, without focal abnormality. Adrenals/Urinary Tract: Normal adrenal glands. Right renal collecting system stones, including a 5 mm stone in the right extrarenal pelvis. Bilateral too small to characterize renal lesions. An interpolar left renal cyst or minimally complex cyst at 10 mm. Upper pole left renal lesion measures fluid density and 2.1 cm, consistent with a cyst. Multiple left-sided renal collecting system calculi, including multiple stones within the left renal pelvis. Moderate left-sided hydroureteronephrosis with decreased renal function, as evidenced by lack of contrast excretion on delayed images. Moderate left hydroureter continues to the level of a distal left ureteric stone which measures 11x13 mm on image 74/series 2. Mild bladder wall irregularity with a small left-sided diverticulum identified. Stomach/Bowel: Proximal gastric underdistention. Gastric antrum is also underdistended. Apparent wall thickening could be secondary. Example image 29/series 2. Rectal catheter in place. Moderate rectal wall thickening, including on image 81/series 2. Scattered colonic diverticula. Normal terminal ileum and appendix. Normal small bowel. Vascular/Lymphatic: Advanced aortic and branch vessel atherosclerosis. Hepatic and portal veins patent. No specific findings of portal venous hypertension. No splenic artery aneurysm. Prominent retroperitoneal nodes are not pathologic by size criteria and likely reactive. No pelvic sidewall adenopathy. Reproductive: Hysterectomy.  No adnexal mass. Other: No significant free fluid. Soft tissue density at the origin of the right inguinal canal could be postoperative and was present on the prior exam. Moderate pelvic floor laxity. Small volume abdominal ascites. Musculoskeletal:  Skin irregularity about the coccyx, including on image 77/series 2. Bilateral hip osteoarthritis. Proximal left femoral fixation. No gross osseous destruction about the coccyx. Osteopenia. Lumbar spine fixation. IMPRESSION: 1. Motion and hardware degraded images. 2. Moderate left-sided hydroureteronephrosis secondary to a distal left ureteric 11 x 13 mm calculus. Decreased left-sided renal function. 3. Large burden bilateral renal collecting system stones, including stones within both extrarenal pelves. 4. Cirrhosis with small volume ascites. 5. Proctitis, likely related to C. difficile colitis, given the clinical history. 6. Bladder wall irregularity which could relate to cystitis. Bladder diverticulum, suggesting a component of outlet obstruction. 7. Skin irregularity superficial to the coccyx. Recommend physical exam correlation to exclude decubitus ulcer. No gross osseous destruction. These results will be called to the ordering clinician or representative by the Radiologist Assistant, and communication documented in the PACS or zVision Dashboard. Electronically Signed   By: Abigail Miyamoto M.D.   On: 05/18/2015 15:57   Dg Chest [facility]  1 View  05/18/2015  CLINICAL DATA:  Sepsis.  Nausea. EXAM: PORTABLE CHEST 1 VIEW COMPARISON:  05/16/2015. FINDINGS: Poor inspiration. Rotation to the right. Normal sized heart. Clear lungs. Mildly prominent interstitial markings. Diffuse osteopenia. IMPRESSION: No acute abnormality.  Mild chronic interstitial lung disease. Electronically Signed   By: Claudie Revering M.D.   On: 05/18/2015 10:56    Scheduled Meds: . acetaminophen  1,000 mg Oral Daily  . aspirin  325 mg Oral Daily  . atorvastatin  10 mg Oral q1800  . ceFEPime (MAXIPIME) IV  2 g Intravenous Q24H  . cholecalciferol  2,000 Units Oral Daily  . diatrizoate meglumine-sodium  30 mL Oral Once  . enoxaparin (LOVENOX) injection  40 mg Subcutaneous Q24H  . insulin aspart  0-9 Units Subcutaneous TID WC  . insulin glargine   17 Units Subcutaneous BID  . polyvinyl alcohol  1 drop Both Eyes TID  . sodium chloride flush  3 mL Intravenous Q12H   Continuous Infusions: . sodium chloride 75 mL/hr at 05/19/15 1038    Principal Problem:   Sepsis ([place]) Active Problems:   Essential hypertension, benign   Psychosis   Depression   Hypothyroidism   Diabetes mellitus, type II, insulin dependent ([place])   AKI (acute kidney injury) ([place])   UTI (lower urinary tract infection)   Nephrolithiasis    Time spent: 35 minutes   Kelvin Cellar  [facility] Pager [phone]. If 7PM-7AM, please contact night-coverage at [email or web address], password [facility] 05/19/2015, 3:20 PM  LOS: 2 days

## 2015-05-19 NOTE — Progress Notes (Signed)
CSW continuing to follow.  Pt admitted from [facility] and per report, plan is to return when stable.   Per progression meeting, pt not yet medically ready for discharge.  CSW to continue to follow.  Alison Murray, MSW, LCSW Clinical Social Work Coverage for [employer], [place]  [phone]

## 2015-05-19 NOTE — Progress Notes (Signed)
Urology Progress Note  1 Day Post-Op   Subjective:Bilateral obstructing stones, including: (a) 73mm Left lower ureteral stone; 18.7x8.63mm Left UPJ stone; with associated hydronephrosis and renal scarring; multiple 3-83mm Left renal cortical stones; and right 8 mm UPJ stone, hydronephrosis, with renal scarring. She has cognitive issues, sepsis, IDDM.     No acute urologic events overnight. Ambulation:   negative Flatus:    negative Bowel movement  negative  Pain: some relief  Objective:  Blood pressure 126/51, pulse 86, temperature 99.1 F (37.3 C), temperature source Oral, resp. rate 22, height 5' (1.524 m), weight 74 kg (163 lb 2.3 oz), SpO2 97 %.  Physical Exam:  General:  No acute distress, awake Genitourinary:   neg Foley:remains    I/O last 3 completed shifts: In: 740 [P.O.:240; I.V.:500] Out: 385 [Urine:375; Blood:10]  Recent Labs     05/18/15  1849  05/19/15  0437  HGB  9.6*  9.0*  WBC  18.5*  20.2*  PLT  307  309    Recent Labs     05/18/15  1849  05/19/15  0437  NA  137  137  K  4.2  4.3  CL  106  104  CO2  26  25  BUN  13  13  CREATININE  0.96  1.01*  CALCIUM  7.9*  7.9*  GFRNONAA  55*  52*  GFRAA  >60  >60     Recent Labs     05/17/15  0157  INR  1.16  APTT  29    CLINICAL DATA: Urinary tract infection. Fevers. Abdominal tenderness. Elevated white blood cell count. C. difficile. Hysterectomy.  EXAM: CT ABDOMEN AND PELVIS WITH CONTRAST  TECHNIQUE: Multidetector CT imaging of the abdomen and pelvis was performed using the standard protocol following bolus administration of intravenous contrast.  CONTRAST: 100 cc of Isovue-300  COMPARISON: 11/01/2003  FINDINGS: Lower chest: Minimal motion degradation. Left base atelectasis. Lipomatous hypertrophy of the interatrial septum. Normal heart size. Trace left pleural fluid. Small hiatal hernia. Minimal enlargement of right cardiophrenic angle nodes which are up to 8 mm on  image 10/series 2. Likely reactive.  Hepatobiliary: Development of moderate cirrhosis with probable mild hepatic steatosis. No focal liver lesion. Motion degradation continuing into the upper abdomen. Evaluation also degraded by beam hardening artifact from lumbar spine fixation hardware.  Normal gallbladder, without biliary ductal dilatation.  Pancreas: Normal, without mass or ductal dilatation.  Spleen: Normal in size, without focal abnormality.  Adrenals/Urinary Tract: Normal adrenal glands. Right renal collecting system stones, including a 5 mm stone in the right extrarenal pelvis.  Bilateral too small to characterize renal lesions. An interpolar left renal cyst or minimally complex cyst at 10 mm.  Upper pole left renal lesion measures fluid density and 2.1 cm, consistent with a cyst.  Multiple left-sided renal collecting system calculi, including multiple stones within the left renal pelvis. Moderate left-sided hydroureteronephrosis with decreased renal function, as evidenced by lack of contrast excretion on delayed images. Moderate left hydroureter continues to the level of a distal left ureteric stone which measures 11x13 mm on image 74/series 2.  Mild bladder wall irregularity with a small left-sided diverticulum identified.  Stomach/Bowel: Proximal gastric underdistention. Gastric antrum is also underdistended. Apparent wall thickening could be secondary. Example image 29/series 2.  Rectal catheter in place. Moderate rectal wall thickening, including on image 81/series 2. Scattered colonic diverticula. Normal terminal ileum and appendix. Normal small bowel.  Vascular/Lymphatic: Advanced aortic and branch vessel atherosclerosis. Hepatic  and portal veins patent. No specific findings of portal venous hypertension. No splenic artery aneurysm. Prominent retroperitoneal nodes are not pathologic by size criteria and likely reactive. No pelvic sidewall  adenopathy.  Reproductive: Hysterectomy. No adnexal mass.  Other: No significant free fluid. Soft tissue density at the origin of the right inguinal canal could be postoperative and was present on the prior exam. Moderate pelvic floor laxity. Small volume abdominal ascites.  Musculoskeletal: Skin irregularity about the coccyx, including on image 77/series 2. Bilateral hip osteoarthritis. Proximal left femoral fixation. No gross osseous destruction about the coccyx. Osteopenia. Lumbar spine fixation.  IMPRESSION: 1. Motion and hardware degraded images. 2. Moderate left-sided hydroureteronephrosis secondary to a distal left ureteric 11 x 13 mm calculus. Decreased left-sided renal function. 3. Large burden bilateral renal collecting system stones, including stones within both extrarenal pelves. 4. Cirrhosis with small volume ascites. 5. Proctitis, likely related to C. difficile colitis, given the clinical history. 6. Bladder wall irregularity which could relate to cystitis. Bladder diverticulum, suggesting a component of outlet obstruction. 7. Skin irregularity superficial to the coccyx. Recommend physical exam correlation to exclude decubitus ulcer. No gross osseous destruction. These results will be called to the ordering clinician or representative by the Radiologist Assistant, and communication documented in the PACS or zVision Dashboard.   Electronically Signed  By: Abigail Miyamoto M.D.  On: 05/18/2015 15:57  Assessment/Plan: Bilateral JJ stents. Will need continued antibiotic for urosepsis. JJ  For bilateral stoen obstruction. She will need eventual stoen protocol CT to evaluate location of stones and possible stone surgery in 3+ weeks.  Follow

## 2015-05-19 NOTE — Progress Notes (Signed)
Initial Nutrition Assessment  DOCUMENTATION CODES:   Not applicable  INTERVENTION:  - Continue Carb Modified diet - RD will continue to monitor for additional nutrition-related needs  NUTRITION DIAGNOSIS:   Inadequate oral intake related to lethargy/confusion, other (see comment) (recent diet changes) as evidenced by other (see comment) (hx of Alzheimer's dementia, recent diet advancement).  GOAL:   Patient will meet greater than or equal to 90% of their needs  MONITOR:   PO intake, Weight trends, Labs, Skin, I & O's  REASON FOR ASSESSMENT:   Low Braden  ASSESSMENT:   80 y.o. female with PMH of dementia, insulin-dependent diabetes mellitus, hypertension, and hyperlipidemia who presents from her nursing facility with fevers and lower abdominal pain. Patient was diagnosed with a urinary tract infection 2 days ago and started on Rocephin IM at her nursing facility. Despite this treatment, her condition is continued to worsen with daily fevers and persistent lower abdominal pain. Patient does not have a urinary catheter and is not known to have recurrent UTIs. There has been no vomiting noted that the patient has had diarrhea. She typically has chronic constipation for which she takes daily laxatives and stool softeners. History is limited given the patient's dementia.  Pt seen for low Braden. BMI indicates obesity. No intakes documented since admission. Per chart review, diet as follows:  4/14 @ 1916: NPO 4/15 @ 0115: Carb Modified 4/16 @ [id number]: NPO 4/17 @ 0845: Carb Modified  Pt with hx of Alzheimer's dementia and was sleeping at time of RD visit. No family/visitors present at this time to provide information from PTA. Physical assessment not done as to not startle pt with dementia while she was sleeping; no visual muscle or fat wasting. Per chart review, pt has lost 2 lbs (1.2% body weight) in the past 2 months which is not significant for time frame.   Notes indicate that pt to  return to [facility] once she is medically stable for d/c. DM Coordinator saw pt earlier today. Will monitor for intakes and needs related to this and will also monitor for more information concerning staging to buttocks wound and adjust estimated nutrition needs if warranted.   Not meeting needs at this time. Medications reviewed; 2000 units Vitamin D/day. Labs reviewed; CBGs: 54-172 mg/dL, creatinine elevated, Ca: 7.9 mg/dL, GFR: 52.   Diet Order:  Diet Carb Modified Fluid consistency:: Thin; Room service appropriate?: Yes  Skin:  Wound (see comment) (Wound to buttocks)  Last BM:  4/16  Height:   Ht Readings from Last 1 Encounters:  05/17/15 5' (1.524 m)    Weight:   Wt Readings from Last 1 Encounters:  05/19/15 163 lb 2.3 oz (74 kg)    Ideal Body Weight:  45.45 kg (kg)  BMI:  Body mass index is 31.86 kg/(m^2).  Estimated Nutritional Needs:   Kcal:  1330-1630 (18-22 kcal/kg)  Protein:  70-80 grams  Fluid:  >/= 1.6 L/day  EDUCATION NEEDS:   No education needs identified at this time     Jarome Matin, RD, LDN Inpatient Clinical Dietitian Pager # [phone] After hours/weekend pager # [phone]

## 2015-05-19 NOTE — Progress Notes (Signed)
Hypoglycemic Event  CBG: 63  Treatment: 15 GM carbohydrate snack  Symptoms: None  Follow-up CBG: Time: 1725 CBG Result: 87  Possible Reasons for Event: Inadequate meal intake  Comments/MD notified: No new orders    [place]

## 2015-05-20 LAB — GLUCOSE, CAPILLARY
GLUCOSE-CAPILLARY: 133 mg/dL — AB (ref 65–99)
Glucose-Capillary: 83 mg/dL (ref 65–99)
Glucose-Capillary: 139 mg/dL — ABNORMAL HIGH (ref 65–99)
Glucose-Capillary: 162 mg/dL — ABNORMAL HIGH (ref 65–99)

## 2015-05-20 MED ORDER — INSULIN GLARGINE 100 UNIT/ML ~~LOC~~ SOLN
15.0000 [IU] | Freq: Two times a day (BID) | SUBCUTANEOUS | Status: DC
  Administered 2015-05-20 – 2015-05-21 (×2): 15 [IU] via SUBCUTANEOUS
  Filled 2015-05-20 (×3): qty 0.15

## 2015-05-20 MED ORDER — CIPROFLOXACIN HCL 500 MG PO TABS
500.0000 mg | ORAL_TABLET | Freq: Two times a day (BID) | ORAL | Status: DC
  Administered 2015-05-20 – 2015-05-21 (×2): 500 mg via ORAL
  Filled 2015-05-20 (×2): qty 1

## 2015-05-20 NOTE — Clinical Documentation Improvement (Signed)
Internal Medicine  Based on the clinical findings below, please clarify if ATN ruled in or ruled out and document findings in next progress note. Thank you!   Other  Clinically Undetermined  Supporting Information:  "Suspect a prerenal azotemia secondary to sepsis; ATN is possible  - Anticipate improvement with IVF hydration  - Repeat chem panel tomorrow  - Avoid nephrotoxins; hold lisinopril until renal fxn stable" documented only in H&P   No labs with any findings of renal casts found  Please exercise your independent, professional judgment when responding. A specific answer is not anticipated or expected.  Thank You, Zoila Shutter RN, BSN, [place] [phone]; Cell: [phone]

## 2015-05-20 NOTE — Progress Notes (Signed)
[facility] PROGRESS NOTE  Ana Newman [id number] DOB: 17-Jun-1935 DOA: 05/16/2015 PCP: Hennie Duos, MD  Interim summary Ana Newman is a 80 year old female with past medical history of common of impairment, diabetes mellitus, currently resident at a skilled nursing facility, recently diagnosed with urinary tract infection and started on IM Rocephin. Slight this intervention she continued to have fevers. As on presentation showed a white count of 18,100 with a creatinine 1.28 and BUN of 26. She was started on broad-spectrum IV antimicrobial therapy with cefepime. Due to ongoing fevers, persistent elevated white count and lack of clinical improvement check CT scan of abdomen and pelvis on 05/18/2015 which revealed multiple bilateral renal calculi associated with moderate left-sided hydronephrosis. Dr. Gaynelle Arabian of urology consulted she was taken to the operating room that evening on 05/18/2015 where she underwent cystoscopy with bilateral stent placement. She tolerated procedure well there no immediate complications. On 05/20/2015 IV cefepime discontinued and was started on ciprofloxacin 500 mg by mouth twice a day. Plan to monitor for the next 24 hours, follow-up on blood cultures, anticipate discharge back to her skilled nursing facility if she remains stable.  Assessment/Plan: 1. Sepsis -Present on admission, evidenced by temperature of 103.4, heart rate of 116, respiratory rate of 31, white count of 18,100 -Suspect source of infection to be urinary tract infection and possible C. difficile colitis given diarrhea and recent antimicrobial use. -She was started on cefepime IV -On 05/18/2015 she complained of feeling ill having abdominal pain with palpation on physical exam which was a new finding. I repeat a lactic acid which came back normal at 0.7. Overnight she had spiked another temperature of 101.4. Labs showing a white count of 19,300. Ordered a CT scan with IV contrast given  abdominal symptoms. -On 05/03/2015 she had a CT scan of abdomen and pelvis with IV contrast, study revealed bilateral obstructive uropathy. Radios reporting moderate left-sided hydroureteronephrosis with large burden bilateral renal collecting systems stones. -Given CT findings urology was consulted as she was taken to the operating room on the evening of 05/18/2015 where she underwent cystoscopy with placement of bilateral stents. She tolerated procedure well there no immediate complications. -On [id number] patient showing clinical improvement. -Blood cultures drawn on 05/16/2015 showing no growth to date -On 05/20/2015: Will transition to ciprofloxacin 500 mg by mouth twice a day and discontinue cefepime. White count remains elevated, repeat labs in a.m.  2.  Pyelonephritis -CT scan performed on 05/18/2015 showing bilateral obstructing renal calculi. -On 05/18/2015 status post cystoscopy with stent placement. -Urine culture showing no growth, however, had been given antimicrobial therapy prior to this hospitalization. -She remains afebrile, will discontinue cefepime and transition to Cipro 500 mg by mouth twice a day  2.  Diarrhea. -She was recently diagnosed with urinary tract infection at her facility and was treated with ceftriaxone IM. -He comes in having multiple episodes of diarrhea -Stool for C. difficile negative, diarrhea improved.  3.  Acute kidney injury -Labs showing creatinine of 1.28 with BUN of 26, previously 0.8 and 20 on 02/27/2014 -Renal function stable  4.  Insulin dependent diabetes mellitus -She had low blood sugars this morning will decrease Lantus to 15 units subcutaneous twice a day  Code Status: full code Family Communication: Case discussed with family members who were updated Disposition Plan: Anticipate discharge to skilled nursing facility in the next 24 hours  Antibiotics:  Cefepime  HPI/Subjective: Patient is awake and alert, talkative, overall looks  much better  Objective: Filed Vitals:  05/20/15 0534 05/20/15 1420  BP: 135/61 128/54  Pulse: 92 92  Temp: 98.2 F (36.8 C) 98.7 F (37.1 C)  Resp: 20 20    Intake/Output Summary (Last 24 hours) at 05/20/15 1522 Last data filed at 05/20/15 1400  Gross per 24 hour  Intake 3878.75 ml  Output   2600 ml  Net 1278.75 ml   Filed Weights   05/18/15 0530 05/19/15 0355 05/20/15 0534  Weight: 73.2 kg (161 lb 6 oz) 74 kg (163 lb 2.3 oz) 74.9 kg (165 lb 2 oz)    Exam:   General:  Nontoxic-appearing, she is awake and alert, states feeling much better  Cardiovascular: tachycardic, regular rate rhythm normal S1-S2 no murmurs or gallops  Respiratory: she is having normal respiratory effort no wheezing rhonchi rales  Abdomen: There is interim improvement to abdominal pain  Musculoskeletal: having anatomical deformities to bilateral feet, she seems to have pain with flexion of both feet no edema  Data Reviewed: Basic Metabolic Panel:  Recent Labs Lab 05/16/15 1947 05/17/15 0157 05/18/15 0531 05/18/15 1849 05/19/15 0437  NA 135 140 137 137 137  K 3.7 3.8 4.1 4.2 4.3  CL 101 107 104 106 104  CO2 16* 20* 24 26 25   GLUCOSE 108* 83 119* 84 58*  BUN 26* 23* 13 13 13   CREATININE 1.28* 1.15* 0.96 0.96 1.01*  CALCIUM 8.4* 8.1* 8.2* 7.9* 7.9*   Liver Function Tests:  Recent Labs Lab 05/16/15 1947  AST 20  ALT 16  ALKPHOS 74  BILITOT 1.2  PROT 6.9  ALBUMIN 2.6*   No results for input(s): LIPASE, AMYLASE in the last 168 hours. No results for input(s): AMMONIA in the last 168 hours. CBC:  Recent Labs Lab 05/16/15 1947 05/18/15 0531 05/18/15 1849 05/19/15 0437  WBC 18.1* 19.3* 18.5* 20.2*  NEUTROABS 15.1*  --   --   --   HGB 10.4* 9.7* 9.6* 9.0*  HCT 31.4* 29.7* 28.6* 27.3*  MCV 93.2 91.7 91.7 95.5  PLT 293 317 307 309   Cardiac Enzymes: No results for input(s): CKTOTAL, CKMB, CKMBINDEX, TROPONINI in the last 168 hours. BNP (last 3 results) No results for  input(s): BNP in the last 8760 hours.  ProBNP (last 3 results) No results for input(s): PROBNP in the last 8760 hours.  CBG:  Recent Labs Lab 05/19/15 1703 05/19/15 1725 05/19/15 2118 05/20/15 0805 05/20/15 1159  GLUCAP 63* 87 116* 83 139*    Recent Results (from the past 240 hour(s))  Blood Culture (routine x 2)     Status: None (Preliminary result)   Collection Time: 05/16/15  7:44 PM  Result Value Ref Range Status   Specimen Description BLOOD LEFT HAND  Final   Special Requests BOTTLES DRAWN AEROBIC AND ANAEROBIC 5 ML  Final   Culture   Final    NO GROWTH 4 DAYS Performed at [facility] [place]    Report Status PENDING  Incomplete  Blood Culture (routine x 2)     Status: None (Preliminary result)   Collection Time: 05/16/15  7:50 PM  Result Value Ref Range Status   Specimen Description BLOOD LEFT ANTECUBITAL  Final   Special Requests BOTTLES DRAWN AEROBIC AND ANAEROBIC 5 ML  Final   Culture   Final    NO GROWTH 4 DAYS Performed at [facility]    Report Status PENDING  Incomplete  Urine culture     Status: None   Collection Time: 05/16/15 11:01 PM  Result Value Ref  Range Status   Specimen Description URINE, CATHETERIZED  Final   Special Requests NONE  Final   Culture   Final    NO GROWTH 1 DAY Performed at [facility]    Report Status 05/18/2015 FINAL  Final  MRSA PCR Screening     Status: None   Collection Time: 05/17/15  2:30 AM  Result Value Ref Range Status   MRSA by PCR NEGATIVE NEGATIVE Final    Comment:        The GeneXpert MRSA Assay (FDA approved for NASAL specimens only), is one component of a comprehensive MRSA colonization surveillance program. It is not intended to diagnose MRSA infection nor to guide or monitor treatment for MRSA infections.   C difficile quick scan w PCR reflex     Status: None   Collection Time: 05/19/15  3:38 AM  Result Value Ref Range Status   C Diff antigen NEGATIVE NEGATIVE Final   C Diff toxin  NEGATIVE NEGATIVE Final   C Diff interpretation Negative for toxigenic C. difficile  Final     Studies: No results found.  Scheduled Meds: . acetaminophen  1,000 mg Oral Daily  . aspirin  325 mg Oral Daily  . atorvastatin  10 mg Oral q1800  . cholecalciferol  2,000 Units Oral Daily  . ciprofloxacin  500 mg Oral BID  . diatrizoate meglumine-sodium  30 mL Oral Once  . enoxaparin (LOVENOX) injection  40 mg Subcutaneous Q24H  . insulin aspart  0-9 Units Subcutaneous TID WC  . insulin glargine  15 Units Subcutaneous BID  . polyvinyl alcohol  1 drop Both Eyes TID  . sodium chloride flush  3 mL Intravenous Q12H   Continuous Infusions:    Principal Problem:   Sepsis ([place]) Active Problems:   Essential hypertension, benign   Psychosis   Depression   Hypothyroidism   Diabetes mellitus, type II, insulin dependent ([place])   AKI (acute kidney injury) ([place])   UTI (lower urinary tract infection)   Nephrolithiasis    Time spent: 25 minutes   Kelvin Cellar  [facility] Pager [phone]. If 7PM-7AM, please contact night-coverage at [email or web address], password [facility] 05/20/2015, 3:22 PM  LOS: 3 days

## 2015-05-20 NOTE — Progress Notes (Signed)
Assessment:  Sepsis/pyelonephritis with bilateral obstructing renal calculi. , now post bilateral JJ stents. Urine c/s: NG on Cefepime.  IDDM. Dementia.   Plan: Pt will need JJ stents in for 3 weeks, then CT stone protocol and surgical planning in office.     Subjective: Patient reports feeling better. Confused.   Objective: Vital signs in last 24 hours: Temp:  [98.1 F (36.7 C)-98.3 F (36.8 C)] 98.2 F (36.8 C) (04/18 0534) Pulse Rate:  [78-92] 92 (04/18 0534) Resp:  [20] 20 (04/18 0534) BP: (122-135)/(56-61) 135/61 mmHg (04/18 0534) SpO2:  [95 %-100 %] 95 % (04/18 0534) Weight:  [74.9 kg (165 lb 2 oz)] 74.9 kg (165 lb 2 oz) (04/18 0534)A  Intake/Output from previous day: 04/17 0701 - 04/18 0700 In: 4358.8 [P.O.:960; I.V.:3298.8; IV Piggyback:100] Out: 2100 [Urine:2100] Intake/Output this shift:    Past Medical History  Diagnosis Date  . Reflux esophagitis   . Unspecified constipation   . Essential hypertension, benign   . Alzheimer's disease   . Type II or unspecified type diabetes mellitus without mention of complication, uncontrolled   . Rickets, active   . Unspecified psychosis   . Unspecified vitamin D deficiency   . Osteoarthritis     Physical Exam:  Lungs - Normal respiratory effort, chest expands symmetrically.  Abdomen - Soft, non-tender & non-distended.  Lab Results:  Recent Labs  05/18/15 0531 05/18/15 1849 05/19/15 0437  WBC 19.3* 18.5* 20.2*  HGB 9.7* 9.6* 9.0*  HCT 29.7* 28.6* 27.3*   BMET  Recent Labs  05/18/15 1849 05/19/15 0437  NA 137 137  K 4.2 4.3  CL 106 104  CO2 26 25  GLUCOSE 84 58*  BUN 13 13  CREATININE 0.96 1.01*  CALCIUM 7.9* 7.9*   No results for input(s): LABURIN in the last 72 hours. Results for orders placed or performed during the hospital encounter of 05/16/15  Blood Culture (routine x 2)     Status: None (Preliminary result)   Collection Time: 05/16/15  7:44 PM  Result Value Ref Range Status   Specimen  Description BLOOD LEFT HAND  Final   Special Requests BOTTLES DRAWN AEROBIC AND ANAEROBIC 5 ML  Final   Culture   Final    NO GROWTH 3 DAYS Performed at [facility]    Report Status PENDING  Incomplete  Blood Culture (routine x 2)     Status: None (Preliminary result)   Collection Time: 05/16/15  7:50 PM  Result Value Ref Range Status   Specimen Description BLOOD LEFT ANTECUBITAL  Final   Special Requests BOTTLES DRAWN AEROBIC AND ANAEROBIC 5 ML  Final   Culture   Final    NO GROWTH 3 DAYS Performed at [facility]    Report Status PENDING  Incomplete  Urine culture     Status: None   Collection Time: 05/16/15 11:01 PM  Result Value Ref Range Status   Specimen Description URINE, CATHETERIZED  Final   Special Requests NONE  Final   Culture   Final    NO GROWTH 1 DAY Performed at [facility]    Report Status 05/18/2015 FINAL  Final  MRSA PCR Screening     Status: None   Collection Time: 05/17/15  2:30 AM  Result Value Ref Range Status   MRSA by PCR NEGATIVE NEGATIVE Final    Comment:        The GeneXpert MRSA Assay (FDA approved for NASAL specimens only), is one component of a  comprehensive MRSA colonization surveillance program. It is not intended to diagnose MRSA infection nor to guide or monitor treatment for MRSA infections.   C difficile quick scan w PCR reflex     Status: None   Collection Time: 05/19/15  3:38 AM  Result Value Ref Range Status   C Diff antigen NEGATIVE NEGATIVE Final   C Diff toxin NEGATIVE NEGATIVE Final   C Diff interpretation Negative for toxigenic C. difficile  Final    Studies/Results: No results found.    Ana Newman I Ana Newman 05/20/2015, 8:59 AM

## 2015-05-20 NOTE — Care Management Important Message (Signed)
Important Message  Patient Details  Name: Ana Newman MRN: [id number] Date of Birth: Feb 22, 1935   Medicare Important Message Given:  Yes    Camillo Flaming 05/20/2015, 9:22 AMImportant Message  Patient Details  Name: Ana Newman MRN: [id number] Date of Birth: 05-09-1935   Medicare Important Message Given:  Yes    Camillo Flaming 05/20/2015, 9:22 AM

## 2015-05-21 DIAGNOSIS — N201 Calculus of ureter: Secondary | ICD-10-CM | POA: Diagnosis not present

## 2015-05-21 DIAGNOSIS — A419 Sepsis, unspecified organism: Principal | ICD-10-CM

## 2015-05-21 DIAGNOSIS — E875 Hyperkalemia: Secondary | ICD-10-CM | POA: Diagnosis not present

## 2015-05-21 DIAGNOSIS — R197 Diarrhea, unspecified: Secondary | ICD-10-CM | POA: Diagnosis not present

## 2015-05-21 DIAGNOSIS — N132 Hydronephrosis with renal and ureteral calculous obstruction: Secondary | ICD-10-CM | POA: Diagnosis not present

## 2015-05-21 DIAGNOSIS — I10 Essential (primary) hypertension: Secondary | ICD-10-CM

## 2015-05-21 DIAGNOSIS — E119 Type 2 diabetes mellitus without complications: Secondary | ICD-10-CM | POA: Diagnosis not present

## 2015-05-21 DIAGNOSIS — E785 Hyperlipidemia, unspecified: Secondary | ICD-10-CM | POA: Diagnosis not present

## 2015-05-21 DIAGNOSIS — N179 Acute kidney failure, unspecified: Secondary | ICD-10-CM

## 2015-05-21 DIAGNOSIS — N390 Urinary tract infection, site not specified: Secondary | ICD-10-CM

## 2015-05-21 DIAGNOSIS — E169 Disorder of pancreatic internal secretion, unspecified: Secondary | ICD-10-CM | POA: Diagnosis not present

## 2015-05-21 DIAGNOSIS — E784 Other hyperlipidemia: Secondary | ICD-10-CM | POA: Diagnosis not present

## 2015-05-21 DIAGNOSIS — N200 Calculus of kidney: Secondary | ICD-10-CM

## 2015-05-21 DIAGNOSIS — N10 Acute tubulo-interstitial nephritis: Secondary | ICD-10-CM | POA: Diagnosis not present

## 2015-05-21 DIAGNOSIS — E1165 Type 2 diabetes mellitus with hyperglycemia: Secondary | ICD-10-CM | POA: Diagnosis not present

## 2015-05-21 DIAGNOSIS — M79672 Pain in left foot: Secondary | ICD-10-CM | POA: Diagnosis not present

## 2015-05-21 DIAGNOSIS — E039 Hypothyroidism, unspecified: Secondary | ICD-10-CM

## 2015-05-21 DIAGNOSIS — M79671 Pain in right foot: Secondary | ICD-10-CM | POA: Diagnosis not present

## 2015-05-21 DIAGNOSIS — F329 Major depressive disorder, single episode, unspecified: Secondary | ICD-10-CM | POA: Diagnosis not present

## 2015-05-21 DIAGNOSIS — Z794 Long term (current) use of insulin: Secondary | ICD-10-CM

## 2015-05-21 DIAGNOSIS — B351 Tinea unguium: Secondary | ICD-10-CM | POA: Diagnosis not present

## 2015-05-21 DIAGNOSIS — F29 Unspecified psychosis not due to a substance or known physiological condition: Secondary | ICD-10-CM | POA: Diagnosis not present

## 2015-05-21 DIAGNOSIS — K219 Gastro-esophageal reflux disease without esophagitis: Secondary | ICD-10-CM | POA: Diagnosis not present

## 2015-05-21 DIAGNOSIS — F0390 Unspecified dementia without behavioral disturbance: Secondary | ICD-10-CM | POA: Diagnosis not present

## 2015-05-21 DIAGNOSIS — F39 Unspecified mood [affective] disorder: Secondary | ICD-10-CM | POA: Diagnosis not present

## 2015-05-21 DIAGNOSIS — F23 Brief psychotic disorder: Secondary | ICD-10-CM | POA: Diagnosis not present

## 2015-05-21 LAB — BASIC METABOLIC PANEL
Anion gap: 10 (ref 5–15)
BUN: 13 mg/dL (ref 6–20)
CHLORIDE: 106 mmol/L (ref 101–111)
CO2: 25 mmol/L (ref 22–32)
Calcium: 8.5 mg/dL — ABNORMAL LOW (ref 8.9–10.3)
Creatinine, Ser: 0.87 mg/dL (ref 0.44–1.00)
GFR calc Af Amer: >60 mL/min (ref >60)
GFR calc non Af Amer: >60 mL/min (ref >60)
Glucose, Bld: 118 mg/dL — ABNORMAL HIGH (ref 65–99)
POTASSIUM: 4.6 mmol/L (ref 3.5–5.1)
SODIUM: 141 mmol/L (ref 135–145)

## 2015-05-21 LAB — CBC
HEMATOCRIT: 28.3 % — AB (ref 36.0–46.0)
HEMOGLOBIN: 9.3 g/dL — AB (ref 12.0–15.0)
MCH: 31.3 pg (ref 26.0–34.0)
MCHC: 32.9 g/dL (ref 30.0–36.0)
MCV: 95.3 fL (ref 78.0–100.0)
Platelets: 433 10*3/uL — ABNORMAL HIGH (ref 150–400)
RBC: 2.97 MIL/uL — AB (ref 3.87–5.11)
RDW: 14.6 % (ref 11.5–15.5)
WBC: 13.5 10*3/uL — ABNORMAL HIGH (ref 4.0–10.5)

## 2015-05-21 LAB — CULTURE, BLOOD (ROUTINE X 2)
Culture: NO GROWTH
Culture: NO GROWTH

## 2015-05-21 LAB — GLUCOSE, CAPILLARY
GLUCOSE-CAPILLARY: 104 mg/dL — AB (ref 65–99)
GLUCOSE-CAPILLARY: 126 mg/dL — AB (ref 65–99)

## 2015-05-21 MED ORDER — CIPROFLOXACIN HCL 500 MG PO TABS: 500.0000 mg | ORAL_TABLET | Freq: Two times a day (BID) | ORAL | Status: AC

## 2015-05-21 NOTE — Discharge Summary (Signed)
Physician Discharge Summary  Ana Newman [id number] DOB: 08/31/1935 DOA: 05/16/2015  PCP: Hennie Duos, MD  Admit date: 05/16/2015 Discharge date: 05/21/2015  Time spent: 40 minutes  Recommendations for Outpatient Follow-up:  1. BMET in 5 days to follow electrolytes and renal function  2. Repeat CBC to follow WBC's trend 3. Antibiotics for 7 more days 4. Follow up with urology service for repeat CT stone protocol and definitive treatment of nephrolithiasis    Discharge Diagnoses:  Principal Problem:   Sepsis ([place]) Active Problems:   Essential hypertension, benign   Psychosis   Depression   Hypothyroidism   Diabetes mellitus, type II, insulin dependent ([place])   AKI (acute kidney injury) ([place])   UTI (lower urinary tract infection)   Nephrolithiasis   Discharge Condition: stable and improved. Will discharge back to SNF for further treatment and care. Will follow up with urology service as an outpatient.  Diet recommendation: heart healthy and modified carbohydrates diet   Filed Weights   05/19/15 0355 05/20/15 0534 05/21/15 0500  Weight: 74 kg (163 lb 2.3 oz) 74.9 kg (165 lb 2 oz) 75.9 kg (167 lb 5.3 oz)    History of present illness:  As per H&P written by Dr. Myna Hidalgo on 05/16/15 80 y.o. female with PMH of dementia, insulin-dependent diabetes mellitus, hypertension, and hyperlipidemia who presents from her nursing facility with fevers and lower abdominal pain. Patient was diagnosed with a urinary tract infection 2 days ago and started on Rocephin IM at her nursing facility. Despite this treatment, her condition is continued to worsen with daily fevers and persistent lower abdominal pain. Patient does not have a urinary catheter and is not known to have recurrent UTIs. There has been no vomiting noted that the patient has had diarrhea. She typically has chronic constipation for which she takes daily laxatives and stool softeners. History is limited given the patient's  dementia.  In ED, patient was found to be febrile to 39.7 C, tachycardic in the 110s, and with soft blood pressure. EKG features sinus tachycardia with inferior Q waves. Chest x-ray is negative for acute cardiopulmonary disease. CMP features a serum creatinine of 1.28, up from an apparent baseline of 0.8. CBC is notable for a leukocytosis to 18,100. Blood and urine cultures were obtained. Patient was given 2.5 L normal saline bolus. She was started on empiric cefepime for suspected urinary tract infection. C. difficile PCR has been ordered given the diarrhea on presentation. Patient's heart rate and blood pressure have improved with fluids and she reports feeling better after receiving Tylenol. She will be admitted to the hospital for ongoing evaluation and management of sepsis with AKI suspected secondary to UTI.   Hospital Course:  1. Sepsis: due to UTI -Present on admission, evidenced by temperature of 103.4, heart rate of 116, respiratory rate of 31, white count of 18,100 -Suspect source of infection to be urinary tract infection  -She was started on cefepime IV -On 05/03/2015 she had a CT scan of abdomen and pelvis with IV contrast, study revealed bilateral obstructive uropathy. Radiology reporting moderate left-sided hydroureteronephrosis with large burden bilateral renal collecting systems stones. -Given CT findings urology was consulted as she was taken to the operating room on the evening of 05/18/2015 where she underwent cystoscopy with placement of bilateral stents. She tolerated procedure well. -On 05/19/2015 patient showing clinical improvement and after 24 hours without fever on oral antibiotics and with essentially resolution of sepsis features will be discharge to SNF. -outpatient follow up by  urology service in 2-3 weeks.  2. Pyelonephritis -CT scan performed on 05/18/2015 showing bilateral obstructing renal calculi. -On 05/18/2015 status post cystoscopy with stent  placement. -Urine culture showing no growth, however, had been given antimicrobial therapy prior to this hospitalization. -Patient has remained afebrile, will be transitioned to Cipro 500 mg by mouth twice a day and complete 7 more days of antibiotics   2. Diarrhea. -She was recently diagnosed with urinary tract infection at her facility and was treated with ceftriaxone IM. -She comes in having multiple episodes of diarrhea -Stool for C. difficile negative, diarrhea improved/resolved at discharge -continue florastor  3. Acute kidney injury -due to UTI, continue use of ACE and decrease perfusion with SEPSIS -resolved and back to normal at discharge -ok to resume ACE inhibitors -Repeat BMET in 5 days tollow electrolytes and renal function   4. Insulin dependent diabetes mellitus -Stable overall -continue home hypoglycemic regimen   5. HTN -BP stable and rising -will resume home antihypertensive regimen  6. HLD -continue statins  Procedures:  Cystoscopy with JJ stent placement)  Consultations:  Urology (Dr. Gaynelle Arabian)  Discharge Exam: Filed Vitals:   05/21/15 0456 05/21/15 1441  BP: 165/69 148/60  Pulse: 104 97  Temp: 98.2 F (36.8 C) 98.8 F (37.1 C)  Resp: 20 20   2. General: Nontoxic-appearing, she is oriented x3, awake and alert, states feeling much better and denies any nausea, vomiting, abd pain or dysuria. 3. Cardiovascular: RRR, S1-S2 appreciated, no murmurs or gallops 4. Respiratory: she is having normal respiratory effort no wheezing rhonchi rales 5. Abdomen: positive BS, no distension, no guarding; soft. 6. Musculoskeletal: having anatomical deformities to bilateral feet, she seems to have pain with flexion of both feet no edema   Discharge Instructions   Discharge Instructions    Discharge instructions    Complete by:  As directed   Take medications as prescribed Maintain adequate hydration antibiotics to be taken for 7 more days Follow up  with Urology service (Dr. Gaynelle Arabian) as an outpatient; contact office for appointment details Heart healthy and modified carbohydrates diet          Current Discharge Medication List    START taking these medications   Details  ciprofloxacin (CIPRO) 500 MG tablet Take 1 tablet (500 mg total) by mouth 2 (two) times daily.      CONTINUE these medications which have NOT CHANGED   Details  !! acetaminophen (TYLENOL) 500 MG tablet Take 1,000 mg by mouth daily.    aspirin 325 MG tablet Take 325 mg by mouth daily. Take 1 tablet by mouth once daily.    atorvastatin (LIPITOR) 10 MG tablet Take 10 mg by mouth daily at 6 PM.    cholecalciferol (VITAMIN D) 1000 UNITS tablet Take 2,000 Units by mouth daily.    insulin glargine (LANTUS) 100 UNIT/ML injection Inject 20-25 Units into the skin 2 (two) times daily. Take 25 units in the AM and 20 units in the PM    loratadine (CLARITIN) 10 MG tablet Take 10 mg by mouth daily as needed for allergies.     metoprolol tartrate (LOPRESSOR) 25 MG tablet Take 25 mg by mouth 2 (two) times daily. For HTN    polyethylene glycol (MIRALAX / GLYCOLAX) packet Take 17 g by mouth daily. Give one 17 gram by mouth once daily for constipation.    Polyvinyl Alcohol-Povidone (FRESHKOTE) 2.7-2 % SOLN Apply 1 drop to eye 3 (three) times daily. To both eyes    senna-docusate (SENOKOT S)  8.6-50 MG per tablet Take 2 tablets by mouth at bedtime.     !! acetaminophen (TYLENOL) 325 MG tablet Take 650 mg by mouth every 6 (six) hours as needed.    bisacodyl (DULCOLAX) 5 MG EC tablet Take 10 mg by mouth daily as needed for moderate constipation.    docusate sodium (COLACE) 100 MG capsule Take 100 mg by mouth daily as needed.     lisinopril (PRINIVIL,ZESTRIL) 2.5 MG tablet Take 2 tablets (5 mg total) by mouth daily. Qty: 60 tablet, Refills: 11   Associated Diagnoses: Essential hypertension, benign     !! - Potential duplicate medications found. Please discuss with  provider.    STOP taking these medications     cefTRIAXone (ROCEPHIN) 1 g injection        No Known Allergies Follow-up Information    Follow up with Hennie Duos, MD. Schedule an appointment as soon as possible for a visit in 14 days.   Specialty:  Internal Medicine   Contact information:   [place] [id number] [phone]       Call Ailene Rud, MD.   Specialty:  Urology   Why:  office for appointment details (in approx 2-3 weeks)   Contact information:   [place] [address] [phone]       The results of significant diagnostics from this hospitalization (including imaging, microbiology, ancillary and laboratory) are listed below for reference.    Significant Diagnostic Studies: Dg Chest 2 View  05/16/2015  CLINICAL DATA:  Fever.  Sepsis. EXAM: CHEST  2 VIEW COMPARISON:  02/02/2005. FINDINGS: Normal sized heart. Clear lungs. Mild diffuse peribronchial thickening. Unremarkable bones. IMPRESSION: Stable mild chronic bronchitic changes.  No acute abnormality. Electronically Signed   By: Claudie Revering M.D.   On: 05/16/2015 20:22   Ct Abdomen Pelvis W Contrast  05/18/2015  CLINICAL DATA:  Urinary tract infection. Fevers. Abdominal tenderness. Elevated white blood cell count. C. difficile. Hysterectomy. EXAM: CT ABDOMEN AND PELVIS WITH CONTRAST TECHNIQUE: Multidetector CT imaging of the abdomen and pelvis was performed using the standard protocol following bolus administration of intravenous contrast. CONTRAST:  100 cc of Isovue-300 COMPARISON:  11/01/2003 FINDINGS: Lower chest: Minimal motion degradation. Left base atelectasis. Lipomatous hypertrophy of the interatrial septum. Normal heart size. Trace left pleural fluid. Small hiatal hernia. Minimal enlargement of right cardiophrenic angle nodes which are up to 8 mm on image 10/series 2. Likely reactive. Hepatobiliary: Development of moderate cirrhosis with probable mild hepatic  steatosis. No focal liver lesion. Motion degradation continuing into the upper abdomen. Evaluation also degraded by beam hardening artifact from lumbar spine fixation hardware. Normal gallbladder, without biliary ductal dilatation. Pancreas: Normal, without mass or ductal dilatation. Spleen: Normal in size, without focal abnormality. Adrenals/Urinary Tract: Normal adrenal glands. Right renal collecting system stones, including a 5 mm stone in the right extrarenal pelvis. Bilateral too small to characterize renal lesions. An interpolar left renal cyst or minimally complex cyst at 10 mm. Upper pole left renal lesion measures fluid density and 2.1 cm, consistent with a cyst. Multiple left-sided renal collecting system calculi, including multiple stones within the left renal pelvis. Moderate left-sided hydroureteronephrosis with decreased renal function, as evidenced by lack of contrast excretion on delayed images. Moderate left hydroureter continues to the level of a distal left ureteric stone which measures 11x13 mm on image 74/series 2. Mild bladder wall irregularity with a small left-sided diverticulum identified. Stomach/Bowel: Proximal gastric underdistention. Gastric antrum is  also underdistended. Apparent wall thickening could be secondary. Example image 29/series 2. Rectal catheter in place. Moderate rectal wall thickening, including on image 81/series 2. Scattered colonic diverticula. Normal terminal ileum and appendix. Normal small bowel. Vascular/Lymphatic: Advanced aortic and branch vessel atherosclerosis. Hepatic and portal veins patent. No specific findings of portal venous hypertension. No splenic artery aneurysm. Prominent retroperitoneal nodes are not pathologic by size criteria and likely reactive. No pelvic sidewall adenopathy. Reproductive: Hysterectomy.  No adnexal mass. Other: No significant free fluid. Soft tissue density at the origin of the right inguinal canal could be postoperative and was  present on the prior exam. Moderate pelvic floor laxity. Small volume abdominal ascites. Musculoskeletal: Skin irregularity about the coccyx, including on image 77/series 2. Bilateral hip osteoarthritis. Proximal left femoral fixation. No gross osseous destruction about the coccyx. Osteopenia. Lumbar spine fixation. IMPRESSION: 1. Motion and hardware degraded images. 2. Moderate left-sided hydroureteronephrosis secondary to a distal left ureteric 11 x 13 mm calculus. Decreased left-sided renal function. 3. Large burden bilateral renal collecting system stones, including stones within both extrarenal pelves. 4. Cirrhosis with small volume ascites. 5. Proctitis, likely related to C. difficile colitis, given the clinical history. 6. Bladder wall irregularity which could relate to cystitis. Bladder diverticulum, suggesting a component of outlet obstruction. 7. Skin irregularity superficial to the coccyx. Recommend physical exam correlation to exclude decubitus ulcer. No gross osseous destruction. These results will be called to the ordering clinician or representative by the Radiologist Assistant, and communication documented in the PACS or zVision Dashboard. Electronically Signed   By: Abigail Miyamoto M.D.   On: 05/18/2015 15:57   Dg Chest Port 1 View  05/18/2015  CLINICAL DATA:  Sepsis.  Nausea. EXAM: PORTABLE CHEST 1 VIEW COMPARISON:  05/16/2015. FINDINGS: Poor inspiration. Rotation to the right. Normal sized heart. Clear lungs. Mildly prominent interstitial markings. Diffuse osteopenia. IMPRESSION: No acute abnormality.  Mild chronic interstitial lung disease. Electronically Signed   By: Claudie Revering M.D.   On: 05/18/2015 10:56    Microbiology: Recent Results (from the past 240 hour(s))  Blood Culture (routine x 2)     Status: None   Collection Time: 05/16/15  7:44 PM  Result Value Ref Range Status   Specimen Description BLOOD LEFT HAND  Final   Special Requests BOTTLES DRAWN AEROBIC AND ANAEROBIC 5 ML   Final   Culture   Final    NO GROWTH 5 DAYS Performed at [facility]    Report Status 05/21/2015 FINAL  Final  Blood Culture (routine x 2)     Status: None   Collection Time: 05/16/15  7:50 PM  Result Value Ref Range Status   Specimen Description BLOOD LEFT ANTECUBITAL  Final   Special Requests BOTTLES DRAWN AEROBIC AND ANAEROBIC 5 ML  Final   Culture   Final    NO GROWTH 5 DAYS Performed at [facility]    Report Status 05/21/2015 FINAL  Final  Urine culture     Status: None   Collection Time: 05/16/15 11:01 PM  Result Value Ref Range Status   Specimen Description URINE, CATHETERIZED  Final   Special Requests NONE  Final   Culture   Final    NO GROWTH 1 DAY Performed at [facility]    Report Status 05/18/2015 FINAL  Final  MRSA PCR Screening     Status: None   Collection Time: 05/17/15  2:30 AM  Result Value Ref Range Status   MRSA by PCR NEGATIVE NEGATIVE  Final    Comment:        The GeneXpert MRSA Assay (FDA approved for NASAL specimens only), is one component of a comprehensive MRSA colonization surveillance program. It is not intended to diagnose MRSA infection nor to guide or monitor treatment for MRSA infections.   C difficile quick scan w PCR reflex     Status: None   Collection Time: 05/19/15  3:38 AM  Result Value Ref Range Status   C Diff antigen NEGATIVE NEGATIVE Final   C Diff toxin NEGATIVE NEGATIVE Final   C Diff interpretation Negative for toxigenic C. difficile  Final     Labs: Basic Metabolic Panel:  Recent Labs Lab 05/17/15 0157 05/18/15 0531 05/18/15 1849 05/19/15 0437 05/21/15 0449  NA 140 137 137 137 141  K 3.8 4.1 4.2 4.3 4.6  CL 107 104 106 104 106  CO2 20* 24 26 25 25   GLUCOSE 83 119* 84 58* 118*  BUN 23* 13 13 13 13   CREATININE 1.15* 0.96 0.96 1.01* 0.87  CALCIUM 8.1* 8.2* 7.9* 7.9* 8.5*   Liver Function Tests:  Recent Labs Lab 05/16/15 1947  AST 20  ALT 16  ALKPHOS 74  BILITOT 1.2  PROT 6.9   ALBUMIN 2.6*   CBC:  Recent Labs Lab 05/16/15 1947 05/18/15 0531 05/18/15 1849 05/19/15 0437 05/21/15 0449  WBC 18.1* 19.3* 18.5* 20.2* 13.5*  NEUTROABS 15.1*  --   --   --   --   HGB 10.4* 9.7* 9.6* 9.0* 9.3*  HCT 31.4* 29.7* 28.6* 27.3* 28.3*  MCV 93.2 91.7 91.7 95.5 95.3  PLT 293 317 307 309 433*   CBG:  Recent Labs Lab 05/20/15 1159 05/20/15 1719 05/20/15 2114 05/21/15 0748 05/21/15 1212  GLUCAP 139* 133* 162* 104* 126*    Signed:  Barton Dubois MD.  [facility] 05/21/2015, 3:45 PM

## 2015-05-21 NOTE — Progress Notes (Signed)
Report given to nurse at [place]. Patient stable at discharge.

## 2015-05-21 NOTE — Progress Notes (Addendum)
Patient is set to discharge back to [facility] today. CSW spoke with patient's son, Joneen Boers (ph#: [phone]) to make him aware. Discharge packet given to RN, Geni Bers. [facility] called for transport.     Raynaldo Opitz, [facility] Clinical Social Worker cell #: [phone]

## 2015-05-22 ENCOUNTER — Non-Acute Institutional Stay (SKILLED_NURSING_FACILITY): Payer: Medicare Other | Admitting: Internal Medicine

## 2015-05-22 ENCOUNTER — Encounter: Payer: Self-pay | Admitting: Internal Medicine

## 2015-05-22 DIAGNOSIS — E785 Hyperlipidemia, unspecified: Secondary | ICD-10-CM

## 2015-05-22 DIAGNOSIS — R197 Diarrhea, unspecified: Secondary | ICD-10-CM | POA: Diagnosis not present

## 2015-05-22 DIAGNOSIS — N179 Acute kidney failure, unspecified: Secondary | ICD-10-CM | POA: Diagnosis not present

## 2015-05-22 DIAGNOSIS — N10 Acute tubulo-interstitial nephritis: Secondary | ICD-10-CM | POA: Diagnosis not present

## 2015-05-22 DIAGNOSIS — Z794 Long term (current) use of insulin: Secondary | ICD-10-CM | POA: Diagnosis not present

## 2015-05-22 DIAGNOSIS — A419 Sepsis, unspecified organism: Secondary | ICD-10-CM

## 2015-05-22 DIAGNOSIS — I10 Essential (primary) hypertension: Secondary | ICD-10-CM

## 2015-05-22 DIAGNOSIS — E119 Type 2 diabetes mellitus without complications: Secondary | ICD-10-CM | POA: Diagnosis not present

## 2015-05-22 NOTE — Progress Notes (Signed)
MRN: [id number] Name: Ana Newman  Sex: female Age: 80 y.o. DOB: 1936/01/02  [place] #: Kar[facility] Facility/Room:115 Level Of Care: SNF Provider: Inocencio Homes D Emergency Contacts: Extended Emergency Contact Information Primary Emergency Contact: Fill,CHRISTY Address: [address]          [place]  [place] Home Phone: [phone] Relation: None Secondary Emergency Contact: Buswell,Harold  [place] Mobile Phone: [phone] Relation: Son  Code Status:   Allergies: Review of patient's allergies indicates no known allergies.  Chief Complaint  Patient presents with  . Readmit To SNF    HPI: Patient is 80 y.o. female with dementia, insulin-dependent diabetes mellitus, hypertension, and hyperlipidemia who presents from her nursing facility with fevers and lower abdominal pain. Patient was diagnosed with a urinary tract infection 2 days ago and started on Rocephin IM at her nursing facility. Despite this treatment, her condition is continued to worsen with daily fevers and persistent lower abdominal pain. Patient does not have a urinary catheter and is not known to have recurrent UTIs. Pt was admitted to [facility] from 4/14-19 where she was treated for sepsis 2/2 pyelonephritis from B ureteral stones with obstruction. Hospital course was complicated by AKI and diarrhea. Pt is admitted to SNF for residential care. While at SNF pt will be follwed for DM, tx with insulin, HTN, tx with lisinopril and metoprolol and HLD, tx with lipitor.  Past Medical History  Diagnosis Date  . Reflux esophagitis   . Unspecified constipation   . Essential hypertension, benign   . Alzheimer's disease   . Type II or unspecified type diabetes mellitus without mention of complication, uncontrolled   . Rickets, active   . Unspecified psychosis   . Unspecified vitamin D deficiency   . Osteoarthritis     Past Surgical History  Procedure Laterality Date  . Abdominal hysterectomy    .  Joint replacement Left   . Cystoscopy w/ ureteral stent placement Bilateral 05/18/2015    Procedure: CYSTOSCOPY WITH RETROGRADE PYELOGRAM/URETERAL STENT PLACEMENT;  Surgeon: Carolan Clines, MD;  Location: [facility];  Service: Urology;  Laterality: Bilateral;      Medication List       This list is accurate as of: 05/22/15 11:59 PM.  Always use your most recent med list.               acetaminophen 325 MG tablet  Commonly known as:  TYLENOL  Take 650 mg by mouth every 6 (six) hours as needed.     acetaminophen 500 MG tablet  Commonly known as:  TYLENOL  Take 1,000 mg by mouth daily.     aspirin 325 MG tablet  Take 325 mg by mouth daily. Take 1 tablet by mouth once daily.     atorvastatin 10 MG tablet  Commonly known as:  LIPITOR  Take 10 mg by mouth daily at 6 PM.     bisacodyl 5 MG EC tablet  Commonly known as:  DULCOLAX  Take 10 mg by mouth daily as needed for moderate constipation.     cholecalciferol 1000 units tablet  Commonly known as:  VITAMIN D  Take 2,000 Units by mouth daily.     ciprofloxacin 500 MG tablet  Commonly known as:  CIPRO  Take 1 tablet (500 mg total) by mouth 2 (two) times daily.     docusate sodium 100 MG capsule  Commonly known as:  COLACE  Take 100 mg by mouth daily as needed.     FRESHKOTE 2.7-2 %  Soln  Generic drug:  Polyvinyl Alcohol-Povidone  Apply 1 drop to eye 3 (three) times daily. To both eyes     insulin glargine 100 UNIT/ML injection  Commonly known as:  LANTUS  Inject 20-25 Units into the skin 2 (two) times daily. Take 25 units in the AM and 20 units in the PM     lisinopril 2.5 MG tablet  Commonly known as:  PRINIVIL,ZESTRIL  Take 2 tablets (5 mg total) by mouth daily.     loratadine 10 MG tablet  Commonly known as:  CLARITIN  Take 10 mg by mouth daily as needed for allergies.     metoprolol tartrate 25 MG tablet  Commonly known as:  LOPRESSOR  Take 25 mg by mouth 2 (two) times daily. For HTN     polyethylene glycol  packet  Commonly known as:  MIRALAX / GLYCOLAX  Take 17 g by mouth daily. Give one 17 gram by mouth once daily for constipation.     SENOKOT S 8.6-50 MG tablet  Generic drug:  senna-docusate  Take 2 tablets by mouth at bedtime.        No orders of the defined types were placed in this encounter.    Immunization History  Administered Date(s) Administered  . Influenza Whole 11/09/2012  . Influenza-Unspecified 11/12/2013, 11/11/2014  . Pneumococcal-Unspecified 01/03/2008    Social History  Substance Use Topics  . Smoking status: Never Smoker   . Smokeless tobacco: Never Used  . Alcohol Use: No    Family history is UTO 2/2 pt confusion  Review of    UTO reliable but pt denies pain; nursing without concerns except pt's confusion which is not unexpected   Filed Vitals:   05/29/15 2033  BP: 136/65  Pulse: 82  Temp: 99.1 F (37.3 C)  Resp: 17    SpO2 Readings from Last 1 Encounters:  05/21/15 97%        Physical Exam  GENERAL APPEARANCE: Alert, conversant,  No acute distress.  SKIN: No diaphoresis rash HEAD: Normocephalic, atraumatic  EYES: Conjunctiva/lids clear. Pupils round, reactive. EOMs intact.  EARS: External exam WNL, canals clear. Hearing grossly normal.  NOSE: No deformity or discharge.  MOUTH/THROAT: Lips w/o lesions  RESPIRATORY: Breathing is even, unlabored. Lung sounds are clear   CARDIOVASCULAR: Heart RRR no murmurs, rubs or gallops. No peripheral edema.   GASTROINTESTINAL: Abdomen is soft, non-tender, not distended w/ normal bowel sounds. GENITOURINARY: Bladder non tender, not distended  MUSCULOSKELETAL: No abnormal joints or musculature NEUROLOGIC:  Cranial nerves 2-12 grossly intact. Moves all extremities  PSYCHIATRIC: dementia, confusion, no behavioral issues  Patient Active Problem List   Diagnosis Date Noted  . Acute pyelonephritis 05/29/2015  . Diarrhea 05/29/2015  . Nephrolithiasis 05/18/2015  . Sepsis ([place]) 05/17/2015  . AKI  (acute kidney injury) ([place]) 05/17/2015  . UTI (lower urinary tract infection) 05/17/2015  . Environmental and seasonal allergies 04/06/2015  . Dyslipidemia associated with type 2 diabetes mellitus ([place]) 02/04/2015  . Diabetes mellitus, type II, insulin dependent ([place]) 02/04/2015  . Alzheimer's disease 07/10/2014  . Dyslipidemia 03/04/2014  . Depression 01/18/2014  . Insomnia 01/18/2014  . Hypothyroidism 01/18/2014  . Essential hypertension, benign   . Psychosis   . Vitamin D deficiency   . Osteoarthritis     CBC    Component Value Date/Time   WBC 13.5* 05/21/2015 0449   WBC 8.1 07/25/2013   RBC 2.97* 05/21/2015 0449   HGB 9.3* 05/21/2015 0449   HCT 28.3* 05/21/2015 0449  PLT 433* 05/21/2015 0449   MCV 95.3 05/21/2015 0449   LYMPHSABS 1.6 05/16/2015 1947   MONOABS 1.4* 05/16/2015 1947   EOSABS 0.0 05/16/2015 1947   BASOSABS 0.0 05/16/2015 1947    CMP     Component Value Date/Time   NA 141 05/21/2015 0449   NA 138 02/27/2014   K 4.6 05/21/2015 0449   CL 106 05/21/2015 0449   CO2 25 05/21/2015 0449   GLUCOSE 118* 05/21/2015 0449   BUN 13 05/21/2015 0449   BUN 20 02/27/2014   CREATININE 0.87 05/21/2015 0449   CREATININE 0.8 02/27/2014   CALCIUM 8.5* 05/21/2015 0449   PROT 6.9 05/16/2015 1947   ALBUMIN 2.6* 05/16/2015 1947   AST 20 05/16/2015 1947   ALT 16 05/16/2015 1947   ALKPHOS 74 05/16/2015 1947   BILITOT 1.2 05/16/2015 1947   GFRNONAA >60 05/21/2015 0449   GFRAA >60 05/21/2015 0449    Lab Results  Component Value Date   HGBA1C 7.0* 05/17/2015     Dg Chest 2 View  05/16/2015  CLINICAL DATA:  Fever.  Sepsis. EXAM: CHEST  2 VIEW COMPARISON:  02/02/2005. FINDINGS: Normal sized heart. Clear lungs. Mild diffuse peribronchial thickening. Unremarkable bones. IMPRESSION: Stable mild chronic bronchitic changes.  No acute abnormality. Electronically Signed   By: Claudie Revering M.D.   On: 05/16/2015 20:22    Not all labs, radiology exams or other studies done  during hospitalization come through on my EPIC note; however they are reviewed by me.    Assessment and Plan  Sepsis ([place]) 1. due to UTI -Present on admission, evidenced by temperature of 103.4, heart rate of 116, respiratory rate of 31, white count of 18,100 -Suspect source of infection to be urinary tract infection  -She was started on cefepime IV -On 05/03/2015 she had a CT scan of abdomen and pelvis with IV contrast, study revealed bilateral obstructive uropathy. Radiology reporting moderate left-sided hydroureteronephrosis with large burden bilateral renal collecting systems stones. -Given CT findings urology was consulted as she was taken to the operating room on the evening of 05/18/2015 where she underwent cystoscopy with placement of bilateral stents. She tolerated procedure well. -On 05/19/2015 patient showing clinical improvement and after 24 hours without fever on oral antibiotics and with essentially resolution of sepsis features will be discharge to SNF. -outpatient follow up by urology service in 2-3 weeks SNF - cipro 500 BID for 7 more days  Acute pyelonephritis CT scan performed on 05/18/2015 showing bilateral obstructing renal calculi. -On 05/18/2015 status post cystoscopy with stent placement. -Urine culture showing no growth, however, had been given antimicrobial therapy prior to this hospitalization. SNF -  Cipro 500 mg by mouth twice a day and complete 7 more days of antibiotics   Diarrhea She was recently diagnosed with urinary tract infection at her facility and was treated with ceftriaxone IM. -She comes in having multiple episodes of diarrhea -Stool for C. difficile negative, diarrhea improved/resolved at discharge SNF -continue florastor  AKI (acute kidney injury) ([place]) due to UTI, continue use of ACE and decrease perfusion with SEPSIS -resolved and back to normal at discharge -ok to resume ACE inhibitors SNF - restart lisinopril 2.5 mg daily; f/u BMP    Diabetes mellitus, type II, insulin dependent (HCC) SNF  Cont lantus insulin;Pt on ACE and statin; BMP to follow  Dyslipidemia SNF - cont lipitor 10 mg daily  Essential hypertension, benign SNF - stab;le on Lisinopril 5 mg daily and metoprolol 25 mg BID   Time  spent > 45 min;> 50% of time with patient was spent reviewing records, labs, tests and studies, counseling and developing plan of care  Hennie Duos, MD

## 2015-05-26 LAB — BASIC METABOLIC PANEL
BUN: 13 mg/dL (ref 4–21)
Creatinine: 0.7 mg/dL (ref 0.5–1.1)
GLUCOSE: 123 mg/dL
Potassium: 5 mmol/L (ref 3.4–5.3)
Sodium: 137 mmol/L (ref 137–147)

## 2015-05-27 ENCOUNTER — Other Ambulatory Visit: Payer: Self-pay | Admitting: Urology

## 2015-05-27 ENCOUNTER — Encounter: Payer: Self-pay | Admitting: Internal Medicine

## 2015-05-27 DIAGNOSIS — N132 Hydronephrosis with renal and ureteral calculous obstruction: Secondary | ICD-10-CM | POA: Diagnosis not present

## 2015-05-27 DIAGNOSIS — N201 Calculus of ureter: Secondary | ICD-10-CM | POA: Diagnosis not present

## 2015-05-27 NOTE — Assessment & Plan Note (Signed)
BUN/CR 36/1.45 , IVF were ordered last night, pt got 400 cc and catheter is out; have ordered IVF for total 2 liters with repeat chemistry 4/17 Mon; will monitor

## 2015-05-27 NOTE — Assessment & Plan Note (Signed)
U/A with 3+ bacteria, 50-100 WBC, WBC 15.6, pt with altered sensoriun and vomiting;start rocephin 1gm IM daily for 7 days or until MIC comes out.

## 2015-05-27 NOTE — Progress Notes (Signed)
MRN: [id number] Name: Ana Newman  Sex: female Age: 80 y.o. DOB: [birth year]  [place] #: [facility] Burly Facility/Room:115 Level Of Care: SNF Provider: Inocencio Homes D Emergency Contacts: Extended Emergency Contact Information Primary Emergency Contact: Minkler,CHRISTY Address: [address]          [place]  [place] Home Phone: [phone] Relation: None Secondary Emergency Contact: Straw,Harold  [place] Mobile Phone: [phone] Relation: Son  Code Status:   Allergies: Review of patient's allergies indicates no known allergies.  Chief Complaint  Patient presents with  . Acute Visit    HPI: Patient is 80 y.o. female who is being seen acutely for vomiting onset last night along with a mental status change, no fever. Stat labs were ordered and IVF were started. Pt's U/A c/w UTI and WBC 15.6.  Past Medical History  Diagnosis Date  . Reflux esophagitis   . Unspecified constipation   . Essential hypertension, benign   . Alzheimer's disease   . Type II or unspecified type diabetes mellitus without mention of complication, uncontrolled   . Rickets, active   . Unspecified psychosis   . Unspecified vitamin D deficiency   . Osteoarthritis     Past Surgical History  Procedure Laterality Date  . Abdominal hysterectomy    . Joint replacement Left   . Cystoscopy w/ ureteral stent placement Bilateral 05/18/2015    Procedure: CYSTOSCOPY WITH RETROGRADE PYELOGRAM/URETERAL STENT PLACEMENT;  Surgeon: Carolan Clines, MD;  Location: [facility];  Service: Urology;  Laterality: Bilateral;      Medication List       This list is accurate as of: 05/15/15 11:59 PM.  Always use your most recent med list.               acetaminophen 325 MG tablet  Commonly known as:  TYLENOL  Take 650 mg by mouth every 6 (six) hours as needed.     acetaminophen 500 MG tablet  Commonly known as:  TYLENOL  Take 1,000 mg by mouth daily.     aspirin 325 MG tablet  Take 325  mg by mouth daily. Take 1 tablet by mouth once daily.     atorvastatin 10 MG tablet  Commonly known as:  LIPITOR  Take 10 mg by mouth daily at 6 PM.     bisacodyl 5 MG EC tablet  Commonly known as:  DULCOLAX  Take 10 mg by mouth daily as needed for moderate constipation.     cholecalciferol 1000 units tablet  Commonly known as:  VITAMIN D  Take 2,000 Units by mouth daily.     docusate sodium 100 MG capsule  Commonly known as:  COLACE  Take 100 mg by mouth daily as needed.     FRESHKOTE 2.7-2 % Soln  Generic drug:  Polyvinyl Alcohol-Povidone  Apply 1 drop to eye 3 (three) times daily. To both eyes     insulin glargine 100 UNIT/ML injection  Commonly known as:  LANTUS  Inject 20-25 Units into the skin 2 (two) times daily. Take 25 units in the AM and 20 units in the PM     lisinopril 2.5 MG tablet  Commonly known as:  PRINIVIL,ZESTRIL  Take 2 tablets (5 mg total) by mouth daily.     loratadine 10 MG tablet  Commonly known as:  CLARITIN  Take 10 mg by mouth daily as needed for allergies.     metoprolol tartrate 25 MG tablet  Commonly known as:  LOPRESSOR  Take 25 mg by  mouth 2 (two) times daily. For HTN     polyethylene glycol packet  Commonly known as:  MIRALAX / GLYCOLAX  Take 17 g by mouth daily. Give one 17 gram by mouth once daily for constipation.     SENOKOT S 8.6-50 MG tablet  Generic drug:  senna-docusate  Take 2 tablets by mouth at bedtime.        No orders of the defined types were placed in this encounter.    Immunization History  Administered Date(s) Administered  . Influenza Whole 11/09/2012  . Influenza-Unspecified 11/12/2013, 11/11/2014  . Pneumococcal-Unspecified 01/03/2008    Social History  Substance Use Topics  . Smoking status: Never Smoker   . Smokeless tobacco: Never Used  . Alcohol Use: No    Review of Systems  DATA OBTAINED: from patient, nurse GENERAL:  no fevers, fatigue, appetite changes SKIN: No itching, rash HEENT: No  complaint RESPIRATORY: No cough, wheezing, SOB CARDIAC: No chest pain, palpitations, lower extremity edema  GI: No abdominal pain,+ N/V, no Diarrhea or constipation, No heartburn or reflux  GU: No dysuria, frequency or urgency, or incontinence but low abd pain  MUSCULOSKELETAL: No unrelieved bone/joint pain NEUROLOGIC: No headache, dizziness  PSYCHIATRIC: No overt anxiety or sadness  Filed Vitals:   05/27/15 1256  BP: 117/68  Pulse: 88  Temp: 98 F (36.7 C)  Resp: 17    Physical Exam  GENERAL APPEARANCE: Alert, conversant, No acute distress except saying odd things SKIN: No diaphoresis rash HEENT: Unremarkable RESPIRATORY: Breathing is even, unlabored. Lung sounds are clear   CARDIOVASCULAR: Heart RRR no murmurs, rubs or gallops. No peripheral edema  GASTROINTESTINAL: Abdomen is soft, non-tender, not distended w/ normal bowel sounds, no CVA tender  GENITOURINARY: Bladder +``` tender, not distended  MUSCULOSKELETAL: No abnormal joints or musculature NEUROLOGIC: Cranial nerves 2-12 grossly intact. Moves all extremities PSYCHIATRIC: Pt would like her miralax IV because she needs nutrition  Patient Active Problem List   Diagnosis Date Noted  . Nephrolithiasis 05/18/2015  . Sepsis ([place]) 05/17/2015  . AKI (acute kidney injury) ([place]) 05/17/2015  . UTI (lower urinary tract infection) 05/17/2015  . Environmental and seasonal allergies 04/06/2015  . Dyslipidemia associated with type 2 diabetes mellitus ([place]) 02/04/2015  . Diabetes mellitus, type II, insulin dependent ([place]) 02/04/2015  . Alzheimer's disease 07/10/2014  . Depression 01/18/2014  . Insomnia 01/18/2014  . Hypothyroidism 01/18/2014  . Essential hypertension, benign   . Psychosis   . Vitamin D deficiency   . Osteoarthritis     CBC    Component Value Date/Time   WBC 13.5* 05/21/2015 0449   WBC 8.1 07/25/2013   RBC 2.97* 05/21/2015 0449   HGB 9.3* 05/21/2015 0449   HCT 28.3* 05/21/2015 0449   PLT 433*  05/21/2015 0449   MCV 95.3 05/21/2015 0449   LYMPHSABS 1.6 05/16/2015 1947   MONOABS 1.4* 05/16/2015 1947   EOSABS 0.0 05/16/2015 1947   BASOSABS 0.0 05/16/2015 1947    CMP     Component Value Date/Time   NA 141 05/21/2015 0449   NA 138 02/27/2014   K 4.6 05/21/2015 0449   CL 106 05/21/2015 0449   CO2 25 05/21/2015 0449   GLUCOSE 118* 05/21/2015 0449   BUN 13 05/21/2015 0449   BUN 20 02/27/2014   CREATININE 0.87 05/21/2015 0449   CREATININE 0.8 02/27/2014   CALCIUM 8.5* 05/21/2015 0449   PROT 6.9 05/16/2015 1947   ALBUMIN 2.6* 05/16/2015 1947   AST 20 05/16/2015 1947   ALT  16 05/16/2015 1947   ALKPHOS 74 05/16/2015 1947   BILITOT 1.2 05/16/2015 1947   GFRNONAA >60 05/21/2015 0449   GFRAA >60 05/21/2015 0449    Assessment and Plan  UTI (lower urinary tract infection) U/A with 3+ bacteria, 50-100 WBC, WBC 15.6, pt with altered sensoriun and vomiting;start rocephin 1gm IM daily for 7 days or until MIC comes out.   AKI (acute kidney injury) ([place]) BUN/CR 36/1.45 , IVF were ordered last night, pt got 400 cc and catheter is out; have ordered IVF for total 2 liters with repeat chemistry 4/17 Mon; will monitor   Time spent > 35 min;> 50% of time with patient was spent reviewing records, labs, tests and studies, counseling and developing plan of care  Hennie Duos, MD

## 2015-05-29 ENCOUNTER — Encounter: Payer: Self-pay | Admitting: Internal Medicine

## 2015-05-29 DIAGNOSIS — N10 Acute tubulo-interstitial nephritis: Secondary | ICD-10-CM | POA: Insufficient documentation

## 2015-05-29 DIAGNOSIS — R197 Diarrhea, unspecified: Secondary | ICD-10-CM | POA: Insufficient documentation

## 2015-05-29 NOTE — Assessment & Plan Note (Signed)
SNF - stab;le on Lisinopril 5 mg daily and metoprolol 25 mg BID

## 2015-05-29 NOTE — Assessment & Plan Note (Signed)
SNF - cont lipitor 10 mg daily 

## 2015-05-29 NOTE — Assessment & Plan Note (Signed)
due to UTI, continue use of ACE and decrease perfusion with SEPSIS -resolved and back to normal at discharge -ok to resume ACE inhibitors SNF - restart lisinopril 2.5 mg daily; f/u BMP

## 2015-05-29 NOTE — Assessment & Plan Note (Signed)
She was recently diagnosed with urinary tract infection at her facility and was treated with ceftriaxone IM. -She comes in having multiple episodes of diarrhea -Stool for C. difficile negative, diarrhea improved/resolved at discharge SNF -continue florastor

## 2015-05-29 NOTE — Assessment & Plan Note (Signed)
1. due to UTI -Present on admission, evidenced by temperature of 103.4, heart rate of 116, respiratory rate of 31, white count of 18,100 -Suspect source of infection to be urinary tract infection  -She was started on cefepime IV -On 05/03/2015 she had a CT scan of abdomen and pelvis with IV contrast, study revealed bilateral obstructive uropathy. Radiology reporting moderate left-sided hydroureteronephrosis with large burden bilateral renal collecting systems stones. -Given CT findings urology was consulted as she was taken to the operating room on the evening of 05/18/2015 where she underwent cystoscopy with placement of bilateral stents. She tolerated procedure well. -On 05/19/2015 patient showing clinical improvement and after 24 hours without fever on oral antibiotics and with essentially resolution of sepsis features will be discharge to SNF. -outpatient follow up by urology service in 2-3 weeks SNF - cipro 500 BID for 7 more days

## 2015-05-29 NOTE — Assessment & Plan Note (Addendum)
SNF  Cont lantus insulin;Pt on ACE and statin; BMP to follow

## 2015-05-29 NOTE — Assessment & Plan Note (Signed)
CT scan performed on 05/18/2015 showing bilateral obstructing renal calculi. -On 05/18/2015 status post cystoscopy with stent placement. -Urine culture showing no growth, however, had been given antimicrobial therapy prior to this hospitalization. SNF -  Cipro 500 mg by mouth twice a day and complete 7 more days of antibiotics

## 2015-06-02 LAB — HM DIABETES FOOT EXAM

## 2015-06-09 ENCOUNTER — Other Ambulatory Visit: Payer: Self-pay | Admitting: *Deleted

## 2015-06-09 MED ORDER — OXYCODONE-ACETAMINOPHEN 5-325 MG PO TABS: ORAL_TABLET | ORAL | Status: DC

## 2015-06-09 NOTE — Telephone Encounter (Signed)
[facility] 

## 2015-06-17 ENCOUNTER — Encounter: Payer: Self-pay | Admitting: Adult Health

## 2015-06-17 ENCOUNTER — Non-Acute Institutional Stay (SKILLED_NURSING_FACILITY): Payer: Medicare Other | Admitting: Adult Health

## 2015-06-17 DIAGNOSIS — N200 Calculus of kidney: Secondary | ICD-10-CM

## 2015-06-17 DIAGNOSIS — E034 Atrophy of thyroid (acquired): Secondary | ICD-10-CM | POA: Diagnosis not present

## 2015-06-17 DIAGNOSIS — M159 Polyosteoarthritis, unspecified: Secondary | ICD-10-CM

## 2015-06-17 DIAGNOSIS — F0280 Dementia in other diseases classified elsewhere without behavioral disturbance: Secondary | ICD-10-CM | POA: Diagnosis not present

## 2015-06-17 DIAGNOSIS — E785 Hyperlipidemia, unspecified: Secondary | ICD-10-CM

## 2015-06-17 DIAGNOSIS — G308 Other Alzheimer's disease: Secondary | ICD-10-CM | POA: Diagnosis not present

## 2015-06-17 DIAGNOSIS — I10 Essential (primary) hypertension: Secondary | ICD-10-CM

## 2015-06-17 DIAGNOSIS — E038 Other specified hypothyroidism: Secondary | ICD-10-CM | POA: Diagnosis not present

## 2015-06-17 DIAGNOSIS — M150 Primary generalized (osteo)arthritis: Secondary | ICD-10-CM | POA: Diagnosis not present

## 2015-06-17 DIAGNOSIS — E119 Type 2 diabetes mellitus without complications: Secondary | ICD-10-CM | POA: Diagnosis not present

## 2015-06-17 DIAGNOSIS — N320 Bladder-neck obstruction: Secondary | ICD-10-CM | POA: Insufficient documentation

## 2015-06-17 DIAGNOSIS — E1169 Type 2 diabetes mellitus with other specified complication: Secondary | ICD-10-CM | POA: Diagnosis not present

## 2015-06-17 DIAGNOSIS — Z794 Long term (current) use of insulin: Secondary | ICD-10-CM | POA: Diagnosis not present

## 2015-06-17 NOTE — Progress Notes (Signed)
Patient ID: Ana Newman, female   DOB: [birth year], 80 y.o.   MRN: [id number]    Location:  [facility] Room Number: 115-B Place of Service:  SNF (31)   CODE STATUS: Full Code  No Known Allergies  Chief Complaint  Patient presents with  . Medical Management of Chronic Issues    Follow up    HPI:  She is a long term resident of this facility being seen for the management of her chronic illnesses. Overall her status is stable. She told me that she does not like the foley. She is due to have cystoscopy bilateral JJ stent exchange, bilateral retrograde pyelogram, left ureteroscopy left pyeloscopy laser of ureteral and renal pelvic stones at urology with Dr.Tannenbaum. There are no nursing concerns at this time.    Past Medical History  Diagnosis Date  . Reflux esophagitis   . Unspecified constipation   . Essential hypertension, benign   . Alzheimer's disease   . Type II or unspecified type diabetes mellitus without mention of complication, uncontrolled   . Rickets, active   . Unspecified psychosis   . Unspecified vitamin D deficiency   . Osteoarthritis     Past Surgical History  Procedure Laterality Date  . Abdominal hysterectomy    . Joint replacement Left   . Cystoscopy w/ ureteral stent placement Bilateral 05/18/2015    Procedure: CYSTOSCOPY WITH RETROGRADE PYELOGRAM/URETERAL STENT PLACEMENT;  Surgeon: Carolan Clines, MD;  Location: [facility];  Service: Urology;  Laterality: Bilateral;    Social History   Social History  . Marital Status: Married    Spouse Name: N/A  . Number of Children: N/A  . Years of Education: N/A   Occupational History  . Not on file.   Social History Main Topics  . Smoking status: Never Smoker   . Smokeless tobacco: Never Used  . Alcohol Use: No  . Drug Use: No  . Sexual Activity: Not on file   Other Topics Concern  . Not on file   Social History Narrative   Family History  Problem Relation  Age of Onset  . Family history unknown: Yes      VITAL SIGNS BP 148/75 mmHg  Pulse 67  Temp(Src) 98.1 F (36.7 C) (Oral)  Resp 18  Ht 4\' 11"  (1.499 m)  Wt 163 lb (73.936 kg)  BMI 32.90 kg/m2  SpO2 98%  Patient's Medications  New Prescriptions   No medications on file  Previous Medications   ACETAMINOPHEN (TYLENOL) 325 MG TABLET    Take 650 mg by mouth every 6 (six) hours as needed.   ASPIRIN 325 MG TABLET    Take 325 mg by mouth daily. Take 1 tablet by mouth once daily.   ATORVASTATIN (LIPITOR) 10 MG TABLET    Take 10 mg by mouth daily at 6 PM.   BISACODYL (DULCOLAX) 5 MG EC TABLET    Take 10 mg by mouth daily as needed for moderate constipation.   CHOLECALCIFEROL (VITAMIN D) 1000 UNITS TABLET    Take 2,000 Units by mouth daily.   DOCUSATE SODIUM (COLACE) 100 MG CAPSULE    Take 100 mg by mouth daily as needed.    INSULIN GLARGINE (LANTUS) 100 UNIT/ML INJECTION    Inject 20-25 Units into the skin 2 (two) times daily. Take 25 units in the AM and 20 units in the PM   LISINOPRIL (PRINIVIL,ZESTRIL) 2.5 MG TABLET    Take 2 tablets (5 mg total) by mouth daily.  LORATADINE (CLARITIN) 10 MG TABLET    Take 10 mg by mouth daily as needed for allergies.    METOPROLOL TARTRATE (LOPRESSOR) 25 MG TABLET    Take 25 mg by mouth 2 (two) times daily. For HTN   OXYCODONE-ACETAMINOPHEN (ROXICET) 5-325 MG TABLET    Take one tablet by mouth every 4 hours as needed for pain. DNE 3gm of APAP in 24 hours   POLYETHYLENE GLYCOL (MIRALAX / GLYCOLAX) PACKET    Take 17 g by mouth daily. Give one 17 gram by mouth once daily for constipation.   POLYVINYL ALCOHOL-POVIDONE (FRESHKOTE) 2.7-2 % SOLN    Apply 1 drop to eye 3 (three) times daily. To both eyes   SENNA-DOCUSATE (SENOKOT S) 8.6-50 MG PER TABLET    Take 2 tablets by mouth at bedtime.   Modified Medications   No medications on file  Discontinued Medications   ACETAMINOPHEN (TYLENOL) 500 MG TABLET    Take 1,000 mg by mouth daily. Reported on 06/17/2015      SIGNIFICANT DIAGNOSTIC EXAMS  05-13-15: chest x-ray: no active disease is seen in chest  05-16-15: chest x-ray; Stable mild chronic bronchitic changes.  No acute abnormality.   05-18-15: chest x-ray: No acute abnormality.  Mild chronic interstitial lung disease.   05-18-15: ct of abdomen and pelvis: 1. Motion and hardware degraded images. 2. Moderate left-sided hydroureteronephrosis secondary to a distal left ureteric 11 x 13 mm calculus. Decreased left-sided renal function. 3. Large burden bilateral renal collecting system stones, including stones within both extrarenal pelves. 4. Cirrhosis with small volume ascites. 5. Proctitis, likely related to C. difficile colitis, given the clinical history. 6. Bladder wall irregularity which could relate to cystitis. Bladder diverticulum, suggesting a component of outlet obstruction. 7. Skin irregularity superficial to the coccyx. Recommend physical exam correlation to exclude decubitus ulcer. No gross osseous destruction.   LABS REVIEWED:     08-09-14: glucose 128; bun 13.9; creat 0.58; k+ 5.0; na++136; liver normal albumin 3.2; tsh 3.36;   Chol 205; ldl 108; trig 309; hdl 35  11-26-14: wbc 8.0; hgb 12.1; hct 39.0; mcv 95.7; plt 339; hgb a1c 7.1  12-11-14: vit d 37.32  02-05-15: chol 156; ldl 71; trig 207; hdl 44; tsh 5.01 03-06-15: wbc 8.9;  hgb 13.2; hct 41.1; mcv 93.8; plt 274; glucose 142; bun 15.2; creat 0.72; k+ 4.4; na++132; liver normal albumin 3.6; hgb a1c 7.3  05-14-15; wbc 15.6; hgb 11.1 ;hct 33.3; mcv 92.6; plt 243; glucose 84; bun 36; creat 1.45; k+ 4.0; na++134  Liver normal albumin 2.9  05-16-15: wbc 18.0; hgb 10.4; hct 31.4; mcv 93.2 ;plt 293; glucose 108; bun 26; creat 1.28; k+ 3.7; na++135; liver normal albumin 2.6; urine culture: no growth; blood culture: no growth 05-17-15: glucose 83; bun 23; creat 1.15; k+ 3.8; na++140  hgb a1c 7.0 05-19-15: wbc 20.2; hgb 9.0; hgb 27.3; mcv 95.5; plt 309; glucose 58; bun 13; creat 1.01; k+ 4.3;  na++13 05-21-15: wbc 13.5; hgb 9.3; hct 28.3; mcv 95.3; plt 433 05-26-15: glucose 123; bun 13; creat 0.7; k+ 5.0; na++ 137      Review of Systems Constitutional: Negative for appetite change and fatigue.  Respiratory: Negative for cough, chest tightness and shortness of breath.   Cardiovascular: Negative for chest pain, palpitations and leg swelling.  Gastrointestinal: Negative for nausea, abdominal pain, diarrhea and constipation.  Musculoskeletal: Negative for myalgias and arthralgias.  Skin: Negative for pallor.  Psychiatric/Behavioral: The patient is not nervous/anxious.  Physical Exam Constitutional: No distress.  Eyes: Conjunctivae are normal.  Neck: Neck supple. No JVD present. No thyromegaly present.  Cardiovascular: Normal rate, regular rhythm and intact distal pulses.   Respiratory: Effort normal and breath sounds normal. No respiratory distress. She has no wheezes.  GI: Soft. Bowel sounds are normal. She exhibits no distension. There is no tenderness.  Has foley Musculoskeletal: She exhibits no edema.  Able to move all extremities   Lymphadenopathy:    She has no cervical adenopathy.  Neurological: She is alert.  Skin: Skin is warm and dry. She is not diaphoretic.  Psychiatric: She has a normal mood and affect.     ASSESSMENT/ PLAN:  1. Hypertension: will continue lisinopril 5 mg daily; and lopressor 25 mg twice daily and asa 325 mg daily   2. Diabetes: will continue lantus 25 units in the AM and  20 units in the PM; hgb a1c is 7.0  3. Depression: will continue colace daily prn;  miralax daily and senna s 2 tabs daily   4. Dyslipidemia: will continue lipitor 10 mg daily ldl is 71; will monitor  5. Osteoarthritis: pain is presently being managed; will continue percocet 5/325 mg every 4 hours as needed   6. Depression with psychosis: is presently stable is presently not taking medications; will monitor   7. Alzheimer's disease: no change in status; is  presently not taking medications will not make changes will monitor her status.  Her current weight is 163 pounds.   8. Nephrolithiasis; outlet obstruction: has foley; is followed by urology.   Will check cbc Will hold asa 5 days prior to procedure     Ok Edwards NP [facility]  Contact [phone] Monday through Friday 8am- 5pm  After hours call [phone]

## 2015-06-18 DIAGNOSIS — E875 Hyperkalemia: Secondary | ICD-10-CM | POA: Diagnosis not present

## 2015-06-18 DIAGNOSIS — E1165 Type 2 diabetes mellitus with hyperglycemia: Secondary | ICD-10-CM | POA: Diagnosis not present

## 2015-06-18 LAB — CBC AND DIFFERENTIAL
HCT: 37 % (ref 36–46)
Hemoglobin: 11.2 g/dL — AB (ref 12.0–16.0)
PLATELETS: 278 10*3/uL (ref 150–399)
WBC: 8.7 10*3/mL

## 2015-07-03 ENCOUNTER — Encounter (HOSPITAL_COMMUNITY): Payer: Self-pay | Admitting: *Deleted

## 2015-07-11 NOTE — Progress Notes (Addendum)
Preop instructions for: Ana Newman                  Date of Birth:  Jun 26, 1935                 Date of Procedure:  July 18, 2015     Doctor:  Dr Gaynelle Arabian  Time to arrive at [facility]:   8:00 am  Report to: [facility] 3rd Floor --   TAKE EAST ELEVATORS  Any procedure time changes, MD office will notify you!   Do not eat or drink past midnight the night before your procedure.(To include any tube feedings-must be discontinued)  Take these morning medications only with sips of water.(or give through gastrostomy or feeding tube).    METOPROLOL; LORATADINE (CLARITIN) IF NEEDED; MAY HAVE OXYCODONE-ACETAMINOPHEN IF NEEDED   TAKE 1/2 DOSE OF EVENING INSULIN NIGHT PRIOR TO SURGERY   Note: No Insulin or Diabetic meds should be given or taken the morning of the procedure!  FOLLOW CHLORAHEXADINE SHOWER INSTRUCTIONS NIGHT BEFORE AND MORNING OF SURGERY  Facility contact:  [place]             Phone:  [place]: NONE  Transportation contact phone#:[facility] [phone]   Please send day of procedure:current med list and meds last taken that day, confirm nothing by mouth status from what time, Patient Demographic info( to include DNR status, problem list, allergies)   RN contact name/phone#:  Sharlett Iles RN [phone]      and Fax #: [phone]  Bring Insurance card and picture ID Leave all jewelry and other valuables at place where living( no metal or rings to be worn) No contact lens Women-no make-up, no lotions,perfumes,powders Men-no colognes,lotions  Any questions day of procedure, call [facility] at [phone]   Sent from :[facility]                   [place]                   Fax:[phone]  Sent by :RN: Nash Dimmer RN BSN

## 2015-07-11 NOTE — Progress Notes (Signed)
Spoke with pts daughter Ammie Ferrier in regards to surgical date and time. She stated she would let her brother Lamanda Wahlman know of information. Instructions for day of surgery along with chlorahexadine shower instructions faxed to [facility]

## 2015-07-11 NOTE — Progress Notes (Signed)
Received confirmation / telephone from [place] with [facility] in regards to receiving surgical instructions for this pt. [place] stated had no further questions.

## 2015-07-16 ENCOUNTER — Encounter: Payer: Self-pay | Admitting: Adult Health

## 2015-07-16 ENCOUNTER — Non-Acute Institutional Stay (SKILLED_NURSING_FACILITY): Payer: Medicare Other | Admitting: Adult Health

## 2015-07-16 DIAGNOSIS — N200 Calculus of kidney: Secondary | ICD-10-CM | POA: Diagnosis not present

## 2015-07-16 DIAGNOSIS — F0280 Dementia in other diseases classified elsewhere without behavioral disturbance: Secondary | ICD-10-CM

## 2015-07-16 DIAGNOSIS — G308 Other Alzheimer's disease: Secondary | ICD-10-CM

## 2015-07-16 DIAGNOSIS — E034 Atrophy of thyroid (acquired): Secondary | ICD-10-CM

## 2015-07-16 DIAGNOSIS — N320 Bladder-neck obstruction: Secondary | ICD-10-CM | POA: Diagnosis not present

## 2015-07-16 DIAGNOSIS — E785 Hyperlipidemia, unspecified: Secondary | ICD-10-CM | POA: Diagnosis not present

## 2015-07-16 DIAGNOSIS — E038 Other specified hypothyroidism: Secondary | ICD-10-CM | POA: Diagnosis not present

## 2015-07-16 DIAGNOSIS — Z794 Long term (current) use of insulin: Secondary | ICD-10-CM | POA: Diagnosis not present

## 2015-07-16 DIAGNOSIS — E1169 Type 2 diabetes mellitus with other specified complication: Secondary | ICD-10-CM

## 2015-07-16 DIAGNOSIS — I10 Essential (primary) hypertension: Secondary | ICD-10-CM | POA: Diagnosis not present

## 2015-07-16 DIAGNOSIS — E119 Type 2 diabetes mellitus without complications: Secondary | ICD-10-CM | POA: Diagnosis not present

## 2015-07-16 NOTE — Progress Notes (Signed)
Patient ID: Ana Newman, female   DOB: 1935-04-03, 80 y.o.   MRN: [id number]   Location:  [facility] Room Number: 115-B Place of Service:  SNF (31)   CODE STATUS: Full Code  No Known Allergies  Chief Complaint  Patient presents with  . Medical Management of Chronic Issues    Follow up    HPI:  She is a long term resident of this facility being seen for the management of her chronic illnesses. She is awaiting her cystoscopy. She is not voicing any concerns or complaints at this time. There are no nursing concerns at this time.    Past Medical History  Diagnosis Date  . Reflux esophagitis   . Unspecified constipation   . Essential hypertension, benign   . Alzheimer's disease   . Type II or unspecified type diabetes mellitus without mention of complication, uncontrolled   . Rickets, active   . Unspecified psychosis   . Unspecified vitamin D deficiency   . Osteoarthritis   . Depression   . Hypothyroidism   . GERD (gastroesophageal reflux disease)   . Alzheimer's dementia   . Nephrolithiasis   . Foley catheter in place     Past Surgical History  Procedure Laterality Date  . Abdominal hysterectomy    . Joint replacement Left   . Cystoscopy w/ ureteral stent placement Bilateral 05/18/2015    Procedure: CYSTOSCOPY WITH RETROGRADE PYELOGRAM/URETERAL STENT PLACEMENT;  Surgeon: Carolan Clines, MD;  Location: [facility];  Service: Urology;  Laterality: Bilateral;    Social History   Social History  . Marital Status: Married    Spouse Name: N/A  . Number of Children: N/A  . Years of Education: N/A   Occupational History  . Not on file.   Social History Main Topics  . Smoking status: Never Smoker   . Smokeless tobacco: Never Used  . Alcohol Use: No  . Drug Use: No  . Sexual Activity: Not on file   Other Topics Concern  . Not on file   Social History Narrative   Family History  Problem Relation Age of Onset  . Family  history unknown: Yes      VITAL SIGNS BP 132/74 mmHg  Pulse 70  Temp(Src) 99.1 F (37.3 C) (Oral)  Resp 18  Ht 4\' 11"  (1.499 m)  Wt 163 lb (73.936 kg)  BMI 32.90 kg/m2  SpO2 99%  Patient's Medications  New Prescriptions   No medications on file  Previous Medications   ACETAMINOPHEN (TYLENOL) 325 MG TABLET    Take 650 mg by mouth every 6 (six) hours as needed for moderate pain.   ACETAMINOPHEN (TYLENOL) 500 MG TABLET    Take 1,000 mg by mouth daily.   ATORVASTATIN (LIPITOR) 10 MG TABLET    Take 10 mg by mouth daily at 6 PM.   BISACODYL (DULCOLAX) 5 MG EC TABLET    Take 10 mg by mouth daily as needed for moderate constipation.   CHOLECALCIFEROL (VITAMIN D) 1000 UNITS TABLET    Take 2,000 Units by mouth daily.   DOCUSATE SODIUM (COLACE) 100 MG CAPSULE    Take 100 mg by mouth daily as needed for mild constipation.    GAUZE PADS & DRESSINGS (HYDROCELL DRESSING 4"X4") 4"X4" PADS    Apply 1 application topically every 3 (three) days. Right buttocks   INSULIN GLARGINE (LANTUS) 100 UNIT/ML INJECTION    Inject 20-25 Units into the skin 2 (two) times daily. Take 25 units in  the AM and 20 units in the PM   LISINOPRIL (PRINIVIL,ZESTRIL) 2.5 MG TABLET    Take 2 tablets (5 mg total) by mouth daily.   LORATADINE (CLARITIN) 10 MG TABLET    Take 10 mg by mouth daily as needed for allergies.    METOPROLOL TARTRATE (LOPRESSOR) 25 MG TABLET    Take 25 mg by mouth 2 (two) times daily. For HTN   OXYCODONE-ACETAMINOPHEN (ROXICET) 5-325 MG TABLET    Take one tablet by mouth every 4 hours as needed for pain. DNE 3gm of APAP in 24 hours   POLYETHYLENE GLYCOL (MIRALAX / GLYCOLAX) PACKET    Take 17 g by mouth daily.    POLYVINYL ALCOHOL-POVIDONE (FRESHKOTE) 2.7-2 % SOLN    Apply 1 drop to eye 3 (three) times daily. To both eyes   SENNA-DOCUSATE (SENOKOT S) 8.6-50 MG PER TABLET    Take 2 tablets by mouth at bedtime.   Modified Medications   No medications on file  Discontinued Medications   ASPIRIN 325 MG  TABLET    Take 325 mg by mouth daily. Reported on 07/16/2015     SIGNIFICANT DIAGNOSTIC EXAMS  05-13-15: chest x-ray: no active disease is seen in chest  05-16-15: chest x-ray; Stable mild chronic bronchitic changes.  No acute abnormality.   05-18-15: chest x-ray: No acute abnormality.  Mild chronic interstitial lung disease.   05-18-15: ct of abdomen and pelvis: 1. Motion and hardware degraded images. 2. Moderate left-sided hydroureteronephrosis secondary to a distal left ureteric 11 x 13 mm calculus. Decreased left-sided renal function. 3. Large burden bilateral renal collecting system stones, including stones within both extrarenal pelves. 4. Cirrhosis with small volume ascites. 5. Proctitis, likely related to C. difficile colitis, given the clinical history. 6. Bladder wall irregularity which could relate to cystitis. Bladder diverticulum, suggesting a component of outlet obstruction. 7. Skin irregularity superficial to the coccyx. Recommend physical exam correlation to exclude decubitus ulcer. No gross osseous destruction.   LABS REVIEWED:     08-09-14: glucose 128; bun 13.9; creat 0.58; k+ 5.0; na++136; liver normal albumin 3.2; tsh 3.36;   Chol 205; ldl 108; trig 309; hdl 35  11-26-14: wbc 8.0; hgb 12.1; hct 39.0; mcv 95.7; plt 339; hgb a1c 7.1  12-11-14: vit d 37.32  02-05-15: chol 156; ldl 71; trig 207; hdl 44; tsh 5.01 03-06-15: wbc 8.9;  hgb 13.2; hct 41.1; mcv 93.8; plt 274; glucose 142; bun 15.2; creat 0.72; k+ 4.4; na++132; liver normal albumin 3.6; hgb a1c 7.3  05-14-15; wbc 15.6; hgb 11.1 ;hct 33.3; mcv 92.6; plt 243; glucose 84; bun 36; creat 1.45; k+ 4.0; na++134  Liver normal albumin 2.9  05-16-15: wbc 18.0; hgb 10.4; hct 31.4; mcv 93.2 ;plt 293; glucose 108; bun 26; creat 1.28; k+ 3.7; na++135; liver normal albumin 2.6; urine culture: no growth; blood culture: no growth 05-17-15: glucose 83; bun 23; creat 1.15; k+ 3.8; na++140  hgb a1c 7.0 05-19-15: wbc 20.2; hgb 9.0; hgb 27.3;  mcv 95.5; plt 309; glucose 58; bun 13; creat 1.01; k+ 4.3; na++13 05-21-15: wbc 13.5; hgb 9.3; hct 28.3; mcv 95.3; plt 433 05-26-15: glucose 123; bun 13; creat 0.7; k+ 5.0; na++ 137  06-18-15: wbc 8.7; hgb 11.2; hct 36.7; mcv 96.0; ;plt 278      Review of Systems Constitutional: Negative for appetite change and fatigue.  Respiratory: Negative for cough, chest tightness and shortness of breath.   Cardiovascular: Negative for chest pain, palpitations and leg swelling.  Gastrointestinal: Negative for  nausea, abdominal pain, diarrhea and constipation.  Musculoskeletal: Negative for myalgias and arthralgias.  Skin: Negative for pallor.  Psychiatric/Behavioral: The patient is not nervous/anxious.       Physical Exam Constitutional: No distress.  Eyes: Conjunctivae are normal.  Neck: Neck supple. No JVD present. No thyromegaly present.  Cardiovascular: Normal rate, regular rhythm and intact distal pulses.   Respiratory: Effort normal and breath sounds normal. No respiratory distress. She has no wheezes.  GI: Soft. Bowel sounds are normal. She exhibits no distension. There is no tenderness.  Has foley Musculoskeletal: She exhibits no edema.  Able to move all extremities   Lymphadenopathy:    She has no cervical adenopathy.  Neurological: She is alert.  Skin: Skin is warm and dry. She is not diaphoretic.  Psychiatric: She has a normal mood and affect.     ASSESSMENT/ PLAN:  1. Hypertension: will continue lisinopril 5 mg daily; and lopressor 25 mg twice daily   The asa 325 mg is being held for her upcoming cystoscopy.  2. Diabetes: will continue lantus 25 units in the AM and  20 units in the PM; hgb a1c is 7.0  3. Depression: will continue colace daily prn;  miralax daily and senna s 2 tabs daily   4. Dyslipidemia: will continue lipitor 10 mg daily ldl is 71; will monitor  5. Osteoarthritis: pain is presently being managed; will continue percocet 5/325 mg every 4 hours as needed     6. Depression with psychosis: is presently stable is presently not taking medications; will monitor   7. Alzheimer's disease: no change in status; is presently not taking medications will not make changes will monitor her status.  Her current weight is 163 pounds.   8. Nephrolithiasis; outlet obstruction: has foley; is followed by urology.     Ok Edwards NP [facility]  Contact [phone] Monday through Friday 8am- 5pm  After hours call [phone]

## 2015-07-18 ENCOUNTER — Ambulatory Visit (HOSPITAL_COMMUNITY)
Admission: RE | Admit: 2015-07-18 | Discharge: 2015-07-18 | Disposition: A | Discharge status: Skilled Nursing Facility | Payer: Medicare Other | Source: Ambulatory Visit | Attending: Urology | Admitting: Urology

## 2015-07-18 ENCOUNTER — Ambulatory Visit (HOSPITAL_COMMUNITY): Payer: Medicare Other | Admitting: Certified Registered Nurse Anesthetist

## 2015-07-18 ENCOUNTER — Encounter (HOSPITAL_COMMUNITY)
Admission: RE | Disposition: A | Discharge status: Skilled Nursing Facility | Payer: Self-pay | Source: Ambulatory Visit | Attending: Urology

## 2015-07-18 ENCOUNTER — Encounter (HOSPITAL_COMMUNITY): Payer: Self-pay | Admitting: *Deleted

## 2015-07-18 DIAGNOSIS — E559 Vitamin D deficiency, unspecified: Secondary | ICD-10-CM | POA: Insufficient documentation

## 2015-07-18 DIAGNOSIS — E785 Hyperlipidemia, unspecified: Secondary | ICD-10-CM | POA: Insufficient documentation

## 2015-07-18 DIAGNOSIS — K219 Gastro-esophageal reflux disease without esophagitis: Secondary | ICD-10-CM | POA: Diagnosis not present

## 2015-07-18 DIAGNOSIS — E039 Hypothyroidism, unspecified: Secondary | ICD-10-CM | POA: Insufficient documentation

## 2015-07-18 DIAGNOSIS — N132 Hydronephrosis with renal and ureteral calculous obstruction: Secondary | ICD-10-CM | POA: Diagnosis not present

## 2015-07-18 DIAGNOSIS — N200 Calculus of kidney: Secondary | ICD-10-CM | POA: Diagnosis not present

## 2015-07-18 DIAGNOSIS — M1990 Unspecified osteoarthritis, unspecified site: Secondary | ICD-10-CM | POA: Insufficient documentation

## 2015-07-18 DIAGNOSIS — Z794 Long term (current) use of insulin: Secondary | ICD-10-CM | POA: Insufficient documentation

## 2015-07-18 DIAGNOSIS — Z7982 Long term (current) use of aspirin: Secondary | ICD-10-CM | POA: Insufficient documentation

## 2015-07-18 DIAGNOSIS — E119 Type 2 diabetes mellitus without complications: Secondary | ICD-10-CM | POA: Diagnosis not present

## 2015-07-18 DIAGNOSIS — M419 Scoliosis, unspecified: Secondary | ICD-10-CM | POA: Insufficient documentation

## 2015-07-18 DIAGNOSIS — I10 Essential (primary) hypertension: Secondary | ICD-10-CM | POA: Insufficient documentation

## 2015-07-18 DIAGNOSIS — N201 Calculus of ureter: Secondary | ICD-10-CM | POA: Diagnosis not present

## 2015-07-18 DIAGNOSIS — N202 Calculus of kidney with calculus of ureter: Secondary | ICD-10-CM | POA: Diagnosis not present

## 2015-07-18 DIAGNOSIS — Z79899 Other long term (current) drug therapy: Secondary | ICD-10-CM | POA: Insufficient documentation

## 2015-07-18 DIAGNOSIS — N189 Chronic kidney disease, unspecified: Secondary | ICD-10-CM | POA: Diagnosis not present

## 2015-07-18 DIAGNOSIS — I129 Hypertensive chronic kidney disease with stage 1 through stage 4 chronic kidney disease, or unspecified chronic kidney disease: Secondary | ICD-10-CM | POA: Diagnosis not present

## 2015-07-18 DIAGNOSIS — F0390 Unspecified dementia without behavioral disturbance: Secondary | ICD-10-CM | POA: Diagnosis not present

## 2015-07-18 HISTORY — DX: Presence of urogenital implants: Z96.0

## 2015-07-18 HISTORY — DX: Calculus of kidney: N20.0

## 2015-07-18 HISTORY — DX: Major depressive disorder, single episode, unspecified: F32.9

## 2015-07-18 HISTORY — DX: Presence of other specified devices: Z97.8

## 2015-07-18 HISTORY — DX: Hypothyroidism, unspecified: E03.9

## 2015-07-18 HISTORY — PX: CYSTOSCOPY WITH RETROGRADE PYELOGRAM, URETEROSCOPY AND STENT PLACEMENT: SHX5789

## 2015-07-18 HISTORY — DX: Gastro-esophageal reflux disease without esophagitis: K21.9

## 2015-07-18 HISTORY — PX: HOLMIUM LASER APPLICATION: SHX5852

## 2015-07-18 HISTORY — DX: Depression, unspecified: F32.A

## 2015-07-18 LAB — CBC
HCT: 34.5 % — ABNORMAL LOW (ref 36.0–46.0)
Hemoglobin: 11.5 g/dL — ABNORMAL LOW (ref 12.0–15.0)
MCH: 30.3 pg (ref 26.0–34.0)
MCHC: 33.3 g/dL (ref 30.0–36.0)
MCV: 90.8 fL (ref 78.0–100.0)
Platelets: 278 10*3/uL (ref 150–400)
RBC: 3.8 MIL/uL — ABNORMAL LOW (ref 3.87–5.11)
RDW: 12.9 % (ref 11.5–15.5)
WBC: 10.2 10*3/uL (ref 4.0–10.5)

## 2015-07-18 LAB — BASIC METABOLIC PANEL
ANION GAP: 7 (ref 5–15)
BUN: 12 mg/dL (ref 6–20)
CALCIUM: 8.9 mg/dL (ref 8.9–10.3)
CO2: 25 mmol/L (ref 22–32)
Chloride: 105 mmol/L (ref 101–111)
Creatinine, Ser: 0.63 mg/dL (ref 0.44–1.00)
GFR calc Af Amer: >60 mL/min (ref >60)
GLUCOSE: 103 mg/dL — AB (ref 65–99)
Potassium: 4 mmol/L (ref 3.5–5.1)
SODIUM: 137 mmol/L (ref 135–145)

## 2015-07-18 LAB — GLUCOSE, CAPILLARY
GLUCOSE-CAPILLARY: 98 mg/dL (ref 65–99)
GLUCOSE-CAPILLARY: 87 mg/dL (ref 65–99)

## 2015-07-18 SURGERY — CYSTOURETEROSCOPY, WITH RETROGRADE PYELOGRAM AND STENT INSERTION
Anesthesia: General | Laterality: Bilateral

## 2015-07-18 MED ORDER — TRIMETHOPRIM 100 MG PO TABS: 100.0000 mg | ORAL_TABLET | Freq: Two times a day (BID) | ORAL | Status: DC

## 2015-07-18 MED ORDER — EPHEDRINE SULFATE 50 MG/ML IJ SOLN: INTRAMUSCULAR | Status: DC | PRN

## 2015-07-18 MED ORDER — FENTANYL CITRATE (PF) 100 MCG/2ML IJ SOLN
INTRAMUSCULAR | Status: AC
  Administered 2015-07-18: 25 ug via INTRAVENOUS
  Filled 2015-07-18: qty 2

## 2015-07-18 MED ORDER — IOPAMIDOL (ISOVUE-300) INJECTION 61%
INTRAVENOUS | Status: DC | PRN
  Administered 2015-07-18: 15 mL via URETHRAL

## 2015-07-18 MED ORDER — CEFAZOLIN SODIUM-DEXTROSE 2-4 GM/100ML-% IV SOLN
INTRAVENOUS | Status: AC
  Filled 2015-07-18: qty 100

## 2015-07-18 MED ORDER — EPHEDRINE SULFATE 50 MG/ML IJ SOLN
INTRAMUSCULAR | Status: DC | PRN
  Administered 2015-07-18 (×2): 10 mg via INTRAVENOUS

## 2015-07-18 MED ORDER — EPHEDRINE SULFATE 50 MG/ML IJ SOLN
INTRAMUSCULAR | Status: AC
  Filled 2015-07-18: qty 1

## 2015-07-18 MED ORDER — PHENYLEPHRINE HCL 10 MG/ML IJ SOLN
INTRAMUSCULAR | Status: DC | PRN
Start: 2015-07-18 — End: 2015-07-18
  Administered 2015-07-18 (×2): 80 ug via INTRAVENOUS

## 2015-07-18 MED ORDER — BELLADONNA ALKALOIDS-OPIUM 16.2-60 MG RE SUPP
RECTAL | Status: AC
  Filled 2015-07-18: qty 1

## 2015-07-18 MED ORDER — SODIUM CHLORIDE 0.9 % IJ SOLN
INTRAMUSCULAR | Status: AC
  Filled 2015-07-18: qty 10

## 2015-07-18 MED ORDER — ONDANSETRON HCL 4 MG/2ML IJ SOLN
INTRAMUSCULAR | Status: DC | PRN
  Administered 2015-07-18: 4 mg via INTRAVENOUS

## 2015-07-18 MED ORDER — LACTATED RINGERS IV SOLN
INTRAVENOUS | Status: DC | PRN
  Administered 2015-07-18: 13:00:00
  Administered 2015-07-18: 10:00:00 via INTRAVENOUS

## 2015-07-18 MED ORDER — SODIUM CHLORIDE 0.9 % IR SOLN
Status: DC | PRN
  Administered 2015-07-18: 1000 mL
  Administered 2015-07-18: 3000 mL

## 2015-07-18 MED ORDER — CEFAZOLIN SODIUM-DEXTROSE 2-4 GM/100ML-% IV SOLN
2.0000 g | INTRAVENOUS | Status: AC
  Administered 2015-07-18: 2 g via INTRAVENOUS
  Filled 2015-07-18: qty 100

## 2015-07-18 MED ORDER — ONDANSETRON HCL 4 MG/2ML IJ SOLN
INTRAMUSCULAR | Status: AC
  Filled 2015-07-18: qty 2

## 2015-07-18 MED ORDER — FENTANYL CITRATE (PF) 100 MCG/2ML IJ SOLN
INTRAMUSCULAR | Status: DC | PRN
  Administered 2015-07-18: 25 ug via INTRAVENOUS

## 2015-07-18 MED ORDER — LIDOCAINE HCL (CARDIAC) 20 MG/ML IV SOLN
INTRAVENOUS | Status: DC | PRN
  Administered 2015-07-18: 60 mg via INTRATRACHEAL

## 2015-07-18 MED ORDER — METOPROLOL TARTRATE 25 MG PO TABS
25.0000 mg | ORAL_TABLET | Freq: Once | ORAL | Status: AC
  Administered 2015-07-18: 25 mg via ORAL
  Filled 2015-07-18: qty 1

## 2015-07-18 MED ORDER — FENTANYL CITRATE (PF) 100 MCG/2ML IJ SOLN
INTRAMUSCULAR | Status: AC
  Filled 2015-07-18: qty 2

## 2015-07-18 MED ORDER — PROPOFOL 10 MG/ML IV BOLUS
INTRAVENOUS | Status: DC | PRN
  Administered 2015-07-18: 150 mg via INTRAVENOUS

## 2015-07-18 MED ORDER — PROPOFOL 10 MG/ML IV BOLUS
INTRAVENOUS | Status: AC
  Filled 2015-07-18: qty 20

## 2015-07-18 MED ORDER — FENTANYL CITRATE (PF) 100 MCG/2ML IJ SOLN
25.0000 ug | INTRAMUSCULAR | Status: DC | PRN
  Administered 2015-07-18 (×2): 25 ug via INTRAVENOUS

## 2015-07-18 MED ORDER — LIDOCAINE HCL (CARDIAC) 20 MG/ML IV SOLN
INTRAVENOUS | Status: AC
  Filled 2015-07-18: qty 5

## 2015-07-18 MED ORDER — BELLADONNA ALKALOIDS-OPIUM 16.2-60 MG RE SUPP
RECTAL | Status: DC | PRN
  Administered 2015-07-18: 1 via RECTAL

## 2015-07-18 MED ORDER — PHENAZOPYRIDINE HCL 200 MG PO TABS: 200.0000 mg | ORAL_TABLET | Freq: Three times a day (TID) | ORAL | Status: DC | PRN

## 2015-07-18 MED ORDER — PHENYLEPHRINE 40 MCG/ML (10ML) SYRINGE FOR IV PUSH (FOR BLOOD PRESSURE SUPPORT)
PREFILLED_SYRINGE | INTRAVENOUS | Status: AC
  Filled 2015-07-18: qty 10

## 2015-07-18 SURGICAL SUPPLY — 25 items
BAG URINE DRAINAGE (UROLOGICAL SUPPLIES) ×2 IMPLANT
BAG URO CATCHER STRL LF (MISCELLANEOUS) ×3 IMPLANT
BASKET STNLS GEMINI 4WIRE 3FR (BASKET) IMPLANT
BASKET ZERO TIP NITINOL 2.4FR (BASKET) IMPLANT
BSKT STON RTRVL GEM 120X11 3FR (BASKET)
BSKT STON RTRVL ZERO TP 2.4FR (BASKET)
CATH FOLEY 2WAY SLVR  5CC 16FR (CATHETERS) ×2
CATH FOLEY 2WAY SLVR 5CC 16FR (CATHETERS) IMPLANT
CATH INTERMIT  6FR 70CM (CATHETERS) ×3 IMPLANT
CATH URET DUAL LUMEN 6-10FR 50 (CATHETERS) ×3 IMPLANT
CLOTH BEACON ORANGE TIMEOUT ST (SAFETY) ×3 IMPLANT
FIBER LASER FLEXIVA 1000 (UROLOGICAL SUPPLIES) IMPLANT
FIBER LASER FLEXIVA 365 (UROLOGICAL SUPPLIES) ×2 IMPLANT
FIBER LASER FLEXIVA 550 (UROLOGICAL SUPPLIES) IMPLANT
FIBER LASER TRAC TIP (UROLOGICAL SUPPLIES) IMPLANT
GLOVE BIOGEL M STRL SZ7.5 (GLOVE) ×3 IMPLANT
GOWN STRL REUS W/TWL XL LVL3 (GOWN DISPOSABLE) ×3 IMPLANT
GUIDEWIRE STR DUAL SENSOR (WIRE) ×3 IMPLANT
IV NS 1000ML (IV SOLUTION) ×3
IV NS 1000ML BAXH (IV SOLUTION) ×1 IMPLANT
MANIFOLD NEPTUNE II (INSTRUMENTS) ×3 IMPLANT
PACK CYSTO (CUSTOM PROCEDURE TRAY) ×3 IMPLANT
STENT URET 6FRX24 CONTOUR (STENTS) ×4 IMPLANT
TUBING CONNECTING 10 (TUBING) ×2 IMPLANT
TUBING CONNECTING 10' (TUBING) ×1

## 2015-07-18 NOTE — Anesthesia Procedure Notes (Signed)
Procedure Name: LMA Insertion Date/Time: 07/18/2015 10:49 AM Performed by: Lajuana Carry E Pre-anesthesia Checklist: Patient identified, Emergency Drugs available, Suction available and Patient being monitored Patient Re-evaluated:Patient Re-evaluated prior to inductionOxygen Delivery Method: Circle system utilized Preoxygenation: Pre-oxygenation with 100% oxygen Intubation Type: IV induction Ventilation: Mask ventilation without difficulty LMA: LMA inserted LMA Size: 4.0 Number of attempts: 1 Placement Confirmation: positive ETCO2 and breath sounds checked- equal and bilateral Tube secured with: Tape Dental Injury: Teeth and Oropharynx as per pre-operative assessment

## 2015-07-18 NOTE — Op Note (Signed)
Pre-operative diagnosis : History of urosepsis with bilateral hydronephrosis; Left lower ureteral stone and left renal calculi; with indwelling bilateral double-J stents    Postoperative diagnosis:  Same   Operation:  Cystourethroscopy, removal of right double-J stent, right retro-pyelogram interpretation, replacement of right double-J stent (6 [place] by 24 cm; removal of left double-J stent, left retropyelogram with interpretation, identification of 1 cm left lower ureteral calculus with obstruction; ureteroscopy with laser fractionation of left lower ureteral calculus, basket extraction of left lower ureteral stone fragments, left pyeloscopy, left retropyelogram with identification of left mid pole calculus and left upper pole calyceal calculus (1 cm each)'s is left double-J stent (6 [place] by 24 7 m in (. Replacement of Foley catheter)   Surgeon:  S. Gaynelle Arabian, MD  First assistant:  None   Holmium laser (365 fiber, total energy equals 0.56 J; 0.5 J x rate of 5)  Anesthesia:  Gen. LMA   Preparation:  After appropriate pre-anesthesia, the patient was brought to the operative room, placed on the operative table in the dorsal supine position where general LMA anesthesia was introduced. She was then replaced in dorsal lithotomy position with pubis was prepped with Betadine solution and draped in the usual fashion. The patient was replaced in the dorsal lithotomy position, where she was noted to have severe rotatory scoliosis, and pelvic rotation. Her pelvis was essentially frozen, and it wasn't possible to get her into usual lithotomy position. Her hips were partially frozen, and I was only able to place her in partial lithotomy position. This allowed for reduced visibility and reduced ability for cystoscopy, and ureteroscopy. The history was reviewed. X-rays were reviewed.     Expand All Collapse All    Active Problems Problems  1. Obstruction of left ureteropelvic junction (UPJ) due to stone  (N20.1) 2. Obstruction of right ureteropelvic junction (UPJ) due to stone (N20.1)  History of Present Illness    80 yo diabetic female with dementia, resident at [facility], presents today by EMS for f/u after recent hospitalization for hx of bilateral UPJ obstruction due to kidney stones. There were no family members that accompanied her to her appointment today. She was initially seen on 05/18/15 in the hospital for 1mm Lt lower ureteral stone, 18.7 x 8.12mm Lt UPJ stone with hydronephrosis and renal scarring, multiple 3-2mm Lt renal cortical stones and an 7mm Rt UPJ stone with hydronephrosis. She is s/p cysto/bilateral RPG/bilateral JJ stent placement on 05/18/15.   Past Medical History Problems  1. History of Dementia (F03.90) 2. History of esophageal reflux (Z87.19) 3. History of hyperlipidemia (Z86.39) 4. History of hypertension (Z86.79) 5. History of hypothyroidism (Z86.39) 6. History of osteoarthritis (Z87.39) 7. History of type 2 diabetes mellitus (Z86.39) 8. History of Vitamin D deficiency (E55.9)          Routing History                                Statement of  Likelihood of Success: Excellent. TIME-OUT observed.:  Procedure:  Cystourethroscopy was accomplished, with difficulty identifying the urethral meatus, because of the extensive deformity of the pelvis, and twisted anatomy. However, the ureter was finally identified, and the bladder was drained of clear fluid. No bladder stones were identified. The double-J stents were identified. The right double-J was removed, with attempt to intubate the right double-J stent with a 0.038 guidewire. However, the guidewire was closed off with stone material. Therefore the guidewires removed.  A 6 French open-ended catheter was passed alongside the double-J stent, and right retropyelogram was performed, which showed no stone in the right ureter. Hydronephrosis was still identified. A 0.038 guidewire was passed and to the  right renal pelvis and coil. Over this, a 6 [place] by 24 similar double-J stent was passed and coiled in the bladder.  Attention was then directed to the left side, where the left double-J stent was identified. This was again brought through the urethra. Intubation was accomplished with a 0.038 guidewire, and the wire coiled in the renal pelvis. The double-J stent was removed. A left retro-pocket was performed, which showed a 1.5 cm stone in the left lower ureter. Additional retropyelogram showed stone in the left mid pole, and also the upper pole calyx. The wire itself tracked to the upper pole calyx.  The double bridge ureteroscope was passed through the bladder, and into the left ureteral orifice and into the left lower ureter. The stone was identified. Using the 365 laser fiber, with low energy of 0.5 J at a rate of 5, and 2J of 0.56, the stone was fragmented, and the fragments removed and taken for evaluation. After this, the remaining ureter was scoped, no further stone was identified. The renal pelvis was scope, noted to be an intrarenal pelvis. No further stone was identified. Did not see the stone in either the midpole, or the upper pole calyx. However, was unable to bend it. The scope into either the midpole infundibulum, where the upper pole calyx.  I elected to proceed with left double-J stent, and over the guidewire, passed a 6 [place] by 24 cm left double-J stent, coiled in the left upper pole, and in the bladder. The bladder seemed fluid. The patient tolerated procedure well. She was awakened and taken to recovery room in good condition. She received a B and O suppository.

## 2015-07-18 NOTE — Discharge Instructions (Signed)
Kidney Stones °Kidney stones (urolithiasis) are deposits that form inside your kidneys. The intense pain is caused by the stone moving through the urinary tract. When the stone moves, the ureter goes into spasm around the stone. The stone is usually passed in the urine.  °CAUSES  °· A disorder that makes certain neck glands produce too much parathyroid hormone (primary hyperparathyroidism). °· A buildup of uric acid crystals, similar to gout in your joints. °· Narrowing (stricture) of the ureter. °· A kidney obstruction present at birth (congenital obstruction). °· Previous surgery on the kidney or ureters. °· Numerous kidney infections. °SYMPTOMS  °· Feeling sick to your stomach (nauseous). °· Throwing up (vomiting). °· Blood in the urine (hematuria). °· Pain that usually spreads (radiates) to the groin. °· Frequency or urgency of urination. °DIAGNOSIS  °· Taking a history and physical exam. °· Blood or urine tests. °· CT scan. °· Occasionally, an examination of the inside of the urinary bladder (cystoscopy) is performed. °TREATMENT  °· Observation. °· Increasing your fluid intake. °· Extracorporeal shock wave lithotripsy--This is a noninvasive procedure that uses shock waves to break up kidney stones. °· Surgery may be needed if you have severe pain or persistent obstruction. There are various surgical procedures. Most of the procedures are performed with the use of small instruments. Only small incisions are needed to accommodate these instruments, so recovery time is minimized. °The size, location, and chemical composition are all important variables that will determine the proper choice of action for you. Talk to your health care provider to better understand your situation so that you will minimize the risk of injury to yourself and your kidney.  °HOME CARE INSTRUCTIONS  °· Drink enough water and fluids to keep your urine clear or pale yellow. This will help you to pass the stone or stone fragments. °· Strain  all urine through the provided strainer. Keep all particulate matter and stones for your health care provider to see. The stone causing the pain may be as small as a grain of salt. It is very important to use the strainer each and every time you pass your urine. The collection of your stone will allow your health care provider to analyze it and verify that a stone has actually passed. The stone analysis will often identify what you can do to reduce the incidence of recurrences. °· Only take over-the-counter or prescription medicines for pain, discomfort, or fever as directed by your health care provider. °· Keep all follow-up visits as told by your health care provider. This is important. °· Get follow-up X-rays if required. The absence of pain does not always mean that the stone has passed. It may have only stopped moving. If the urine remains completely obstructed, it can cause loss of kidney function or even complete destruction of the kidney. It is your responsibility to make sure X-rays and follow-ups are completed. Ultrasounds of the kidney can show blockages and the status of the kidney. Ultrasounds are not associated with any radiation and can be performed easily in a matter of minutes. °· Make changes to your daily diet as told by your health care provider. You may be told to: °¨ Limit the amount of salt that you eat. °¨ Eat 5 or more servings of fruits and vegetables each day. °¨ Limit the amount of meat, poultry, fish, and eggs that you eat. °· Collect a 24-hour urine sample as told by your health care provider. You may need to collect another urine sample every 6-12   months. °SEEK MEDICAL CARE IF: °· You experience pain that is progressive and unresponsive to any pain medicine you have been prescribed. °SEEK IMMEDIATE MEDICAL CARE IF:  °· Pain cannot be controlled with the prescribed medicine. °· You have a fever or shaking chills. °· The severity or intensity of pain increases over 18 hours and is not  relieved by pain medicine. °· You develop a new onset of abdominal pain. °· You feel faint or pass out. °· You are unable to urinate. °  °This information is not intended to replace advice given to you by your health care provider. Make sure you discuss any questions you have with your health care provider. °  °Document Released: 01/18/2005 Document Revised: 10/09/2014 Document Reviewed: 06/21/2012 °Elsevier Interactive Patient Education ©2016 [employer] ° °

## 2015-07-18 NOTE — Anesthesia Preprocedure Evaluation (Addendum)
Anesthesia Evaluation  Patient identified by MRN, date of birth, ID band Patient awake    Reviewed: Allergy & Precautions, H&P , NPO status , Patient's Chart, lab work & pertinent test results, reviewed documented beta blocker date and time   Airway Mallampati: II  TM Distance: >3 FB Neck ROM: Full    Dental no notable dental hx. (+) Teeth Intact, Dental Advisory Given, Partial Upper   Pulmonary neg pulmonary ROS,    Pulmonary exam normal breath sounds clear to auscultation       Cardiovascular hypertension, Pt. on medications and Pt. on home beta blockers  Rhythm:Regular Rate:Normal     Neuro/Psych Depression Schizophrenia negative neurological ROS     GI/Hepatic Neg liver ROS, GERD  Medicated and Controlled,  Endo/Other  diabetes, Type 1, Insulin DependentHypothyroidism   Renal/GU Renal disease  negative genitourinary   Musculoskeletal  (+) Arthritis , Osteoarthritis,    Abdominal   Peds  Hematology negative hematology ROS (+)   Anesthesia Other Findings   Reproductive/Obstetrics negative OB ROS                            Anesthesia Physical Anesthesia Plan  ASA: III  Anesthesia Plan: General   Post-op Pain Management:    Induction: Intravenous  Airway Management Planned: LMA  Additional Equipment:   Intra-op Plan:   Post-operative Plan: Extubation in OR  Informed Consent: I have reviewed the patients History and Physical, chart, labs and discussed the procedure including the risks, benefits and alternatives for the proposed anesthesia with the patient or authorized representative who has indicated his/her understanding and acceptance.   Dental advisory given  Plan Discussed with: CRNA  Anesthesia Plan Comments:         Anesthesia Quick Evaluation

## 2015-07-18 NOTE — H&P (Signed)
Reason For [facility] f/u   Active Problems Problems  1. Obstruction of left ureteropelvic junction (UPJ) due to stone (N20.1) 2. Obstruction of right ureteropelvic junction (UPJ) due to stone (N20.1)  History of Present Illness     80 yo diabetic female with dementia, resident at [facility], presents today by EMS for f/u after recent hospitalization for hx of bilateral UPJ obstruction due to kidney stones. There were no family members that accompanied her to her appointment today. She was initially seen on 05/18/15 in the hospital for 84mm Lt lower ureteral stone, 18.7 x 8.65mm Lt UPJ stone with hydronephrosis and renal scarring, multiple 3-37mm Lt renal cortical stones and an 56mm Rt UPJ stone with hydronephrosis. She is s/p cysto/bilateral RPG/bilateral JJ stent placement on 05/18/15.   Past Medical History Problems  1. History of Dementia (F03.90) 2. History of esophageal reflux (Z87.19) 3. History of hyperlipidemia (Z86.39) 4. History of hypertension (Z86.79) 5. History of hypothyroidism (Z86.39) 6. History of osteoarthritis (Z87.39) 7. History of type 2 diabetes mellitus (Z86.39) 8. History of Vitamin D deficiency (E55.9)  Surgical History Problems  1. History of Appendectomy  Current Meds 1. Acetaminophen CAPS;  Therapy: (Recorded:24Apr2017) to Recorded 2. Aspirin 325 MG Oral Tablet;  Therapy: (Recorded:25Apr2017) to Recorded 3. Atorvastatin Calcium 10 MG Oral Tablet;  Therapy: (Recorded:24Apr2017) to Recorded 4. Biotene Dry Mouth LIQD;  Therapy: (Recorded:25Apr2017) to Recorded 5. Bisacodyl TBEC;  Therapy: (Recorded:24Apr2017) to Recorded 6. Colace 100 MG Oral Capsule;  Therapy: (Recorded:24Apr2017) to Recorded 7. Lantus SoloStar 100 UNIT/ML SOLN;  Therapy: (Recorded:25Apr2017) to Recorded 8. Lisinopril 2.5 MG Oral Tablet;  Therapy: (Recorded:24Apr2017) to Recorded 9. Loratadine 10 MG Oral Tablet;  Therapy: (Recorded:24Apr2017) to Recorded 10. Metoprolol  Tartrate 25 MG Oral Tablet;   Therapy: (Recorded:24Apr2017) to Recorded 11. MiraLax POWD;   Therapy: (Recorded:24Apr2017) to Recorded 12. Polyvinyl Alcohol 1.4 % Ophthalmic Solution;   Therapy: (Recorded:24Apr2017) to Recorded 13. Senna TABS;   Therapy: (Recorded:24Apr2017) to Recorded 14. Systane SOLN;   Therapy: (Recorded:25Apr2017) to Recorded 15. Vitamin D 2000 UNIT Oral Tablet;   Therapy: (Recorded:24Apr2017) to Recorded  Allergies Medication  1. No Known Drug Allergies  Social History Problems  1. Never a smoker 2. No alcohol use  Review of Systems Genitourinary, constitutional, skin, eye, otolaryngeal, hematologic/lymphatic, cardiovascular, pulmonary, endocrine, musculoskeletal, gastrointestinal, neurological and psychiatric system(s) were reviewed and pertinent findings if present are noted and are otherwise negative.    Vitals Vital Signs [Data Includes: Last 1 Day]  Recorded: 25Apr2017 09:56AM  Blood Pressure: 104 / 47 Temperature: 98.2 F Heart Rate: 77  Physical Exam Constitutional: Well nourished and well developed . No acute distress. The patient appears well hydrated. Patient seen on gurney. She is awake and alert and oriented.  ENT:. The ears and nose are normal in appearance.  Neck: The appearance of the neck is normal.  Pulmonary: No respiratory distress.  Cardiovascular:. No peripheral edema.  Abdomen: The abdomen is obese, but not distended. Bowel sounds are normal.  Lymphatics: The femoral and inguinal nodes are not enlarged or tender.  Skin: Normal skin turgor, no visible rash and no visible skin lesions.  Neuro/Psych:. Mood and affect are appropriate.    Assessment Assessed  1. Obstruction of left ureteropelvic junction (UPJ) due to stone (N20.1) 2. Obstruction of right ureteropelvic junction (UPJ) due to stone (N20.1)  I have reviewed all records today, and all her CT's.She is making normal conversation, and seems to be in normal command of her  memory. She needs cysto, and Left  retrograde pyelogram, and ureteroscopy and laser fragmentation of L ureteral stone and pyeloscopy and stone removal, and JJ with string for temporary placement; and Right JJ removal, retrograde and possible replacement.   Plan Obstruction of left ureteropelvic junction (UPJ) due to stone, Obstruction of right ureteropelvic junction (UPJ) due to stone  1. Start: Oxycodone-Acetaminophen 5-325 MG Oral Tablet; TAKE 1 TABLET Every  4 hours  PRN pain 2. Follow-up Schedule Surgery Office  Follow-up  Status: Hold For - Appointment   Requested for: 25Apr2017  1. proposed surgery i 1 month  2. Note to [facility]  3. Ask for [facility] MD notes and ask for any family to come with patient ( she insists that she signs all her own papers).   Signatures

## 2015-07-18 NOTE — Progress Notes (Signed)
Report to [place].

## 2015-07-18 NOTE — Progress Notes (Signed)
Report called to Lattie Haw at [facility]. Report given to [facility]. Reviewed procedure and discharge instructions.

## 2015-07-18 NOTE — Transfer of Care (Signed)
Immediate Anesthesia Transfer of Care Note  Patient: Ana Newman  Procedure(s) Performed: Procedure(s): CYSTOSCOPY REMOVE LEFT DOUBLE J STENT LEFT RETROGRADE PYELOGRAM, LEFT URETEROSCOPY, PYELOSCOPY, REPLACEMENT OF LEFT DOUBLE J STENT (Bilateral) HOLMIUM LASER OF LEFT LOWER URETERAL STONE, LEFT PYELOSCOPY, REPLACE LEFT DOUBLE J STENT REMOVE RIGHT DOUBLE J STENT RIGHT RETROGRADE PYELOGRAM, REPLACEMENT RIGHT DOUBLE J STENT  (Bilateral)  Patient Location: PACU  Anesthesia Type:General  Level of Consciousness: awake and alert   Airway & Oxygen Therapy: Patient Spontanous Breathing  Post-op Assessment: Report given to RN and Post -op Vital signs reviewed and stable  Post vital signs: Reviewed and stable  Last Vitals:  Filed Vitals:   07/18/15 0835  BP: 151/93  Pulse: 70  Temp: 37.3 C  Resp: 18    Last Pain: There were no vitals filed for this visit.       Complications: No apparent anesthesia complications

## 2015-07-18 NOTE — Progress Notes (Signed)
Pt is noted to have small red wd on right buttocks, noted upon arrival to short stay.

## 2015-07-18 NOTE — Anesthesia Postprocedure Evaluation (Signed)
Anesthesia Post Note  Patient: Ana Newman  Procedure(s) Performed: Procedure(s) (LRB): CYSTOSCOPY REMOVE LEFT DOUBLE J STENT LEFT RETROGRADE PYELOGRAM, LEFT URETEROSCOPY, PYELOSCOPY, REPLACEMENT OF LEFT DOUBLE J STENT (Bilateral) HOLMIUM LASER OF LEFT LOWER URETERAL STONE, LEFT PYELOSCOPY, REPLACE LEFT DOUBLE J STENT REMOVE RIGHT DOUBLE J STENT RIGHT RETROGRADE PYELOGRAM, REPLACEMENT RIGHT DOUBLE J STENT  (Bilateral)  Patient location during evaluation: PACU Anesthesia Type: General Level of consciousness: awake and alert Pain management: pain level controlled Vital Signs Assessment: post-procedure vital signs reviewed and stable Respiratory status: spontaneous breathing, nonlabored ventilation, respiratory function stable and patient connected to nasal cannula oxygen Cardiovascular status: blood pressure returned to baseline and stable Postop Assessment: no signs of nausea or vomiting Anesthetic complications: no    Last Vitals:  Filed Vitals:   07/18/15 1300 07/18/15 1318  BP: 130/70 144/71  Pulse: 75   Temp: 36.7 C 36.9 C  Resp: 17 16    Last Pain:  Filed Vitals:   07/18/15 1320  PainSc: 2                  Deontrae Drinkard,W. EDMOND

## 2015-07-18 NOTE — Interval H&P Note (Signed)
History and Physical Interval Note:  07/18/2015 10:26 AM  Jessica Priest  has presented today for surgery, with the diagnosis of BILATERAL RENAL AND URETERAL STONE   The various methods of treatment have been discussed with the patient and family. After consideration of risks, benefits and other options for treatment, the patient has consented to  Procedure(s): CYSTOSCOPY REMOVE LEFT DOUBLE J STENT LEFT RETROGRADE PYELOGRAM, LEFT URETEROSCOPY  (Bilateral) HOLMIUM LASER OF LEFT MID URETERAL STONE LEFT PYELOSCOPY POSSIBLE LASER LEFT RENAL PELVIC STONE REPLACE LEFT DOUBLE J STENT REMOVE RIGHT DOUBLE J STENT RIGHT RETROGRADE PYELOGRAM POSSIBLE REPLACEMENT RIGHT DOUBLE J STENT  (Bilateral) as a surgical intervention .  The patient's history has been reviewed, patient examined, no change in status, stable for surgery.  I have reviewed the patient's chart and labs.  Questions were answered to the patient's satisfaction.     Xela Oregel I Alys Dulak

## 2015-07-28 ENCOUNTER — Other Ambulatory Visit: Payer: Self-pay | Admitting: *Deleted

## 2015-07-28 MED ORDER — OXYCODONE-ACETAMINOPHEN 5-325 MG PO TABS: ORAL_TABLET | ORAL | Status: DC

## 2015-07-28 NOTE — Telephone Encounter (Signed)
[facility]

## 2015-08-22 ENCOUNTER — Encounter: Payer: Self-pay | Admitting: Adult Health

## 2015-08-22 ENCOUNTER — Non-Acute Institutional Stay (SKILLED_NURSING_FACILITY): Payer: Medicare Other | Admitting: Adult Health

## 2015-08-22 DIAGNOSIS — G308 Other Alzheimer's disease: Secondary | ICD-10-CM | POA: Diagnosis not present

## 2015-08-22 DIAGNOSIS — E785 Hyperlipidemia, unspecified: Secondary | ICD-10-CM | POA: Diagnosis not present

## 2015-08-22 DIAGNOSIS — E1169 Type 2 diabetes mellitus with other specified complication: Secondary | ICD-10-CM | POA: Diagnosis not present

## 2015-08-22 DIAGNOSIS — F0280 Dementia in other diseases classified elsewhere without behavioral disturbance: Secondary | ICD-10-CM | POA: Diagnosis not present

## 2015-08-22 DIAGNOSIS — F329 Major depressive disorder, single episode, unspecified: Secondary | ICD-10-CM | POA: Diagnosis not present

## 2015-08-22 DIAGNOSIS — N320 Bladder-neck obstruction: Secondary | ICD-10-CM

## 2015-08-22 DIAGNOSIS — Z794 Long term (current) use of insulin: Secondary | ICD-10-CM

## 2015-08-22 DIAGNOSIS — I10 Essential (primary) hypertension: Secondary | ICD-10-CM | POA: Diagnosis not present

## 2015-08-22 DIAGNOSIS — M150 Primary generalized (osteo)arthritis: Secondary | ICD-10-CM

## 2015-08-22 DIAGNOSIS — E038 Other specified hypothyroidism: Secondary | ICD-10-CM

## 2015-08-22 DIAGNOSIS — E119 Type 2 diabetes mellitus without complications: Secondary | ICD-10-CM | POA: Diagnosis not present

## 2015-08-22 DIAGNOSIS — F32A Depression, unspecified: Secondary | ICD-10-CM

## 2015-08-22 DIAGNOSIS — E034 Atrophy of thyroid (acquired): Secondary | ICD-10-CM

## 2015-08-22 DIAGNOSIS — M159 Polyosteoarthritis, unspecified: Secondary | ICD-10-CM

## 2015-08-22 NOTE — Progress Notes (Signed)
Patient ID: Ana Newman, female   DOB: 05-Feb-1935, 80 y.o.   MRN: [id number]    Location:   [place] Room Number: 115-B Place of Service:  SNF (31)   CODE STATUS: Full Code  No Known Allergies  Chief Complaint  Patient presents with  . Medical Management of Chronic Issues    Follow up    HPI:  She is a long term resident of this facility being seen for the management of her chronic illnesses. Overall there is little change in her status. She is not voicing any complaints or concerns at this time; telling that she feels good. There are no nursing concerns at this time.    Past Medical History  Diagnosis Date  . Reflux esophagitis   . Unspecified constipation   . Essential hypertension, benign   . Alzheimer's disease   . Type II or unspecified type diabetes mellitus without mention of complication, uncontrolled   . Rickets, active   . Unspecified psychosis   . Unspecified vitamin D deficiency   . Osteoarthritis   . Depression   . Hypothyroidism   . GERD (gastroesophageal reflux disease)   . Alzheimer's dementia   . Nephrolithiasis   . Foley catheter in place     Past Surgical History  Procedure Laterality Date  . Abdominal hysterectomy    . Joint replacement Left   . Cystoscopy w/ ureteral stent placement Bilateral 05/18/2015    Procedure: CYSTOSCOPY WITH RETROGRADE PYELOGRAM/URETERAL STENT PLACEMENT;  Surgeon: Carolan Clines, MD;  Location: [facility];  Service: Urology;  Laterality: Bilateral;  . Cystoscopy with retrograde pyelogram, ureteroscopy and stent placement Bilateral 07/18/2015    Procedure: CYSTOSCOPY REMOVE LEFT DOUBLE J STENT LEFT RETROGRADE PYELOGRAM, LEFT URETEROSCOPY, PYELOSCOPY, REPLACEMENT OF LEFT DOUBLE J STENT;  Surgeon: Carolan Clines, MD;  Location: [facility];  Service: Urology;  Laterality: Bilateral;  . Holmium laser application Bilateral [id number]    Procedure: HOLMIUM LASER OF LEFT LOWER URETERAL STONE, LEFT PYELOSCOPY,  REPLACE LEFT DOUBLE J STENT REMOVE RIGHT DOUBLE J STENT RIGHT RETROGRADE PYELOGRAM, REPLACEMENT RIGHT DOUBLE J STENT ;  Surgeon: Carolan Clines, MD;  Location: [facility];  Service: Urology;  Laterality: Bilateral;    Social History   Social History  . Marital Status: Married    Spouse Name: N/A  . Number of Children: N/A  . Years of Education: N/A   Occupational History  . Not on file.   Social History Main Topics  . Smoking status: Never Smoker   . Smokeless tobacco: Never Used  . Alcohol Use: No  . Drug Use: No  . Sexual Activity: Not on file   Other Topics Concern  . Not on file   Social History Narrative   Family History  Problem Relation Age of Onset  . Family history unknown: Yes      VITAL SIGNS BP 125/94 mmHg  Pulse 77  Temp(Src) 98 F (36.7 C) (Oral)  Resp 18  Ht 4\' 11"  (1.499 m)  Wt 262 lb (118.842 kg)  BMI 52.89 kg/m2  SpO2 98%  Patient's Medications  New Prescriptions   No medications on file  Previous Medications   ACETAMINOPHEN (TYLENOL) 325 MG TABLET    Take 650 mg by mouth every 6 (six) hours as needed for moderate pain.   ACETAMINOPHEN (TYLENOL) 500 MG TABLET    Take 1,000 mg by mouth daily.   ASPIRIN 325 MG EC TABLET    Take 325 mg by mouth daily.   ATORVASTATIN (LIPITOR)  10 MG TABLET    Take 10 mg by mouth daily at 6 PM.   BISACODYL (DULCOLAX) 5 MG EC TABLET    Take 10 mg by mouth daily as needed for moderate constipation.   CHOLECALCIFEROL (VITAMIN D) 1000 UNITS TABLET    Take 2,000 Units by mouth daily.   DOCUSATE SODIUM (COLACE) 100 MG CAPSULE    Take 100 mg by mouth daily as needed for mild constipation.    GAUZE PADS & DRESSINGS (HYDROCELL DRESSING 4"X4") 4"X4" PADS    Apply 1 application topically every 3 (three) days. Right buttocks   INSULIN GLARGINE (LANTUS) 100 UNIT/ML INJECTION    Inject 20-25 Units into the skin 2 (two) times daily. Take 25 units in the AM and 20 units in the PM   LISINOPRIL (PRINIVIL,ZESTRIL) 2.5 MG TABLET     Take 2 tablets (5 mg total) by mouth daily.   LORATADINE (CLARITIN) 10 MG TABLET    Take 10 mg by mouth daily as needed for allergies.    METOPROLOL TARTRATE (LOPRESSOR) 25 MG TABLET    Take 25 mg by mouth 2 (two) times daily. For HTN   OXYCODONE-ACETAMINOPHEN (ROXICET) 5-325 MG TABLET    Take one tablet by mouth every 4 hours as needed for pain. DNE 3gm of APAP in 24 hours   PHENAZOPYRIDINE (PYRIDIUM) 200 MG TABLET    Take 1 tablet (200 mg total) by mouth 3 (three) times daily as needed for pain (note: discolor urine, stain clothing).   POLYETHYLENE GLYCOL (MIRALAX / GLYCOLAX) PACKET    Take 17 g by mouth daily.    POLYVINYL ALCOHOL-POVIDONE (FRESHKOTE) 2.7-2 % SOLN    Apply 1 drop to eye 3 (three) times daily. To both eyes   SENNA-DOCUSATE (SENOKOT S) 8.6-50 MG PER TABLET    Take 2 tablets by mouth at bedtime.   Modified Medications   No medications on file  Discontinued Medications   TRIMETHOPRIM (TRIMPEX) 100 MG TABLET    Take 1 tablet (100 mg total) by mouth 2 (two) times daily.     SIGNIFICANT DIAGNOSTIC EXAMS  05-13-15: chest x-ray: no active disease is seen in chest  05-16-15: chest x-ray; Stable mild chronic bronchitic changes.  No acute abnormality.   05-18-15: chest x-ray: No acute abnormality.  Mild chronic interstitial lung disease.   05-18-15: ct of abdomen and pelvis: 1. Motion and hardware degraded images. 2. Moderate left-sided hydroureteronephrosis secondary to a distal left ureteric 11 x 13 mm calculus. Decreased left-sided renal function. 3. Large burden bilateral renal collecting system stones, including stones within both extrarenal pelves. 4. Cirrhosis with small volume ascites. 5. Proctitis, likely related to C. difficile colitis, given the clinical history. 6. Bladder wall irregularity which could relate to cystitis. Bladder diverticulum, suggesting a component of outlet obstruction. 7. Skin irregularity superficial to the coccyx. Recommend physical exam correlation  to exclude decubitus ulcer. No gross osseous destruction.   LABS REVIEWED:   11-26-14: wbc 8.0; hgb 12.1; hct 39.0; mcv 95.7; plt 339; hgb a1c 7.1  12-11-14: vit d 37.32  02-05-15: chol 156; ldl 71; trig 207; hdl 44; tsh 5.01 03-06-15: wbc 8.9;  hgb 13.2; hct 41.1; mcv 93.8; plt 274; glucose 142; bun 15.2; creat 0.72; k+ 4.4; na++132; liver normal albumin 3.6; hgb a1c 7.3  05-14-15; wbc 15.6; hgb 11.1 ;hct 33.3; mcv 92.6; plt 243; glucose 84; bun 36; creat 1.45; k+ 4.0; na++134  Liver normal albumin 2.9  05-16-15: wbc 18.0; hgb 10.4; hct 31.4; mcv 93.2 ;  plt 293; glucose 108; bun 26; creat 1.28; k+ 3.7; na++135; liver normal albumin 2.6; urine culture: no growth; blood culture: no growth 05-17-15: glucose 83; bun 23; creat 1.15; k+ 3.8; na++140  hgb a1c 7.0 05-19-15: wbc 20.2; hgb 9.0; hgb 27.3; mcv 95.5; plt 309; glucose 58; bun 13; creat 1.01; k+ 4.3; na++13 05-21-15: wbc 13.5; hgb 9.3; hct 28.3; mcv 95.3; plt 433 05-26-15: glucose 123; bun 13; creat 0.7; k+ 5.0; na++ 137  06-18-15: wbc 8.7; hgb 11.2; hct 36.7; mcv 96.0; ;plt 278      Review of Systems Constitutional: Negative for appetite change and fatigue.  Respiratory: Negative for cough, chest tightness and shortness of breath.   Cardiovascular: Negative for chest pain, palpitations and leg swelling.  Gastrointestinal: Negative for nausea, abdominal pain, diarrhea and constipation.  Musculoskeletal: Negative for myalgias and arthralgias.  Skin: Negative for pallor.  Psychiatric/Behavioral: The patient is not nervous/anxious.       Physical Exam Constitutional: No distress.  Eyes: Conjunctivae are normal.  Neck: Neck supple. No JVD present. No thyromegaly present.  Cardiovascular: Normal rate, regular rhythm and intact distal pulses.   Respiratory: Effort normal and breath sounds normal. No respiratory distress. She has no wheezes.  GI: Soft. Bowel sounds are normal. She exhibits no distension. There is no tenderness.  Has  foley Musculoskeletal: She exhibits no edema.  Able to move all extremities   Lymphadenopathy:    She has no cervical adenopathy.  Neurological: She is alert.  Skin: Skin is warm and dry. She is not diaphoretic.  Psychiatric: She has a normal mood and affect.     ASSESSMENT/ PLAN:  1. Hypertension: will continue lisinopril 5 mg daily; and lopressor 25 mg twice daily   The asa 325 mg is being held for her upcoming cystoscopy.  2. Diabetes: will continue lantus 25 units in the AM and  20 units in the PM; hgb a1c is 7.0  3. Depression: will continue colace daily prn;  miralax daily and senna s 2 tabs daily   4. Dyslipidemia: will continue lipitor 10 mg daily ldl is 71; will monitor  5. Osteoarthritis: pain is presently being managed; will continue percocet 5/325 mg every 4 hours as needed   6. Depression with psychosis: is presently stable is presently not taking medications; will monitor   7. Alzheimer's disease: no change in status; is presently not taking medications will not make changes will monitor her status.  Her current weight is 163 pounds.   8. Nephrolithiasis; outlet obstruction: has foley; is followed by urology.    Ok Edwards NP [facility]  Contact [phone] Monday through Friday 8am- 5pm  After hours call [phone]

## 2015-08-26 DIAGNOSIS — Z79899 Other long term (current) drug therapy: Secondary | ICD-10-CM | POA: Diagnosis not present

## 2015-08-27 ENCOUNTER — Ambulatory Visit (HOSPITAL_COMMUNITY)
Admission: RE | Admit: 2015-08-27 | Discharge: 2015-08-27 | Disposition: A | Discharge status: Home / Self Care | Payer: Medicare Other | Source: Ambulatory Visit | Attending: Nurse Practitioner | Admitting: Nurse Practitioner

## 2015-08-27 ENCOUNTER — Other Ambulatory Visit (HOSPITAL_COMMUNITY): Payer: Self-pay | Admitting: Nurse Practitioner

## 2015-08-27 DIAGNOSIS — N201 Calculus of ureter: Secondary | ICD-10-CM | POA: Diagnosis present

## 2015-08-27 DIAGNOSIS — Z960 Presence of urogenital implants: Secondary | ICD-10-CM | POA: Insufficient documentation

## 2015-08-27 DIAGNOSIS — N50819 Testicular pain, unspecified: Secondary | ICD-10-CM | POA: Diagnosis not present

## 2015-08-27 DIAGNOSIS — N200 Calculus of kidney: Secondary | ICD-10-CM | POA: Diagnosis not present

## 2015-08-27 DIAGNOSIS — N202 Calculus of kidney with calculus of ureter: Secondary | ICD-10-CM | POA: Diagnosis not present

## 2015-08-27 DIAGNOSIS — R531 Weakness: Secondary | ICD-10-CM | POA: Diagnosis not present

## 2015-09-01 ENCOUNTER — Other Ambulatory Visit: Payer: Self-pay | Admitting: Urology

## 2015-09-04 ENCOUNTER — Encounter: Payer: Self-pay | Admitting: Internal Medicine

## 2015-09-04 ENCOUNTER — Non-Acute Institutional Stay (SKILLED_NURSING_FACILITY): Payer: Medicare Other | Admitting: Internal Medicine

## 2015-09-04 DIAGNOSIS — E119 Type 2 diabetes mellitus without complications: Secondary | ICD-10-CM | POA: Diagnosis not present

## 2015-09-04 DIAGNOSIS — Z794 Long term (current) use of insulin: Secondary | ICD-10-CM | POA: Diagnosis not present

## 2015-09-04 DIAGNOSIS — N320 Bladder-neck obstruction: Secondary | ICD-10-CM | POA: Diagnosis not present

## 2015-09-04 DIAGNOSIS — E785 Hyperlipidemia, unspecified: Secondary | ICD-10-CM

## 2015-09-04 DIAGNOSIS — N200 Calculus of kidney: Secondary | ICD-10-CM | POA: Diagnosis not present

## 2015-09-04 NOTE — Progress Notes (Addendum)
MRN: [id number] Name: Ana Newman  Sex: female Age: 80 y.o. DOB: 19-Jul-1935  [place] #:  Facility/Room: [facility] / 115 B Level Of Care: SNF Provider: Noah Delaine. Sheppard Coil, MD Emergency Contacts: Extended Emergency Contact Information Primary Emergency Contact: Smith,Christie Address: [address] [address] [place] Phone: [phone] Relation: None Secondary Emergency Contact: Trapani,Harold  [place] Mobile Phone: [phone] Relation: Son  Code Status: Full Code  Allergies: Review of patient's allergies indicates no known allergies.  Chief Complaint  Patient presents with  . Medical Management of Chronic Issues    Routine Visit    HPI: Patient is 80 y.o. female who is being seen for routine issues of nephrolithiasis with outlet obstruction, DM2 and HLD.  Past Medical History:  Diagnosis Date  . Acute pyelonephritis   . AKI (acute kidney injury) ([place])   . Alzheimer's dementia   . Alzheimer's disease   . Bladder outlet obstruction   . Depression   . Diarrhea   . Dyslipidemia   . Environmental and seasonal allergies   . Essential hypertension, benign   . Foley catheter in place   . GERD (gastroesophageal reflux disease)   . History of urinary tract infection   . Hypertension   . Hypothyroidism   . Insomnia   . Nephrolithiasis   . Osteoarthritis   . Reflux esophagitis   . Rickets, active   . Sepsis ([place])   . Type II or unspecified type diabetes mellitus without mention of complication, uncontrolled   . Unspecified constipation   . Unspecified psychosis   . Unspecified vitamin D deficiency     Past Surgical History:  Procedure Laterality Date  . ABDOMINAL HYSTERECTOMY    . CYSTOSCOPY W/ URETERAL STENT PLACEMENT Bilateral 05/18/2015   Procedure: CYSTOSCOPY WITH RETROGRADE PYELOGRAM/URETERAL STENT PLACEMENT;  Surgeon: Carolan Clines, MD;  Location: [facility];  Service: Urology;  Laterality: Bilateral;   . CYSTOSCOPY WITH RETROGRADE PYELOGRAM, URETEROSCOPY AND STENT PLACEMENT Bilateral 07/18/2015   Procedure: CYSTOSCOPY REMOVE LEFT DOUBLE J STENT LEFT RETROGRADE PYELOGRAM, LEFT URETEROSCOPY, PYELOSCOPY, REPLACEMENT OF LEFT DOUBLE J STENT;  Surgeon: Carolan Clines, MD;  Location: [facility];  Service: Urology;  Laterality: Bilateral;  . HOLMIUM LASER APPLICATION Bilateral [id number]   Procedure: HOLMIUM LASER OF LEFT LOWER URETERAL STONE, LEFT PYELOSCOPY, REPLACE LEFT DOUBLE J STENT REMOVE RIGHT DOUBLE J STENT RIGHT RETROGRADE PYELOGRAM, REPLACEMENT RIGHT DOUBLE J STENT ;  Surgeon: Carolan Clines, MD;  Location: [facility];  Service: Urology;  Laterality: Bilateral;  . JOINT REPLACEMENT Left       Medication List       Accurate as of 09/04/15 11:59 PM. Always use your most recent med list.          acetaminophen 500 MG tablet Commonly known as:  TYLENOL Take 1,000 mg by mouth daily. Pain management   acetaminophen 325 MG tablet Commonly known as:  TYLENOL Take 650 mg by mouth every 6 (six) hours as needed for moderate pain.   aspirin 325 MG EC tablet Take 325 mg by mouth daily.   atorvastatin 10 MG tablet Commonly known as:  LIPITOR Take 10 mg by mouth daily at 6 PM.   bisacodyl 5 MG EC tablet Commonly known as:  DULCOLAX Take 10 mg by mouth daily as needed for moderate constipation.   CALAZIME SKIN PROTECTANT EX Apply 1 application topically 2 (two) times daily.   cholecalciferol 1000 units tablet Commonly known  as:  VITAMIN D Take 2,000 Units by mouth daily.   docusate sodium 100 MG capsule Commonly known as:  COLACE Take 100 mg by mouth daily as needed for mild constipation.   FRESHKOTE 2.7-2 % Soln Generic drug:  Polyvinyl Alcohol-Povidone Apply 1 drop to eye 3 (three) times daily. To both eyes   HYDROCELL DRESSING 4"X4" 4"X4" Pads Apply 1 application topically every 3 (three) days. Right buttocks   insulin glargine 100 UNIT/ML injection Commonly known as:   LANTUS Inject 20-25 Units into the skin 2 (two) times daily. 20 units at bedtime and 25 units in the AM   lisinopril 5 MG tablet Commonly known as:  PRINIVIL,ZESTRIL Take 2.5 mg by mouth one time a day   loratadine 10 MG tablet Commonly known as:  CLARITIN Take 10 mg by mouth daily as needed for allergies.   metoprolol tartrate 25 MG tablet Commonly known as:  LOPRESSOR Take 25 mg by mouth 2 (two) times daily. For HTN  Hold if B/P < 100/60 or HR < 60   nitrofurantoin 100 MG capsule Commonly known as:  MACRODANTIN Take 100 mg by mouth at bedtime.   PERCOCET 5-325 MG tablet Generic drug:  oxyCODONE-acetaminophen Take 1 tablet by mouth every 6 (six) hours as needed for severe pain.   phenazopyridine 200 MG tablet Commonly known as:  PYRIDIUM Take 200 mg by mouth every 8 (eight) hours as needed for pain.   phenazopyridine 200 MG tablet Commonly known as:  PYRIDIUM Take 1 tablet (200 mg total) by mouth 3 (three) times daily as needed for pain (note: discolor urine, stain clothing).   polyethylene glycol packet Commonly known as:  MIRALAX / GLYCOLAX Take 17 g by mouth daily.   SENOKOT S 8.6-50 MG tablet Generic drug:  senna-docusate Take 2 tablets by mouth at bedtime.       Meds ordered this encounter  Medications  . oxyCODONE-acetaminophen (PERCOCET) 5-325 MG tablet    Sig: Take 1 tablet by mouth every 6 (six) hours as needed for severe pain.  Marland Kitchen lisinopril (PRINIVIL,ZESTRIL) 5 MG tablet    Sig: Take 2.5 mg by mouth one time a day  . phenazopyridine (PYRIDIUM) 200 MG tablet    Sig: Take 200 mg by mouth every 8 (eight) hours as needed for pain.    Immunization History  Administered Date(s) Administered  . Influenza Whole 11/09/2012  . Influenza-Unspecified 11/12/2013, 11/11/2014  . Pneumococcal-Unspecified 01/03/2008    Social History  Substance Use Topics  . Smoking status: Never Smoker  . Smokeless tobacco: Never Used  . Alcohol use No    Review of  Systems  DATA OBTAINED: from patient, nurse GENERAL:  no fevers, fatigue, appetite changes SKIN: No itching, rash HEENT: No complaint RESPIRATORY: No cough, wheezing, SOB CARDIAC: No chest pain, palpitations, lower extremity edema  GI: No abdominal pain, No N/V/D or constipation, No heartburn or reflux  GU: No dysuria, frequency or urgency, or incontinence  MUSCULOSKELETAL: No unrelieved bone/joint pain NEUROLOGIC: No headache, dizziness  PSYCHIATRIC: No overt anxiety or sadness  Vitals:   09/04/15 1241  BP: 128/70  Pulse: 71  Resp: 18  Temp: 98 F (36.7 C)    Physical Exam  GENERAL APPEARANCE: Alert,  No acute distress  SKIN: No diaphoresis rash HEENT: Unremarkable RESPIRATORY: Breathing is even, unlabored. Lung sounds are clear   CARDIOVASCULAR: Heart RRR no murmurs, rubs or gallops. No peripheral edema  GASTROINTESTINAL: Abdomen is soft, non-tender, not distended w/ normal bowel sounds.  GENITOURINARY:  Bladder non tender, not distended; foley in place  MUSCULOSKELETAL: No abnormal joints or musculature NEUROLOGIC: Cranial nerves 2-12 grossly intact. Moves all extremities PSYCHIATRIC: Mood and affect appropriate to situation, no behavioral issues  Patient Active Problem List   Diagnosis Date Noted  . Bladder outlet obstruction 06/17/2015  . Acute pyelonephritis 05/29/2015  . Diarrhea 05/29/2015  . Nephrolithiasis 05/18/2015  . Sepsis ([place]) 05/17/2015  . AKI (acute kidney injury) ([place]) 05/17/2015  . UTI (lower urinary tract infection) 05/17/2015  . Environmental and seasonal allergies 04/06/2015  . Dyslipidemia associated with type 2 diabetes mellitus ([place]) 02/04/2015  . Diabetes mellitus, type II, insulin dependent ([place]) 02/04/2015  . Alzheimer's disease 07/10/2014  . Dyslipidemia 03/04/2014  . Depression 01/18/2014  . Insomnia 01/18/2014  . Hypothyroidism 01/18/2014  . Essential hypertension, benign   . Psychosis   . Vitamin D deficiency   . Osteoarthritis      CBC    Component Value Date/Time   WBC 10.2 07/18/2015 0845   RBC 3.80 (L) 07/18/2015 0845   HGB 11.5 (L) 07/18/2015 0845   HCT 34.5 (L) 07/18/2015 0845   PLT 278 07/18/2015 0845   MCV 90.8 07/18/2015 0845   LYMPHSABS 1.6 05/16/2015 1947   MONOABS 1.4 (H) 05/16/2015 1947   EOSABS 0.0 05/16/2015 1947   BASOSABS 0.0 05/16/2015 1947    CMP     Component Value Date/Time   NA 137 07/18/2015 0845   NA 137 05/26/2015   K 4.0 07/18/2015 0845   CL 105 07/18/2015 0845   CO2 25 07/18/2015 0845   GLUCOSE 103 (H) 07/18/2015 0845   BUN 12 07/18/2015 0845   BUN 13 05/26/2015   CREATININE 0.63 07/18/2015 0845   CALCIUM 8.9 07/18/2015 0845   PROT 6.9 05/16/2015 1947   ALBUMIN 2.6 (L) 05/16/2015 1947   AST 20 05/16/2015 1947   ALT 16 05/16/2015 1947   ALKPHOS 74 05/16/2015 1947   BILITOT 1.2 05/16/2015 1947   GFRNONAA >60 07/18/2015 0845   GFRAA >60 07/18/2015 0845    Assessment and Plan  Nephrolithiasis; outlet obstruction: has foley; is followed by urology; as B stents place for B stone obstructing B UPJ; pt is due for removal of stents next week   Diabetes: will continue lantus 25 units in the AM and  20 units in the PM; hgb a1c is 7.0  Dyslipidemia: will continue lipitor 10 mg daily ldl is 71; will monitor       Dashan Chizmar D. Sheppard Coil, MD

## 2015-09-05 ENCOUNTER — Encounter (HOSPITAL_COMMUNITY): Payer: Self-pay | Admitting: *Deleted

## 2015-09-05 DIAGNOSIS — B351 Tinea unguium: Secondary | ICD-10-CM | POA: Diagnosis not present

## 2015-09-05 DIAGNOSIS — M79672 Pain in left foot: Secondary | ICD-10-CM | POA: Diagnosis not present

## 2015-09-05 DIAGNOSIS — M79671 Pain in right foot: Secondary | ICD-10-CM | POA: Diagnosis not present

## 2015-09-05 NOTE — Progress Notes (Signed)
Preop instructions for:  Ana Newman                     Date of Birth      04-21-1935                      Date of Procedure: September 12, 2015   Doctor:  Dr Gaynelle Arabian Time to arrive at [facility]:  9:30 am  Report to: [facility] 3rd Floor   Any procedure time changes, MD office will notify you!   Do not eat or drink past midnight the night before your procedure.(To include any tube feedings-must be discontinued) Reminder:Follow bowel prep instructions per MD office!   Take these morning medications only with sips of water.(or give through gastrostomy or feeding tube).  LORATADINE (CLARITIN) IF NEEDED; METOPROLOL; OXYCODONE-ACETAMINOPHEN IF NEEDED  TAKE 1/2 EVENING DOSE OF INSULIN NIGHT PRIOR TO SURGERY   Note: No Insulin or Diabetic meds should be given or taken the morning of the procedure!   Facility contact:  [facility]       Phone:  [phone]           Health Care POA: daughter - Ammie Ferrier [phone]  Transportation contact phone#: [place]  Please send day of procedure:current med list and meds last taken that day, confirm nothing by mouth status from what time, Patient Demographic info( to include DNR status, problem list, allergies)   RN contact name/phone#:  [facility] [phone]                  Bring Insurance card and picture ID Leave all jewelry and other valuables at place where living( no metal or rings to be worn) No contact lens Women-no make-up, no lotions,perfumes,powders    Please follow chlorhexidine shower instruction night prior to and morning of surgery    Sent from :[facility]                   [place]                   [phone]  Sent by :[id number] Warden/ranger

## 2015-09-07 ENCOUNTER — Encounter: Payer: Self-pay | Admitting: Internal Medicine

## 2015-09-10 NOTE — Progress Notes (Signed)
Spoke with Ana Newman with [facility] facility. Verified had received instructions an if any further questions. Ms Ana Newman verbalized received instructions w/o further questions.

## 2015-09-11 DIAGNOSIS — R69 Illness, unspecified: Secondary | ICD-10-CM | POA: Diagnosis not present

## 2015-09-11 NOTE — H&P (Signed)
Office Visit Report     08/27/2015   --------------------------------------------------------------------------------   Ana Newman  MRN: [id number]  PRIMARY CARE:    DOB: December 29, 1935, 80 year old Female  REFERRING:    SSN: [id number]  PROVIDER:  Jiles Crocker    TREATING:  Carolan Clines, M.D.    LOCATION:  [facility] [phone]   --------------------------------------------------------------------------------   CC: I have kidney stones.(Surgery)  HPI: Ana Newman is a 80 year-old female established patient who is here for renal calculi after a surgical intervention.  The problem is on both sides. She had stent and ureteroscopy for treatment of her renal calculi. Patient denies eswl and percutaneous lithotomy. This procedure was done 07/18/2015. She did not pass stone fragments. She does have a stent in place.   She is not currently having flank pain, back pain, groin pain, nausea, vomiting, fever or chills.   She does have urgency.   S/p cysto/removal of Rt JJ stent/Rt RPG/replacement of Rt JJ stent/removal of Lt JJ stent/Lt RPG/laser of Lt lower ureteral stone/basket extraction of Lt lower ureteral stone fragments/Lt pyeloscopy/Lt RPG/Lt JJ stent placement.   She returns today to review KUB done at [facility] & to consider bilateral JJ stent removal.     ALLERGIES: No Allergies    MEDICATIONS: Acetaminophen CAPS Oral  Aspirin 325 MG Oral Tablet Oral  Atorvastatin Calcium 10 MG Oral Tablet Oral  Biotene Dry Mouth LIQD Mouth/Throat  Bisacodyl TBEC Oral  Colace 100 MG Oral Capsule Oral  Lantus SoloStar 100 UNIT/ML SOLN Subcutaneous  Lisinopril 2.5 MG Oral Tablet Oral  Loratadine 10 MG Oral Tablet Oral  Metoprolol Tartrate 25 MG Oral Tablet Oral  MiraLax POWD Oral  Oxycodone-Acetaminophen 5-325 MG Oral Tablet Oral Every 4 hours  Polyvinyl Alcohol 1.4 % Ophthalmic Solution Ophthalmic  Senna TABS Oral  Systane SOLN Ophthalmic  Vitamin D 2000 UNIT Oral Tablet  Oral     GU PSH: Cysto/Uretero W/Lithotripsy, Left - 07/18/2015 Cystoscopy Insert Stent, Right - 07/18/2015, 05/30/2015      PSH Notes: Cystoscopy With Insertion Of Ureteral Stent Bilateral, Appendectomy   NON-GU PSH: Appendectomy - 05/26/2015    GU PMH: Calculus Ureter, Obstruction of left ureteropelvic junction (UPJ) due to stone - 05/27/2015, Obstruction of right ureteropelvic junction (UPJ) due to stone, - 05/27/2015    NON-GU PMH: Encounter for general adult medical examination without abnormal findings, Encounter for preventive health examination - 05/30/2015 Personal history of other endocrine, nutritional and metabolic disease, History of hypothyroidism - 05/27/2015, History of hyperlipidemia, - 05/26/2015, History of type 2 diabetes mellitus, - 05/26/2015 Personal history of other diseases of the circulatory system, History of hypertension - 05/26/2015 Personal history of other diseases of the digestive system, History of esophageal reflux - 05/26/2015 Personal history of other diseases of the musculoskeletal system and connective tissue, History of osteoarthritis - 05/26/2015 Unspecified dementia without behavioral disturbance, Dementia - 05/26/2015 Vitamin D deficiency, unspecified, Vitamin D deficiency - 05/26/2015    FAMILY HISTORY: None   SOCIAL HISTORY: Marital Status: Unknown Current Smoking Status: Unknown if patient smokes.      Notes: No alcohol use, Never a smoker   REVIEW OF SYSTEMS:    GU Review Female:   Patient denies burning /pain with urination, stream starts and stops, frequent urination, trouble starting your stream, get up at night to urinate, hard to postpone urination, currently pregnant, have to strain to urinate, and leakage of urine.  Gastrointestinal (Upper):   Patient denies nausea, vomiting, and indigestion/ heartburn.  Gastrointestinal (Lower):   Patient denies diarrhea and constipation.  Constitutional:   Patient denies fever, night sweats, weight loss, and  fatigue.  Skin:   Patient denies skin rash/ lesion and itching.  Eyes:   Patient denies blurred vision and double vision.  Ears/ Nose/ Throat:   Patient denies sore throat and sinus problems.  Hematologic/Lymphatic:   Patient denies swollen glands and easy bruising.  Cardiovascular:   Patient denies leg swelling and chest pains.  Respiratory:   Patient denies cough and shortness of breath.  Endocrine:   Patient denies excessive thirst.  Musculoskeletal:   Patient denies back pain and joint pain.  Neurological:   Patient denies headaches and dizziness.  Psychologic:   Patient denies depression and anxiety.   VITAL SIGNS:      08/27/2015 03:21 PM  BP 137/67 mmHg  Pulse 65 /min  Temperature 98.5 F / 37 C   PAST DATA REVIEWED:  Source Of History:  Patient  Records Review:   Previous Hospital Records  X-Ray Review: Outside X-Ray: Reviewed Films. Discussed With Patient.     PROCEDURES:         KUB - 74000  A single view of the abdomen is obtained.evaluation shows bilateral double-J stents in place. There are no large ureteral calculi. There are bilateral renal stones, as seen in the past, which are small.  Bony Abnormalities:  none.  Soft Tissue:  normal.   Calculi:  Upper pole left kidney calculi. Upper pole right kidney calculi. Right largest calculi measures: ______  Ureteral Stent:  In position ureteral stent. Left ureteral stent. Right ureteral stent. Bilateral ureteral stent.         ASSESSMENT:      ICD-10 Details  1 GU:   Kidney Stone - N20.0               Notes:   KUB still shows small bilateral renal stones. She is not able to really bend her legs up enough to have a cystoscopy/removal of JJ stents in the office today. She will have this done as an outpatient procedure.    PLAN:           Schedule Return Visit: Next Available Appointment - Schedule Surgery          Document Letter(s):  Created for Patient: Clinical Summary         Notes:   arrange cystoscopy  and removal of double-J stents at Hospital under sedation.    Signed by Carolan Clines, M.D. on 08/27/15 at 5:47 PM (EDT)     The information contained in this medical record document is considered private and confidential patient information. This information can only be used for the medical diagnosis and/or medical services that are being provided by the patient's selected caregivers. This information can only be distributed outside of the patient's care if the patient agrees and signs waivers of authorization for this information to be sent to an outside source or route.

## 2015-09-12 ENCOUNTER — Ambulatory Visit (HOSPITAL_COMMUNITY): Payer: Medicare Other | Admitting: Anesthesiology

## 2015-09-12 ENCOUNTER — Ambulatory Visit (HOSPITAL_COMMUNITY): Payer: Medicare Other

## 2015-09-12 ENCOUNTER — Ambulatory Visit (HOSPITAL_COMMUNITY)
Admission: RE | Admit: 2015-09-12 | Discharge: 2015-09-12 | Disposition: A | Discharge status: Skilled Nursing Facility | Payer: Medicare Other | Source: Ambulatory Visit | Attending: Urology | Admitting: Urology

## 2015-09-12 ENCOUNTER — Encounter (HOSPITAL_COMMUNITY)
Admission: RE | Disposition: A | Discharge status: Skilled Nursing Facility | Payer: Self-pay | Source: Ambulatory Visit | Attending: Urology

## 2015-09-12 ENCOUNTER — Encounter (HOSPITAL_COMMUNITY): Payer: Self-pay

## 2015-09-12 DIAGNOSIS — E559 Vitamin D deficiency, unspecified: Secondary | ICD-10-CM | POA: Insufficient documentation

## 2015-09-12 DIAGNOSIS — N200 Calculus of kidney: Secondary | ICD-10-CM | POA: Diagnosis not present

## 2015-09-12 DIAGNOSIS — N189 Chronic kidney disease, unspecified: Secondary | ICD-10-CM | POA: Diagnosis not present

## 2015-09-12 DIAGNOSIS — E119 Type 2 diabetes mellitus without complications: Secondary | ICD-10-CM | POA: Insufficient documentation

## 2015-09-12 DIAGNOSIS — Z466 Encounter for fitting and adjustment of urinary device: Secondary | ICD-10-CM | POA: Insufficient documentation

## 2015-09-12 DIAGNOSIS — E1165 Type 2 diabetes mellitus with hyperglycemia: Secondary | ICD-10-CM | POA: Diagnosis not present

## 2015-09-12 DIAGNOSIS — Z79899 Other long term (current) drug therapy: Secondary | ICD-10-CM | POA: Diagnosis not present

## 2015-09-12 DIAGNOSIS — R319 Hematuria, unspecified: Secondary | ICD-10-CM | POA: Diagnosis not present

## 2015-09-12 DIAGNOSIS — I129 Hypertensive chronic kidney disease with stage 1 through stage 4 chronic kidney disease, or unspecified chronic kidney disease: Secondary | ICD-10-CM | POA: Diagnosis not present

## 2015-09-12 DIAGNOSIS — I10 Essential (primary) hypertension: Secondary | ICD-10-CM | POA: Diagnosis not present

## 2015-09-12 DIAGNOSIS — Z794 Long term (current) use of insulin: Secondary | ICD-10-CM | POA: Diagnosis not present

## 2015-09-12 DIAGNOSIS — Z01818 Encounter for other preprocedural examination: Secondary | ICD-10-CM | POA: Diagnosis not present

## 2015-09-12 DIAGNOSIS — F209 Schizophrenia, unspecified: Secondary | ICD-10-CM | POA: Insufficient documentation

## 2015-09-12 DIAGNOSIS — E785 Hyperlipidemia, unspecified: Secondary | ICD-10-CM | POA: Insufficient documentation

## 2015-09-12 DIAGNOSIS — T191XXA Foreign body in bladder, initial encounter: Secondary | ICD-10-CM | POA: Diagnosis not present

## 2015-09-12 DIAGNOSIS — Z7982 Long term (current) use of aspirin: Secondary | ICD-10-CM | POA: Diagnosis not present

## 2015-09-12 DIAGNOSIS — F0390 Unspecified dementia without behavioral disturbance: Secondary | ICD-10-CM | POA: Insufficient documentation

## 2015-09-12 DIAGNOSIS — E039 Hypothyroidism, unspecified: Secondary | ICD-10-CM | POA: Insufficient documentation

## 2015-09-12 DIAGNOSIS — K219 Gastro-esophageal reflux disease without esophagitis: Secondary | ICD-10-CM | POA: Diagnosis not present

## 2015-09-12 DIAGNOSIS — R9389 Abnormal findings on diagnostic imaging of other specified body structures: Secondary | ICD-10-CM

## 2015-09-12 HISTORY — DX: Other allergic rhinitis: J30.89

## 2015-09-12 HISTORY — DX: Acute pyelonephritis: N10

## 2015-09-12 HISTORY — DX: Insomnia, unspecified: G47.00

## 2015-09-12 HISTORY — DX: Diarrhea, unspecified: R19.7

## 2015-09-12 HISTORY — DX: Essential (primary) hypertension: I10

## 2015-09-12 HISTORY — DX: Personal history of urinary (tract) infections: Z87.440

## 2015-09-12 HISTORY — DX: Sepsis, unspecified organism: A41.9

## 2015-09-12 HISTORY — PX: CYSTOSCOPY W/ URETERAL STENT REMOVAL: SHX1430

## 2015-09-12 HISTORY — DX: Bladder-neck obstruction: N32.0

## 2015-09-12 LAB — CBC
HCT: 38 % (ref 36.0–46.0)
Hemoglobin: 12.7 g/dL (ref 12.0–15.0)
MCH: 30.8 pg (ref 26.0–34.0)
MCHC: 33.4 g/dL (ref 30.0–36.0)
MCV: 92.2 fL (ref 78.0–100.0)
PLATELETS: 327 10*3/uL (ref 150–400)
RBC: 4.12 MIL/uL (ref 3.87–5.11)
RDW: 13.6 % (ref 11.5–15.5)
WBC: 13.4 10*3/uL — ABNORMAL HIGH (ref 4.0–10.5)

## 2015-09-12 LAB — BASIC METABOLIC PANEL
Anion gap: 7 (ref 5–15)
BUN: 17 mg/dL (ref 6–20)
CALCIUM: 9.3 mg/dL (ref 8.9–10.3)
CO2: 27 mmol/L (ref 22–32)
CREATININE: 0.8 mg/dL (ref 0.44–1.00)
Chloride: 103 mmol/L (ref 101–111)
GFR calc non Af Amer: >60 mL/min (ref >60)
GLUCOSE: 96 mg/dL (ref 65–99)
Potassium: 4.5 mmol/L (ref 3.5–5.1)
Sodium: 137 mmol/L (ref 135–145)

## 2015-09-12 LAB — GLUCOSE, CAPILLARY
GLUCOSE-CAPILLARY: 74 mg/dL (ref 65–99)
GLUCOSE-CAPILLARY: 93 mg/dL (ref 65–99)

## 2015-09-12 SURGERY — REMOVAL, STENT, URETER, CYSTOSCOPIC
Anesthesia: General | Laterality: Bilateral

## 2015-09-12 MED ORDER — CEFAZOLIN SODIUM-DEXTROSE 2-4 GM/100ML-% IV SOLN
INTRAVENOUS | Status: AC
  Filled 2015-09-12: qty 100

## 2015-09-12 MED ORDER — FENTANYL CITRATE (PF) 100 MCG/2ML IJ SOLN
INTRAMUSCULAR | Status: AC
  Filled 2015-09-12: qty 2

## 2015-09-12 MED ORDER — DEXTROSE 5 % IV SOLN: 2000.0000 mg | Freq: Once | INTRAVENOUS | Status: DC

## 2015-09-12 MED ORDER — FENTANYL CITRATE (PF) 100 MCG/2ML IJ SOLN
INTRAMUSCULAR | Status: DC | PRN
  Administered 2015-09-12 (×2): 50 ug via INTRAVENOUS

## 2015-09-12 MED ORDER — PROMETHAZINE HCL 25 MG/ML IJ SOLN: 6.2500 mg | INTRAMUSCULAR | Status: DC | PRN

## 2015-09-12 MED ORDER — LACTATED RINGERS IV SOLN
INTRAVENOUS | Status: DC
  Administered 2015-09-12: 11:00:00 via INTRAVENOUS

## 2015-09-12 MED ORDER — PROPOFOL 10 MG/ML IV BOLUS
INTRAVENOUS | Status: DC | PRN
  Administered 2015-09-12: 120 mg via INTRAVENOUS

## 2015-09-12 MED ORDER — STERILE WATER FOR IRRIGATION IR SOLN
Status: DC | PRN
  Administered 2015-09-12: 3000 mL

## 2015-09-12 MED ORDER — ONDANSETRON HCL 4 MG/2ML IJ SOLN
INTRAMUSCULAR | Status: DC | PRN
  Administered 2015-09-12: 4 mg via INTRAVENOUS

## 2015-09-12 MED ORDER — NITROFURANTOIN MONOHYD MACRO 100 MG PO CAPS: 100.0000 mg | ORAL_CAPSULE | Freq: Every day | ORAL | 1 refills | Status: DC

## 2015-09-12 MED ORDER — EPHEDRINE SULFATE 50 MG/ML IJ SOLN
INTRAMUSCULAR | Status: DC | PRN
  Administered 2015-09-12: 10 mg via INTRAVENOUS

## 2015-09-12 MED ORDER — FENTANYL CITRATE (PF) 100 MCG/2ML IJ SOLN
25.0000 ug | INTRAMUSCULAR | Status: DC | PRN
  Administered 2015-09-12 (×2): 25 ug via INTRAVENOUS

## 2015-09-12 MED ORDER — CEFAZOLIN SODIUM-DEXTROSE 2-4 GM/100ML-% IV SOLN
2.0000 g | INTRAVENOUS | Status: AC
  Administered 2015-09-12: 2 g via INTRAVENOUS
  Filled 2015-09-12: qty 100

## 2015-09-12 MED ORDER — OXYCODONE-ACETAMINOPHEN 5-325 MG PO TABS: 1.0000 | ORAL_TABLET | Freq: Four times a day (QID) | ORAL | 0 refills | Status: DC | PRN

## 2015-09-12 MED ORDER — PROPOFOL 10 MG/ML IV BOLUS
INTRAVENOUS | Status: AC
  Filled 2015-09-12: qty 20

## 2015-09-12 MED ORDER — LIDOCAINE HCL (CARDIAC) 20 MG/ML IV SOLN
INTRAVENOUS | Status: DC | PRN
  Administered 2015-09-12: 20 mg via INTRAVENOUS

## 2015-09-12 MED ORDER — METOPROLOL TARTRATE 25 MG PO TABS
25.0000 mg | ORAL_TABLET | Freq: Once | ORAL | Status: AC
  Administered 2015-09-12: 25 mg via ORAL
  Filled 2015-09-12: qty 1

## 2015-09-12 SURGICAL SUPPLY — 13 items
BAG URO CATCHER STRL LF (MISCELLANEOUS) ×3 IMPLANT
CATH INTERMIT  6FR 70CM (CATHETERS) ×1 IMPLANT
CLOTH BEACON ORANGE TIMEOUT ST (SAFETY) ×3 IMPLANT
GLOVE BIOGEL M STRL SZ7.5 (GLOVE) ×3 IMPLANT
GOWN STRL REUS W/TWL LRG LVL3 (GOWN DISPOSABLE) ×3 IMPLANT
GOWN STRL REUS W/TWL XL LVL3 (GOWN DISPOSABLE) ×3 IMPLANT
GUIDEWIRE STR DUAL SENSOR (WIRE) ×1 IMPLANT
MANIFOLD NEPTUNE II (INSTRUMENTS) ×3 IMPLANT
NS IRRIG 1000ML POUR BTL (IV SOLUTION) ×1 IMPLANT
PACK CYSTO (CUSTOM PROCEDURE TRAY) ×3 IMPLANT
SCRUB TECHNI CARE 4 OZ NO DYE (MISCELLANEOUS) ×1 IMPLANT
TUBING CONNECTING 10 (TUBING) ×2 IMPLANT
TUBING CONNECTING 10' (TUBING) ×1

## 2015-09-12 NOTE — Progress Notes (Signed)
Called report to Dollene Cleveland, Doctor, hospital, at [facility] facility.

## 2015-09-12 NOTE — Anesthesia Procedure Notes (Signed)
Procedure Name: LMA Insertion Date/Time: 09/12/2015 12:30 PM Performed by: Lajuana Carry E Pre-anesthesia Checklist: Patient identified, Emergency Drugs available, Suction available and Patient being monitored Patient Re-evaluated:Patient Re-evaluated prior to inductionOxygen Delivery Method: Circle system utilized Preoxygenation: Pre-oxygenation with 100% oxygen Intubation Type: IV induction LMA: LMA inserted LMA Size: 4.0 Number of attempts: 2 Placement Confirmation: CO2 detector and breath sounds checked- equal and bilateral Tube secured with: Tape Dental Injury: Teeth and Oropharynx as per pre-operative assessment

## 2015-09-12 NOTE — Anesthesia Preprocedure Evaluation (Signed)
Anesthesia Evaluation  Patient identified by MRN, date of birth, ID band Patient awake    Reviewed: Allergy & Precautions, H&P , NPO status , Patient's Chart, lab work & pertinent test results, reviewed documented beta blocker date and time   Airway Mallampati: II  TM Distance: >3 FB Neck ROM: Full    Dental no notable dental hx. (+) Teeth Intact, Dental Advisory Given, Partial Upper   Pulmonary neg pulmonary ROS,    Pulmonary exam normal breath sounds clear to auscultation       Cardiovascular hypertension, Pt. on medications and Pt. on home beta blockers  Rhythm:Regular Rate:Normal     Neuro/Psych Depression Schizophrenia negative neurological ROS     GI/Hepatic Neg liver ROS, GERD  Medicated and Controlled,  Endo/Other  diabetes, Type 1, Insulin DependentHypothyroidism   Renal/GU Renal disease  negative genitourinary   Musculoskeletal  (+) Arthritis , Osteoarthritis,    Abdominal   Peds  Hematology negative hematology ROS (+)   Anesthesia Other Findings   Reproductive/Obstetrics negative OB ROS                             Anesthesia Physical  Anesthesia Plan  ASA: III  Anesthesia Plan: General   Post-op Pain Management:    Induction: Intravenous  Airway Management Planned: LMA  Additional Equipment:   Intra-op Plan:   Post-operative Plan: Extubation in OR  Informed Consent: I have reviewed the patients History and Physical, chart, labs and discussed the procedure including the risks, benefits and alternatives for the proposed anesthesia with the patient or authorized representative who has indicated his/her understanding and acceptance.   Dental advisory given  Plan Discussed with: CRNA  Anesthesia Plan Comments:          Anesthesia Quick Evaluation

## 2015-09-12 NOTE — Transfer of Care (Signed)
Immediate Anesthesia Transfer of Care Note  Patient: Ana Newman  Procedure(s) Performed: Procedure(s) with comments: CYSTOSCOPY WITH STENT REMOVAL (Bilateral) - 1 HOUR  Patient Location: PACU  Anesthesia Type:General  Level of Consciousness:  sedated, patient cooperative and responds to stimulation  Airway & Oxygen Therapy:Patient Spontanous Breathing and Patient connected to face mask oxgen  Post-op Assessment:  Report given to PACU RN and Post -op Vital signs reviewed and stable  Post vital signs:  Reviewed and stable  Last Vitals:  Vitals:   09/12/15 0958  BP: (!) 149/95  Pulse: 67  Resp: 18  Temp: 123XX123 C    Complications: No apparent anesthesia complications

## 2015-09-12 NOTE — Anesthesia Postprocedure Evaluation (Signed)
Anesthesia Post Note  Patient: Ana Newman  Procedure(s) Performed: Procedure(s) (LRB): CYSTOSCOPY WITH STENT REMOVAL (Bilateral)  Patient location during evaluation: PACU Anesthesia Type: General Level of consciousness: awake and alert Pain management: pain level controlled Vital Signs Assessment: post-procedure vital signs reviewed and stable Respiratory status: spontaneous breathing, nonlabored ventilation, respiratory function stable and patient connected to nasal cannula oxygen Cardiovascular status: blood pressure returned to baseline and stable Postop Assessment: no signs of nausea or vomiting Anesthetic complications: no    Last Vitals:  Vitals:   09/12/15 1400 09/12/15 1417  BP: 123/61 115/62  Pulse: 62 64  Resp: 12 16  Temp: 36.4 C 36.9 C    Last Pain:  Vitals:   09/12/15 1428  TempSrc:   PainSc: 2                  Reginal Lutes

## 2015-09-12 NOTE — Discharge Instructions (Signed)
General Anesthesia, Adult, Care After Refer to this sheet in the next few weeks. These instructions provide you with information on caring for yourself after your procedure. Your health care provider may also give you more specific instructions. Your treatment has been planned according to current medical practices, but problems sometimes occur. Call your health care provider if you have any problems or questions after your procedure. WHAT TO EXPECT AFTER THE PROCEDURE After the procedure, it is typical to experience:  Sleepiness.  Nausea and vomiting. HOME CARE INSTRUCTIONS  For the first 24 hours after general anesthesia:  Have a responsible person with you.  Do not drive a car. If you are alone, do not take public transportation.  Do not drink alcohol.  Do not take medicine that has not been prescribed by your health care provider.  Do not sign important papers or make important decisions.  You may resume a normal diet and activities as directed by your health care provider.  Change bandages (dressings) as directed.  If you have questions or problems that seem related to general anesthesia, call the hospital and ask for the anesthetist or anesthesiologist on call. SEEK MEDICAL CARE IF:  You have nausea and vomiting that continue the day after anesthesia.  You develop a rash. SEEK IMMEDIATE MEDICAL CARE IF:   You have difficulty breathing.  You have chest pain.  You have any allergic problems.   This information is not intended to replace advice given to you by your health care provider. Make sure you discuss any questions you have with your health care provider.   Document Released: 04/26/2000 Document Revised: 02/08/2014 Document Reviewed: 05/19/2011 Elsevier Interactive Patient Education 2016 [place]. Cystoscopy, Care After Refer to this sheet in the next few weeks. These instructions provide you with information on caring for yourself after your procedure.  Your caregiver may also give you more specific instructions. Your treatment has been planned according to current medical practices, but problems sometimes occur. Call your caregiver if you have any problems or questions after your procedure. HOME CARE INSTRUCTIONS  Things you can do to ease any discomfort after your procedure include:  Drinking enough water and fluids to keep your urine clear or pale yellow.  Taking a warm bath to relieve any burning feelings. SEEK IMMEDIATE MEDICAL CARE IF:   You have an increase in blood in your urine.  You notice blood clots in your urine.  You have difficulty passing urine.  You have the chills.  You have abdominal pain.  You have a fever or persistent symptoms for more than 2-3 days.  You have a fever and your symptoms suddenly get worse. MAKE SURE YOU:   Understand these instructions.  Will watch your condition.  Will get help right away if you are not doing well or get worse.   This information is not intended to replace advice given to you by your health care provider. Make sure you discuss any questions you have with your health care provider.   Document Released: 08/07/2004 Document Revised: 02/08/2014 Document Reviewed: 07/12/2011 Elsevier Interactive Patient Education [employer].

## 2015-09-12 NOTE — Interval H&P Note (Signed)
History and Physical Interval Note:  09/12/2015 11:53 AM  Ana Newman  has presented today for surgery, with the diagnosis of BILATERAL RETAINED JJ STENTS  The various methods of treatment have been discussed with the patient and family. After consideration of risks, benefits and other options for treatment, the patient has consented to  Procedure(s) with comments: CYSTOSCOPY WITH STENT REMOVAL (Bilateral) - 1 HOUR as a surgical intervention .  The patient's history has been reviewed, patient examined, no change in status, stable for surgery.  I have reviewed the patient's chart and labs.  Questions were answered to the patient's satisfaction.     Rayli Wiederhold I Jawad Wiacek

## 2015-09-12 NOTE — Op Note (Signed)
Pre-operative diagnosis :  Bilateral retained JJ stents  Postoperative diagnosis:  same  Operation:  Cystoscopy, removal of bilateral JJ stents  Surgeon:  S. Gaynelle Arabian, MD  First assistant:  none  Anesthesia:  General LMA  Preparation:  After appropriate preanesthesia, the patient was brought to the operative room, placed on the operating table in the dorsal supine position where general LMA anesthesia was introduced. She was replaced in the dorsal lithotomy position with pubis was prepped with Betadine solution and draped in usual fashion. It is noted that the left leg is permanently straight, and does not move well. Special care was taken to avoid any injury to the left hip and left leg. The history was double checked. The armband was double checked. KUB in the office has shown no further stone disease.  Review history:  The problem is on both sides. She had stent and ureteroscopy for treatment of her renal calculi. Patient denies eswl and percutaneous lithotomy. This procedure was done 07/18/2015. She did not pass stone fragments. She does have a stent in place.   She is not currently having flank pain, back pain, groin pain, nausea, vomiting, fever or chills.   She does have urgency.   S/p cysto/removal of Rt JJ stent/Rt RPG/replacement of Rt JJ stent/removal of Lt JJ stent/Lt RPG/laser of Lt lower ureteral stone/basket extraction of Lt lower ureteral stone fragments/Lt pyeloscopy/Lt RPG/Lt JJ stent placement.   She returns today to review KUB done at [facility] & to consider bilateral JJ stent removal.   Statement of  Likelihood of Success: Excellent. TIME-OUT observed.:  Procedure:  Cystourethroscopy shows a patulous urethra, but normal-appearing trigone. Double-J catheters were present, and coiled normally in the bladder. Each was removed in standard fashion without difficulty. There was no stone material on the catheters. Repeat cystoscopy showed no disease within the bladder. The  bladder was irrigated free of fluid, and the patient was awakened and taken to recovery in good condition. I did not leave a Foley catheter in place.

## 2015-09-13 LAB — HEMOGLOBIN A1C
Hgb A1c MFr Bld: 6.4 % — ABNORMAL HIGH (ref 4.8–5.6)
MEAN PLASMA GLUCOSE: 137 mg/dL

## 2015-09-30 ENCOUNTER — Other Ambulatory Visit: Payer: Self-pay

## 2015-09-30 MED ORDER — OXYCODONE-ACETAMINOPHEN 5-325 MG PO TABS: 1.0000 | ORAL_TABLET | ORAL | 0 refills | Status: DC | PRN

## 2015-09-30 NOTE — Telephone Encounter (Signed)
Prescription request was received from:  [facility]  [address] [place] [address]  PHONE: [phone]   Fax: [phone] 

## 2015-10-08 ENCOUNTER — Encounter: Payer: Self-pay | Admitting: Adult Health

## 2015-10-08 ENCOUNTER — Non-Acute Institutional Stay (SKILLED_NURSING_FACILITY): Payer: Medicare Other | Admitting: Adult Health

## 2015-10-08 DIAGNOSIS — G308 Other Alzheimer's disease: Secondary | ICD-10-CM

## 2015-10-08 DIAGNOSIS — F0280 Dementia in other diseases classified elsewhere without behavioral disturbance: Secondary | ICD-10-CM | POA: Diagnosis not present

## 2015-10-08 DIAGNOSIS — E034 Atrophy of thyroid (acquired): Secondary | ICD-10-CM | POA: Diagnosis not present

## 2015-10-08 DIAGNOSIS — E1169 Type 2 diabetes mellitus with other specified complication: Secondary | ICD-10-CM | POA: Diagnosis not present

## 2015-10-08 DIAGNOSIS — E119 Type 2 diabetes mellitus without complications: Secondary | ICD-10-CM

## 2015-10-08 DIAGNOSIS — Z794 Long term (current) use of insulin: Secondary | ICD-10-CM | POA: Diagnosis not present

## 2015-10-08 DIAGNOSIS — E785 Hyperlipidemia, unspecified: Secondary | ICD-10-CM

## 2015-10-08 DIAGNOSIS — I10 Essential (primary) hypertension: Secondary | ICD-10-CM

## 2015-10-08 DIAGNOSIS — E038 Other specified hypothyroidism: Secondary | ICD-10-CM

## 2015-10-08 NOTE — Progress Notes (Signed)
Patient ID: Ana Newman, female   DOB: [birth year], 80 y.o.   MRN: [id number]    Location:   [facility] Room Number: 115-B Place of Service:  SNF (31)   CODE STATUS: Full Code  No Known Allergies  Chief Complaint  Patient presents with  . Medical Management of Chronic Issues    Follow up    HPI:  She is a long term resident of this facility being seen for the management of her chronic illnesses. Overall there is little change in her status. She tells me that she is feeling good and has no concerns. There are no nursing concerns at this time.    Past Medical History:  Diagnosis Date  . Acute pyelonephritis   . AKI (acute kidney injury) ([place])   . Alzheimer's dementia   . Alzheimer's disease   . Bladder outlet obstruction   . Depression   . Diarrhea   . Dyslipidemia   . Environmental and seasonal allergies   . Essential hypertension, benign   . Foley catheter in place   . GERD (gastroesophageal reflux disease)   . History of urinary tract infection   . Hypertension   . Hypothyroidism   . Insomnia   . Nephrolithiasis   . Osteoarthritis   . Reflux esophagitis   . Rickets, active   . Sepsis ([place])   . Type II or unspecified type diabetes mellitus without mention of complication, uncontrolled   . Unspecified constipation   . Unspecified psychosis   . Unspecified vitamin D deficiency     Past Surgical History:  Procedure Laterality Date  . ABDOMINAL HYSTERECTOMY    . CYSTOSCOPY W/ URETERAL STENT PLACEMENT Bilateral 05/18/2015   Procedure: CYSTOSCOPY WITH RETROGRADE PYELOGRAM/URETERAL STENT PLACEMENT;  Surgeon: Carolan Clines, MD;  Location: [facility];  Service: Urology;  Laterality: Bilateral;  . CYSTOSCOPY W/ URETERAL STENT REMOVAL Bilateral 09/12/2015   Procedure: CYSTOSCOPY WITH STENT REMOVAL;  Surgeon: Carolan Clines, MD;  Location: [facility];  Service: Urology;  Laterality: Bilateral;  1 HOUR  . CYSTOSCOPY WITH RETROGRADE PYELOGRAM, URETEROSCOPY  AND STENT PLACEMENT Bilateral 07/18/2015   Procedure: CYSTOSCOPY REMOVE LEFT DOUBLE J STENT LEFT RETROGRADE PYELOGRAM, LEFT URETEROSCOPY, PYELOSCOPY, REPLACEMENT OF LEFT DOUBLE J STENT;  Surgeon: Carolan Clines, MD;  Location: [facility];  Service: Urology;  Laterality: Bilateral;  . HOLMIUM LASER APPLICATION Bilateral [id number]   Procedure: HOLMIUM LASER OF LEFT LOWER URETERAL STONE, LEFT PYELOSCOPY, REPLACE LEFT DOUBLE J STENT REMOVE RIGHT DOUBLE J STENT RIGHT RETROGRADE PYELOGRAM, REPLACEMENT RIGHT DOUBLE J STENT ;  Surgeon: Carolan Clines, MD;  Location: [facility];  Service: Urology;  Laterality: Bilateral;  . KNEE ARTHROSCOPY Left 1997    Social History   Social History  . Marital status: Married    Spouse name: N/A  . Number of children: N/A  . Years of education: N/A   Occupational History  . Not on file.   Social History Main Topics  . Smoking status: Never Smoker  . Smokeless tobacco: Never Used  . Alcohol use No  . Drug use: No  . Sexual activity: Not on file   Other Topics Concern  . Not on file   Social History Narrative  . No narrative on file   Family History  Problem Relation Age of Onset  . Family history unknown: Yes      VITAL SIGNS BP (!) 144/86   Pulse 78   Temp 97.5 F (36.4 C) (Oral)   Resp 18   Ht 4'  11" (1.499 m)   Wt 260 lb 5 oz (118.1 kg)   SpO2 97%   BMI 52.58 kg/m   Patient's Medications  New Prescriptions   No medications on file  Previous Medications   ACETAMINOPHEN (TYLENOL) 325 MG TABLET    Take 650 mg by mouth every 6 (six) hours as needed for moderate pain.   ACETAMINOPHEN (TYLENOL) 500 MG TABLET    Take 1,000 mg by mouth daily. Pain management   ASPIRIN 325 MG EC TABLET    Take 325 mg by mouth daily.   ATORVASTATIN (LIPITOR) 10 MG TABLET    Take 10 mg by mouth daily at 6 PM.   BISACODYL (DULCOLAX) 5 MG EC TABLET    Take 10 mg by mouth daily as needed for moderate constipation.   CHOLECALCIFEROL (VITAMIN D) 1000 UNITS TABLET     Take 2,000 Units by mouth daily.   DOCUSATE SODIUM (COLACE) 100 MG CAPSULE    Take 100 mg by mouth daily as needed for mild constipation.    INSULIN GLARGINE (LANTUS) 100 UNIT/ML INJECTION    Inject 20-25 Units into the skin 2 (two) times daily. 20 units at bedtime and 25 units in the AM   LISINOPRIL (PRINIVIL,ZESTRIL) 5 MG TABLET    Take 2.5 mg by mouth one time a day   LORATADINE (CLARITIN) 10 MG TABLET    Take 10 mg by mouth daily as needed for allergies.    METOPROLOL TARTRATE (LOPRESSOR) 25 MG TABLET    Take 25 mg by mouth 2 (two) times daily. For HTN  Hold if B/P < 100/60 or HR < 60   NITROFURANTOIN (MACRODANTIN) 100 MG CAPSULE    Take 100 mg by mouth at bedtime.    NITROFURANTOIN, MACROCRYSTAL-MONOHYDRATE, (MACROBID) 100 MG CAPSULE    Take 1 capsule (100 mg total) by mouth at bedtime.   OXYCODONE-ACETAMINOPHEN (ROXICET) 5-325 MG TABLET    Take 1 tablet by mouth every 4 (four) hours as needed for severe pain.   PHENAZOPYRIDINE (PYRIDIUM) 200 MG TABLET    Take 200 mg by mouth every 8 (eight) hours as needed for pain.   POLYETHYLENE GLYCOL (MIRALAX / GLYCOLAX) PACKET    Take 17 g by mouth daily.    POLYVINYL ALCOHOL-POVIDONE (FRESHKOTE) 2.7-2 % SOLN    Apply 1 drop to eye 3 (three) times daily. To both eyes   SENNA-DOCUSATE (SENOKOT S) 8.6-50 MG PER TABLET    Take 2 tablets by mouth at bedtime.    SKIN PROTECTANTS, MISC. (CALAZIME SKIN PROTECTANT EX)    Apply 1 application topically 2 (two) times daily.  Modified Medications   No medications on file  Discontinued Medications   GAUZE PADS & DRESSINGS (HYDROCELL DRESSING 4"X4") 4"X4" PADS    Apply 1 application topically every 3 (three) days. Right buttocks   OXYCODONE-ACETAMINOPHEN (PERCOCET) 5-325 MG TABLET    Take 1 tablet by mouth every 6 (six) hours as needed for severe pain.   PHENAZOPYRIDINE (PYRIDIUM) 200 MG TABLET    Take 1 tablet (200 mg total) by mouth 3 (three) times daily as needed for pain (note: discolor urine, stain clothing).      SIGNIFICANT DIAGNOSTIC EXAMS  05-13-15: chest x-ray: no active disease is seen in chest  05-16-15: chest x-ray; Stable mild chronic bronchitic changes.  No acute abnormality.   05-18-15: chest x-ray: No acute abnormality.  Mild chronic interstitial lung disease.   05-18-15: ct of abdomen and pelvis: 1. Motion and hardware degraded images. 2. Moderate  left-sided hydroureteronephrosis secondary to a distal left ureteric 11 x 13 mm calculus. Decreased left-sided renal function. 3. Large burden bilateral renal collecting system stones, including stones within both extrarenal pelves. 4. Cirrhosis with small volume ascites. 5. Proctitis, likely related to C. difficile colitis, given the clinical history. 6. Bladder wall irregularity which could relate to cystitis. Bladder diverticulum, suggesting a component of outlet obstruction. 7. Skin irregularity superficial to the coccyx. Recommend physical exam correlation to exclude decubitus ulcer. No gross osseous destruction.   LABS REVIEWED:   11-26-14: wbc 8.0; hgb 12.1; hct 39.0; mcv 95.7; plt 339; hgb a1c 7.1  12-11-14: vit d 37.32  02-05-15: chol 156; ldl 71; trig 207; hdl 44; tsh 5.01 03-06-15: wbc 8.9;  hgb 13.2; hct 41.1; mcv 93.8; plt 274; glucose 142; bun 15.2; creat 0.72; k+ 4.4; na++132; liver normal albumin 3.6; hgb a1c 7.3  05-14-15; wbc 15.6; hgb 11.1 ;hct 33.3; mcv 92.6; plt 243; glucose 84; bun 36; creat 1.45; k+ 4.0; na++134  Liver normal albumin 2.9  05-16-15: wbc 18.0; hgb 10.4; hct 31.4; mcv 93.2 ;plt 293; glucose 108; bun 26; creat 1.28; k+ 3.7; na++135; liver normal albumin 2.6; urine culture: no growth; blood culture: no growth 05-17-15: glucose 83; bun 23; creat 1.15; k+ 3.8; na++140  hgb a1c 7.0 05-19-15: wbc 20.2; hgb 9.0; hgb 27.3; mcv 95.5; plt 309; glucose 58; bun 13; creat 1.01; k+ 4.3; na++13 05-21-15: wbc 13.5; hgb 9.3; hct 28.3; mcv 95.3; plt 433 05-26-15: glucose 123; bun 13; creat 0.7; k+ 5.0; na++ 137  06-18-15: wbc 8.7;  hgb 11.2; hct 36.7; mcv 96.0; ;plt 278      Review of Systems Constitutional: Negative for appetite change and fatigue.  Respiratory: Negative for cough, chest tightness and shortness of breath.   Cardiovascular: Negative for chest pain, palpitations and leg swelling.  Gastrointestinal: Negative for nausea, abdominal pain, diarrhea and constipation.  Musculoskeletal: Negative for myalgias and arthralgias.  Skin: Negative for pallor.  Psychiatric/Behavioral: The patient is not nervous/anxious.       Physical Exam Constitutional: No distress.  Eyes: Conjunctivae are normal.  Neck: Neck supple. No JVD present. No thyromegaly present.  Cardiovascular: Normal rate, regular rhythm and intact distal pulses.   Respiratory: Effort normal and breath sounds normal. No respiratory distress. She has no wheezes.  GI: Soft. Bowel sounds are normal. She exhibits no distension. There is no tenderness.  Musculoskeletal: She exhibits no edema.  Able to move all extremities   Lymphadenopathy:    She has no cervical adenopathy.  Neurological: She is alert.  Skin: Skin is warm and dry. She is not diaphoretic.  Psychiatric: She has a normal mood and affect.     ASSESSMENT/ PLAN:  1. Hypertension: will continue lisinopril 5 mg daily; and lopressor 25 mg twice daily  Asa 325 mg daily    2. Diabetes: will continue lantus 25 units in the AM and  20 units in the PM; hgb a1c is 7.0  3. Depression: will continue colace daily prn;  miralax daily and senna s 2 tabs daily   4. Dyslipidemia: will continue lipitor 10 mg daily ldl is 71; will monitor  5. Osteoarthritis: pain is presently being managed; will continue percocet 5/325 mg every 4 hours as needed   6. Depression with psychosis: is presently stable is presently not taking medications; will monitor   7. Alzheimer's disease: no change in status; is presently not taking medications will not make changes will monitor her status.  Her current  weight is 163 pounds.   8. Nephrolithiasis; outlet obstruction: has foley; is followed by urology.          MD is aware of resident's narcotic use and is in agreement with current plan of care. We will attempt to wean resident as apropriate   Ok Edwards NP [facility]  Contact [phone] Monday through Friday 8am- 5pm  After hours call [phone]

## 2015-11-04 ENCOUNTER — Non-Acute Institutional Stay (SKILLED_NURSING_FACILITY): Payer: Medicare Other | Admitting: Internal Medicine

## 2015-11-04 ENCOUNTER — Encounter: Payer: Self-pay | Admitting: Internal Medicine

## 2015-11-04 DIAGNOSIS — I10 Essential (primary) hypertension: Secondary | ICD-10-CM | POA: Diagnosis not present

## 2015-11-04 DIAGNOSIS — E119 Type 2 diabetes mellitus without complications: Secondary | ICD-10-CM

## 2015-11-04 DIAGNOSIS — Z794 Long term (current) use of insulin: Secondary | ICD-10-CM

## 2015-11-04 DIAGNOSIS — G308 Other Alzheimer's disease: Secondary | ICD-10-CM

## 2015-11-04 DIAGNOSIS — N320 Bladder-neck obstruction: Secondary | ICD-10-CM | POA: Diagnosis not present

## 2015-11-04 DIAGNOSIS — F0280 Dementia in other diseases classified elsewhere without behavioral disturbance: Secondary | ICD-10-CM | POA: Diagnosis not present

## 2015-11-04 NOTE — Progress Notes (Signed)
Location:   [facility] Room Number: 115-B Place of Service:  SNF (31)  This is a routine visit.  Chief complaint-medical management of chronic medical issues including Alzheimer's dementia-diabetes 2-hypertension-depression-osteoarthritis-.  History of present illness.  Patient is a 80 year old female seen today for medical management of chronic issues as noted above-nursing does not report any recent concerns-patient does not have any complaints today she is resting in bed comfortably.  She does have a history of type 2 diabetes is on Lantus 25 units in the morning 20 units in the p.m. this appears stable with blood sugars largely in the 90s to low 100s.  She does have a history of Alzheimer's disease this appears to be relatively baseline does not take any medications for this-.  Regards to hypertension she is on lisinopril 2.5 mg daily Lopressor 25 mg twice a day appears recent systolics are in the [id number] range at this point will monitor.  Pulses appear to be in the 60s to 70s largely.  Past Medical History:  Diagnosis Date  . Acute pyelonephritis   . AKI (acute kidney injury) ([place])   . Alzheimer's dementia   . Alzheimer's disease   . Bladder outlet obstruction   . Depression   . Diarrhea   . Dyslipidemia   . Environmental and seasonal allergies   . Essential hypertension, benign   . Foley catheter in place   . GERD (gastroesophageal reflux disease)   . History of urinary tract infection   . Hypertension   . Hypothyroidism   . Insomnia   . Nephrolithiasis   . Osteoarthritis   . Reflux esophagitis   . Rickets, active   . Sepsis ([place])   . Type II or unspecified type diabetes mellitus without mention of complication, uncontrolled   . Unspecified constipation   . Unspecified psychosis   . Unspecified vitamin D deficiency          Past Surgical History:  Procedure Laterality Date  . ABDOMINAL HYSTERECTOMY    .  CYSTOSCOPY W/ URETERAL STENT PLACEMENT Bilateral 05/18/2015   Procedure: CYSTOSCOPY WITH RETROGRADE PYELOGRAM/URETERAL STENT PLACEMENT;  Surgeon: Carolan Clines, MD;  Location: [facility];  Service: Urology;  Laterality: Bilateral;  . CYSTOSCOPY W/ URETERAL STENT REMOVAL Bilateral 09/12/2015   Procedure: CYSTOSCOPY WITH STENT REMOVAL;  Surgeon: Carolan Clines, MD;  Location: [facility];  Service: Urology;  Laterality: Bilateral;  1 HOUR  . CYSTOSCOPY WITH RETROGRADE PYELOGRAM, URETEROSCOPY AND STENT PLACEMENT Bilateral 07/18/2015   Procedure: CYSTOSCOPY REMOVE LEFT DOUBLE J STENT LEFT RETROGRADE PYELOGRAM, LEFT URETEROSCOPY, PYELOSCOPY, REPLACEMENT OF LEFT DOUBLE J STENT;  Surgeon: Carolan Clines, MD;  Location: [facility];  Service: Urology;  Laterality: Bilateral;  . HOLMIUM LASER APPLICATION Bilateral [id number]   Procedure: HOLMIUM LASER OF LEFT LOWER URETERAL STONE, LEFT PYELOSCOPY, REPLACE LEFT DOUBLE J STENT REMOVE RIGHT DOUBLE J STENT RIGHT RETROGRADE PYELOGRAM, REPLACEMENT RIGHT DOUBLE J STENT ;  Surgeon: Carolan Clines, MD;  Location: [facility];  Service: Urology;  Laterality: Bilateral;  . KNEE ARTHROSCOPY Left 1997    Social History        Social History  . Marital status: Married    Spouse name: N/A  . Number of children: N/A  . Years of education: N/A      Occupational History  . Not on file.       Social History Main Topics  . Smoking status: Never Smoker  . Smokeless tobacco: Never Used  . Alcohol use No  . Drug use: No  .  Sexual activity: Not on file       Other Topics Concern  . Not on file   Social History Narrative  . No narrative on file        Family History  Problem Relation Age of Onset  . Family history unknown: Yes      VITAL SIGNS  Temperature 97.2 pulse 70 respirations 18 blood pressure 130/75 weight appears relatively stable at 165      Patient's Medications  New Prescriptions   No medications on file  Previous  Medications   ACETAMINOPHEN (TYLENOL) 325 MG TABLET    Take 650 mg by mouth every 6 (six) hours as needed for moderate pain.   ACETAMINOPHEN (TYLENOL) 500 MG TABLET    Take 1,000 mg by mouth daily. Pain management   ASPIRIN 325 MG EC TABLET    Take 325 mg by mouth daily.   ATORVASTATIN (LIPITOR) 10 MG TABLET    Take 10 mg by mouth daily at 6 PM.   BISACODYL (DULCOLAX) 5 MG EC TABLET    Take 10 mg by mouth daily as needed for moderate constipation.   CHOLECALCIFEROL (VITAMIN D) 1000 UNITS TABLET    Take 2,000 Units by mouth daily.   DOCUSATE SODIUM (COLACE) 100 MG CAPSULE    Take 100 mg by mouth daily as needed for mild constipation.    INSULIN GLARGINE (LANTUS) 100 UNIT/ML INJECTION    Inject 20-25 Units into the skin 2 (two) times daily. 20 units at bedtime and 25 units in the AM   LISINOPRIL (PRINIVIL,ZESTRIL) 5 MG TABLET    Take 2.5 mg by mouth one time a day   LORATADINE (CLARITIN) 10 MG TABLET    Take 10 mg by mouth daily as needed for allergies.    METOPROLOL TARTRATE (LOPRESSOR) 25 MG TABLET    Take 25 mg by mouth 2 (two) times daily. For HTN  Hold if B/P < 100/60 or HR < 60   NITROFURANTOIN (MACRODANTIN) 100 MG CAPSULE    Take 100 mg by mouth at bedtime.    NITROFURANTOIN, MACROCRYSTAL-MONOHYDRATE, (MACROBID) 100 MG CAPSULE    Take 1 capsule (100 mg total) by mouth at bedtime.   OXYCODONE-ACETAMINOPHEN (ROXICET) 5-325 MG TABLET    Take 1 tablet by mouth every 4 (four) hours as needed for severe pain.   PHENAZOPYRIDINE (PYRIDIUM) 200 MG TABLET    Take 200 mg by mouth every 8 (eight) hours as needed for pain.   POLYETHYLENE GLYCOL (MIRALAX / GLYCOLAX) PACKET    Take 17 g by mouth daily.    POLYVINYL ALCOHOL-POVIDONE (FRESHKOTE) 2.7-2 % SOLN    Apply 1 drop to eye 3 (three) times daily. To both eyes   SENNA-DOCUSATE (SENOKOT S) 8.6-50 MG PER TABLET    Take 2 tablets by mouth at bedtime.    SKIN PROTECTANTS, MISC. (CALAZIME SKIN PROTECTANT EX)    Apply 1 application  topically 2 (two) times daily.  Modified Medications   No medications on file  Discontinued Medications   GAUZE PADS & DRESSINGS (HYDROCELL DRESSING 4"X4") 4"X4" PADS    Apply 1 application topically every 3 (three) days. Right buttocks   OXYCODONE-ACETAMINOPHEN (PERCOCET) 5-325 MG TABLET    Take 1 tablet by mouth every 6 (six) hours as needed for severe pain.   PHENAZOPYRIDINE (PYRIDIUM) 200 MG TABLET    Take 1 tablet (200 mg total) by mouth 3 (three) times daily as needed for pain (note: discolor urine, stain clothing).     SIGNIFICANT  DIAGNOSTIC EXAMS  05-13-15: chest x-ray: no active disease is seen in chest  05-16-15: chest x-ray; Stable mild chronic bronchitic changes. No acute abnormality.  05-18-15: chest x-ray: No acute abnormality. Mild chronic interstitial lung disease.  05-18-15: ct of abdomen and pelvis: 1. Motion and hardware degraded images. 2. Moderate left-sided hydroureteronephrosis secondary to a distal left ureteric 11 x 13 mm calculus. Decreased left-sided renal function. 3. Large burden bilateral renal collecting system stones, including stones within both extrarenal pelves. 4. Cirrhosis with small volume ascites. 5. Proctitis, likely related to C. difficile colitis, given the clinical history. 6. Bladder wall irregularity which could relate to cystitis. Bladder diverticulum, suggesting a component of outlet obstruction. 7. Skin irregularity superficial to the coccyx. Recommend physical exam correlation to exclude decubitus ulcer. No gross osseous destruction.   LABS REVIEWED:   11-26-14: wbc 8.0; hgb 12.1; hct 39.0; mcv 95.7; plt 339; hgb a1c 7.1  12-11-14: vit d 37.32  02-05-15: chol 156; ldl 71; trig 207; hdl 44; tsh 5.01 03-06-15: wbc 8.9;  hgb 13.2; hct 41.1; mcv 93.8; plt 274; glucose 142; bun 15.2; creat 0.72; k+ 4.4; na++132; liver normal albumin 3.6; hgb a1c 7.3  05-14-15; wbc 15.6; hgb 11.1 ;hct 33.3; mcv 92.6; plt 243; glucose 84; bun 36; creat  1.45; k+ 4.0; na++134  Liver normal albumin 2.9  05-16-15: wbc 18.0; hgb 10.4; hct 31.4; mcv 93.2 ;plt 293; glucose 108; bun 26; creat 1.28; k+ 3.7; na++135; liver normal albumin 2.6; urine culture: no growth; blood culture: no growth 05-17-15: glucose 83; bun 23; creat 1.15; k+ 3.8; na++140  hgb a1c 7.0 05-19-15: wbc 20.2; hgb 9.0; hgb 27.3; mcv 95.5; plt 309; glucose 58; bun 13; creat 1.01; k+ 4.3; na++13 05-21-15: wbc 13.5; hgb 9.3; hct 28.3; mcv 95.3; plt 433 05-26-15: glucose 123; bun 13; creat 0.7; k+ 5.0; na++ 137  06-18-15: wbc 8.7; hgb 11.2; hct 36.7; mcv 96.0; ;plt 278   Sodium 137 potassium 5 BUN 13.3 creatinine 0.67     Review of Systems Constitutional: Negative for appetite change and fatigue.  Respiratory: Negative for cough, chest tightness and shortness of breath.   Cardiovascular: Negative for chest pain, palpitations and leg swelling.  Gastrointestinal: Negative for nausea, abdominal pain, diarrhea and constipation.  Musculoskeletal: Negative for myalgias and arthralgias.  Skin: Negative for pallor. She's or itching Psychiatric/Behavioral: The patient is not nervous/anxious.       Physical Exam Constitutional: No distress. Comfortably in bed Eyes: Conjunctivae are normal.  Neck: Neck supple. No JVD present. No thyromegaly present.  Cardiovascular: Normal rate, regular rhythm and intact distal pulses.-Distant heart sounds   Respiratory: Effort normal and breath sounds normal. No respiratory distress. She has no wheezes.  GI: Soft. Bowel sounds are normal. She exhibits no distension. There is no tenderness.  Musculoskeletal: She exhibits no edema.  Able to move all extremities   Lymphadenopathy:    She has no cervical adenopathy.  Neurological: She is alert. Conversant her speech is clear Skin: Skin is warm and dry. She is not diaphoretic.  Psychiatric: She has a normal mood and affect. Pleasant and cooperative with exam     ASSESSMENT/ PLAN:  1.  Hypertension: will continue lisinopril 2.5  mg daily; and lopressor 25 mg twice daily  Asa 325 mg daily    2. Diabetes: will continue lantus 25 units in the AM and  20 units in the PM; hgb a1c is 7.0  3. Depression: will continue colace daily prn;  miralax daily and senna  s 2 tabs daily   4. Dyslipidemia: will continue lipitor 10 mg daily ldl is 71; will monitor  5. Osteoarthritis: pain is presently being managed; will continue percocet 5/325 mg every 4 hours as needed   6. Depression with psychosis: is presently stable is presently not taking medications; will monitor   7. Alzheimer's disease: no change in status; is presently not taking medications will not make changes will monitor her status.  Her current weight is 165 pounds.   8. Nephrolithiasis; outlet obstruction: has foley; is followed by urology--continues on Macrobid for apparent prophylaxis  We will update CBC iandCMP for updated values to assess stability hemoglobin as well as liver function tests being on a statin-clinically she appears to be stable.  [id number].

## 2015-11-05 DIAGNOSIS — I10 Essential (primary) hypertension: Secondary | ICD-10-CM | POA: Diagnosis not present

## 2015-11-05 DIAGNOSIS — E784 Other hyperlipidemia: Secondary | ICD-10-CM | POA: Diagnosis not present

## 2015-11-05 DIAGNOSIS — R69 Illness, unspecified: Secondary | ICD-10-CM | POA: Diagnosis not present

## 2015-11-05 LAB — HEPATIC FUNCTION PANEL
ALT: 18 U/L (ref 7–35)
AST: 16 U/L (ref 13–35)
Alkaline Phosphatase: 67 U/L (ref 25–125)
Bilirubin, Total: 0.2 mg/dL

## 2015-11-05 LAB — BASIC METABOLIC PANEL
BUN: 16 mg/dL (ref 4–21)
CREATININE: 0.9 mg/dL (ref 0.5–1.1)
Glucose: 113 mg/dL
Potassium: 4.9 mmol/L (ref 3.4–5.3)
Sodium: 140 mmol/L (ref 137–147)

## 2015-11-05 LAB — CBC AND DIFFERENTIAL
HCT: 37 % (ref 36–46)
Hemoglobin: 12.5 g/dL (ref 12.0–16.0)
PLATELETS: 292 10*3/uL (ref 150–399)
WBC: 8.3 10*3/mL

## 2015-11-12 DIAGNOSIS — Z23 Encounter for immunization: Secondary | ICD-10-CM | POA: Diagnosis not present

## 2015-12-03 ENCOUNTER — Non-Acute Institutional Stay (SKILLED_NURSING_FACILITY): Payer: Medicare Other | Admitting: Internal Medicine

## 2015-12-03 DIAGNOSIS — E119 Type 2 diabetes mellitus without complications: Secondary | ICD-10-CM

## 2015-12-03 DIAGNOSIS — Z794 Long term (current) use of insulin: Secondary | ICD-10-CM | POA: Diagnosis not present

## 2015-12-03 DIAGNOSIS — F29 Unspecified psychosis not due to a substance or known physiological condition: Secondary | ICD-10-CM

## 2015-12-03 DIAGNOSIS — I10 Essential (primary) hypertension: Secondary | ICD-10-CM

## 2015-12-03 DIAGNOSIS — F0280 Dementia in other diseases classified elsewhere without behavioral disturbance: Secondary | ICD-10-CM | POA: Diagnosis not present

## 2015-12-03 DIAGNOSIS — N200 Calculus of kidney: Secondary | ICD-10-CM

## 2015-12-03 DIAGNOSIS — G308 Other Alzheimer's disease: Secondary | ICD-10-CM

## 2015-12-03 NOTE — Progress Notes (Signed)
This is a routine visit.  Level care skilled.  Facility is [facility]  This is a routine visit.  Chief complaint-medical management of chronic medical issues including Alzheimer's dementia-diabetes 2-hypertension-depression-osteoarthritis-.  History of present illness.  Patient is a 80 year old female seen today for medical management of chronic issues as noted above-nursing does not report any recent concerns-patient does not have any complaints today she is resting in bed comfortably.  She does have a history of type 2 diabetes is on Lantus 25 units in the morning 20 units in the p.m. this appears stable with blood sugars continuing to run largely in the 90s to low 100s.  She does have a history of Alzheimer's disease this appears to be relatively baseline does not take any medications for this-. She occasionally has agitation with exams but this is not new  Regards to hypertension she is on lisinopril 2.5 mg daily Lopressor 25 mg twice a day this appears to be stable most recent blood pressure 128/67 and this appears relatively baseline  Pulses appear to be in the 60s to 70s largely.      Past Medical History:  Diagnosis Date  . Acute pyelonephritis   . AKI (acute kidney injury) ([place])   . Alzheimer's dementia   . Alzheimer's disease   . Bladder outlet obstruction   . Depression   . Diarrhea   . Dyslipidemia   . Environmental and seasonal allergies   . Essential hypertension, benign   . Foley catheter in place   . GERD (gastroesophageal reflux disease)   . History of urinary tract infection   . Hypertension   . Hypothyroidism   . Insomnia   . Nephrolithiasis   . Osteoarthritis   . Reflux esophagitis   . Rickets, active   . Sepsis ([place])   . Type II or unspecified type diabetes mellitus without mention of complication, uncontrolled   . Unspecified constipation   . Unspecified psychosis   . Unspecified vitamin D  deficiency          Past Surgical History:  Procedure Laterality Date  . ABDOMINAL HYSTERECTOMY    . CYSTOSCOPY W/ URETERAL STENT PLACEMENT Bilateral 05/18/2015   Procedure: CYSTOSCOPY WITH RETROGRADE PYELOGRAM/URETERAL STENT PLACEMENT; Surgeon: Carolan Clines, MD; Location: [facility]; Service: Urology; Laterality: Bilateral;  . CYSTOSCOPY W/ URETERAL STENT REMOVAL Bilateral 09/12/2015   Procedure: CYSTOSCOPY WITH STENT REMOVAL; Surgeon: Carolan Clines, MD; Location: [facility]; Service: Urology; Laterality: Bilateral; 1 HOUR  . CYSTOSCOPY WITH RETROGRADE PYELOGRAM, URETEROSCOPY AND STENT PLACEMENT Bilateral 07/18/2015   Procedure: CYSTOSCOPY REMOVE LEFT DOUBLE J STENT LEFT RETROGRADE PYELOGRAM, LEFT URETEROSCOPY, PYELOSCOPY, REPLACEMENT OF LEFT DOUBLE J STENT; Surgeon: Carolan Clines, MD; Location: [facility]; Service: Urology; Laterality: Bilateral;  . HOLMIUM LASER APPLICATION Bilateral [id number]   Procedure: HOLMIUM LASER OF LEFT LOWER URETERAL STONE, LEFT PYELOSCOPY, REPLACE LEFT DOUBLE J STENT REMOVE RIGHT DOUBLE J STENT RIGHT RETROGRADE PYELOGRAM, REPLACEMENT RIGHT DOUBLE J STENT ; Surgeon: Carolan Clines, MD; Location: [facility]; Service: Urology; Laterality: Bilateral;  . KNEE ARTHROSCOPY Left 1997    Social History        Social History  . Marital status: Married    Spouse name: N/A  . Number of children: N/A  . Years of education: N/A      Occupational History  . Not on file.       Social History Main Topics  . Smoking status: Never Smoker  . Smokeless tobacco: Never Used  . Alcohol use No  .  Drug use: No  . Sexual activity: Not on file       Other Topics Concern  . Not on file      Social History Narrative  . No narrative on file        Family History  Problem Relation Age of Onset  . Family history unknown: Yes      VITAL SIGNS Temperature 96.8 pulse 72 respirations 18 blood pressure 128/67  weight is 266      Patient's Medications  New Prescriptions   No medications on file  Previous Medications   ACETAMINOPHEN (TYLENOL) 325 MG TABLET Take 650 mg by mouth every 6 (six) hours as needed for moderate pain.   ACETAMINOPHEN (TYLENOL) 500 MG TABLET Take 1,000 mg by mouth daily. Pain management   ASPIRIN 325 MG EC TABLET Take 325 mg by mouth daily.   ATORVASTATIN (LIPITOR) 10 MG TABLET Take 10 mg by mouth daily at 6 PM.   BISACODYL (DULCOLAX) 5 MG EC TABLET Take 10 mg by mouth daily as needed for moderate constipation.   CHOLECALCIFEROL (VITAMIN D) 1000 UNITS TABLET Take 2,000 Units by mouth daily.   DOCUSATE SODIUM (COLACE) 100 MG CAPSULE Take 100 mg by mouth daily as needed for mild constipation.    INSULIN GLARGINE (LANTUS) 100 UNIT/ML INJECTION Inject 20-25 Units into the skin 2 (two) times daily. 20 units at bedtime and 25 units in the AM   LISINOPRIL (PRINIVIL,ZESTRIL) 5 MG TABLET Take 2.5 mg by mouth one time a day   LORATADINE (CLARITIN) 10 MG TABLET Take 10 mg by mouth daily as needed for allergies.    METOPROLOL TARTRATE (LOPRESSOR) 25 MG TABLET Take 25 mg by mouth 2 (two) times daily. For HTN Hold if B/P <100/60 or HR <60   NITROFURANTOIN (MACRODANTIN) 100 MG CAPSULE Take 100 mg by mouth at bedtime.    NITROFURANTOIN, MACROCRYSTAL-MONOHYDRATE, (MACROBID) 100 MG CAPSULE Take 1 capsule (100 mg total) by mouth at bedtime.   OXYCODONE-ACETAMINOPHEN (ROXICET) 5-325 MG TABLET Take 1 tablet by mouth every 4 (four) hours as needed for severe pain.   PHENAZOPYRIDINE (PYRIDIUM) 200 MG TABLET Take 200 mg by mouth every 8 (eight) hours as needed for pain.   POLYETHYLENE GLYCOL (MIRALAX / GLYCOLAX) PACKET Take 17 g by mouth daily.    POLYVINYL ALCOHOL-POVIDONE (FRESHKOTE) 2.7-2 % SOLN Apply 1 drop to eye 3 (three) times daily. To both eyes   SENNA-DOCUSATE (SENOKOT S) 8.6-50 MG PER TABLET Take 2 tablets by  mouth at bedtime.    SKIN PROTECTANTS, MISC. (CALAZIME SKIN PROTECTANT EX) Apply 1 application topically 2 (two) times daily.  Modified Medications   No medications on file  Discontinued Medications   GAUZE PADS & DRESSINGS (HYDROCELL DRESSING 4"X4") 4"X4" PADS Apply 1 application topically every 3 (three) days. Right buttocks   OXYCODONE-ACETAMINOPHEN (PERCOCET) 5-325 MG TABLET Take 1 tablet by mouth every 6 (six) hours as needed for severe pain.   PHENAZOPYRIDINE (PYRIDIUM) 200 MG TABLET Take 1 tablet (200 mg total) by mouth 3 (three) times daily as needed for pain (note: discolor urine, stain clothing).     SIGNIFICANT DIAGNOSTIC EXAMS  05-13-15: chest x-ray: no active disease is seen in chest  05-16-15: chest x-ray; Stable mild chronic bronchitic changes.No acute abnormality.  05-18-15: chest x-ray: No acute abnormality.Mild chronic interstitial lung disease.  05-18-15: ct of abdomen and pelvis: 1. Motion and hardware degraded images. 2. Moderate left-sided hydroureteronephrosis secondary to a distal left ureteric 11 x 13 mm calculus. Decreased left-sided  renal function. 3. Large burden bilateral renal collecting system stones, including stones within both extrarenal pelves. 4. Cirrhosis with small volume ascites. 5. Proctitis, likely related to C. difficile colitis, given the clinical history. 6. Bladder wall irregularity which could relate to cystitis. Bladder diverticulum, suggesting a component of outlet obstruction. 7. Skin irregularity superficial to the coccyx. Recommend physical exam correlation to exclude decubitus ulcer. No gross osseous destruction.   LABS REVIEWED:   11-26-14: wbc 8.0; hgb 12.1; hct 39.0; mcv 95.7; plt 339; hgb a1c 7.1  12-11-14: vit d 37.32  02-05-15: chol 156; ldl 71; trig 207; hdl 44; tsh 5.01 03-06-15: wbc 8.9; hgb 13.2; hct 41.1; mcv 93.8; plt 274; glucose 142; bun 15.2; creat 0.72; k+ 4.4; na++132; liver normal albumin  3.6; hgb a1c 7.3  05-14-15; wbc 15.6; hgb 11.1 ;hct 33.3; mcv 92.6; plt 243; glucose 84; bun 36; creat 1.45; k+ 4.0; na++134 Liver normal albumin 2.9  05-16-15: wbc 18.0; hgb 10.4; hct 31.4; mcv 93.2 ;plt 293; glucose 108; bun 26; creat 1.28; k+ 3.7; na++135; liver normal albumin 2.6; urine culture: no growth; blood culture: no growth 05-17-15: glucose 83; bun 23; creat 1.15; k+ 3.8; na++140 hgb a1c 7.0 05-19-15: wbc 20.2; hgb 9.0; hgb 27.3; mcv 95.5; plt 309; glucose 58; bun 13; creat 1.01; k+ 4.3; na++13 05-21-15: wbc 13.5; hgb 9.3; hct 28.3; mcv 95.3; plt 433 05-26-15: glucose 123; bun 13; creat 0.7; k+ 5.0; na++ 137  06-18-15: wbc 8.7; hgb 11.2; hct 36.7; mcv 96.0; ;plt 278   Sodium 137 potassium 5 BUN 13.3 creatinine 0.67  11/05/2015.  WBC 8.3 hemoglobin 12.5 platelets 292.  Sodium 140 potassium 4.9 BUN 15.9 creatinine 0.86.  Liver function tests within normal limits     Review of Systems Constitutional: Negative for appetite change and fatigue.  Respiratory: Negative for cough, chest tightness and shortness of breath.  Cardiovascular: Negative for chest pain, palpitations and leg swelling.  Gastrointestinal: Negative for nausea, abdominal pain, diarrhea and constipation.  Musculoskeletal: Negative for myalgias and arthralgias.  Skin: Negative for pallor or itching Psychiatric/Behavioral: The patient is not nervous/anxious. At times can be a little bit agitated with invasive maneuvers-exams     Physical Exam Constitutional: No distress   lying. Comfortably in bed Eyes: Conjunctivae are normal.  Neck: Neck supple. No JVD present. No thyromegaly present.  Cardiovascular: Normal rate, regular rhythm and intact distal pulses.-Distant heart sounds  Respiratory: Effort normal and breath sounds normal. No respiratory distress. She has no wheezes.  GI: Soft. Bowel sounds are normal. She exhibits no distension. There is mild tenderness diffuse this appears more reaction to  invasive maneuver other than any acute tenderness.  Musculoskeletal: She exhibits no edema.  Able to move all extremities  Lymphadenopathy:  She has no cervical adenopathy.  Neurological: She is alert. Conversant her speech is clear Skin: Skin is warm and dry. She is not diaphoretic.  Psychiatric: She has a normal mood and affect. Pleasant and cooperative with exam  today     ASSESSMENT/ PLAN:  1. Hypertension: will continue lisinopril 2.5  mg daily; and lopressor 25 mg twice daily Asa 325 mg daily   2. Diabetes: will continue lantus 25 units in the AM and 20 units in the PM; hgb a1c is 7.0  3. Depression: will continue colace daily prn; miralax daily and senna s 2 tabs daily   4. Dyslipidemia: will continue lipitor 10 mg daily ldl is 71; will monitor  5. Osteoarthritis: pain is presently being managed; will  continue percocet 5/325 mg every 4 hours as needed   6. Depression with psychosis: is presently stable is presently not taking medications; will monitor -was pleasant and cooperative with exam today  7. Alzheimer's disease: no change in status; is presently not taking medications will not make changes will monitor her status. Her current weight is 266pounds.   8. Nephrolithiasis; outlet obstruction: has foley; is followed by urology--continues on Macrobid for apparent prophylaxis     [id number].

## 2015-12-05 ENCOUNTER — Other Ambulatory Visit: Payer: Self-pay | Admitting: *Deleted

## 2015-12-05 MED ORDER — OXYCODONE-ACETAMINOPHEN 5-325 MG PO TABS: 1.0000 | ORAL_TABLET | ORAL | 0 refills | Status: DC | PRN

## 2015-12-05 NOTE — Telephone Encounter (Signed)
[facility] #[phone] Fax:[phone]  

## 2016-01-08 ENCOUNTER — Non-Acute Institutional Stay (SKILLED_NURSING_FACILITY): Payer: Medicare Other | Admitting: Internal Medicine

## 2016-01-08 ENCOUNTER — Encounter: Payer: Self-pay | Admitting: Internal Medicine

## 2016-01-08 DIAGNOSIS — F0280 Dementia in other diseases classified elsewhere without behavioral disturbance: Secondary | ICD-10-CM | POA: Diagnosis not present

## 2016-01-08 DIAGNOSIS — M150 Primary generalized (osteo)arthritis: Secondary | ICD-10-CM

## 2016-01-08 DIAGNOSIS — E034 Atrophy of thyroid (acquired): Secondary | ICD-10-CM | POA: Diagnosis not present

## 2016-01-08 DIAGNOSIS — R062 Wheezing: Secondary | ICD-10-CM

## 2016-01-08 DIAGNOSIS — F29 Unspecified psychosis not due to a substance or known physiological condition: Secondary | ICD-10-CM | POA: Diagnosis not present

## 2016-01-08 DIAGNOSIS — E119 Type 2 diabetes mellitus without complications: Secondary | ICD-10-CM

## 2016-01-08 DIAGNOSIS — J3089 Other allergic rhinitis: Secondary | ICD-10-CM | POA: Diagnosis not present

## 2016-01-08 DIAGNOSIS — G308 Other Alzheimer's disease: Secondary | ICD-10-CM | POA: Diagnosis not present

## 2016-01-08 DIAGNOSIS — M159 Polyosteoarthritis, unspecified: Secondary | ICD-10-CM

## 2016-01-08 DIAGNOSIS — I10 Essential (primary) hypertension: Secondary | ICD-10-CM | POA: Diagnosis not present

## 2016-01-08 DIAGNOSIS — Z794 Long term (current) use of insulin: Secondary | ICD-10-CM

## 2016-01-08 NOTE — Progress Notes (Signed)
Patient ID: Ana Newman, female   DOB: 07-06-1935, 80 y.o.   MRN: [id number]    DATE:  01/08/2016  Location:    [place] Room Number: 115 B Place of Service: SNF (31)   Extended Emergency Contact Information Primary Emergency Contact: Smith,Christie Address: [address] [address] [place] Phone: [phone] Relation: Spouse Secondary Emergency Contact: Allen Norris [place] Phone: [phone] Mobile Phone: [phone] Relation: Son  Advanced Directive information Does Patient Have a Medical Advance Directive?: Yes, Type of Advance Directive: Out of facility DNR (pink MOST or yellow form) (Attempt CPR), Pre-existing out of facility DNR order (yellow form or pink MOST form): Pink MOST form placed in chart (order not valid for inpatient use), Does patient want to make changes to medical advance directive?: No - Patient declined  Chief Complaint  Patient presents with  . Medical Management of Chronic Issues    Routine visit    HPI:  80 yo female long term resident seen today for f/u. She was seen by urology and had stones removed. Foley cath also d/c'd. She is voiding without difficulty and drinking plenty of fluids. No other concerns. No falls. She has regular BMs. She is a poor historian due to dementia. Hx obtained from chart  Hypertension - stable on lisinopril 2.5  mg daily; and lopressor 25 mg twice daily;Asa 325 mg daily   DM - stable on lantus 25 units in the AM and 20 units in the PM; hgb a1c is 7.0  Seasonal allergy - takes claritin daily  Constipation - stable on colace daily prn; miralax daily and senna s 2 tabs daily   Dyslipidemia - stable on lipitor 10 mg daily; LDL 71  Osteoarthritis - controlled on percocet 5/325 mg every 4 hours as needed   Depression with psychosis - stable off medications  Alzheimer's disease - stable. She does not take any medications.  current  weight is 266 lb.   Hx Nephrolithiasis with outlet obstruction - s/p chronic foley cath; followed by urology; takes Palmer for apparent prophylaxis    Past Medical History:  Diagnosis Date  . Acute pyelonephritis   . AKI (acute kidney injury) ([place])   . Alzheimer's dementia   . Alzheimer's disease   . Bladder outlet obstruction   . Depression   . Diarrhea   . Dyslipidemia   . Environmental and seasonal allergies   . Essential hypertension, benign   . Foley catheter in place   . GERD (gastroesophageal reflux disease)   . History of urinary tract infection   . Hypertension   . Hypothyroidism   . Insomnia   . Nephrolithiasis   . Osteoarthritis   . Reflux esophagitis   . Rickets, active   . Sepsis ([place])   . Type II or unspecified type diabetes mellitus without mention of complication, uncontrolled   . Unspecified constipation   . Unspecified psychosis   . Unspecified vitamin D deficiency     Past Surgical History:  Procedure Laterality Date  . ABDOMINAL HYSTERECTOMY    . CYSTOSCOPY W/ URETERAL STENT PLACEMENT Bilateral 05/18/2015   Procedure: CYSTOSCOPY WITH RETROGRADE PYELOGRAM/URETERAL STENT PLACEMENT;  Surgeon: Carolan Clines, MD;  Location: [facility];  Service: Urology;  Laterality: Bilateral;  . CYSTOSCOPY W/ URETERAL STENT REMOVAL Bilateral 09/12/2015   Procedure: CYSTOSCOPY WITH STENT REMOVAL;  Surgeon: Carolan Clines, MD;  Location: [facility];  Service: Urology;  Laterality:  Bilateral;  1 HOUR  . CYSTOSCOPY WITH RETROGRADE PYELOGRAM, URETEROSCOPY AND STENT PLACEMENT Bilateral 07/18/2015   Procedure: CYSTOSCOPY REMOVE LEFT DOUBLE J STENT LEFT RETROGRADE PYELOGRAM, LEFT URETEROSCOPY, PYELOSCOPY, REPLACEMENT OF LEFT DOUBLE J STENT;  Surgeon: Carolan Clines, MD;  Location: [facility];  Service: Urology;  Laterality: Bilateral;  . HOLMIUM LASER APPLICATION Bilateral [id number]   Procedure: HOLMIUM LASER OF LEFT LOWER URETERAL STONE, LEFT PYELOSCOPY, REPLACE LEFT DOUBLE J  STENT REMOVE RIGHT DOUBLE J STENT RIGHT RETROGRADE PYELOGRAM, REPLACEMENT RIGHT DOUBLE J STENT ;  Surgeon: Carolan Clines, MD;  Location: [facility];  Service: Urology;  Laterality: Bilateral;  . KNEE ARTHROSCOPY Left 1997    Patient Care Team: Gildardo Cranker, DO as PCP - General (Internal Medicine) Gerlene Fee, NP as Nurse Practitioner (Geriatric Medicine) [facility]  Social History   Social History  . Marital status: Married    Spouse name: N/A  . Number of children: N/A  . Years of education: N/A   Occupational History  . Not on file.   Social History Main Topics  . Smoking status: Never Smoker  . Smokeless tobacco: Never Used  . Alcohol use No  . Drug use: No  . Sexual activity: Not on file   Other Topics Concern  . Not on file   Social History Narrative  . No narrative on file     reports that she has never smoked. She has never used smokeless tobacco. She reports that she does not drink alcohol or use drugs.  Family History  Problem Relation Age of Onset  . Family history unknown: Yes   Family Status  Relation Status  . Daughter Alive    Immunization History  Administered Date(s) Administered  . Hepatitis B 11/12/2015  . Influenza Whole 11/09/2012  . Influenza-Unspecified 11/12/2013, 11/11/2014, 11/12/2015  . PPD Test 10/10/2015, 10/17/2015  . Pneumococcal-Unspecified 01/03/2008    No Known Allergies  Medications: Patient's Medications  New Prescriptions   No medications on file  Previous Medications   ACETAMINOPHEN (TYLENOL) 325 MG TABLET    Take 650 mg by mouth every 6 (six) hours as needed for moderate pain.   ACETAMINOPHEN (TYLENOL) 500 MG TABLET    Take 1,000 mg by mouth daily. Pain management   ASPIRIN 325 MG EC TABLET    Take 325 mg by mouth daily.   ATORVASTATIN (LIPITOR) 10 MG TABLET    Take 10 mg by mouth daily at 6 PM.   BISACODYL (DULCOLAX) 5 MG EC TABLET    Take 10 mg by mouth daily as  needed for moderate constipation.   CHOLECALCIFEROL (VITAMIN D) 1000 UNITS TABLET    Take 2,000 Units by mouth daily.   DOCUSATE SODIUM (COLACE) 100 MG CAPSULE    Take 100 mg by mouth daily as needed for mild constipation.    INSULIN GLARGINE (LANTUS) 100 UNIT/ML INJECTION    Inject 20-25 Units into the skin 2 (two) times daily. 20 units at bedtime and 25 units in the AM   LISINOPRIL (PRINIVIL,ZESTRIL) 5 MG TABLET    Take 5 mg by mouth daily.    LORATADINE (CLARITIN) 10 MG TABLET    Take 10 mg by mouth daily as needed for allergies.    MAGNESIUM HYDROXIDE (MILK OF MAGNESIA) 400 MG/5ML SUSPENSION    Take 45 mLs by mouth daily as needed for mild constipation.   METOPROLOL TARTRATE (LOPRESSOR) 25 MG TABLET    Take 25 mg by mouth 2 (two) times daily.  For HTN  Hold if B/P < 100/60 or HR < 60   NITROFURANTOIN, MACROCRYSTAL-MONOHYDRATE, (MACROBID) 100 MG CAPSULE    Take 1 capsule (100 mg total) by mouth at bedtime.   NYSTATIN (MYCOSTATIN/NYSTOP) POWDER    Apply under breast two times daily for rash   OMEPRAZOLE (PRILOSEC) 20 MG CAPSULE    Take 20 mg by mouth daily.   ONDANSETRON (ZOFRAN) 8 MG TABLET    Take 8 mg by mouth every 8 (eight) hours as needed for nausea or vomiting.   PHENAZOPYRIDINE (PYRIDIUM) 200 MG TABLET    Take 200 mg by mouth every 8 (eight) hours as needed for pain.   POLYVINYL ALCOHOL-POVIDONE (FRESHKOTE) 2.7-2 % SOLN    Apply 1 drop to eye 3 (three) times daily. To both eyes   PROMETHAZINE (PHENERGAN) 25 MG TABLET    Take 25 mg by mouth every 6 (six) hours as needed for nausea or vomiting.   SACCHAROMYCES BOULARDII (FLORASTOR PO)    Take 1 tablet by mouth 2 (two) times daily.   SENNA-DOCUSATE (SENOKOT S) 8.6-50 MG PER TABLET    Take 2 tablets by mouth at bedtime.    SKIN PROTECTANTS, MISC. (CALAZIME SKIN PROTECTANT EX)    Apply 1 application topically 2 (two) times daily.  Modified Medications   Modified Medication Previous Medication   OXYCODONE-ACETAMINOPHEN (ROXICET) 5-325 MG  TABLET oxyCODONE-acetaminophen (ROXICET) 5-325 MG tablet      Take 1 tablet by mouth every 4 (four) hours as needed for severe pain. Not to exceed 3000mg  of APAP in 24 hours    Take 1 tablet by mouth every 4 (four) hours as needed for severe pain. Not to exceed 3000mg  of APAP in 24 hours  Discontinued Medications   NITROFURANTOIN (MACRODANTIN) 100 MG CAPSULE    Take 100 mg by mouth at bedtime.    POLYETHYLENE GLYCOL (MIRALAX / GLYCOLAX) PACKET    Take 17 g by mouth daily.     Review of Systems  Unable to perform ROS: Dementia    Vitals:   01/08/16 2035  BP: 128/67  Pulse: 72  Weight: 266 lb (120.7 kg)   Body mass index is 53.73 kg/m.  Physical Exam  Constitutional: She appears well-developed.  Frail appearing sitting up in bed in NAD  HENT:  Mouth/Throat: Oropharynx is clear and moist. No oropharyngeal exudate.  Eyes: Pupils are equal, round, and reactive to light. No scleral icterus.  Neck: Neck supple. Carotid bruit is not present. No tracheal deviation present. No thyromegaly present.  Cardiovascular: Normal rate, regular rhythm and intact distal pulses.  Exam reveals no gallop and no friction rub.   Murmur (1/6 SEM) heard. No LE edema b/l. no calf TTP.   Pulmonary/Chest: Effort normal. No stridor. No respiratory distress. She has wheezes (end expiratory b/l but no prolonged expiratory phase). She has no rales.  Abdominal: Soft. Bowel sounds are normal. She exhibits no distension and no mass. There is no hepatomegaly. There is no tenderness. There is no rebound and no guarding.  obese  Musculoskeletal:  Left foot contracture  Lymphadenopathy:    She has no cervical adenopathy.  Neurological: She is alert.  Skin: Skin is warm and dry. No rash noted.  Psychiatric: She has a normal mood and affect. Her behavior is normal.     Labs reviewed: Nursing Home on 01/08/2016  Component Date Value Ref Range Status  . Hemoglobin 11/05/2015 12.5  12.0 - 16.0 g/dL Final  . HCT  11/05/2015 37  36 -  46 % Final  . Platelets 11/05/2015 292  150 - 399 K/L Final  . WBC 11/05/2015 8.3  10^3/mL Final  . Glucose 11/05/2015 113  mg/dL Final  . BUN 11/05/2015 16  4 - 21 mg/dL Final  . Creatinine 11/05/2015 0.9  0.5 - 1.1 mg/dL Final  . Potassium 11/05/2015 4.9  3.4 - 5.3 mmol/L Final  . Sodium 11/05/2015 140  137 - 147 mmol/L Final  . Alkaline Phosphatase 11/05/2015 67  25 - 125 U/L Final  . ALT 11/05/2015 18  7 - 35 U/L Final  . AST 11/05/2015 16  13 - 35 U/L Final  . Bilirubin, Total 11/05/2015 0.2  mg/dL Final    No results found.   Assessment/Plan   ICD-9-CM ICD-10-CM   1. Wheezing 786.07 R06.2    related to #2  2. Environmental and seasonal allergies 477.8 J30.89   3. Alzheimer's disease of other onset without behavioral disturbance 331.0 G30.8    294.10 F02.80   4. Diabetes mellitus, type II, insulin dependent (HCC) 250.00 E11.9    V58.67 Z79.4   5. Psychosis, unspecified psychosis type 298.9 F29   6. Essential hypertension, benign 401.1 I10   7. Primary osteoarthritis involving multiple joints 715.09 M15.0   8. Hypothyroidism due to acquired atrophy of thyroid 244.8 E03.4    246.8     No changes made to medical regimen  Cont current meds as ordered  F/u with specialists as scheduled  PT/OT/ST as indicated  Will follow  Shown Dissinger S. Perlie Gold  [facility] [address] [place], [place] [address] [phone] Cell (Monday-Friday 8 AM - 5 PM) [phone] After 5 PM and follow prompts

## 2016-01-12 DIAGNOSIS — H04123 Dry eye syndrome of bilateral lacrimal glands: Secondary | ICD-10-CM | POA: Diagnosis not present

## 2016-01-12 DIAGNOSIS — Z961 Presence of intraocular lens: Secondary | ICD-10-CM | POA: Diagnosis not present

## 2016-01-12 DIAGNOSIS — H353131 Nonexudative age-related macular degeneration, bilateral, early dry stage: Secondary | ICD-10-CM | POA: Diagnosis not present

## 2016-02-17 ENCOUNTER — Non-Acute Institutional Stay (SKILLED_NURSING_FACILITY): Payer: Medicare Other | Admitting: Adult Health

## 2016-02-17 DIAGNOSIS — F329 Major depressive disorder, single episode, unspecified: Secondary | ICD-10-CM | POA: Diagnosis not present

## 2016-02-17 DIAGNOSIS — E034 Atrophy of thyroid (acquired): Secondary | ICD-10-CM

## 2016-02-17 DIAGNOSIS — Z794 Long term (current) use of insulin: Secondary | ICD-10-CM | POA: Diagnosis not present

## 2016-02-17 DIAGNOSIS — E1169 Type 2 diabetes mellitus with other specified complication: Secondary | ICD-10-CM

## 2016-02-17 DIAGNOSIS — G308 Other Alzheimer's disease: Secondary | ICD-10-CM

## 2016-02-17 DIAGNOSIS — F0280 Dementia in other diseases classified elsewhere without behavioral disturbance: Secondary | ICD-10-CM

## 2016-02-17 DIAGNOSIS — E119 Type 2 diabetes mellitus without complications: Secondary | ICD-10-CM

## 2016-02-17 DIAGNOSIS — I10 Essential (primary) hypertension: Secondary | ICD-10-CM | POA: Diagnosis not present

## 2016-02-17 DIAGNOSIS — E785 Hyperlipidemia, unspecified: Secondary | ICD-10-CM

## 2016-02-17 DIAGNOSIS — N390 Urinary tract infection, site not specified: Secondary | ICD-10-CM | POA: Diagnosis not present

## 2016-02-17 DIAGNOSIS — F32A Depression, unspecified: Secondary | ICD-10-CM

## 2016-02-18 DIAGNOSIS — E875 Hyperkalemia: Secondary | ICD-10-CM | POA: Diagnosis not present

## 2016-02-18 DIAGNOSIS — E784 Other hyperlipidemia: Secondary | ICD-10-CM | POA: Diagnosis not present

## 2016-02-18 DIAGNOSIS — Z79899 Other long term (current) drug therapy: Secondary | ICD-10-CM | POA: Diagnosis not present

## 2016-02-18 DIAGNOSIS — R69 Illness, unspecified: Secondary | ICD-10-CM | POA: Diagnosis not present

## 2016-02-18 DIAGNOSIS — N390 Urinary tract infection, site not specified: Secondary | ICD-10-CM | POA: Diagnosis not present

## 2016-02-18 DIAGNOSIS — E169 Disorder of pancreatic internal secretion, unspecified: Secondary | ICD-10-CM | POA: Diagnosis not present

## 2016-02-18 DIAGNOSIS — E1165 Type 2 diabetes mellitus with hyperglycemia: Secondary | ICD-10-CM | POA: Diagnosis not present

## 2016-02-18 DIAGNOSIS — I10 Essential (primary) hypertension: Secondary | ICD-10-CM | POA: Diagnosis not present

## 2016-03-10 ENCOUNTER — Encounter: Payer: Self-pay | Admitting: Adult Health

## 2016-03-10 NOTE — Progress Notes (Signed)
Location:   [facility]   Place of Service:  SNF (31)   CODE STATUS: full code   No Known Allergies  Chief Complaint  Patient presents with  . Medical Management of Chronic Issues    HPI:  She is a long term resident of this facility being seen for the management of her chronic illnesses. Overall her status is stable; she tells me that she is feeling good. There are no nursing concerns at this time.    Past Medical History:  Diagnosis Date  . Acute pyelonephritis   . AKI (acute kidney injury) ([place])   . Alzheimer's dementia   . Alzheimer's disease   . Bladder outlet obstruction   . Depression   . Diarrhea   . Dyslipidemia   . Environmental and seasonal allergies   . Essential hypertension, benign   . Foley catheter in place   . GERD (gastroesophageal reflux disease)   . History of urinary tract infection   . Hypertension   . Hypothyroidism   . Insomnia   . Nephrolithiasis   . Osteoarthritis   . Reflux esophagitis   . Rickets, active   . Sepsis ([place])   . Type II or unspecified type diabetes mellitus without mention of complication, uncontrolled   . Unspecified constipation   . Unspecified psychosis   . Unspecified vitamin D deficiency     Past Surgical History:  Procedure Laterality Date  . ABDOMINAL HYSTERECTOMY    . CYSTOSCOPY W/ URETERAL STENT PLACEMENT Bilateral 05/18/2015   Procedure: CYSTOSCOPY WITH RETROGRADE PYELOGRAM/URETERAL STENT PLACEMENT;  Surgeon: Carolan Clines, MD;  Location: [facility];  Service: Urology;  Laterality: Bilateral;  . CYSTOSCOPY W/ URETERAL STENT REMOVAL Bilateral 09/12/2015   Procedure: CYSTOSCOPY WITH STENT REMOVAL;  Surgeon: Carolan Clines, MD;  Location: [facility];  Service: Urology;  Laterality: Bilateral;  1 HOUR  . CYSTOSCOPY WITH RETROGRADE PYELOGRAM, URETEROSCOPY AND STENT PLACEMENT Bilateral 07/18/2015   Procedure: CYSTOSCOPY REMOVE LEFT DOUBLE J STENT LEFT RETROGRADE PYELOGRAM, LEFT URETEROSCOPY, PYELOSCOPY, REPLACEMENT OF  LEFT DOUBLE J STENT;  Surgeon: Carolan Clines, MD;  Location: [facility];  Service: Urology;  Laterality: Bilateral;  . HOLMIUM LASER APPLICATION Bilateral [id number]   Procedure: HOLMIUM LASER OF LEFT LOWER URETERAL STONE, LEFT PYELOSCOPY, REPLACE LEFT DOUBLE J STENT REMOVE RIGHT DOUBLE J STENT RIGHT RETROGRADE PYELOGRAM, REPLACEMENT RIGHT DOUBLE J STENT ;  Surgeon: Carolan Clines, MD;  Location: [facility];  Service: Urology;  Laterality: Bilateral;  . KNEE ARTHROSCOPY Left 1997    Social History   Social History  . Marital status: Married    Spouse name: N/A  . Number of children: N/A  . Years of education: N/A   Occupational History  . Not on file.   Social History Main Topics  . Smoking status: Never Smoker  . Smokeless tobacco: Never Used  . Alcohol use No  . Drug use: No  . Sexual activity: Not on file   Other Topics Concern  . Not on file   Social History Narrative  . No narrative on file   Family History  Problem Relation Age of Onset  . Family history unknown: Yes      VITAL SIGNS BP 134/66   Pulse 70   Temp 97.2 F (36.2 C)   Resp 18   Ht 4\' 11"  (1.499 m)   Wt 266 lb (120.7 kg)   SpO2 96%   BMI 53.73 kg/m   Patient's Medications  New Prescriptions   No medications on file  Previous Medications  ACETAMINOPHEN (TYLENOL) 325 MG TABLET    Take 650 mg by mouth every 6 (six) hours as needed for moderate pain.   ACETAMINOPHEN (TYLENOL) 500 MG TABLET    Take 1,000 mg by mouth daily. Pain management   ASPIRIN 325 MG EC TABLET    Take 325 mg by mouth daily.   ATORVASTATIN (LIPITOR) 10 MG TABLET    Take 10 mg by mouth daily at 6 PM.   BISACODYL (DULCOLAX) 5 MG EC TABLET    Take 10 mg by mouth daily as needed for moderate constipation.   CHOLECALCIFEROL (VITAMIN D) 1000 UNITS TABLET    Take 2,000 Units by mouth daily.   DOCUSATE SODIUM (COLACE) 100 MG CAPSULE    Take 100 mg by mouth daily as needed for mild constipation.    INSULIN GLARGINE (LANTUS) 100  UNIT/ML INJECTION    Inject 20-25 Units into the skin 2 (two) times daily. 20 units at bedtime and 25 units in the AM   LISINOPRIL (PRINIVIL,ZESTRIL) 5 MG TABLET    Take 2.5 mg by mouth one time a day   LORATADINE (CLARITIN) 10 MG TABLET    Take 10 mg by mouth daily as needed for allergies.    METOPROLOL TARTRATE (LOPRESSOR) 25 MG TABLET    Take 25 mg by mouth 2 (two) times daily. For HTN  Hold if B/P < 100/60 or HR < 60   NITROFURANTOIN, MACROCRYSTAL-MONOHYDRATE, (MACROBID) 100 MG CAPSULE    Take 1 capsule (100 mg total) by mouth at bedtime.   NYSTATIN (MYCOSTATIN/NYSTOP) POWDER    Apply under breast two times daily for rash   OXYCODONE-ACETAMINOPHEN (ROXICET) 5-325 MG TABLET    Take 1 tablet by mouth every 4 (four) hours as needed for severe pain. Not to exceed 3000mg  of APAP in 24 hours   PHENAZOPYRIDINE (PYRIDIUM) 200 MG TABLET    Take 200 mg by mouth every 8 (eight) hours as needed for pain.   POLYVINYL ALCOHOL-POVIDONE (FRESHKOTE) 2.7-2 % SOLN    Apply 1 drop to eye 3 (three) times daily. To both eyes   SENNA-DOCUSATE (SENOKOT S) 8.6-50 MG PER TABLET    Take 2 tablets by mouth at bedtime.    SKIN PROTECTANTS, MISC. (CALAZIME SKIN PROTECTANT EX)    Apply 1 application topically 2 (two) times daily.  Modified Medications   No medications on file  Discontinued Medications   No medications on file     SIGNIFICANT DIAGNOSTIC EXAMS   05-13-15: chest x-ray: no active disease is seen in chest  05-16-15: chest x-ray; Stable mild chronic bronchitic changes.  No acute abnormality.   05-18-15: chest x-ray: No acute abnormality.  Mild chronic interstitial lung disease.   05-18-15: ct of abdomen and pelvis: 1. Motion and hardware degraded images. 2. Moderate left-sided hydroureteronephrosis secondary to a distal left ureteric 11 x 13 mm calculus. Decreased left-sided renal function. 3. Large burden bilateral renal collecting system stones, including stones within both extrarenal pelves. 4.  Cirrhosis with small volume ascites. 5. Proctitis, likely related to C. difficile colitis, given the clinical history. 6. Bladder wall irregularity which could relate to cystitis. Bladder diverticulum, suggesting a component of outlet obstruction. 7. Skin irregularity superficial to the coccyx. Recommend physical exam correlation to exclude decubitus ulcer. No gross osseous destruction.   LABS REVIEWED:   02-05-15: chol 156; ldl 71; trig 207; hdl 44; tsh 5.01 03-06-15: wbc 8.9;  hgb 13.2; hct 41.1; mcv 93.8; plt 274; glucose 142; bun 15.2; creat 0.72; k+ 4.4;  na++132; liver normal albumin 3.6; hgb a1c 7.3  05-14-15; wbc 15.6; hgb 11.1 ;hct 33.3; mcv 92.6; plt 243; glucose 84; bun 36; creat 1.45; k+ 4.0; na++134  Liver normal albumin 2.9  05-16-15: wbc 18.0; hgb 10.4; hct 31.4; mcv 93.2 ;plt 293; glucose 108; bun 26; creat 1.28; k+ 3.7; na++135; liver normal albumin 2.6; urine culture: no growth; blood culture: no growth 05-17-15: glucose 83; bun 23; creat 1.15; k+ 3.8; na++140  hgb a1c 7.0 05-19-15: wbc 20.2; hgb 9.0; hgb 27.3; mcv 95.5; plt 309; glucose 58; bun 13; creat 1.01; k+ 4.3; na++13 05-21-15: wbc 13.5; hgb 9.3; hct 28.3; mcv 95.3; plt 433 05-26-15: glucose 123; bun 13; creat 0.7; k+ 5.0; na++ 137  06-18-15: wbc 8.7; hgb 11.2; hct 36.7; mcv 96.0; ;plt 278  11-05-15: wbc 8.3; hgb 12.5; hct 36.8; mcv 91.1; plt 292; glucose 113; bun 15.9; creat 0.86; k+ 4.9; na++ 140; liver normal albumin 4.1      Review of Systems Constitutional: Negative for appetite change and fatigue.  Respiratory: Negative for cough, chest tightness and shortness of breath.   Cardiovascular: Negative for chest pain, palpitations and leg swelling.  Gastrointestinal: Negative for nausea, abdominal pain, diarrhea and constipation.  Musculoskeletal: Negative for myalgias and arthralgias.  Skin: Negative for pallor.  Psychiatric/Behavioral: The patient is not nervous/anxious.       Physical Exam Constitutional: No  distress.  Eyes: Conjunctivae are normal.  Neck: Neck supple. No JVD present. No thyromegaly present.  Cardiovascular: Normal rate, regular rhythm and intact distal pulses.   Respiratory: Effort normal and breath sounds normal. No respiratory distress. She has no wheezes.  GI: Soft. Bowel sounds are normal. She exhibits no distension. There is no tenderness.  Musculoskeletal: She exhibits no edema.  Able to move all extremities   Lymphadenopathy:    She has no cervical adenopathy.  Neurological: She is alert.  Skin: Skin is warm and dry. She is not diaphoretic.  Psychiatric: She has a normal mood and affect.     ASSESSMENT/ PLAN:  1. Hypertension: will continue lisinopril 5 mg daily; and lopressor 25 mg twice daily  Asa 325 mg daily    2. Diabetes: will continue lantus 25 units in the AM and  20 units in the PM; hgb a1c is 7.0  3. Depression: will continue colace daily prn;  miralax daily and senna s 2 tabs daily   4. Dyslipidemia: will continue lipitor 10 mg daily ldl is 71; will monitor  5. Osteoarthritis: pain is presently being managed; will continue percocet 5/325 mg every 4 hours as needed   6. Depression with psychosis: is presently stable is presently not taking medications; will monitor   7. Alzheimer's disease: no change in status; is presently not taking medications will not make changes will monitor her status.  Her current weight is 266 pounds.   8. Nephrolithiasis; outlet obstruction: is on long term macrobid daily  is followed by urology.    Will check hgb a1c lipids; urine for micro-albumin    MD is aware of resident's narcotic use and is in agreement with current plan of care. We will attempt to wean resident as apropriate   Ok Edwards NP [facility]  Contact [phone] Monday through Friday 8am- 5pm  After hours call [phone]

## 2016-03-19 ENCOUNTER — Non-Acute Institutional Stay (SKILLED_NURSING_FACILITY): Payer: Medicare Other | Admitting: Adult Health

## 2016-03-19 DIAGNOSIS — N390 Urinary tract infection, site not specified: Secondary | ICD-10-CM

## 2016-03-19 DIAGNOSIS — G308 Other Alzheimer's disease: Secondary | ICD-10-CM

## 2016-03-19 DIAGNOSIS — E785 Hyperlipidemia, unspecified: Secondary | ICD-10-CM | POA: Diagnosis not present

## 2016-03-19 DIAGNOSIS — F0280 Dementia in other diseases classified elsewhere without behavioral disturbance: Secondary | ICD-10-CM

## 2016-03-19 DIAGNOSIS — E119 Type 2 diabetes mellitus without complications: Secondary | ICD-10-CM

## 2016-03-19 DIAGNOSIS — E1169 Type 2 diabetes mellitus with other specified complication: Secondary | ICD-10-CM

## 2016-03-19 DIAGNOSIS — I10 Essential (primary) hypertension: Secondary | ICD-10-CM | POA: Diagnosis not present

## 2016-03-19 DIAGNOSIS — E034 Atrophy of thyroid (acquired): Secondary | ICD-10-CM | POA: Diagnosis not present

## 2016-03-19 DIAGNOSIS — Z794 Long term (current) use of insulin: Secondary | ICD-10-CM | POA: Diagnosis not present

## 2016-03-23 DIAGNOSIS — E1142 Type 2 diabetes mellitus with diabetic polyneuropathy: Secondary | ICD-10-CM | POA: Diagnosis not present

## 2016-03-23 DIAGNOSIS — B351 Tinea unguium: Secondary | ICD-10-CM | POA: Diagnosis not present

## 2016-03-23 DIAGNOSIS — G63 Polyneuropathy in diseases classified elsewhere: Secondary | ICD-10-CM | POA: Diagnosis not present

## 2016-03-31 ENCOUNTER — Encounter: Payer: Self-pay | Admitting: Adult Health

## 2016-03-31 ENCOUNTER — Non-Acute Institutional Stay (SKILLED_NURSING_FACILITY): Payer: Medicare Other | Admitting: Adult Health

## 2016-03-31 DIAGNOSIS — R509 Fever, unspecified: Secondary | ICD-10-CM | POA: Diagnosis not present

## 2016-03-31 DIAGNOSIS — R69 Illness, unspecified: Secondary | ICD-10-CM | POA: Diagnosis not present

## 2016-03-31 DIAGNOSIS — K5900 Constipation, unspecified: Secondary | ICD-10-CM | POA: Diagnosis not present

## 2016-03-31 DIAGNOSIS — E784 Other hyperlipidemia: Secondary | ICD-10-CM | POA: Diagnosis not present

## 2016-03-31 DIAGNOSIS — J111 Influenza due to unidentified influenza virus with other respiratory manifestations: Secondary | ICD-10-CM

## 2016-03-31 DIAGNOSIS — R05 Cough: Secondary | ICD-10-CM | POA: Diagnosis not present

## 2016-03-31 DIAGNOSIS — I10 Essential (primary) hypertension: Secondary | ICD-10-CM | POA: Diagnosis not present

## 2016-03-31 DIAGNOSIS — E1165 Type 2 diabetes mellitus with hyperglycemia: Secondary | ICD-10-CM | POA: Diagnosis not present

## 2016-03-31 LAB — CBC AND DIFFERENTIAL
HEMATOCRIT: 42 % (ref 36–46)
HEMATOCRIT: 42 (ref 36–46)
Hemoglobin: 13.9 (ref 12.0–16.0)
Hemoglobin: 13.9 g/dL (ref 12.0–16.0)
NEUTROS ABS: 16 /uL
NEUTROS ABS: 16
PLATELETS: 310 10*3/uL (ref 150–399)
PLATELETS: 310 (ref 150–399)
WBC: 18.7
WBC: 18.7 10^3/mL

## 2016-03-31 LAB — BASIC METABOLIC PANEL
BUN: 17 mg/dL (ref 4–21)
Creatinine: 0.7 mg/dL (ref 0.5–1.1)
GLUCOSE: 160 mg/dL
Potassium: 4.1 mmol/L (ref 3.4–5.3)
Sodium: 138 mmol/L (ref 137–147)

## 2016-03-31 LAB — HEPATIC FUNCTION PANEL
ALK PHOS: 66 U/L (ref 25–125)
ALT: 18 U/L (ref 7–35)
AST: 16 U/L (ref 13–35)
BILIRUBIN, TOTAL: 0.4 mg/dL

## 2016-03-31 NOTE — Progress Notes (Signed)
Location:   [facility] Room Number: 115 B Place of Service:  SNF (31)   CODE STATUS: Full Code  No Known Allergies  Chief Complaint  Patient presents with  . Acute Visit    HPI:  She has been running fevers over the past 24 hours; she is not experiencing a cough; but does have nausea and vomiting. She did tell me that she feels bad. Will need to treat this a influenza.   Past Medical History:  Diagnosis Date  . Acute pyelonephritis   . AKI (acute kidney injury) ([place])   . Alzheimer's dementia   . Alzheimer's disease   . Bladder outlet obstruction   . Depression   . Diarrhea   . Dyslipidemia   . Environmental and seasonal allergies   . Essential hypertension, benign   . Foley catheter in place   . GERD (gastroesophageal reflux disease)   . History of urinary tract infection   . Hypertension   . Hypothyroidism   . Insomnia   . Nephrolithiasis   . Osteoarthritis   . Reflux esophagitis   . Rickets, active   . Sepsis ([place])   . Type II or unspecified type diabetes mellitus without mention of complication, uncontrolled   . Unspecified constipation   . Unspecified psychosis   . Unspecified vitamin D deficiency     Past Surgical History:  Procedure Laterality Date  . ABDOMINAL HYSTERECTOMY    . CYSTOSCOPY W/ URETERAL STENT PLACEMENT Bilateral 05/18/2015   Procedure: CYSTOSCOPY WITH RETROGRADE PYELOGRAM/URETERAL STENT PLACEMENT;  Surgeon: Carolan Clines, MD;  Location: [facility];  Service: Urology;  Laterality: Bilateral;  . CYSTOSCOPY W/ URETERAL STENT REMOVAL Bilateral 09/12/2015   Procedure: CYSTOSCOPY WITH STENT REMOVAL;  Surgeon: Carolan Clines, MD;  Location: [facility];  Service: Urology;  Laterality: Bilateral;  1 HOUR  . CYSTOSCOPY WITH RETROGRADE PYELOGRAM, URETEROSCOPY AND STENT PLACEMENT Bilateral 07/18/2015   Procedure: CYSTOSCOPY REMOVE LEFT DOUBLE J STENT LEFT RETROGRADE PYELOGRAM, LEFT URETEROSCOPY, PYELOSCOPY, REPLACEMENT OF LEFT DOUBLE J  STENT;  Surgeon: Carolan Clines, MD;  Location: [facility];  Service: Urology;  Laterality: Bilateral;  . HOLMIUM LASER APPLICATION Bilateral [id number]   Procedure: HOLMIUM LASER OF LEFT LOWER URETERAL STONE, LEFT PYELOSCOPY, REPLACE LEFT DOUBLE J STENT REMOVE RIGHT DOUBLE J STENT RIGHT RETROGRADE PYELOGRAM, REPLACEMENT RIGHT DOUBLE J STENT ;  Surgeon: Carolan Clines, MD;  Location: [facility];  Service: Urology;  Laterality: Bilateral;  . KNEE ARTHROSCOPY Left 1997    Social History   Social History  . Marital status: Married    Spouse name: N/A  . Number of children: N/A  . Years of education: N/A   Occupational History  . Not on file.   Social History Main Topics  . Smoking status: Never Smoker  . Smokeless tobacco: Never Used  . Alcohol use No  . Drug use: No  . Sexual activity: Not on file   Other Topics Concern  . Not on file   Social History Narrative  . No narrative on file   Family History  Problem Relation Age of Onset  . Family history unknown: Yes      VITAL SIGNS BP 132/86   Pulse 87   Temp 97.9 F (36.6 C)   Resp 18   Ht 4\' 11"  (1.499 m)   Wt 266 lb (120.7 kg)   SpO2 97%   BMI 53.73 kg/m   Patient's Medications  New Prescriptions   No medications on file  Previous Medications   ACETAMINOPHEN (  TYLENOL) 325 MG TABLET    Take 650 mg by mouth every 6 (six) hours as needed for moderate pain.   ACETAMINOPHEN (TYLENOL) 500 MG TABLET    Take 1,000 mg by mouth daily. Pain management   ASPIRIN 325 MG EC TABLET    Take 325 mg by mouth daily.   ATORVASTATIN (LIPITOR) 10 MG TABLET    Take 10 mg by mouth daily at 6 PM.   BISACODYL (DULCOLAX) 5 MG EC TABLET    Take 10 mg by mouth daily as needed for moderate constipation.   CHOLECALCIFEROL (VITAMIN D) 1000 UNITS TABLET    Take 2,000 Units by mouth daily.   DOCUSATE SODIUM (COLACE) 100 MG CAPSULE    Take 100 mg by mouth daily as needed for mild constipation.    INSULIN GLARGINE (LANTUS) 100 UNIT/ML INJECTION     Inject 20-25 Units into the skin 2 (two) times daily. 20 units at bedtime and 25 units in the AM   LISINOPRIL (PRINIVIL,ZESTRIL) 5 MG TABLET    Take 5 mg by mouth daily.    LORATADINE (CLARITIN) 10 MG TABLET    Take 10 mg by mouth daily as needed for allergies.    METOPROLOL TARTRATE (LOPRESSOR) 25 MG TABLET    Take 25 mg by mouth 2 (two) times daily. For HTN  Hold if B/P < 100/60 or HR < 60   NITROFURANTOIN, MACROCRYSTAL-MONOHYDRATE, (MACROBID) 100 MG CAPSULE    Take 1 capsule (100 mg total) by mouth at bedtime.   NYSTATIN (MYCOSTATIN/NYSTOP) POWDER    Apply under breast two times daily for rash   OSELTAMIVIR (TAMIFLU) 75 MG CAPSULE    Take 75 mg by mouth 2 (two) times daily.   OXYCODONE-ACETAMINOPHEN (ROXICET) 5-325 MG TABLET    Take 1 tablet by mouth every 4 (four) hours as needed for severe pain. Not to exceed 3000mg  of APAP in 24 hours   PHENAZOPYRIDINE (PYRIDIUM) 200 MG TABLET    Take 200 mg by mouth every 8 (eight) hours as needed for pain.   POLYVINYL ALCOHOL-POVIDONE (FRESHKOTE) 2.7-2 % SOLN    Apply 1 drop to eye 3 (three) times daily. To both eyes   PROMETHAZINE (PHENERGAN) 25 MG TABLET    Take 25 mg by mouth every 6 (six) hours as needed for nausea or vomiting.   SENNA-DOCUSATE (SENOKOT S) 8.6-50 MG PER TABLET    Take 2 tablets by mouth at bedtime.    SKIN PROTECTANTS, MISC. (CALAZIME SKIN PROTECTANT EX)    Apply 1 application topically 2 (two) times daily.  Modified Medications   No medications on file  Discontinued Medications   No medications on file     SIGNIFICANT DIAGNOSTIC EXAMS  05-13-15: chest x-ray: no active disease is seen in chest  05-16-15: chest x-ray; Stable mild chronic bronchitic changes.  No acute abnormality.   05-18-15: chest x-ray: No acute abnormality.  Mild chronic interstitial lung disease.   05-18-15: ct of abdomen and pelvis: 1. Motion and hardware degraded images. 2. Moderate left-sided hydroureteronephrosis secondary to a distal left ureteric 11 x  13 mm calculus. Decreased left-sided renal function. 3. Large burden bilateral renal collecting system stones, including stones within both extrarenal pelves. 4. Cirrhosis with small volume ascites. 5. Proctitis, likely related to C. difficile colitis, given the clinical history. 6. Bladder wall irregularity which could relate to cystitis. Bladder diverticulum, suggesting a component of outlet obstruction. 7. Skin irregularity superficial to the coccyx. Recommend physical exam correlation to exclude decubitus ulcer. No  gross osseous destruction.   LABS REVIEWED:   03-06-15: wbc 8.9;  hgb 13.2; hct 41.1; mcv 93.8; plt 274; glucose 142; bun 15.2; creat 0.72; k+ 4.4; na++132; liver normal albumin 3.6; hgb a1c 7.3  05-14-15; wbc 15.6; hgb 11.1 ;hct 33.3; mcv 92.6; plt 243; glucose 84; bun 36; creat 1.45; k+ 4.0; na++134  Liver normal albumin 2.9  05-16-15: wbc 18.0; hgb 10.4; hct 31.4; mcv 93.2 ;plt 293; glucose 108; bun 26; creat 1.28; k+ 3.7; na++135; liver normal albumin 2.6; urine culture: no growth; blood culture: no growth 05-17-15: glucose 83; bun 23; creat 1.15; k+ 3.8; na++140  hgb a1c 7.0 05-19-15: wbc 20.2; hgb 9.0; hgb 27.3; mcv 95.5; plt 309; glucose 58; bun 13; creat 1.01; k+ 4.3; na++13 05-21-15: wbc 13.5; hgb 9.3; hct 28.3; mcv 95.3; plt 433 05-26-15: glucose 123; bun 13; creat 0.7; k+ 5.0; na++ 137  06-18-15: wbc 8.7; hgb 11.2; hct 36.7; mcv 96.0; ;plt 278  11-05-15: wbc 8.3; hgb 12.5; hct 36.8; mcv 91.1; plt 292; glucose 113; bun 15.9; creat 0.86; k+ 4.9; na++ 140; liver normal albumin 4.1      Review of Systems Constitutional: Negative for appetite change and fatigue.  Respiratory: Negative for cough, chest tightness and shortness of breath.   Cardiovascular: Negative for chest pain, palpitations and leg swelling.  Gastrointestinal: has nausea and vomiting Musculoskeletal: Negative for myalgias and arthralgias.  Skin: Negative for pallor.  Psychiatric/Behavioral: The patient is not  nervous/anxious.       Physical Exam Constitutional: No distress.  Eyes: Conjunctivae are normal.  Neck: Neck supple. No JVD present. No thyromegaly present.  Cardiovascular: Normal rate, regular rhythm and intact distal pulses.   Respiratory: Effort normal and breath sounds normal. No respiratory distress. She has no wheezes.  GI: Soft. Bowel sounds are normal. She exhibits no distension. There is no tenderness.  Musculoskeletal: She exhibits no edema.  Able to move all extremities   Lymphadenopathy:    She has no cervical adenopathy.  Neurological: She is alert.  Skin: Skin is warm and dry. She is not diaphoretic.  Psychiatric: She has a normal mood and affect.     ASSESSMENT/ PLAN:  1. Influenza: will being tamiflu twice daily for 10 days; will begin zofran odt 8 mg every 8 hours as needed will give one time dose of phenergan 12.5 mg will place her on a clear liquid diet for one day and will advance as she tolerates.    will check cbc; cmp chest x-ray and kub  MD is aware of resident's narcotic use and is in agreement with current plan of care. We will attempt to wean resident as apropriate      Ok Edwards NP [facility]  Contact [phone] Monday through Friday 8am- 5pm  After hours call [phone]

## 2016-04-02 DIAGNOSIS — Z79899 Other long term (current) drug therapy: Secondary | ICD-10-CM | POA: Diagnosis not present

## 2016-04-02 LAB — BASIC METABOLIC PANEL
BUN: 31 mg/dL — AB (ref 4–21)
CREATININE: 1 mg/dL (ref 0.5–1.1)
GLUCOSE: 110 mg/dL
POTASSIUM: 4 mmol/L (ref 3.4–5.3)
Sodium: 135 mmol/L — AB (ref 137–147)

## 2016-04-02 LAB — HEPATIC FUNCTION PANEL
ALT: 14 U/L (ref 7–35)
AST: 17 U/L (ref 13–35)
Alkaline Phosphatase: 51 U/L (ref 25–125)
Bilirubin, Total: 0.3 mg/dL

## 2016-04-14 ENCOUNTER — Encounter: Payer: Self-pay | Admitting: Adult Health

## 2016-04-14 ENCOUNTER — Non-Acute Institutional Stay (SKILLED_NURSING_FACILITY): Payer: Medicare Other | Admitting: Adult Health

## 2016-04-14 DIAGNOSIS — K2900 Acute gastritis without bleeding: Secondary | ICD-10-CM

## 2016-04-14 NOTE — Progress Notes (Signed)
Location:   [facility] Room Number: 115 B Place of Service:  SNF (31)   CODE STATUS: Full code  No Known Allergies  Chief Complaint  Patient presents with  . Acute Visit    Abdominal pain/distention with decreased bowel sounds    HPI:  She has completed her tamiflu treatment. She tells me that her stomach was bloated yesterday but is better today. She is better able to tolerate food and is able to tolerate her fluids. She feels as though this has come her flu treatment   Past Medical History:  Diagnosis Date  . Acute pyelonephritis   . AKI (acute kidney injury) ([place])   . Alzheimer's dementia   . Alzheimer's disease   . Bladder outlet obstruction   . Depression   . Diarrhea   . Dyslipidemia   . Environmental and seasonal allergies   . Essential hypertension, benign   . Foley catheter in place   . GERD (gastroesophageal reflux disease)   . History of urinary tract infection   . Hypertension   . Hypothyroidism   . Insomnia   . Nephrolithiasis   . Osteoarthritis   . Reflux esophagitis   . Rickets, active   . Sepsis ([place])   . Type II or unspecified type diabetes mellitus without mention of complication, uncontrolled   . Unspecified constipation   . Unspecified psychosis   . Unspecified vitamin D deficiency     Past Surgical History:  Procedure Laterality Date  . ABDOMINAL HYSTERECTOMY    . CYSTOSCOPY W/ URETERAL STENT PLACEMENT Bilateral 05/18/2015   Procedure: CYSTOSCOPY WITH RETROGRADE PYELOGRAM/URETERAL STENT PLACEMENT;  Surgeon: Carolan Clines, MD;  Location: [facility];  Service: Urology;  Laterality: Bilateral;  . CYSTOSCOPY W/ URETERAL STENT REMOVAL Bilateral 09/12/2015   Procedure: CYSTOSCOPY WITH STENT REMOVAL;  Surgeon: Carolan Clines, MD;  Location: [facility];  Service: Urology;  Laterality: Bilateral;  1 HOUR  . CYSTOSCOPY WITH RETROGRADE PYELOGRAM, URETEROSCOPY AND STENT PLACEMENT Bilateral 07/18/2015   Procedure: CYSTOSCOPY REMOVE LEFT  DOUBLE J STENT LEFT RETROGRADE PYELOGRAM, LEFT URETEROSCOPY, PYELOSCOPY, REPLACEMENT OF LEFT DOUBLE J STENT;  Surgeon: Carolan Clines, MD;  Location: [facility];  Service: Urology;  Laterality: Bilateral;  . HOLMIUM LASER APPLICATION Bilateral 1/85/6314   Procedure: HOLMIUM LASER OF LEFT LOWER URETERAL STONE, LEFT PYELOSCOPY, REPLACE LEFT DOUBLE J STENT REMOVE RIGHT DOUBLE J STENT RIGHT RETROGRADE PYELOGRAM, REPLACEMENT RIGHT DOUBLE J STENT ;  Surgeon: Carolan Clines, MD;  Location: [facility];  Service: Urology;  Laterality: Bilateral;  . KNEE ARTHROSCOPY Left 1997    Social History   Social History  . Marital status: Married    Spouse name: N/A  . Number of children: N/A  . Years of education: N/A   Occupational History  . Not on file.   Social History Main Topics  . Smoking status: Never Smoker  . Smokeless tobacco: Never Used  . Alcohol use No  . Drug use: No  . Sexual activity: Not on file   Other Topics Concern  . Not on file   Social History Narrative  . No narrative on file   Family History  Problem Relation Age of Onset  . Family history unknown: Yes      VITAL SIGNS BP (!) 164/78   Pulse 80   Temp 98.2 F (36.8 C)   Resp 18   Ht 4\' 11"  (1.499 m)   SpO2 96%   Patient's Medications  New Prescriptions   No medications on file  Previous  Medications   ACETAMINOPHEN (TYLENOL) 325 MG TABLET    Take 650 mg by mouth every 6 (six) hours as needed for moderate pain.   ACETAMINOPHEN (TYLENOL) 500 MG TABLET    Take 1,000 mg by mouth daily. Pain management   ASPIRIN 325 MG EC TABLET    Take 325 mg by mouth daily.   ATORVASTATIN (LIPITOR) 10 MG TABLET    Take 10 mg by mouth daily at 6 PM.   BISACODYL (DULCOLAX) 5 MG EC TABLET    Take 10 mg by mouth daily as needed for moderate constipation.   CHOLECALCIFEROL (VITAMIN D) 1000 UNITS TABLET    Take 2,000 Units by mouth daily.   DOCUSATE SODIUM (COLACE) 100 MG CAPSULE    Take 100 mg by mouth daily as needed for mild  constipation.    INSULIN GLARGINE (LANTUS) 100 UNIT/ML INJECTION    Inject 20-25 Units into the skin 2 (two) times daily. 20 units at bedtime and 25 units in the AM   LISINOPRIL (PRINIVIL,ZESTRIL) 5 MG TABLET    Take 5 mg by mouth daily.    LORATADINE (CLARITIN) 10 MG TABLET    Take 10 mg by mouth daily as needed for allergies.    METOPROLOL TARTRATE (LOPRESSOR) 25 MG TABLET    Take 25 mg by mouth 2 (two) times daily. For HTN  Hold if B/P < 100/60 or HR < 60   NITROFURANTOIN, MACROCRYSTAL-MONOHYDRATE, (MACROBID) 100 MG CAPSULE    Take 1 capsule (100 mg total) by mouth at bedtime.   NYSTATIN (MYCOSTATIN/NYSTOP) POWDER    Apply under breast two times daily for rash   ONDANSETRON (ZOFRAN) 8 MG TABLET    Take 8 mg by mouth every 8 (eight) hours as needed for nausea or vomiting.   OXYCODONE-ACETAMINOPHEN (ROXICET) 5-325 MG TABLET    Take 1 tablet by mouth every 4 (four) hours as needed for severe pain. Not to exceed 3000mg  of APAP in 24 hours   PHENAZOPYRIDINE (PYRIDIUM) 200 MG TABLET    Take 200 mg by mouth every 8 (eight) hours as needed for pain.   POLYVINYL ALCOHOL-POVIDONE (FRESHKOTE) 2.7-2 % SOLN    Apply 1 drop to eye 3 (three) times daily. To both eyes   PROMETHAZINE (PHENERGAN) 25 MG TABLET    Take 25 mg by mouth every 6 (six) hours as needed for nausea or vomiting.   SACCHAROMYCES BOULARDII (FLORASTOR PO)    Take 1 tablet by mouth 2 (two) times daily.   SENNA-DOCUSATE (SENOKOT S) 8.6-50 MG PER TABLET    Take 2 tablets by mouth at bedtime.    SKIN PROTECTANTS, MISC. (CALAZIME SKIN PROTECTANT EX)    Apply 1 application topically 2 (two) times daily.  Modified Medications   No medications on file  Discontinued Medications   OSELTAMIVIR (TAMIFLU) 75 MG CAPSULE    Take 75 mg by mouth 2 (two) times daily.     SIGNIFICANT DIAGNOSTIC EXAMS  05-13-15: chest x-ray: no active disease is seen in chest  05-16-15: chest x-ray; Stable mild chronic bronchitic changes.  No acute abnormality.   05-18-15:  chest x-ray: No acute abnormality.  Mild chronic interstitial lung disease.   05-18-15: ct of abdomen and pelvis: 1. Motion and hardware degraded images. 2. Moderate left-sided hydroureteronephrosis secondary to a distal left ureteric 11 x 13 mm calculus. Decreased left-sided renal function. 3. Large burden bilateral renal collecting system stones, including stones within both extrarenal pelves. 4. Cirrhosis with small volume ascites. 5. Proctitis, likely related  to C. difficile colitis, given the clinical history. 6. Bladder wall irregularity which could relate to cystitis. Bladder diverticulum, suggesting a component of outlet obstruction. 7. Skin irregularity superficial to the coccyx. Recommend physical exam correlation to exclude decubitus ulcer. No gross osseous destruction.   LABS REVIEWED:   05-14-15; wbc 15.6; hgb 11.1 ;hct 33.3; mcv 92.6; plt 243; glucose 84; bun 36; creat 1.45; k+ 4.0; na++134  Liver normal albumin 2.9  05-16-15: wbc 18.0; hgb 10.4; hct 31.4; mcv 93.2 ;plt 293; glucose 108; bun 26; creat 1.28; k+ 3.7; na++135; liver normal albumin 2.6; urine culture: no growth; blood culture: no growth 05-17-15: glucose 83; bun 23; creat 1.15; k+ 3.8; na++140  hgb a1c 7.0 05-19-15: wbc 20.2; hgb 9.0; hgb 27.3; mcv 95.5; plt 309; glucose 58; bun 13; creat 1.01; k+ 4.3; na++13 05-21-15: wbc 13.5; hgb 9.3; hct 28.3; mcv 95.3; plt 433 05-26-15: glucose 123; bun 13; creat 0.7; k+ 5.0; na++ 137  06-18-15: wbc 8.7; hgb 11.2; hct 36.7; mcv 96.0; ;plt 278  11-05-15: wbc 8.3; hgb 12.5; hct 36.8; mcv 91.1; plt 292; glucose 113; bun 15.9; creat 0.86; k+ 4.9; na++ 140; liver normal albumin 4.1      Review of Systems Constitutional: Negative for appetite change and fatigue.  Respiratory: Negative for cough, chest tightness and shortness of breath.   Cardiovascular: Negative for chest pain, palpitations and leg swelling.  Gastrointestinal: Negative for nausea, abdominal pain, diarrhea and  constipation.  Musculoskeletal: Negative for myalgias and arthralgias.  Skin: Negative for pallor.  Psychiatric/Behavioral: The patient is not nervous/anxious.       Physical Exam Constitutional: No distress.  Eyes: Conjunctivae are normal.  Neck: Neck supple. No JVD present. No thyromegaly present.  Cardiovascular: Normal rate, regular rhythm and intact distal pulses.   Respiratory: Effort normal and breath sounds normal. No respiratory distress. She has no wheezes.  GI: Soft. Bowel sounds are normal. She exhibits no distension. There is no tenderness. declined digital exam Musculoskeletal: She exhibits no edema.  Able to move all extremities   Lymphadenopathy:    She has no cervical adenopathy.  Neurological: She is alert.  Skin: Skin is warm and dry. She is not diaphoretic.  Psychiatric: She has a normal mood and affect.     ASSESSMENT/ PLAN:  1. Mild gastritis: will begin her prilosec 20 mg twice daily for 2 weeks; will monitor   MD is aware of resident's narcotic use and is in agreement with current plan of care. We will attempt to wean resident as apropriate     Ok Edwards NP [facility]  Contact [phone] Monday through Friday 8am- 5pm  After hours call [phone]

## 2016-04-16 ENCOUNTER — Encounter: Payer: Self-pay | Admitting: Adult Health

## 2016-04-16 ENCOUNTER — Non-Acute Institutional Stay (SKILLED_NURSING_FACILITY): Payer: Medicare Other | Admitting: Adult Health

## 2016-04-16 DIAGNOSIS — E119 Type 2 diabetes mellitus without complications: Secondary | ICD-10-CM | POA: Diagnosis not present

## 2016-04-16 DIAGNOSIS — N320 Bladder-neck obstruction: Secondary | ICD-10-CM

## 2016-04-16 DIAGNOSIS — E034 Atrophy of thyroid (acquired): Secondary | ICD-10-CM

## 2016-04-16 DIAGNOSIS — Z794 Long term (current) use of insulin: Secondary | ICD-10-CM

## 2016-04-16 DIAGNOSIS — E785 Hyperlipidemia, unspecified: Secondary | ICD-10-CM

## 2016-04-16 DIAGNOSIS — F29 Unspecified psychosis not due to a substance or known physiological condition: Secondary | ICD-10-CM

## 2016-04-16 DIAGNOSIS — F0280 Dementia in other diseases classified elsewhere without behavioral disturbance: Secondary | ICD-10-CM | POA: Diagnosis not present

## 2016-04-16 DIAGNOSIS — E1169 Type 2 diabetes mellitus with other specified complication: Secondary | ICD-10-CM

## 2016-04-16 DIAGNOSIS — I10 Essential (primary) hypertension: Secondary | ICD-10-CM | POA: Diagnosis not present

## 2016-04-16 DIAGNOSIS — G308 Other Alzheimer's disease: Secondary | ICD-10-CM

## 2016-04-16 NOTE — Progress Notes (Signed)
Location:   [facility] Room Number: 115 B Place of Service:  SNF (31)   CODE STATUS: Full Code  No Known Allergies  Chief Complaint  Patient presents with  . Medical Management of Chronic Issues    1 month follow up    HPI:  She is a long term resident of this facility being seen for the management of her chronic illnesses. She spends nearly all of her time in bed per her choice. She is not voicing any complaints at this time. There are no nursing concerns at this time.    Past Medical History:  Diagnosis Date  . Acute pyelonephritis   . AKI (acute kidney injury) ([place])   . Alzheimer's dementia   . Alzheimer's disease   . Bladder outlet obstruction   . Depression   . Diarrhea   . Dyslipidemia   . Environmental and seasonal allergies   . Essential hypertension, benign   . Foley catheter in place   . GERD (gastroesophageal reflux disease)   . History of urinary tract infection   . Hypertension   . Hypothyroidism   . Insomnia   . Nephrolithiasis   . Osteoarthritis   . Reflux esophagitis   . Rickets, active   . Sepsis ([place])   . Type II or unspecified type diabetes mellitus without mention of complication, uncontrolled   . Unspecified constipation   . Unspecified psychosis   . Unspecified vitamin D deficiency     Past Surgical History:  Procedure Laterality Date  . ABDOMINAL HYSTERECTOMY    . CYSTOSCOPY W/ URETERAL STENT PLACEMENT Bilateral 05/18/2015   Procedure: CYSTOSCOPY WITH RETROGRADE PYELOGRAM/URETERAL STENT PLACEMENT;  Surgeon: Carolan Clines, MD;  [facility];  Service: Urology;  Laterality: Bilateral;  . CYSTOSCOPY W/ URETERAL STENT REMOVAL Bilateral 09/12/2015   Procedure: CYSTOSCOPY WITH STENT REMOVAL;  Surgeon: Carolan Clines, MD;  [facility];  Service: Urology;  Laterality: Bilateral;  1 HOUR  . CYSTOSCOPY WITH RETROGRADE PYELOGRAM, URETEROSCOPY AND STENT PLACEMENT Bilateral 07/18/2015   Procedure: CYSTOSCOPY REMOVE LEFT  DOUBLE J STENT LEFT RETROGRADE PYELOGRAM, LEFT URETEROSCOPY, PYELOSCOPY, REPLACEMENT OF LEFT DOUBLE J STENT;  Surgeon: Carolan Clines, MD;  [facility];  Service: Urology;  Laterality: Bilateral;  . HOLMIUM LASER APPLICATION Bilateral 0/93/8182   Procedure: HOLMIUM LASER OF LEFT LOWER URETERAL STONE, LEFT PYELOSCOPY, REPLACE LEFT DOUBLE J STENT REMOVE RIGHT DOUBLE J STENT RIGHT RETROGRADE PYELOGRAM, REPLACEMENT RIGHT DOUBLE J STENT ;  Surgeon: Carolan Clines, MD;  [facility];  Service: Urology;  Laterality: Bilateral;  . KNEE ARTHROSCOPY Left 1997    Social History   Social History  . Marital status: Married    Spouse name: N/A  . Number of children: N/A  . Years of education: N/A   Occupational History  . Not on file.   Social History Main Topics  . Smoking status: Never Smoker  . Smokeless tobacco: Never Used  . Alcohol use No  . Drug use: No  . Sexual activity: Not on file   Other Topics Concern  . Not on file   Social History Narrative  . No narrative on file   Family History  Problem Relation Age of Onset  . Family history unknown: Yes      VITAL SIGNS BP (!) 158/72   Pulse 75   Temp 98.2 F (36.8 C)   Resp 18   Ht 4\' 11"  (1.499 m)   Wt 164 lb (74.4 kg)   SpO2 96%  BMI 33.12 kg/m   Patient's Medications  New Prescriptions   No medications on file  Previous Medications   ACETAMINOPHEN (TYLENOL) 325 MG TABLET    Take 650 mg by mouth every 6 (six) hours as needed for moderate pain.   ACETAMINOPHEN (TYLENOL) 500 MG TABLET    Take 1,000 mg by mouth daily. Pain management   ASPIRIN 325 MG EC TABLET    Take 325 mg by mouth daily.   ATORVASTATIN (LIPITOR) 10 MG TABLET    Take 10 mg by mouth daily at 6 PM.   BISACODYL (DULCOLAX) 5 MG EC TABLET    Take 10 mg by mouth daily as needed for moderate constipation.   CHOLECALCIFEROL (VITAMIN D) 1000 UNITS TABLET    Take 2,000 Units by mouth daily.   DOCUSATE SODIUM (COLACE) 100 MG CAPSULE    Take  100 mg by mouth daily as needed for mild constipation.    INSULIN GLARGINE (LANTUS) 100 UNIT/ML INJECTION    Inject 20-25 Units into the skin 2 (two) times daily. 20 units at bedtime and 25 units in the AM   LISINOPRIL (PRINIVIL,ZESTRIL) 5 MG TABLET    Take 5 mg by mouth daily.    LORATADINE (CLARITIN) 10 MG TABLET    Take 10 mg by mouth daily as needed for allergies.    METOPROLOL TARTRATE (LOPRESSOR) 25 MG TABLET    Take 25 mg by mouth 2 (two) times daily. For HTN  Hold if B/P < 100/60 or HR < 60   MOXIFLOXACIN (AVELOX) 400 MG TABLET    Take 400 mg by mouth daily at 8 pm.   NITROFURANTOIN, MACROCRYSTAL-MONOHYDRATE, (MACROBID) 100 MG CAPSULE    Take 1 capsule (100 mg total) by mouth at bedtime.   NYSTATIN (MYCOSTATIN/NYSTOP) POWDER    Apply under breast two times daily for rash   OMEPRAZOLE (PRILOSEC) 20 MG CAPSULE    Take 20 mg by mouth daily.   ONDANSETRON (ZOFRAN) 8 MG TABLET    Take 8 mg by mouth every 8 (eight) hours as needed for nausea or vomiting.   OXYCODONE-ACETAMINOPHEN (ROXICET) 5-325 MG TABLET    Take 1 tablet by mouth every 4 (four) hours as needed for severe pain. Not to exceed 3000mg  of APAP in 24 hours   PHENAZOPYRIDINE (PYRIDIUM) 200 MG TABLET    Take 200 mg by mouth every 8 (eight) hours as needed for pain.   POLYVINYL ALCOHOL-POVIDONE (FRESHKOTE) 2.7-2 % SOLN    Apply 1 drop to eye 3 (three) times daily. To both eyes   PROMETHAZINE (PHENERGAN) 25 MG TABLET    Take 25 mg by mouth every 6 (six) hours as needed for nausea or vomiting.   SACCHAROMYCES BOULARDII (FLORASTOR PO)    Take 1 tablet by mouth 2 (two) times daily.   SENNA-DOCUSATE (SENOKOT S) 8.6-50 MG PER TABLET    Take 2 tablets by mouth at bedtime.    SKIN PROTECTANTS, MISC. (CALAZIME SKIN PROTECTANT EX)    Apply 1 application topically 2 (two) times daily.  Modified Medications   No medications on file  Discontinued Medications   No medications on file     SIGNIFICANT DIAGNOSTIC EXAMS  05-13-15: chest x-ray: no  active disease is seen in chest  05-16-15: chest x-ray; Stable mild chronic bronchitic changes.  No acute abnormality.   05-18-15: chest x-ray: No acute abnormality.  Mild chronic interstitial lung disease.   05-18-15: ct of abdomen and pelvis: 1. Motion and hardware degraded images. 2. Moderate left-sided  hydroureteronephrosis secondary to a distal left ureteric 11 x 13 mm calculus. Decreased left-sided renal function. 3. Large burden bilateral renal collecting system stones, including stones within both extrarenal pelves. 4. Cirrhosis with small volume ascites. 5. Proctitis, likely related to C. difficile colitis, given the clinical history. 6. Bladder wall irregularity which could relate to cystitis. Bladder diverticulum, suggesting a component of outlet obstruction. 7. Skin irregularity superficial to the coccyx. Recommend physical exam correlation to exclude decubitus ulcer. No gross osseous destruction.  03-31-16: chest x-ray: no acute cardiopulmonary disease  03-31-16: kub: mild constipation    LABS REVIEWED:   05-14-15; wbc 15.6; hgb 11.1 ;hct 33.3; mcv 92.6; plt 243; glucose 84; bun 36; creat 1.45; k+ 4.0; na++134  Liver normal albumin 2.9  05-16-15: wbc 18.0; hgb 10.4; hct 31.4; mcv 93.2 ;plt 293; glucose 108; bun 26; creat 1.28; k+ 3.7; na++135; liver normal albumin 2.6; urine culture: no growth; blood culture: no growth 05-17-15: glucose 83; bun 23; creat 1.15; k+ 3.8; na++140  hgb a1c 7.0 05-19-15: wbc 20.2; hgb 9.0; hgb 27.3; mcv 95.5; plt 309; glucose 58; bun 13; creat 1.01; k+ 4.3; na++13 05-21-15: wbc 13.5; hgb 9.3; hct 28.3; mcv 95.3; plt 433 05-26-15: glucose 123; bun 13; creat 0.7; k+ 5.0; na++ 137  06-18-15: wbc 8.7; hgb 11.2; hct 36.7; mcv 96.0; ;plt 278  11-05-15: wbc 8.3; hgb 12.5; hct 36.8; mcv 91.1; plt 292; glucose 113; bun 15.9; creat 0.86; k+ 4.9; na++ 140; liver normal albumin 4.1  03-31-16: wbc 18.7; hgb 13.9; hct 41.8; mcv 95.7; plt 310; glucose 160; bun 17.2; creat  0.74; k+ 4.1; na++ 138; liver normal albumin 4.2    Review of Systems Constitutional: Negative for appetite change and fatigue.  Respiratory: Negative for cough, chest tightness and shortness of breath.   Cardiovascular: Negative for chest pain, palpitations and leg swelling.  Gastrointestinal: Negative for nausea, abdominal pain, diarrhea and constipation.  Musculoskeletal: Negative for myalgias and arthralgias.  Skin: Negative for pallor.  Psychiatric/Behavioral: The patient is not nervous/anxious.       Physical Exam Constitutional: No distress.  Eyes: Conjunctivae are normal.  Neck: Neck supple. No JVD present. No thyromegaly present.  Cardiovascular: Normal rate, regular rhythm and intact distal pulses.   Respiratory: Effort normal and breath sounds normal. No respiratory distress. She has no wheezes.  GI: Soft. Bowel sounds are normal. She exhibits no distension. There is no tenderness.  Musculoskeletal: She exhibits no edema.  Able to move all extremities   Lymphadenopathy:    She has no cervical adenopathy.  Neurological: She is alert.  Skin: Skin is warm and dry. She is not diaphoretic.  Psychiatric: She has a normal mood and affect.     ASSESSMENT/ PLAN:  1. Hypertension: will continue lisinopril 5 mg daily; and lopressor 25 mg twice daily  [place] 325 mg daily    2. Diabetes: will continue lantus 25 units in the AM and  20 units in the PM; hgb a1c is 7.0  3. Depression: will continue colace daily prn;  miralax daily and senna s 2 tabs daily   4. Dyslipidemia: will continue lipitor 10 mg daily ldl is 71; will monitor  5. Osteoarthritis: pain is presently being managed; will continue percocet 5/325 mg every 4 hours as needed   6. Depression with psychosis: is presently stable is presently not taking medications; will monitor   7. Alzheimer's disease: no change in status; is presently not taking medications will not make changes will monitor her status.  Her  current weight is 266 pounds.   8. Nephrolithiasis; outlet obstruction: is on long term macrobid daily  is followed by urology.     MD is aware of resident's narcotic use and is in agreement with current plan of care. We will attempt to wean resident as apropriate     Ok Edwards NP [facility]  Contact [phone] Monday through Friday 8am- 5pm  After hours call [phone]

## 2016-04-21 ENCOUNTER — Other Ambulatory Visit: Payer: Self-pay | Admitting: *Deleted

## 2016-04-21 MED ORDER — OXYCODONE-ACETAMINOPHEN 5-325 MG PO TABS: 1.0000 | ORAL_TABLET | ORAL | 0 refills | Status: DC | PRN

## 2016-04-21 NOTE — Telephone Encounter (Signed)
[facility] #[phone] Fax:[phone]  

## 2016-04-22 ENCOUNTER — Other Ambulatory Visit: Payer: Self-pay

## 2016-04-22 NOTE — Telephone Encounter (Signed)
[facility] Phone: [phone] Fax: [phone]    Duplicate request, Rx for oxycodone was printed yesterday.

## 2016-04-22 NOTE — Progress Notes (Signed)
Location:    [facility]   Place of Service:  SNF (31)   CODE STATUS: full code   No Known Allergies  Chief Complaint  Patient presents with  . Medical Management of Chronic Issues    HPI:  She is a long term resident of this facility being seen for the management of her chronic illnesses. Overall her status is stable. She does spend all of her time in bed per her choice. Her blood pressure is elevated and will require her medications to be adjusted. There are no nursing concerns at this time.    Past Medical History:  Diagnosis Date  . Acute pyelonephritis   . AKI (acute kidney injury) ([place])   . Alzheimer's dementia   . Alzheimer's disease   . Bladder outlet obstruction   . Depression   . Diarrhea   . Dyslipidemia   . Environmental and seasonal allergies   . Essential hypertension, benign   . Foley catheter in place   . GERD (gastroesophageal reflux disease)   . History of urinary tract infection   . Hypertension   . Hypothyroidism   . Insomnia   . Nephrolithiasis   . Osteoarthritis   . Reflux esophagitis   . Rickets, active   . Sepsis ([place])   . Type II or unspecified type diabetes mellitus without mention of complication, uncontrolled   . Unspecified constipation   . Unspecified psychosis   . Unspecified vitamin D deficiency     Past Surgical History:  Procedure Laterality Date  . ABDOMINAL HYSTERECTOMY    . CYSTOSCOPY W/ URETERAL STENT PLACEMENT Bilateral 05/18/2015   Procedure: CYSTOSCOPY WITH RETROGRADE PYELOGRAM/URETERAL STENT PLACEMENT;  Surgeon: Carolan Clines, MD;  Location: [facility];  Service: Urology;  Laterality: Bilateral;  . CYSTOSCOPY W/ URETERAL STENT REMOVAL Bilateral 09/12/2015   Procedure: CYSTOSCOPY WITH STENT REMOVAL;  Surgeon: Carolan Clines, MD;  Location: [facility];  Service: Urology;  Laterality: Bilateral;  1 HOUR  . CYSTOSCOPY WITH RETROGRADE PYELOGRAM, URETEROSCOPY AND STENT PLACEMENT Bilateral 07/18/2015   Procedure: CYSTOSCOPY  REMOVE LEFT DOUBLE J STENT LEFT RETROGRADE PYELOGRAM, LEFT URETEROSCOPY, PYELOSCOPY, REPLACEMENT OF LEFT DOUBLE J STENT;  Surgeon: Carolan Clines, MD;  Location: [facility];  Service: Urology;  Laterality: Bilateral;  . HOLMIUM LASER APPLICATION Bilateral 9/75/8832   Procedure: HOLMIUM LASER OF LEFT LOWER URETERAL STONE, LEFT PYELOSCOPY, REPLACE LEFT DOUBLE J STENT REMOVE RIGHT DOUBLE J STENT RIGHT RETROGRADE PYELOGRAM, REPLACEMENT RIGHT DOUBLE J STENT ;  Surgeon: Carolan Clines, MD;  Location: [facility];  Service: Urology;  Laterality: Bilateral;  . KNEE ARTHROSCOPY Left 1997    Social History   Social History  . Marital status: Married    Spouse name: N/A  . Number of children: N/A  . Years of education: N/A   Occupational History  . Not on file.   Social History Main Topics  . Smoking status: Never Smoker  . Smokeless tobacco: Never Used  . Alcohol use No  . Drug use: No  . Sexual activity: Not on file   Other Topics Concern  . Not on file   Social History Narrative  . No narrative on file   Family History  Problem Relation Age of Onset  . Family history unknown: Yes      VITAL SIGNS BP (!) 168/78   Pulse 85   Temp 98.1 F (36.7 C)   Resp 18   Ht 4\' 11"  (1.499 m)   Wt 266 lb (120.7 kg)   SpO2 94%  BMI 53.73 kg/m   Patient's Medications  New Prescriptions   No medications on file  Previous Medications   ACETAMINOPHEN (TYLENOL) 325 MG TABLET    Take 650 mg by mouth every 6 (six) hours as needed for moderate pain.   ACETAMINOPHEN (TYLENOL) 500 MG TABLET    Take 1,000 mg by mouth daily. Pain management   ASPIRIN 325 MG EC TABLET    Take 325 mg by mouth daily.   ATORVASTATIN (LIPITOR) 10 MG TABLET    Take 10 mg by mouth daily at 6 PM.   BISACODYL (DULCOLAX) 5 MG EC TABLET    Take 10 mg by mouth daily as needed for moderate constipation.   CHOLECALCIFEROL (VITAMIN D) 1000 UNITS TABLET    Take 2,000 Units by mouth daily.   DOCUSATE SODIUM (COLACE) 100 MG  CAPSULE    Take 100 mg by mouth daily as needed for mild constipation.    INSULIN GLARGINE (LANTUS) 100 UNIT/ML INJECTION    Inject 20-25 Units into the skin 2 (two) times daily. 20 units at bedtime and 25 units in the AM   LISINOPRIL (PRINIVIL,ZESTRIL) 2.5 MG TABLET    Take 2.5 mg by mouth daily.    LORATADINE (CLARITIN) 10 MG TABLET    Take 10 mg by mouth daily as needed for allergies.    METOPROLOL TARTRATE (LOPRESSOR) 25 MG TABLET    Take 25 mg by mouth 2 (two) times daily. For HTN  Hold if B/P < 100/60 or HR < 60   NITROFURANTOIN, MACROCRYSTAL-MONOHYDRATE, (MACROBID) 100 MG CAPSULE    Take 1 capsule (100 mg total) by mouth at bedtime.   NYSTATIN (MYCOSTATIN/NYSTOP) POWDER    Apply under breast two times daily for rash   OMEPRAZOLE (PRILOSEC) 20 MG CAPSULE    Take 20 mg by mouth daily.   ONDANSETRON (ZOFRAN) 8 MG TABLET    Take 8 mg by mouth every 8 (eight) hours as needed for nausea or vomiting.   OXYCODONE-ACETAMINOPHEN (ROXICET) 5-325 MG TABLET    Take 1 tablet by mouth every 4 (four) hours as needed for severe pain. Not to exceed 3000mg  of APAP in 24 hours   PHENAZOPYRIDINE (PYRIDIUM) 200 MG TABLET    Take 200 mg by mouth every 8 (eight) hours as needed for pain.   POLYVINYL ALCOHOL-POVIDONE (FRESHKOTE) 2.7-2 % SOLN    Apply 1 drop to eye 3 (three) times daily. To both eyes   PROMETHAZINE (PHENERGAN) 25 MG TABLET    Take 25 mg by mouth every 6 (six) hours as needed for nausea or vomiting.   SACCHAROMYCES BOULARDII (FLORASTOR PO)    Take 1 tablet by mouth 2 (two) times daily.   SENNA-DOCUSATE (SENOKOT S) 8.6-50 MG PER TABLET    Take 2 tablets by mouth at bedtime.    SKIN PROTECTANTS, MISC. (CALAZIME SKIN PROTECTANT EX)    Apply 1 application topically 2 (two) times daily.  Modified Medications   No medications on file  Discontinued Medications   No medications on file     SIGNIFICANT DIAGNOSTIC EXAMS   05-13-15: chest x-ray: no active disease is seen in chest  05-16-15: chest x-ray;  Stable mild chronic bronchitic changes.  No acute abnormality.   05-18-15: chest x-ray: No acute abnormality.  Mild chronic interstitial lung disease.   05-18-15: ct of abdomen and pelvis: 1. Motion and hardware degraded images. 2. Moderate left-sided hydroureteronephrosis secondary to a distal left ureteric 11 x 13 mm calculus. Decreased left-sided renal function. 3. Large  burden bilateral renal collecting system stones, including stones within both extrarenal pelves. 4. Cirrhosis with small volume ascites. 5. Proctitis, likely related to C. difficile colitis, given the clinical history. 6. Bladder wall irregularity which could relate to cystitis. Bladder diverticulum, suggesting a component of outlet obstruction. 7. Skin irregularity superficial to the coccyx. Recommend physical exam correlation to exclude decubitus ulcer. No gross osseous destruction.   LABS REVIEWED:   03-06-15: wbc 8.9;  hgb 13.2; hct 41.1; mcv 93.8; plt 274; glucose 142; bun 15.2; creat 0.72; k+ 4.4; na++132; liver normal albumin 3.6; hgb a1c 7.3  05-14-15; wbc 15.6; hgb 11.1 ;hct 33.3; mcv 92.6; plt 243; glucose 84; bun 36; creat 1.45; k+ 4.0; na++134  Liver normal albumin 2.9  05-16-15: wbc 18.0; hgb 10.4; hct 31.4; mcv 93.2 ;plt 293; glucose 108; bun 26; creat 1.28; k+ 3.7; na++135; liver normal albumin 2.6; urine culture: no growth; blood culture: no growth 05-17-15: glucose 83; bun 23; creat 1.15; k+ 3.8; na++140  hgb a1c 7.0 05-19-15: wbc 20.2; hgb 9.0; hgb 27.3; mcv 95.5; plt 309; glucose 58; bun 13; creat 1.01; k+ 4.3; na++13 05-21-15: wbc 13.5; hgb 9.3; hct 28.3; mcv 95.3; plt 433 05-26-15: glucose 123; bun 13; creat 0.7; k+ 5.0; na++ 137  06-18-15: wbc 8.7; hgb 11.2; hct 36.7; mcv 96.0; ;plt 278  11-05-15: wbc 8.3; hgb 12.5; hct 36.8; mcv 91.1; plt 292; glucose 113; bun 15.9; creat 0.86; k+ 4.9; na++ 140; liver normal albumin 4.1      Review of Systems Constitutional: Negative for appetite change and fatigue.    Respiratory: Negative for cough, chest tightness and shortness of breath.   Cardiovascular: Negative for chest pain, palpitations and leg swelling.  Gastrointestinal: Negative for nausea, abdominal pain, diarrhea and constipation.  Musculoskeletal: Negative for myalgias and arthralgias.  Skin: Negative for pallor.  Psychiatric/Behavioral: The patient is not nervous/anxious.       Physical Exam Constitutional: No distress.  Eyes: Conjunctivae are normal.  Neck: Neck supple. No JVD present. No thyromegaly present.  Cardiovascular: Normal rate, regular rhythm and intact distal pulses.   Respiratory: Effort normal and breath sounds normal. No respiratory distress. She has no wheezes.  GI: Soft. Bowel sounds are normal. She exhibits no distension. There is no tenderness.  Musculoskeletal: She exhibits no edema.  Able to move all extremities   Lymphadenopathy:    She has no cervical adenopathy.  Neurological: She is alert.  Skin: Skin is warm and dry. She is not diaphoretic.  Psychiatric: She has a normal mood and affect.     ASSESSMENT/ PLAN:  1. Hypertension: will increase to  lisinopril 5 mg daily; and lopressor 25 mg twice daily  Asa 325 mg daily    2. Diabetes: will continue lantus 25 units in the AM and  20 units in the PM; hgb a1c is 7.0  3. Depression: will continue colace daily prn;  miralax daily and senna s 2 tabs daily   4. Dyslipidemia: will continue lipitor 10 mg daily ldl is 71; will monitor  5. Osteoarthritis: pain is presently being managed; will continue percocet 5/325 mg every 4 hours as needed   6. Depression with psychosis: is presently stable is presently not taking medications; will monitor   7. Alzheimer's disease: no change in status; is presently not taking medications will not make changes will monitor her status.  Her current weight is 266 pounds.   8. Nephrolithiasis; outlet obstruction: is on long term macrobid daily  is followed by urology.  Will get urine micro-albumin cmp in 2 weeks.     MD is aware of resident's narcotic use and is in agreement with current plan of care. We will attempt to wean resident as apropriate   Ok Edwards NP [facility]  Contact [phone] Monday through Friday 8am- 5pm  After hours call [phone]

## 2016-04-22 NOTE — Progress Notes (Signed)
This encounter was created in error - please disregard.

## 2016-04-23 ENCOUNTER — Encounter: Payer: Self-pay | Admitting: Adult Health

## 2016-04-23 NOTE — Progress Notes (Signed)
ENTERED IN ERROR

## 2016-04-27 ENCOUNTER — Encounter: Payer: Self-pay | Admitting: Internal Medicine

## 2016-05-14 ENCOUNTER — Encounter: Payer: Self-pay | Admitting: Adult Health

## 2016-05-14 ENCOUNTER — Non-Acute Institutional Stay (SKILLED_NURSING_FACILITY): Payer: Medicare Other | Admitting: Adult Health

## 2016-05-14 DIAGNOSIS — E1169 Type 2 diabetes mellitus with other specified complication: Secondary | ICD-10-CM | POA: Diagnosis not present

## 2016-05-14 DIAGNOSIS — F329 Major depressive disorder, single episode, unspecified: Secondary | ICD-10-CM

## 2016-05-14 DIAGNOSIS — I10 Essential (primary) hypertension: Secondary | ICD-10-CM

## 2016-05-14 DIAGNOSIS — E785 Hyperlipidemia, unspecified: Secondary | ICD-10-CM

## 2016-05-14 DIAGNOSIS — N390 Urinary tract infection, site not specified: Secondary | ICD-10-CM

## 2016-05-14 DIAGNOSIS — G308 Other Alzheimer's disease: Secondary | ICD-10-CM | POA: Diagnosis not present

## 2016-05-14 DIAGNOSIS — E1159 Type 2 diabetes mellitus with other circulatory complications: Secondary | ICD-10-CM | POA: Diagnosis not present

## 2016-05-14 DIAGNOSIS — M159 Polyosteoarthritis, unspecified: Secondary | ICD-10-CM

## 2016-05-14 DIAGNOSIS — F0280 Dementia in other diseases classified elsewhere without behavioral disturbance: Secondary | ICD-10-CM | POA: Diagnosis not present

## 2016-05-14 DIAGNOSIS — N320 Bladder-neck obstruction: Secondary | ICD-10-CM

## 2016-05-14 DIAGNOSIS — E039 Hypothyroidism, unspecified: Secondary | ICD-10-CM | POA: Diagnosis not present

## 2016-05-14 DIAGNOSIS — M150 Primary generalized (osteo)arthritis: Secondary | ICD-10-CM | POA: Diagnosis not present

## 2016-05-14 DIAGNOSIS — E1165 Type 2 diabetes mellitus with hyperglycemia: Secondary | ICD-10-CM | POA: Diagnosis not present

## 2016-05-14 DIAGNOSIS — E119 Type 2 diabetes mellitus without complications: Secondary | ICD-10-CM | POA: Diagnosis not present

## 2016-05-14 DIAGNOSIS — I152 Hypertension secondary to endocrine disorders: Secondary | ICD-10-CM | POA: Insufficient documentation

## 2016-05-14 DIAGNOSIS — F32A Depression, unspecified: Secondary | ICD-10-CM

## 2016-05-14 LAB — HEMOGLOBIN A1C: HEMOGLOBIN A1C: 5.9

## 2016-05-14 LAB — LIPID PANEL
Cholesterol: 136 (ref 0–200)
HDL: 39 (ref 35–70)
LDL CALC: 48
TRIGLYCERIDES: 248 — AB (ref 40–160)

## 2016-05-14 LAB — TSH: TSH: 2.18 (ref 0.41–5.90)

## 2016-05-14 NOTE — Progress Notes (Signed)
Location:   [facility] Room Number: 115 B Place of Service:  SNF (31)   CODE STATUS: Full Code  No Known Allergies  Chief Complaint  Patient presents with  . Medical Management of Chronic Issues    1 month follow up    HPI:  She is a long term resident of this facility being seen for management of her chronic illnesses. She spends nearly all of her time in the bed per her choice. She tells me that she is feeling good and has no complaints. There are no nursing concerns at this time.    Past Medical History:  Diagnosis Date  . Acute pyelonephritis   . AKI (acute kidney injury) ([place])   . Alzheimer's dementia   . Alzheimer's disease   . Bladder outlet obstruction   . Depression   . Diarrhea   . Dyslipidemia   . Environmental and seasonal allergies   . Essential hypertension, benign   . Foley catheter in place   . GERD (gastroesophageal reflux disease)   . History of urinary tract infection   . Hypertension   . Hypothyroidism   . Insomnia   . Nephrolithiasis   . Osteoarthritis   . Reflux esophagitis   . Rickets, active   . Sepsis ([place])   . Type II or unspecified type diabetes mellitus without mention of complication, uncontrolled   . Unspecified constipation   . Unspecified psychosis   . Unspecified vitamin D deficiency     Past Surgical History:  Procedure Laterality Date  . ABDOMINAL HYSTERECTOMY    . CYSTOSCOPY W/ URETERAL STENT PLACEMENT Bilateral 05/18/2015   Procedure: CYSTOSCOPY WITH RETROGRADE PYELOGRAM/URETERAL STENT PLACEMENT;  Surgeon: Carolan Clines, MD;  Location: [facility];  Service: Urology;  Laterality: Bilateral;  . CYSTOSCOPY W/ URETERAL STENT REMOVAL Bilateral 09/12/2015   Procedure: CYSTOSCOPY WITH STENT REMOVAL;  Surgeon: Carolan Clines, MD;  Location: [facility];  Service: Urology;  Laterality: Bilateral;  1 HOUR  . CYSTOSCOPY WITH RETROGRADE PYELOGRAM, URETEROSCOPY AND STENT PLACEMENT Bilateral 07/18/2015   Procedure: CYSTOSCOPY  REMOVE LEFT DOUBLE J STENT LEFT RETROGRADE PYELOGRAM, LEFT URETEROSCOPY, PYELOSCOPY, REPLACEMENT OF LEFT DOUBLE J STENT;  Surgeon: Carolan Clines, MD;  Location: [facility];  Service: Urology;  Laterality: Bilateral;  . HOLMIUM LASER APPLICATION Bilateral 1/96/2229   Procedure: HOLMIUM LASER OF LEFT LOWER URETERAL STONE, LEFT PYELOSCOPY, REPLACE LEFT DOUBLE J STENT REMOVE RIGHT DOUBLE J STENT RIGHT RETROGRADE PYELOGRAM, REPLACEMENT RIGHT DOUBLE J STENT ;  Surgeon: Carolan Clines, MD;  [facility];  Service: Urology;  Laterality: Bilateral;  . KNEE ARTHROSCOPY Left 1997    Social History   Social History  . Marital status: Married    Spouse name: N/A  . Number of children: N/A  . Years of education: N/A   Occupational History  . Not on file.   Social History Main Topics  . Smoking status: Never Smoker  . Smokeless tobacco: Never Used  . Alcohol use No  . Drug use: No  . Sexual activity: Not on file   Other Topics Concern  . Not on file   Social History Narrative  . No narrative on file   Family History  Problem Relation Age of Onset  . Family history unknown: Yes      VITAL SIGNS BP 136/84   Pulse 68   Temp 97.4 F (36.3 C)   Resp 18   Ht 4\' 11"  (1.499 m)   Wt 162 lb 3.2 oz (73.6 kg)  SpO2 96%   BMI 32.76 kg/m   Patient's Medications  New Prescriptions   No medications on file  Previous Medications   ACETAMINOPHEN (TYLENOL) 325 MG TABLET    Take 650 mg by mouth every 6 (six) hours as needed for moderate pain.   ACETAMINOPHEN (TYLENOL) 500 MG TABLET    Take 1,000 mg by mouth daily. Pain management   ASPIRIN 325 MG EC TABLET    Take 325 mg by mouth daily.   ATORVASTATIN (LIPITOR) 10 MG TABLET    Take 10 mg by mouth daily at 6 PM.   BISACODYL (DULCOLAX) 5 MG EC TABLET    Take 10 mg by mouth daily as needed for moderate constipation.   CHOLECALCIFEROL (VITAMIN D) 1000 UNITS TABLET    Take 2,000 Units by mouth daily.   DOCUSATE SODIUM (COLACE) 100 MG  CAPSULE    Take 100 mg by mouth daily as needed for mild constipation.    INSULIN GLARGINE (LANTUS) 100 UNIT/ML INJECTION    Inject 20-25 Units into the skin 2 (two) times daily. 20 units at bedtime and 25 units in the AM   LISINOPRIL (PRINIVIL,ZESTRIL) 5 MG TABLET    Take 5 mg by mouth daily.    LORATADINE (CLARITIN) 10 MG TABLET    Take 10 mg by mouth daily as needed for allergies.    METOPROLOL TARTRATE (LOPRESSOR) 25 MG TABLET    Take 25 mg by mouth 2 (two) times daily. For HTN  Hold if B/P < 100/60 or HR < 60   NITROFURANTOIN, MACROCRYSTAL-MONOHYDRATE, (MACROBID) 100 MG CAPSULE    Take 1 capsule (100 mg total) by mouth at bedtime.   ONDANSETRON (ZOFRAN) 8 MG TABLET    Take 8 mg by mouth every 8 (eight) hours as needed for nausea or vomiting.   OXYCODONE-ACETAMINOPHEN (ROXICET) 5-325 MG TABLET    Take 1 tablet by mouth every 4 (four) hours as needed for severe pain. Not to exceed 3000mg  of APAP in 24 hours   PHENAZOPYRIDINE (PYRIDIUM) 200 MG TABLET    Take 200 mg by mouth every 8 (eight) hours as needed for pain.   POLYETHYLENE GLYCOL (MIRALAX / GLYCOLAX) PACKET    Take 17 g by mouth daily as needed.   POLYVINYL ALCOHOL-POVIDONE (FRESHKOTE) 2.7-2 % SOLN    Apply 1 drop to eye 3 (three) times daily. To both eyes   PROMETHAZINE (PHENERGAN) 25 MG TABLET    Take 25 mg by mouth every 6 (six) hours as needed for nausea or vomiting.   SENNA-DOCUSATE (SENOKOT S) 8.6-50 MG PER TABLET    Take 2 tablets by mouth at bedtime.    SKIN PROTECTANTS, MISC. (CALAZIME SKIN PROTECTANT EX)    Apply 1 application topically 2 (two) times daily.  Modified Medications   No medications on file  Discontinued Medications   MAGNESIUM HYDROXIDE (MILK OF MAGNESIA) 400 MG/5ML SUSPENSION    Take 45 mLs by mouth daily as needed for mild constipation.   NYSTATIN (MYCOSTATIN/NYSTOP) POWDER    Apply under breast two times daily for rash   OMEPRAZOLE (PRILOSEC) 20 MG CAPSULE    Take 20 mg by mouth daily.   SACCHAROMYCES BOULARDII  (FLORASTOR PO)    Take 1 tablet by mouth 2 (two) times daily.     SIGNIFICANT DIAGNOSTIC EXAMS  05-13-15: chest x-ray: no active disease is seen in chest  05-16-15: chest x-ray; Stable mild chronic bronchitic changes.  No acute abnormality.   05-18-15: chest x-ray: No acute abnormality.  Mild chronic interstitial lung disease.   05-18-15: ct of abdomen and pelvis: 1. Motion and hardware degraded images. 2. Moderate left-sided hydroureteronephrosis secondary to a distal left ureteric 11 x 13 mm calculus. Decreased left-sided renal function. 3. Large burden bilateral renal collecting system stones, including stones within both extrarenal pelves. 4. Cirrhosis with small volume ascites. 5. Proctitis, likely related to C. difficile colitis, given the clinical history. 6. Bladder wall irregularity which could relate to cystitis. Bladder diverticulum, suggesting a component of outlet obstruction. 7. Skin irregularity superficial to the coccyx. Recommend physical exam correlation to exclude decubitus ulcer. No gross osseous destruction.  03-31-16: chest x-ray: no acute cardiopulmonary disease  03-31-16: kub: mild constipation    LABS REVIEWED:   05-14-15; wbc 15.6; hgb 11.1 ;hct 33.3; mcv 92.6; plt 243; glucose 84; bun 36; creat 1.45; k+ 4.0; na++134  Liver normal albumin 2.9  05-16-15: wbc 18.0; hgb 10.4; hct 31.4; mcv 93.2 ;plt 293; glucose 108; bun 26; creat 1.28; k+ 3.7; na++135; liver normal albumin 2.6; urine culture: no growth; blood culture: no growth 05-17-15: glucose 83; bun 23; creat 1.15; k+ 3.8; na++140  hgb a1c 7.0 05-19-15: wbc 20.2; hgb 9.0; hgb 27.3; mcv 95.5; plt 309; glucose 58; bun 13; creat 1.01; k+ 4.3; na++13 05-21-15: wbc 13.5; hgb 9.3; hct 28.3; mcv 95.3; plt 433 05-26-15: glucose 123; bun 13; creat 0.7; k+ 5.0; na++ 137  06-18-15: wbc 8.7; hgb 11.2; hct 36.7; mcv 96.0; ;plt 278  11-05-15: wbc 8.3; hgb 12.5; hct 36.8; mcv 91.1; plt 292; glucose 113; bun 15.9; creat 0.86; k+ 4.9;  na++ 140; liver normal albumin 4.1  03-31-16: wbc 18.7; hgb 13.9; hct 41.8; mcv 95.7; plt 310; glucose 160; bun 17.2; creat 0.74; k+ 4.1; na++ 138; liver normal albumin 4.2    Review of Systems Constitutional: Negative for appetite change and fatigue.  Respiratory: Negative for cough, chest tightness and shortness of breath.   Cardiovascular: Negative for chest pain, palpitations and leg swelling.  Gastrointestinal: Negative for nausea, abdominal pain, diarrhea and constipation.  Musculoskeletal: Negative for myalgias and arthralgias.  Skin: Negative for pallor.  Psychiatric/Behavioral: The patient is not nervous/anxious.       Physical Exam Constitutional: No distress.  Eyes: Conjunctivae are normal.  Neck: Neck supple. No JVD present. No thyromegaly present.  Cardiovascular: Normal rate, regular rhythm and intact distal pulses.   Respiratory: Effort normal and breath sounds normal. No respiratory distress. She has no wheezes.  GI: Soft. Bowel sounds are normal. She exhibits no distension. There is no tenderness.  Musculoskeletal: She exhibits no edema.  Able to move all extremities   Lymphadenopathy:    She has no cervical adenopathy.  Neurological: She is alert.  Skin: Skin is warm and dry. She is not diaphoretic.  Psychiatric: She has a normal mood and affect.     ASSESSMENT/ PLAN:  1. Hypertension: will continue lisinopril 5 mg daily; and lopressor 25 mg twice daily  Asa 325 mg daily    2. Diabetes:  hgb a1c is 7.0  Her cbgs are good; will lower her lantus to 20 units in the AM and 15 units in the PM   3. Depression: will continue colace daily prn;  miralax daily and senna s 2 tabs daily   4. Dyslipidemia: will continue lipitor 10 mg daily ldl is 71; will monitor  5. Osteoarthritis: pain is presently being managed; will continue percocet 5/325 mg every 4 hours as needed   6. Depression with psychosis: is presently  stable is presently not taking medications; will  monitor   7. Alzheimer's disease: no change in status; is presently not taking medications will not make changes will monitor her status.  Her current weight is 162 pounds.   8. Nephrolithiasis; outlet obstruction: is on long term macrobid daily  is followed by urology.    Will get hgb a1c lipids; th  urine for micro-albumin   MD is aware of resident's narcotic use and is in agreement with current plan of care. We will attempt to wean resident as apropriate    Ok Edwards NP [facility]  Contact [phone] Monday through Friday 8am- 5pm  After hours call [phone]

## 2016-06-15 ENCOUNTER — Non-Acute Institutional Stay (SKILLED_NURSING_FACILITY): Payer: Medicare Other | Admitting: Adult Health

## 2016-06-15 ENCOUNTER — Encounter: Payer: Self-pay | Admitting: Adult Health

## 2016-06-15 DIAGNOSIS — G308 Other Alzheimer's disease: Secondary | ICD-10-CM | POA: Diagnosis not present

## 2016-06-15 DIAGNOSIS — E1159 Type 2 diabetes mellitus with other circulatory complications: Secondary | ICD-10-CM

## 2016-06-15 DIAGNOSIS — Z794 Long term (current) use of insulin: Secondary | ICD-10-CM | POA: Diagnosis not present

## 2016-06-15 DIAGNOSIS — I10 Essential (primary) hypertension: Secondary | ICD-10-CM | POA: Diagnosis not present

## 2016-06-15 DIAGNOSIS — F0280 Dementia in other diseases classified elsewhere without behavioral disturbance: Secondary | ICD-10-CM

## 2016-06-15 DIAGNOSIS — E785 Hyperlipidemia, unspecified: Secondary | ICD-10-CM

## 2016-06-15 DIAGNOSIS — E1169 Type 2 diabetes mellitus with other specified complication: Secondary | ICD-10-CM | POA: Diagnosis not present

## 2016-06-15 DIAGNOSIS — N390 Urinary tract infection, site not specified: Secondary | ICD-10-CM

## 2016-06-15 DIAGNOSIS — E119 Type 2 diabetes mellitus without complications: Secondary | ICD-10-CM | POA: Diagnosis not present

## 2016-06-15 NOTE — Progress Notes (Addendum)
Location:   [facility] Room Number: 115 B Place of Service:  SNF (31)   CODE STATUS: Full Code  No Known Allergies  Chief Complaint  Patient presents with  . Medical Management of Chronic Issues    1 month follow up    HPI:  She is a long term resident of this facility being seen for the management of her chronic illnesses. She does spend nearly all of her time in bed per her choice. She is not voicing any complaints stating that she is feeling good. There are no nursing concerns at this time.    Past Medical History:  Diagnosis Date  . Acute pyelonephritis   . AKI (acute kidney injury) ([place])   . Alzheimer's dementia   . Alzheimer's disease   . Bladder outlet obstruction   . Depression   . Diarrhea   . Dyslipidemia   . Environmental and seasonal allergies   . Essential hypertension, benign   . Foley catheter in place   . GERD (gastroesophageal reflux disease)   . History of urinary tract infection   . Hypertension   . Hypothyroidism   . Insomnia   . Nephrolithiasis   . Osteoarthritis   . Reflux esophagitis   . Rickets, active   . Sepsis ([place])   . Type II or unspecified type diabetes mellitus without mention of complication, uncontrolled   . Unspecified constipation   . Unspecified psychosis   . Unspecified vitamin D deficiency     Past Surgical History:  Procedure Laterality Date  . ABDOMINAL HYSTERECTOMY    . CYSTOSCOPY W/ URETERAL STENT PLACEMENT Bilateral 05/18/2015   Procedure: CYSTOSCOPY WITH RETROGRADE PYELOGRAM/URETERAL STENT PLACEMENT;  Surgeon: Carolan Clines, MD;  Location: [facility];  Service: Urology;  Laterality: Bilateral;  . CYSTOSCOPY W/ URETERAL STENT REMOVAL Bilateral 09/12/2015   Procedure: CYSTOSCOPY WITH STENT REMOVAL;  Surgeon: Carolan Clines, MD;  Location: [facility];  Service: Urology;  Laterality: Bilateral;  1 HOUR  . CYSTOSCOPY WITH RETROGRADE PYELOGRAM, URETEROSCOPY AND STENT PLACEMENT Bilateral 07/18/2015   Procedure:  CYSTOSCOPY REMOVE LEFT DOUBLE J STENT LEFT RETROGRADE PYELOGRAM, LEFT URETEROSCOPY, PYELOSCOPY, REPLACEMENT OF LEFT DOUBLE J STENT;  Surgeon: Carolan Clines, MD;  [facility]: [facility];  Service: Urology;  Laterality: Bilateral;  . HOLMIUM LASER APPLICATION Bilateral 1/61/0960   Procedure: HOLMIUM LASER OF LEFT LOWER URETERAL STONE, LEFT PYELOSCOPY, REPLACE LEFT DOUBLE J STENT REMOVE RIGHT DOUBLE J STENT RIGHT RETROGRADE PYELOGRAM, REPLACEMENT RIGHT DOUBLE J STENT ;  Surgeon: Carolan Clines, MD;  Location: [facility];  Service: Urology;  Laterality: Bilateral;  . KNEE ARTHROSCOPY Left 1997    Social History   Social History  . Marital status: Married    Spouse name: N/A  . Number of children: N/A  . Years of education: N/A   Occupational History  . Not on file.   Social History Main Topics  . Smoking status: Never Smoker  . Smokeless tobacco: Never Used  . Alcohol use No  . Drug use: No  . Sexual activity: Not on file   Other Topics Concern  . Not on file   Social History Narrative  . No narrative on file   Family History  Problem Relation Age of Onset  . Family history unknown: Yes      VITAL SIGNS BP 138/68   Pulse 76   Temp 97.7 F (36.5 C)   Resp 18   Ht 4\' 11"  (1.499 m)   Wt 156 lb 6.4 oz (70.9 kg)  SpO2 98%   BMI 31.59 kg/m   Patient's Medications  New Prescriptions   No medications on file  Previous Medications   ACETAMINOPHEN (TYLENOL) 325 MG TABLET    Take 650 mg by mouth every 6 (six) hours as needed for moderate pain.   ACETAMINOPHEN (TYLENOL) 500 MG TABLET    Take 1,000 mg by mouth daily. Pain management   ASPIRIN 325 MG EC TABLET    Take 325 mg by mouth daily.   ATORVASTATIN (LIPITOR) 10 MG TABLET    Take 10 mg by mouth daily at 6 PM.   BISACODYL (DULCOLAX) 5 MG EC TABLET    Take 10 mg by mouth daily as needed for moderate constipation.   CHOLECALCIFEROL (VITAMIN D) 1000 UNITS TABLET    Take 2,000 Units by mouth daily.   DOCUSATE SODIUM (COLACE)  100 MG CAPSULE    Take 100 mg by mouth daily as needed for mild constipation.    INSULIN GLARGINE (LANTUS) 100 UNIT/ML INJECTION    Inject 15-20 Units into the skin 2 (two) times daily. 15 units at bedtime and 20 units in the AM   LISINOPRIL (PRINIVIL,ZESTRIL) 5 MG TABLET    Take 5 mg by mouth daily.    LORATADINE (CLARITIN) 10 MG TABLET    Take 10 mg by mouth daily as needed for allergies.    METOPROLOL TARTRATE (LOPRESSOR) 25 MG TABLET    Take 25 mg by mouth 2 (two) times daily. For HTN  Hold if B/P < 100/60 or HR < 60   NITROFURANTOIN, MACROCRYSTAL-MONOHYDRATE, (MACROBID) 100 MG CAPSULE    Take 1 capsule (100 mg total) by mouth at bedtime.   ONDANSETRON (ZOFRAN) 8 MG TABLET    Take 8 mg by mouth every 8 (eight) hours as needed for nausea or vomiting.   OXYCODONE-ACETAMINOPHEN (ROXICET) 5-325 MG TABLET    Take 1 tablet by mouth every 4 (four) hours as needed for severe pain. Not to exceed 3000mg  of APAP in 24 hours   PHENAZOPYRIDINE (PYRIDIUM) 200 MG TABLET    Take 200 mg by mouth every 8 (eight) hours as needed for pain.   POLYETHYLENE GLYCOL (MIRALAX / GLYCOLAX) PACKET    Take 17 g by mouth daily as needed.   POLYVINYL ALCOHOL-POVIDONE (FRESHKOTE) 2.7-2 % SOLN    Apply 1 drop to eye 3 (three) times daily. To both eyes   PROMETHAZINE (PHENERGAN) 25 MG TABLET    Take 25 mg by mouth every 6 (six) hours as needed for nausea or vomiting.   SENNA-DOCUSATE (SENOKOT S) 8.6-50 MG PER TABLET    Take 2 tablets by mouth at bedtime.    SKIN PROTECTANTS, MISC. (CALAZIME SKIN PROTECTANT EX)    Apply 1 application topically 2 (two) times daily.  Modified Medications   No medications on file  Discontinued Medications   No medications on file     SIGNIFICANT DIAGNOSTIC EXAMS  05-13-15: chest x-ray: no active disease is seen in chest  05-16-15: chest x-ray; Stable mild chronic bronchitic changes.  No acute abnormality.   05-18-15: chest x-ray: No acute abnormality.  Mild chronic interstitial lung disease.    05-18-15: ct of abdomen and pelvis: 1. Motion and hardware degraded images. 2. Moderate left-sided hydroureteronephrosis secondary to a distal left ureteric 11 x 13 mm calculus. Decreased left-sided renal function. 3. Large burden bilateral renal collecting system stones, including stones within both extrarenal pelves. 4. Cirrhosis with small volume ascites. 5. Proctitis, likely related to C. difficile colitis, given the  clinical history. 6. Bladder wall irregularity which could relate to cystitis. Bladder diverticulum, suggesting a component of outlet obstruction. 7. Skin irregularity superficial to the coccyx. Recommend physical exam correlation to exclude decubitus ulcer. No gross osseous destruction.  03-31-16: chest x-ray: no acute cardiopulmonary disease  03-31-16: kub: mild constipation    LABS REVIEWED:   06-18-15: wbc 8.7; hgb 11.2; hct 36.7; mcv 96.0; ;plt 278  11-05-15: wbc 8.3; hgb 12.5; hct 36.8; mcv 91.1; plt 292; glucose 113; bun 15.9; creat 0.86; k+ 4.9; na++ 140; liver normal albumin 4.1  03-31-16: wbc 18.7; hgb 13.9; hct 41.8; mcv 95.7; plt 310; glucose 160; bun 17.2; creat 0.74; k+ 4.1; na++ 138; liver normal albumin 4.2 05-14-16: tsh 2.18; hgb a1c 5.9; chol 136; ldl 48; trig 248; hdl 39    Review of Systems Constitutional: Negative for appetite change and fatigue.  Respiratory: Negative for cough, chest tightness and shortness of breath.   Cardiovascular: Negative for chest pain, palpitations and leg swelling.  Gastrointestinal: Negative for nausea, abdominal pain, diarrhea and constipation.  Musculoskeletal: Negative for myalgias and arthralgias.  Skin: Negative for pallor.  Psychiatric/Behavioral: The patient is not nervous/anxious.       Physical Exam Constitutional: No distress.  Eyes: Conjunctivae are normal.  Neck: Neck supple. No JVD present. No thyromegaly present.  Cardiovascular: Normal rate, regular rhythm and intact distal pulses.   Respiratory: Effort  normal and breath sounds normal. No respiratory distress. She has no wheezes.  GI: Soft. Bowel sounds are normal. She exhibits no distension. There is no tenderness.  Musculoskeletal: She exhibits no edema.  Able to move all extremities   Lymphadenopathy:    She has no cervical adenopathy.  Neurological: She is alert.  Skin: Skin is warm and dry. She is not diaphoretic.  Psychiatric: She has a normal mood and affect.     ASSESSMENT/ PLAN:  1. Hypertension:b/p 138/68  will continue lisinopril 5 mg daily; and lopressor 25 mg twice daily  Asa 325 mg daily    2. Diabetes:  hgb a1c is 5.9 Her cbgs are good; will continue  lantus  20 units in the AM and 15 units in the PM   3. Depression: will continue colace daily prn;  miralax daily and senna s 2 tabs daily   4. Dyslipidemia: will continue lipitor 10 mg daily ldl is 48; will monitor  5. Osteoarthritis: pain is presently being managed; will continue percocet 5/325 mg every 4 hours as needed   6. Depression with psychosis: is presently stable is presently not taking medications; will monitor   7. Alzheimer's disease: no change in status; is presently not taking medications will not make changes will monitor her status.  Her current weight is 156  pounds.   8. Nephrolithiasis; outlet obstruction: is on long term macrobid daily  is followed by urology.      MD is aware of resident's narcotic use and is in agreement with current plan of care. We will attempt to wean resident as apropriate    Ok Edwards NP [facility]  Contact [phone] Monday through Friday 8am- 5pm  After hours call [phone]

## 2016-07-13 ENCOUNTER — Encounter: Payer: Self-pay | Admitting: Adult Health

## 2016-07-13 ENCOUNTER — Non-Acute Institutional Stay (SKILLED_NURSING_FACILITY): Payer: Medicare Other | Admitting: Adult Health

## 2016-07-13 DIAGNOSIS — E785 Hyperlipidemia, unspecified: Secondary | ICD-10-CM

## 2016-07-13 DIAGNOSIS — Z794 Long term (current) use of insulin: Secondary | ICD-10-CM

## 2016-07-13 DIAGNOSIS — M159 Polyosteoarthritis, unspecified: Secondary | ICD-10-CM

## 2016-07-13 DIAGNOSIS — M150 Primary generalized (osteo)arthritis: Secondary | ICD-10-CM | POA: Diagnosis not present

## 2016-07-13 DIAGNOSIS — E034 Atrophy of thyroid (acquired): Secondary | ICD-10-CM

## 2016-07-13 DIAGNOSIS — F0280 Dementia in other diseases classified elsewhere without behavioral disturbance: Secondary | ICD-10-CM | POA: Diagnosis not present

## 2016-07-13 DIAGNOSIS — G308 Other Alzheimer's disease: Secondary | ICD-10-CM

## 2016-07-13 DIAGNOSIS — E119 Type 2 diabetes mellitus without complications: Secondary | ICD-10-CM

## 2016-07-13 DIAGNOSIS — E1169 Type 2 diabetes mellitus with other specified complication: Secondary | ICD-10-CM

## 2016-07-13 DIAGNOSIS — E1159 Type 2 diabetes mellitus with other circulatory complications: Secondary | ICD-10-CM | POA: Diagnosis not present

## 2016-07-13 DIAGNOSIS — N320 Bladder-neck obstruction: Secondary | ICD-10-CM | POA: Diagnosis not present

## 2016-07-13 DIAGNOSIS — I10 Essential (primary) hypertension: Secondary | ICD-10-CM | POA: Diagnosis not present

## 2016-07-13 DIAGNOSIS — I152 Hypertension secondary to endocrine disorders: Secondary | ICD-10-CM

## 2016-07-13 NOTE — Progress Notes (Signed)
Location:   [facility] Room Number: 115 B Place of Service:  SNF (31)   CODE STATUS: Full Code  No Known Allergies  Chief Complaint  Patient presents with  . Medical Management of Chronic Issues    1 month follow up    HPI:  She is a long term resident of this facility being seen for the management of her chronic illnesses. She does spend nearly all of her time in bed per her choice. She is not voicing any complaints. She is losing weight. Her weight in April 2018: 162; pounds; her current weight is 154 pounds. Staff reports that she is eating well. She will need supplements for her nutritional needs.    Past Medical History:  Diagnosis Date  . Acute pyelonephritis   . AKI (acute kidney injury) ([place])   . Alzheimer's dementia   . Alzheimer's disease   . Bladder outlet obstruction   . Depression   . Diarrhea   . Dyslipidemia   . Environmental and seasonal allergies   . Essential hypertension, benign   . Foley catheter in place   . GERD (gastroesophageal reflux disease)   . History of urinary tract infection   . Hypertension   . Hypothyroidism   . Insomnia   . Nephrolithiasis   . Osteoarthritis   . Reflux esophagitis   . Rickets, active   . Sepsis ([place])   . Type II or unspecified type diabetes mellitus without mention of complication, uncontrolled   . Unspecified constipation   . Unspecified psychosis   . Unspecified vitamin D deficiency     Past Surgical History:  Procedure Laterality Date  . ABDOMINAL HYSTERECTOMY    . CYSTOSCOPY W/ URETERAL STENT PLACEMENT Bilateral 05/18/2015   Procedure: CYSTOSCOPY WITH RETROGRADE PYELOGRAM/URETERAL STENT PLACEMENT;  Surgeon: Carolan Clines, MD;  [facility];  Service: Urology;  Laterality: Bilateral;  . CYSTOSCOPY W/ URETERAL STENT REMOVAL Bilateral 09/12/2015   Procedure: CYSTOSCOPY WITH STENT REMOVAL;  Surgeon: Carolan Clines, MD;  [facility]: [facility];  Service: Urology;  Laterality: Bilateral;  1 HOUR   . CYSTOSCOPY WITH RETROGRADE PYELOGRAM, URETEROSCOPY AND STENT PLACEMENT Bilateral 07/18/2015   Procedure: CYSTOSCOPY REMOVE LEFT DOUBLE J STENT LEFT RETROGRADE PYELOGRAM, LEFT URETEROSCOPY, PYELOSCOPY, REPLACEMENT OF LEFT DOUBLE J STENT;  Surgeon: Carolan Clines, MD;  Location: [facility];  Service: Urology;  Laterality: Bilateral;  . HOLMIUM LASER APPLICATION Bilateral 1/70/0174   Procedure: HOLMIUM LASER OF LEFT LOWER URETERAL STONE, LEFT PYELOSCOPY, REPLACE LEFT DOUBLE J STENT REMOVE RIGHT DOUBLE J STENT RIGHT RETROGRADE PYELOGRAM, REPLACEMENT RIGHT DOUBLE J STENT ;  Surgeon: Carolan Clines, MD;  Location: [facility];  Service: Urology;  Laterality: Bilateral;  . KNEE ARTHROSCOPY Left 1997    Social History   Social History  . Marital status: Married    Spouse name: N/A  . Number of children: N/A  . Years of education: N/A   Occupational History  . Not on file.   Social History Main Topics  . Smoking status: Never Smoker  . Smokeless tobacco: Never Used  . Alcohol use No  . Drug use: No  . Sexual activity: Not on file   Other Topics Concern  . Not on file   Social History Narrative  . No narrative on file   Family History  Problem Relation Age of Onset  . Family history unknown: Yes      VITAL SIGNS BP 132/74   Pulse 72   Temp 98.2 F (36.8 C)   Resp (!)  24   Ht 4\' 11"  (1.499 m)   Wt 154 lb 1.6 oz (69.9 kg)   SpO2 96%   BMI 31.12 kg/m   Patient's Medications  New Prescriptions   No medications on file  Previous Medications   ACETAMINOPHEN (TYLENOL) 325 MG TABLET    Take 650 mg by mouth every 6 (six) hours as needed for moderate pain.   ACETAMINOPHEN (TYLENOL) 500 MG TABLET    Take 1,000 mg by mouth daily. Pain management   ASPIRIN 325 MG EC TABLET    Take 325 mg by mouth daily.   ATORVASTATIN (LIPITOR) 10 MG TABLET    Take 10 mg by mouth daily at 6 PM.   BISACODYL (DULCOLAX) 5 MG EC TABLET    Take 10 mg by mouth daily as needed for moderate constipation.    CHOLECALCIFEROL (VITAMIN D) 1000 UNITS TABLET    Take 2,000 Units by mouth daily.   DOCUSATE SODIUM (COLACE) 100 MG CAPSULE    Take 100 mg by mouth daily as needed for mild constipation.    INSULIN GLARGINE (LANTUS) 100 UNIT/ML INJECTION    Inject 15-20 Units into the skin 2 (two) times daily. 15 units at bedtime and 20 units in the AM   LISINOPRIL (PRINIVIL,ZESTRIL) 5 MG TABLET    Take 5 mg by mouth daily.    LORATADINE (CLARITIN) 10 MG TABLET    Take 10 mg by mouth daily as needed for allergies.    METOPROLOL TARTRATE (LOPRESSOR) 25 MG TABLET    Take 25 mg by mouth 2 (two) times daily. For HTN  Hold if B/P < 100/60 or HR < 60   NITROFURANTOIN, MACROCRYSTAL-MONOHYDRATE, (MACROBID) 100 MG CAPSULE    Take 1 capsule (100 mg total) by mouth at bedtime.   ONDANSETRON (ZOFRAN) 8 MG TABLET    Take 8 mg by mouth every 8 (eight) hours as needed for nausea or vomiting.   OXYCODONE-ACETAMINOPHEN (ROXICET) 5-325 MG TABLET    Take 1 tablet by mouth every 4 (four) hours as needed for severe pain. Not to exceed 3000mg  of APAP in 24 hours   PHENAZOPYRIDINE (PYRIDIUM) 200 MG TABLET    Take 200 mg by mouth every 8 (eight) hours as needed for pain.   POLYETHYLENE GLYCOL (MIRALAX / GLYCOLAX) PACKET    Take 17 g by mouth daily as needed.   POLYVINYL ALCOHOL-POVIDONE (FRESHKOTE) 2.7-2 % SOLN    Apply 1 drop to eye 3 (three) times daily. To both eyes   PROMETHAZINE (PHENERGAN) 25 MG TABLET    Take 25 mg by mouth every 6 (six) hours as needed for nausea or vomiting.   SENNA-DOCUSATE (SENOKOT S) 8.6-50 MG PER TABLET    Take 2 tablets by mouth at bedtime.    SKIN PROTECTANTS, MISC. (CALAZIME SKIN PROTECTANT EX)    Apply 1 application topically 2 (two) times daily.  Modified Medications   No medications on file  Discontinued Medications   No medications on file     SIGNIFICANT DIAGNOSTIC EXAMS  05-18-15: ct of abdomen and pelvis: 1. Motion and hardware degraded images. 2. Moderate left-sided hydroureteronephrosis  secondary to a distal left ureteric 11 x 13 mm calculus. Decreased left-sided renal function. 3. Large burden bilateral renal collecting system stones, including stones within both extrarenal pelves. 4. Cirrhosis with small volume ascites. 5. Proctitis, likely related to C. difficile colitis, given the clinical history. 6. Bladder wall irregularity which could relate to cystitis. Bladder diverticulum, suggesting a component of outlet obstruction. 7.  Skin irregularity superficial to the coccyx. Recommend physical exam correlation to exclude decubitus ulcer. No gross osseous destruction.  03-31-16: chest x-ray: no acute cardiopulmonary disease  03-31-16: kub: mild constipation    LABS REVIEWED:   11-05-15: wbc 8.3; hgb 12.5; hct 36.8; mcv 91.1; plt 292; glucose 113; bun 15.9; creat 0.86; k+ 4.9; na++ 140; liver normal albumin 4.1  03-31-16: wbc 18.7; hgb 13.9; hct 41.8; mcv 95.7; plt 310; glucose 160; bun 17.2; creat 0.74; k+ 4.1; na++ 138; liver normal albumin 4.2 05-14-16: tsh 2.18; hgb a1c 5.9; chol 136; ldl 48; trig 248; hdl 39    Review of Systems Constitutional: Negative for appetite change and fatigue.  Respiratory: Negative for cough, chest tightness and shortness of breath.   Cardiovascular: Negative for chest pain, palpitations and leg swelling.  Gastrointestinal: Negative for nausea, abdominal pain, diarrhea and constipation.  Musculoskeletal: Negative for myalgias and arthralgias.  Skin: Negative for pallor.  Psychiatric/Behavioral: The patient is not nervous/anxious.       Physical Exam Constitutional: No distress.  Eyes: Conjunctivae are normal.  Neck: Neck supple. No JVD present. No thyromegaly present.  Cardiovascular: Normal rate, regular rhythm and intact distal pulses.   Respiratory: Effort normal and breath sounds normal. No respiratory distress. She has no wheezes.  GI: Soft. Bowel sounds are normal. She exhibits no distension. There is no tenderness.    Musculoskeletal: She exhibits no edema.  Able to move all extremities   Lymphadenopathy:    She has no cervical adenopathy.  Neurological: She is alert.  Skin: Skin is warm and dry. She is not diaphoretic.  Psychiatric: She has a normal mood and affect.     ASSESSMENT/ PLAN:  1. Hypertension:b/p 132/74 will continue lisinopril 5 mg daily; and lopressor 25 mg twice daily  Asa 325 mg daily    2. Diabetes:  hgb a1c is 5.9 Her cbgs are good; will continue  lantus  20 units in the AM and 15 units in the PM   3. Depression: will continue colace daily prn;  miralax daily and senna s 2 tabs daily   4. Dyslipidemia: will continue lipitor 10 mg daily ldl is 48; will monitor  5. Osteoarthritis: pain is presently being managed; will continue tylenol 1 gm daily and  percocet 5/325 mg every 4 hours as needed   6. Depression with psychosis: is presently stable is presently not taking medications; will monitor   7. Alzheimer's disease: no change in status; is presently not taking medications will not make changes will monitor her status.  Her current weight is 154  pounds.   8. Nephrolithiasis; outlet obstruction: is on long term macrobid daily  is followed by urology.   9. Weight loss: her current weight is 154 pounds; her weight in April 2018: 162 pounds. Will continue supplements per facility protocol.   Will check cbc; cmp     MD is aware of resident's narcotic use and is in agreement with current plan of care. We will attempt to wean resident as apropriate     Ok Edwards NP [facility]  Contact [phone] Monday through Friday 8am- 5pm  After hours call [phone]

## 2016-07-29 ENCOUNTER — Non-Acute Institutional Stay (SKILLED_NURSING_FACILITY): Payer: Medicare Other

## 2016-07-29 DIAGNOSIS — Z0000 Encounter for general adult medical examination without abnormal findings: Secondary | ICD-10-CM

## 2016-07-29 NOTE — Progress Notes (Signed)
Subjective:   Ana Newman is a 81 y.o. female who presents for an Initial Medicare Annual Wellness Visit at [facility]       Objective:    Today's Vitals   07/29/16 1157  BP: (!) 120/55  Pulse: 73  Temp: 98 F (36.7 C)  TempSrc: Oral  SpO2: 94%  Weight: 154 lb (69.9 kg)  Height: 4\' 11"  (1.499 m)   Body mass index is 31.1 kg/m.   Current Medications (verified) Outpatient Encounter Prescriptions as of 07/29/2016  Medication Sig  . acetaminophen (TYLENOL) 325 MG tablet Take 650 mg by mouth every 6 (six) hours as needed for moderate pain.  Marland Kitchen acetaminophen (TYLENOL) 500 MG tablet Take 1,000 mg by mouth daily. Pain management  . aspirin 325 MG EC tablet Take 325 mg by mouth daily.  Marland Kitchen atorvastatin (LIPITOR) 10 MG tablet Take 10 mg by mouth daily at 6 PM.  . bisacodyl (DULCOLAX) 5 MG EC tablet Take 10 mg by mouth daily as needed for moderate constipation.  . cholecalciferol (VITAMIN D) 1000 UNITS tablet Take 2,000 Units by mouth daily.  Marland Kitchen docusate sodium (COLACE) 100 MG capsule Take 100 mg by mouth daily as needed for mild constipation.   . insulin glargine (LANTUS) 100 UNIT/ML injection Inject 15-20 Units into the skin 2 (two) times daily. 15 units at bedtime and 20 units in the AM  . lisinopril (PRINIVIL,ZESTRIL) 5 MG tablet Take 5 mg by mouth daily.   Marland Kitchen loratadine (CLARITIN) 10 MG tablet Take 10 mg by mouth daily as needed for allergies.   . metoprolol tartrate (LOPRESSOR) 25 MG tablet Take 25 mg by mouth 2 (two) times daily. For HTN  Hold if B/P < 100/60 or HR < 60  . nitrofurantoin, macrocrystal-monohydrate, (MACROBID) 100 MG capsule Take 1 capsule (100 mg total) by mouth at bedtime.  . ondansetron (ZOFRAN) 8 MG tablet Take 8 mg by mouth every 8 (eight) hours as needed for nausea or vomiting.  Marland Kitchen oxyCODONE-acetaminophen (ROXICET) 5-325 MG tablet Take 1 tablet by mouth every 4 (four) hours as needed for severe pain. Not to exceed 3000mg  of APAP in 24 hours  .  phenazopyridine (PYRIDIUM) 200 MG tablet Take 200 mg by mouth every 8 (eight) hours as needed for pain.  . polyethylene glycol (MIRALAX / GLYCOLAX) packet Take 17 g by mouth daily as needed.  . Polyvinyl Alcohol-Povidone (FRESHKOTE) 2.7-2 % SOLN Apply 1 drop to eye 3 (three) times daily. To both eyes  . promethazine (PHENERGAN) 25 MG tablet Take 25 mg by mouth every 6 (six) hours as needed for nausea or vomiting.  . senna-docusate (SENOKOT S) 8.6-50 MG per tablet Take 2 tablets by mouth at bedtime.   . Skin Protectants, Misc. (CALAZIME SKIN PROTECTANT EX) Apply 1 application topically 2 (two) times daily.   No facility-administered encounter medications on file as of 07/29/2016.     Allergies (verified) Patient has no known allergies.   History: Past Medical History:  Diagnosis Date  . Acute pyelonephritis   . AKI (acute kidney injury) ([place])   . Alzheimer's dementia   . Alzheimer's disease   . Bladder outlet obstruction   . Depression   . Diarrhea   . Dyslipidemia   . Environmental and seasonal allergies   . Essential hypertension, benign   . Foley catheter in place   . GERD (gastroesophageal reflux disease)   . History of urinary tract infection   . Hypertension   . Hypothyroidism   .  Insomnia   . Nephrolithiasis   . Osteoarthritis   . Reflux esophagitis   . Rickets, active   . Sepsis ([place])   . Type II or unspecified type diabetes mellitus without mention of complication, uncontrolled   . Unspecified constipation   . Unspecified psychosis   . Unspecified vitamin D deficiency    Past Surgical History:  Procedure Laterality Date  . ABDOMINAL HYSTERECTOMY    . CYSTOSCOPY W/ URETERAL STENT PLACEMENT Bilateral 05/18/2015   Procedure: CYSTOSCOPY WITH RETROGRADE PYELOGRAM/URETERAL STENT PLACEMENT;  Surgeon: Carolan Clines, MD;  Location: [facility];  Service: Urology;  Laterality: Bilateral;  . CYSTOSCOPY W/ URETERAL STENT REMOVAL Bilateral 09/12/2015   Procedure: CYSTOSCOPY  WITH STENT REMOVAL;  Surgeon: Carolan Clines, MD;  Location: [facility];  Service: Urology;  Laterality: Bilateral;  1 HOUR  . CYSTOSCOPY WITH RETROGRADE PYELOGRAM, URETEROSCOPY AND STENT PLACEMENT Bilateral 07/18/2015   Procedure: CYSTOSCOPY REMOVE LEFT DOUBLE J STENT LEFT RETROGRADE PYELOGRAM, LEFT URETEROSCOPY, PYELOSCOPY, REPLACEMENT OF LEFT DOUBLE J STENT;  Surgeon: Carolan Clines, MD;  Location: [facility];  Service: Urology;  Laterality: Bilateral;  . HOLMIUM LASER APPLICATION Bilateral 8/56/3149   Procedure: HOLMIUM LASER OF LEFT LOWER URETERAL STONE, LEFT PYELOSCOPY, REPLACE LEFT DOUBLE J STENT REMOVE RIGHT DOUBLE J STENT RIGHT RETROGRADE PYELOGRAM, REPLACEMENT RIGHT DOUBLE J STENT ;  Surgeon: Carolan Clines, MD;  Location: [facility];  Service: Urology;  Laterality: Bilateral;  . KNEE ARTHROSCOPY Left 1997   Family History  Problem Relation Age of Onset  . Family history unknown: Yes   Social History   Occupational History  . Not on file.   Social History Main Topics  . Smoking status: Never Smoker  . Smokeless tobacco: Never Used  . Alcohol use No  . Drug use: No  . Sexual activity: Not on file    Tobacco Counseling Counseling given: Not Answered   Activities of Daily Living In your present state of health, do you have any difficulty performing the following activities: 07/29/2016 09/12/2015  Hearing? Y N  Vision? N N  Difficulty concentrating or making decisions? Y N  Walking or climbing stairs? Y Y  Dressing or bathing? Y Y  Doing errands, shopping? Y -  Conservation officer, nature and eating ? Y -  Using the Toilet? Y -  In the past six months, have you accidently leaked urine? Y -  Do you have problems with loss of bowel control? Y -  Managing your Medications? Y -  Managing your Finances? Y -  Housekeeping or managing your Housekeeping? Y -  Some recent data might be hidden    Immunizations and Health Maintenance Immunization History  Administered Date(s) Administered    . Hepatitis B 11/12/2015  . Influenza Whole 11/09/2012  . Influenza-Unspecified 11/12/2013, 11/11/2014, 11/12/2015  . PPD Test 10/10/2015, 10/17/2015  . Pneumococcal-Unspecified 01/03/2008   There are no preventive care reminders to display for this patient.  Patient Care Team: Gildardo Cranker, DO as PCP - General (Internal Medicine) Nyoka Cowden Phylis Bougie, NP as Nurse Practitioner ([place]) [facility], [place] ([place])  Indicate any recent [place] you may have received from other than [facility] providers in the past year (date may be approximate).     Assessment:   This is a routine wellness examination for Ana Newman.  Hearing/Vision screen No exam data present  Dietary issues and exercise activities discussed: Current Exercise Habits: The patient does not participate in regular exercise at present, Exercise limited by: orthopedic condition(s)  Goals    .  Maintain Lifestyle          Pt will maintain lifestyle.       Depression Screen PHQ 2/9 Scores 07/29/2016  PHQ - 2 Score 0    Fall Risk Fall Risk  07/29/2016  Falls in the past year? No    Cognitive Function:     6CIT Screen 07/29/2016  What Year? 0 points  What month? 0 points  What time? 0 points  Count back from 20 0 points  Months in reverse 4 points  Repeat phrase 6 points  Total Score 10    Screening Tests Health Maintenance  Topic Date Due  . FOOT EXAM  06/15/2017 (Originally 06/02/2016)  . INFLUENZA VACCINE  09/01/2016  . HEMOGLOBIN A1C  11/13/2016  . OPHTHALMOLOGY EXAM  01/11/2017  . TETANUS/TDAP  05/28/2024  . DEXA SCAN  Completed  . PNA vac Low Risk Adult  Completed      Plan:    I have personally reviewed and addressed the Medicare Annual Wellness questionnaire and have noted the following in the patient's chart:  A. Medical and social history B. Use of alcohol, tobacco or illicit drugs  C. Current medications and supplements D. Functional ability and  status E.  Nutritional status F.  Physical activity G. Advance directives H. List of other physicians I.  Hospitalizations, surgeries, and ER visits in previous 12 months J.  [place] to include hearing, vision, cognitive, depression L. Referrals and appointments - none  In addition, I have reviewed and discussed with patient certain preventive protocols, quality metrics, and best practice recommendations. A written personalized care plan for preventive services as well as general preventive health recommendations were provided to patient.  See attached scanned questionnaire for additional information.   Signed,   Rich Reining, RN Nurse Health Advisor   Quick Notes   Health Maintenance: Foot exam due     Abnormal Screen: 6Cit-10     Patient Concerns: None     Nurse Concerns: None

## 2016-07-29 NOTE — Patient Instructions (Signed)
Ana Newman , Thank you for taking time to come for your Medicare Wellness Visit. I appreciate your ongoing commitment to your health goals. Please review the following plan we discussed and let me know if I can assist you in the future.   Screening recommendations/referrals: Colonoscopy up to date, long term resident Mammogram up to date, long term resident Bone Density up to date Recommended yearly ophthalmology/optometry visit for glaucoma screening and checkup Recommended yearly dental visit for hygiene and checkup  Vaccinations: Influenza vaccine up to date. Due 11/11/16 Pneumococcal vaccine up to date Tdap vaccine up to date. Due 05/28/24 Shingles vaccine not in records  Advanced directives: Need a copy for chart  Conditions/risks identified: None  Next appointment: Dr. Eulas Post makes rounds   Preventive Care 65 Years and Older, Female Preventive care refers to lifestyle choices and visits with your health care provider that can promote health and wellness. What does preventive care include?  A yearly physical exam. This is also called an annual well check.  Dental exams once or twice a year.  Routine eye exams. Ask your health care provider how often you should have your eyes checked.  Personal lifestyle choices, including:  Daily care of your teeth and gums.  Regular physical activity.  Eating a healthy diet.  Avoiding tobacco and drug use.  Limiting alcohol use.  Practicing safe sex.  Taking low-dose aspirin every day.  Taking vitamin and mineral supplements as recommended by your health care provider. What happens during an annual well check? The services and screenings done by your health care provider during your annual well check will depend on your age, overall health, lifestyle risk factors, and family history of disease. Counseling  Your health care provider may ask you questions about your:  Alcohol use.  Tobacco use.  Drug use.  Emotional  well-being.  Home and relationship well-being.  Sexual activity.  Eating habits.  History of falls.  Memory and ability to understand (cognition).  Work and work Statistician.  Reproductive health. Screening  You may have the following tests or measurements:  Height, weight, and BMI.  Blood pressure.  Lipid and cholesterol levels. These may be checked every 5 years, or more frequently if you are over 36 years old.  Skin check.  Lung cancer screening. You may have this screening every year starting at age 23 if you have a 30-pack-year history of smoking and currently smoke or have quit within the past 15 years.  Fecal occult blood test (FOBT) of the stool. You may have this test every year starting at age 2.  Flexible sigmoidoscopy or colonoscopy. You may have a sigmoidoscopy every 5 years or a colonoscopy every 10 years starting at age 17.  Hepatitis C blood test.  Hepatitis B blood test.  Sexually transmitted disease (STD) testing.  Diabetes screening. This is done by checking your blood sugar (glucose) after you have not eaten for a while (fasting). You may have this done every 1-3 years.  Bone density scan. This is done to screen for osteoporosis. You may have this done starting at age 85.  Mammogram. This may be done every 1-2 years. Talk to your health care provider about how often you should have regular mammograms. Talk with your health care provider about your test results, treatment options, and if necessary, the need for more tests. Vaccines  Your health care provider may recommend certain vaccines, such as:  Influenza vaccine. This is recommended every year.  Tetanus, diphtheria, and acellular pertussis (  Tdap, Td) vaccine. You may need a Td booster every 10 years.  Zoster vaccine. You may need this after age 13.  Pneumococcal 13-valent conjugate (PCV13) vaccine. One dose is recommended after age 62.  Pneumococcal polysaccharide (PPSV23) vaccine. One  dose is recommended after age 76. Talk to your health care provider about which screenings and vaccines you need and how often you need them. This information is not intended to replace advice given to you by your health care provider. Make sure you discuss any questions you have with your health care provider. Document Released: 02/14/2015 Document Revised: 10/08/2015 Document Reviewed: 11/19/2014 Elsevier Interactive Patient Education  2017 [place] Prevention in the Home Falls can cause injuries. They can happen to people of all ages. There are many things you can do to make your home safe and to help prevent falls. What can I do on the outside of my home?  Regularly fix the edges of walkways and driveways and fix any cracks.  Remove anything that might make you trip as you walk through a door, such as a raised step or threshold.  Trim any bushes or trees on the path to your home.  Use bright outdoor lighting.  Clear any walking paths of anything that might make someone trip, such as rocks or tools.  Regularly check to see if handrails are loose or broken. Make sure that both sides of any steps have handrails.  Any raised decks and porches should have guardrails on the edges.  Have any leaves, snow, or ice cleared regularly.  Use sand or salt on walking paths during winter.  Clean up any spills in your garage right away. This includes oil or grease spills. What can I do in the bathroom?  Use night lights.  Install grab bars by the toilet and in the tub and shower. Do not use towel bars as grab bars.  Use non-skid mats or decals in the tub or shower.  If you need to sit down in the shower, use a plastic, non-slip stool.  Keep the floor dry. Clean up any water that spills on the floor as soon as it happens.  Remove soap buildup in the tub or shower regularly.  Attach bath mats securely with double-sided non-slip rug tape.  Do not have throw rugs and other  things on the floor that can make you trip. What can I do in the bedroom?  Use night lights.  Make sure that you have a light by your bed that is easy to reach.  Do not use any sheets or blankets that are too big for your bed. They should not hang down onto the floor.  Have a firm chair that has side arms. You can use this for support while you get dressed.  Do not have throw rugs and other things on the floor that can make you trip. What can I do in the kitchen?  Clean up any spills right away.  Avoid walking on wet floors.  Keep items that you use a lot in easy-to-reach places.  If you need to reach something above you, use a strong step stool that has a grab bar.  Keep electrical cords out of the way.  Do not use floor polish or wax that makes floors slippery. If you must use wax, use non-skid floor wax.  Do not have throw rugs and other things on the floor that can make you trip. What can I do with my stairs?  Do  not leave any items on the stairs.  Make sure that there are handrails on both sides of the stairs and use them. Fix handrails that are broken or loose. Make sure that handrails are as long as the stairways.  Check any carpeting to make sure that it is firmly attached to the stairs. Fix any carpet that is loose or worn.  Avoid having throw rugs at the top or bottom of the stairs. If you do have throw rugs, attach them to the floor with carpet tape.  Make sure that you have a light switch at the top of the stairs and the bottom of the stairs. If you do not have them, ask someone to add them for you. What else can I do to help prevent falls?  Wear shoes that:  Do not have high heels.  Have rubber bottoms.  Are comfortable and fit you well.  Are closed at the toe. Do not wear sandals.  If you use a stepladder:  Make sure that it is fully opened. Do not climb a closed stepladder.  Make sure that both sides of the stepladder are locked into place.  Ask  someone to hold it for you, if possible.  Clearly mark and make sure that you can see:  Any grab bars or handrails.  First and last steps.  Where the edge of each step is.  Use tools that help you move around (mobility aids) if they are needed. These include:  Canes.  Walkers.  Scooters.  Crutches.  Turn on the lights when you go into a dark area. Replace any light bulbs as soon as they burn out.  Set up your furniture so you have a clear path. Avoid moving your furniture around.  If any of your floors are uneven, fix them.  If there are any pets around you, be aware of where they are.  Review your medicines with your doctor. Some medicines can make you feel dizzy. This can increase your chance of falling. Ask your doctor what other things that you can do to help prevent falls. This information is not intended to replace advice given to you by your health care provider. Make sure you discuss any questions you have with your health care provider. Document Released: 11/14/2008 Document Revised: 06/26/2015 Document Reviewed: 02/22/2014 Elsevier Interactive Patient Education  2017 [employer].

## 2016-08-10 ENCOUNTER — Non-Acute Institutional Stay (SKILLED_NURSING_FACILITY): Payer: Medicare Other | Admitting: Adult Health

## 2016-08-10 ENCOUNTER — Encounter: Payer: Self-pay | Admitting: Adult Health

## 2016-08-10 DIAGNOSIS — Z794 Long term (current) use of insulin: Secondary | ICD-10-CM | POA: Diagnosis not present

## 2016-08-10 DIAGNOSIS — E1159 Type 2 diabetes mellitus with other circulatory complications: Secondary | ICD-10-CM | POA: Diagnosis not present

## 2016-08-10 DIAGNOSIS — E785 Hyperlipidemia, unspecified: Secondary | ICD-10-CM

## 2016-08-10 DIAGNOSIS — E119 Type 2 diabetes mellitus without complications: Secondary | ICD-10-CM | POA: Diagnosis not present

## 2016-08-10 DIAGNOSIS — E1169 Type 2 diabetes mellitus with other specified complication: Secondary | ICD-10-CM

## 2016-08-10 DIAGNOSIS — F0280 Dementia in other diseases classified elsewhere without behavioral disturbance: Secondary | ICD-10-CM | POA: Diagnosis not present

## 2016-08-10 DIAGNOSIS — I10 Essential (primary) hypertension: Secondary | ICD-10-CM | POA: Diagnosis not present

## 2016-08-10 DIAGNOSIS — G308 Other Alzheimer's disease: Secondary | ICD-10-CM | POA: Diagnosis not present

## 2016-08-10 NOTE — Progress Notes (Signed)
Provider:  Ok Edwards, NP Location:  [facility] Room Number: [place] of Service:  SNF (31)   PCP: Gildardo Cranker, DO Patient Care Team: Gildardo Cranker, DO as PCP - General (Internal Medicine) Nyoka Cowden Phylis Bougie, NP as Nurse Practitioner (Geriatric Medicine) [facility], [place] ([place])  Extended Emergency Contact Information Primary Emergency Contact: Smith,Christie Address: [address] [address] [place] Phone: [phone] Relation: Spouse Secondary Emergency Contact: Allen Norris [place] of [place] Phone: [phone] Mobile Phone: [phone] Relation: Son  Code Status: Full Code Goals of Care: Advanced Directive information Advanced Directives 08/10/2016  Does Patient Have a Medical Advance Directive? Yes  Type of Advance Directive Out of facility DNR (pink MOST or yellow form)  Does patient want to make changes to medical advance directive? No - Patient declined  Copy of [place] in Chart? -  Would patient like information on creating a medical advance directive? -  Pre-existing out of facility DNR order (yellow form or pink MOST form) Pink MOST form placed in chart (order not valid for inpatient use)      No Known Allergies   Chief Complaint  Patient presents with  . Annual Exam    Yearly Exam    HPI: Patient is a 81 y.o. female seen today for an annual comprehensive examination. She is being seen for management of her diabetes; Alzheimer's disease; hypertension; and dyslipidemia: Overall there has been little change in her status. She has not had any recently hospitalizations. She tells me that she is feeling good and has no complaints. She does spend nearly all of her time in bed per her choice.  There are no nursing concerns today.   Past Medical History:  Diagnosis Date  . Acute pyelonephritis   . AKI (acute kidney injury) ([place])   .  Alzheimer's dementia   . Alzheimer's disease   . Bladder outlet obstruction   . Depression   . Diarrhea   . Dyslipidemia   . Environmental and seasonal allergies   . Essential hypertension, benign   . Foley catheter in place   . GERD (gastroesophageal reflux disease)   . History of urinary tract infection   . Hypertension   . Hypothyroidism   . Insomnia   . Nephrolithiasis   . Osteoarthritis   . Reflux esophagitis   . Rickets, active   . Sepsis ([place])   . Type II or unspecified type diabetes mellitus without mention of complication, uncontrolled   . Unspecified constipation   . Unspecified psychosis   . Unspecified vitamin D deficiency    Past Surgical History:  Procedure Laterality Date  . ABDOMINAL HYSTERECTOMY    . CYSTOSCOPY W/ URETERAL STENT PLACEMENT Bilateral 05/18/2015   Procedure: CYSTOSCOPY WITH RETROGRADE PYELOGRAM/URETERAL STENT PLACEMENT;  Surgeon: Carolan Clines, MD;  Location: [facility];  Service: Urology;  Laterality: Bilateral;  . CYSTOSCOPY W/ URETERAL STENT REMOVAL Bilateral 09/12/2015   Procedure: CYSTOSCOPY WITH STENT REMOVAL;  Surgeon: Carolan Clines, MD;  Location: [facility];  Service: Urology;  Laterality: Bilateral;  1 HOUR  . CYSTOSCOPY WITH RETROGRADE PYELOGRAM, URETEROSCOPY AND STENT PLACEMENT Bilateral 07/18/2015   Procedure: CYSTOSCOPY REMOVE LEFT DOUBLE J STENT LEFT RETROGRADE PYELOGRAM, LEFT URETEROSCOPY, PYELOSCOPY, REPLACEMENT OF LEFT DOUBLE J STENT;  Surgeon: Carolan Clines, MD;  Location: [facility];  Service: Urology;  Laterality: Bilateral;  . HOLMIUM LASER APPLICATION Bilateral 03/27/8248   Procedure: HOLMIUM LASER  OF LEFT LOWER URETERAL STONE, LEFT PYELOSCOPY, REPLACE LEFT DOUBLE J STENT REMOVE RIGHT DOUBLE J STENT RIGHT RETROGRADE PYELOGRAM, REPLACEMENT RIGHT DOUBLE J STENT ;  Surgeon: Carolan Clines, MD;  Location: [facility];  Service: Urology;  Laterality: Bilateral;  . KNEE ARTHROSCOPY Left 1997    reports that she has never smoked. She  has never used smokeless tobacco. She reports that she does not drink alcohol or use drugs. Social History   Social History  . Marital status: Married    Spouse name: N/A  . Number of children: N/A  . Years of education: N/A   Occupational History  . Not on file.   Social History Main Topics  . Smoking status: Never Smoker  . Smokeless tobacco: Never Used  . Alcohol use No  . Drug use: No  . Sexual activity: Not on file   Other Topics Concern  . Not on file   Social History Narrative  . No narrative on file   Family History  Problem Relation Age of Onset  . Family history unknown: Yes    Vitals:   08/10/16 1105  BP: 129/81  Pulse: 83  Resp: 18  Temp: 97.6 F (36.4 C)  SpO2: 97%  Weight: 160 lb 11.2 oz (72.9 kg)  Height: 4\' 11"  (1.499 m)   Body mass index is 32.46 kg/m.  Current Outpatient Prescriptions on File Prior to Visit  Medication Sig Dispense Refill  . acetaminophen (TYLENOL) 325 MG tablet Take 650 mg by mouth every 6 (six) hours as needed for moderate pain.    Marland Kitchen acetaminophen (TYLENOL) 500 MG tablet Take 1,000 mg by mouth daily. Pain management    . aspirin 325 MG EC tablet Take 325 mg by mouth daily.    Marland Kitchen atorvastatin (LIPITOR) 10 MG tablet Take 10 mg by mouth daily at 6 PM.    . bisacodyl (DULCOLAX) 5 MG EC tablet Take 10 mg by mouth daily as needed for moderate constipation.    . cholecalciferol (VITAMIN D) 1000 UNITS tablet Take 2,000 Units by mouth daily.    Marland Kitchen docusate sodium (COLACE) 100 MG capsule Take 100 mg by mouth daily as needed for mild constipation.     . insulin glargine (LANTUS) 100 UNIT/ML injection Inject 15-20 Units into the skin 2 (two) times daily. 15 units at bedtime and 20 units in the AM    . lisinopril (PRINIVIL,ZESTRIL) 5 MG tablet Take 5 mg by mouth daily.     Marland Kitchen loratadine (CLARITIN) 10 MG tablet Take 10 mg by mouth daily as needed for allergies.     . metoprolol tartrate (LOPRESSOR) 25 MG tablet Take 25 mg by mouth 2 (two)  times daily. For HTN  Hold if B/P < 100/60 or HR < 60    . nitrofurantoin, macrocrystal-monohydrate, (MACROBID) 100 MG capsule Take 1 capsule (100 mg total) by mouth at bedtime. 30 capsule 1  . ondansetron (ZOFRAN) 8 MG tablet Take 8 mg by mouth every 8 (eight) hours as needed for nausea or vomiting.    . phenazopyridine (PYRIDIUM) 200 MG tablet Take 200 mg by mouth every 8 (eight) hours as needed for pain.    . polyethylene glycol (MIRALAX / GLYCOLAX) packet Take 17 g by mouth daily as needed.    . Polyvinyl Alcohol-Povidone (FRESHKOTE) 2.7-2 % SOLN Apply 1 drop to eye 3 (three) times daily. To both eyes    . promethazine (PHENERGAN) 25 MG tablet Take 25 mg by mouth every 6 (six) hours as  needed for nausea or vomiting.    . senna-docusate (SENOKOT S) 8.6-50 MG per tablet Take 2 tablets by mouth at bedtime.     . Skin Protectants, Misc. (CALAZIME SKIN PROTECTANT EX) Apply 1 application topically 2 (two) times daily.     No current facility-administered medications on file prior to visit.       SIGNIFICANT DIAGNOSTIC EXAMS  PREVIOUS:   05-18-15: ct of abdomen and pelvis: 1. Motion and hardware degraded images. 2. Moderate left-sided hydroureteronephrosis secondary to a distal left ureteric 11 x 13 mm calculus. Decreased left-sided renal function. 3. Large burden bilateral renal collecting system stones, including stones within both extrarenal pelves. 4. Cirrhosis with small volume ascites. 5. Proctitis, likely related to C. difficile colitis, given the clinical history. 6. Bladder wall irregularity which could relate to cystitis. Bladder diverticulum, suggesting a component of outlet obstruction. 7. Skin irregularity superficial to the coccyx. Recommend physical exam correlation to exclude decubitus ulcer. No gross osseous destruction.  03-31-16: chest x-ray: no acute cardiopulmonary disease  03-31-16: kub: mild constipation   NO NEW EXAMS   LABS REVIEWED: PREVIOUS  11-05-15: wbc 8.3;  hgb 12.5; hct 36.8; mcv 91.1; plt 292; glucose 113; bun 15.9; creat 0.86; k+ 4.9; na++ 140; liver normal albumin 4.1  03-31-16: wbc 18.7; hgb 13.9; hct 41.8; mcv 95.7; plt 310; glucose 160; bun 17.2; creat 0.74; k+ 4.1; na++ 138; liver normal albumin 4.2 05-14-16: tsh 2.18; hgb a1c 5.9; chol 136; ldl 48; trig 248; hdl 39   REVIEWED TODAY:   04-02-16: glucose 110; bun 30.5; creat 0.99; k+ 4.0; na++ 135; ca 9.3; liver normal albumin 3.5  04-01-16: urine culture: providencia stuartii   Review of Systems  Constitutional: Negative for malaise/fatigue.  HENT: Negative for congestion.   Eyes: Negative.   Respiratory: Negative for cough and shortness of breath.   Cardiovascular: Negative for chest pain, palpitations and leg swelling.  Gastrointestinal: Negative for abdominal pain, constipation and heartburn.  Genitourinary: Negative for dysuria.  Musculoskeletal: Negative for back pain, joint pain and myalgias.  Skin: Negative.   Neurological: Negative for dizziness.  Psychiatric/Behavioral: The patient is not nervous/anxious.     Physical Exam  Constitutional: She appears well-developed and well-nourished. No distress.  Eyes: Conjunctivae are normal.  Neck: Neck supple. No JVD present. No thyromegaly present.  Cardiovascular: Normal rate, regular rhythm and intact distal pulses.   Respiratory: Effort normal and breath sounds normal. No respiratory distress. She has no wheezes.  GI: Soft. Bowel sounds are normal. She exhibits no distension. There is no tenderness.  Musculoskeletal: She exhibits no edema.  Able to move all extremities  Spends nearly all of her time in bed per her choice   Lymphadenopathy:    She has no cervical adenopathy.  Neurological: She is alert.  Skin: Skin is warm and dry. She is not diaphoretic.  Psychiatric: She has a normal mood and affect.    ASSESSMENT/ PLAN:  TODAY:   1. Hypertension:b/p 129/81 is stable; will continue lisinopril 5 mg daily; and lopressor  25 mg twice daily  Asa 325 mg daily    2. Diabetes:  hgb a1c is 5.9  She is presently stable her cbgs are good ; will continue will lower her lantus to 12 units every 12 hours  She is on a statin and ace and asa   3. Dyslipidemia: ldl is 48; is stable will continue lipitor 10 mg daily   4.  Alzheimer's disease: no change in status; is presently not  taking medications will not make changes will monitor her status.  Her current weight is 160 pounds and is stable; will monitor .   PREVIOUS PROBLEMS:   5. . Depression: is stable will continue colace and  miralax prn  daily and senna s 2 tabs daily   6. Osteoarthritis: is stable  pain is presently being managed; will continue tylenol 1 gm daily  7. Depression with psychosis: is presently stable is presently not taking medications; will monitor   8. Nephrolithiasis; outlet obstruction: is on long term macrobid daily  is followed by urology.   9. Weight loss: her current weight is 160 pounds; her weight in April 2018: 162 pounds. Will continue supplements per facility protocol.   Her health care maintenance  is up to date Will need cbc; cmp; and urine for micor-albumin   Time spent with patient  45  minutes >50% time spent counseling ( we did discuss her medications; she does not mind taking her medications; she is unable to fully understand information given; message left for RP); reviewing medical record; tests; labs; and developing future plan of care     MD is aware of resident's narcotic use and is in agreement with current plan of care. We will wean dosage as appropriate for resident     Ok Edwards NP [facility] Adult Medicine  Contact [phone] Monday through Friday 8am- 5pm  After hours call [phone]

## 2016-08-11 DIAGNOSIS — E784 Other hyperlipidemia: Secondary | ICD-10-CM | POA: Diagnosis not present

## 2016-08-11 DIAGNOSIS — E1165 Type 2 diabetes mellitus with hyperglycemia: Secondary | ICD-10-CM | POA: Diagnosis not present

## 2016-08-11 DIAGNOSIS — E169 Disorder of pancreatic internal secretion, unspecified: Secondary | ICD-10-CM | POA: Diagnosis not present

## 2016-08-11 DIAGNOSIS — E875 Hyperkalemia: Secondary | ICD-10-CM | POA: Diagnosis not present

## 2016-08-11 DIAGNOSIS — D649 Anemia, unspecified: Secondary | ICD-10-CM | POA: Diagnosis not present

## 2016-08-11 LAB — CBC AND DIFFERENTIAL
HCT: 41 (ref 36–46)
Hemoglobin: 13.8 (ref 12.0–16.0)
NEUTROS ABS: 4
PLATELETS: 273 (ref 150–399)
WBC: 10.8

## 2016-08-11 LAB — BASIC METABOLIC PANEL
BUN: 29 — AB (ref 4–21)
Creatinine: 1.2 — AB (ref 0.5–1.1)
GLUCOSE: 131
Potassium: 5.8 — AB (ref 3.4–5.3)
Sodium: 134 — AB (ref 137–147)

## 2016-08-11 LAB — HEPATIC FUNCTION PANEL
ALT: 9 (ref 7–35)
AST: 14 (ref 13–35)
Alkaline Phosphatase: 69 (ref 25–125)
BILIRUBIN, TOTAL: 0.2

## 2016-08-11 LAB — MICROALBUMIN, URINE: Microalb, Ur: 1.5

## 2016-09-01 ENCOUNTER — Other Ambulatory Visit: Payer: Self-pay

## 2016-09-01 MED ORDER — OXYCODONE-ACETAMINOPHEN 5-325 MG PO TABS: 1.0000 | ORAL_TABLET | Freq: Four times a day (QID) | ORAL | 0 refills | Status: DC | PRN

## 2016-09-01 NOTE — Telephone Encounter (Signed)
Prescription request was received from:  [facility]  [address] [place] [address]  PHONE: [phone]   Fax: [phone] 

## 2016-09-06 ENCOUNTER — Other Ambulatory Visit: Payer: Self-pay

## 2016-09-06 MED ORDER — OXYCODONE-ACETAMINOPHEN 5-325 MG PO TABS: 1.0000 | ORAL_TABLET | Freq: Four times a day (QID) | ORAL | 0 refills | Status: DC | PRN

## 2016-09-06 NOTE — Telephone Encounter (Signed)
RX faxed to [facility] @ [phone], phone number [phone] 

## 2016-09-13 ENCOUNTER — Non-Acute Institutional Stay (SKILLED_NURSING_FACILITY): Payer: Medicare Other | Admitting: Internal Medicine

## 2016-09-13 ENCOUNTER — Encounter: Payer: Self-pay | Admitting: Internal Medicine

## 2016-09-13 DIAGNOSIS — E785 Hyperlipidemia, unspecified: Secondary | ICD-10-CM

## 2016-09-13 DIAGNOSIS — E034 Atrophy of thyroid (acquired): Secondary | ICD-10-CM | POA: Diagnosis not present

## 2016-09-13 DIAGNOSIS — E119 Type 2 diabetes mellitus without complications: Secondary | ICD-10-CM

## 2016-09-13 DIAGNOSIS — F0280 Dementia in other diseases classified elsewhere without behavioral disturbance: Secondary | ICD-10-CM | POA: Diagnosis not present

## 2016-09-13 DIAGNOSIS — E1169 Type 2 diabetes mellitus with other specified complication: Secondary | ICD-10-CM | POA: Diagnosis not present

## 2016-09-13 DIAGNOSIS — G308 Other Alzheimer's disease: Secondary | ICD-10-CM

## 2016-09-13 DIAGNOSIS — Z794 Long term (current) use of insulin: Secondary | ICD-10-CM

## 2016-09-13 NOTE — Progress Notes (Signed)
Patient ID: Ana Newman, female   DOB: 06-Mar-1935, 81 y.o.   MRN: [id number]     DATE:  September 13, 2016  Location:   [facility] Room Number: [place] of Service: SNF (31)   Extended Emergency Contact Information Primary Emergency Contact: Smith,Christie Address: [address] [address] [place] Phone: [phone] Relation: Spouse Secondary Emergency Contact: Allen Norris [place] of [place] Phone: [phone] Mobile Phone: [phone] Relation: Son  Advanced Directive information Does Patient Have a Medical Advance Directive?: Yes, Type of Advance Directive: Out of facility DNR (pink MOST or yellow form), Pre-existing out of facility DNR order (yellow form or pink MOST form): Pink MOST form placed in chart (order not valid for inpatient use), Does patient want to make changes to medical advance directive?: No - Patient declined  Chief Complaint  Patient presents with  . Medical Management of Chronic Issues    1 month follow up    HPI:  81 yo female long term resident seen today for f/u. She has no concerns. No f/c. No falls. Appetite ok. Sleeps well. She is a poor historian due to dementia. Hx obtained from chart.   Hypertension - stable on lisinopril 5 mg daily; lopressor 25 mg twice daily. She takes ASA 325 mg daily    DM - A1c 5.9%  CBGs 100-120s; occasionally 80s. No low BS reactions.  Takes levemir 20 units in AM and 15 units in PM. She takes lipitor, lisinopril and ASA. Urine microalbumin/Creatinine ratio 73.1.  Dyslipidemia- LDL at goal on lipitor 10 mg daily. LDL 48   Alzheimer's dementia - stable. She does not take any meds for cognition. Wt 163 lbs (up 3 lbs since last month). She gets nutritional supplements per facility protocol  Osteoarthritis - pain stable on tylenol 1 gm daily  Depression with psychosis - mood stable without meds  Hx Nephrolithiasis with outlet obstruction - takes long  term macrobid daily; followed by urology.    Hypothyroidism - stable off medication. TSH 2.18  Past Medical History:  Diagnosis Date  . Acute pyelonephritis   . AKI (acute kidney injury) ([place])   . Alzheimer's dementia   . Alzheimer's disease   . Bladder outlet obstruction   . Depression   . Diarrhea   . Dyslipidemia   . Environmental and seasonal allergies   . Essential hypertension, benign   . Foley catheter in place   . GERD (gastroesophageal reflux disease)   . History of urinary tract infection   . Hypertension   . Hypothyroidism   . Insomnia   . Nephrolithiasis   . Osteoarthritis   . Reflux esophagitis   . Rickets, active   . Sepsis ([place])   . Type II or unspecified type diabetes mellitus without mention of complication, uncontrolled   . Unspecified constipation   . Unspecified psychosis   . Unspecified vitamin D deficiency     Past Surgical History:  Procedure Laterality Date  . ABDOMINAL HYSTERECTOMY    . CYSTOSCOPY W/ URETERAL STENT PLACEMENT Bilateral 05/18/2015   Procedure: CYSTOSCOPY WITH RETROGRADE PYELOGRAM/URETERAL STENT PLACEMENT;  Surgeon: Carolan Clines, MD;  [facility]: [facility];  Service: Urology;  Laterality: Bilateral;  . CYSTOSCOPY W/ URETERAL STENT REMOVAL Bilateral 09/12/2015   Procedure: CYSTOSCOPY WITH STENT REMOVAL;  Surgeon: Carolan Clines, MD;  [facility];  Service: Urology;  Laterality: Bilateral;  1 HOUR  . CYSTOSCOPY WITH RETROGRADE PYELOGRAM,  URETEROSCOPY AND STENT PLACEMENT Bilateral 07/18/2015   Procedure: CYSTOSCOPY REMOVE LEFT DOUBLE J STENT LEFT RETROGRADE PYELOGRAM, LEFT URETEROSCOPY, PYELOSCOPY, REPLACEMENT OF LEFT DOUBLE J STENT;  Surgeon: Carolan Clines, MD;  [facility];  Service: Urology;  Laterality: Bilateral;  . HOLMIUM LASER APPLICATION Bilateral 06/18/15   Procedure: HOLMIUM LASER OF LEFT LOWER URETERAL STONE, LEFT PYELOSCOPY, REPLACE LEFT DOUBLE J STENT REMOVE RIGHT DOUBLE J STENT RIGHT RETROGRADE PYELOGRAM,  REPLACEMENT RIGHT DOUBLE J STENT ;  Surgeon: Carolan Clines, MD;  [facility];  Service: Urology;  Laterality: Bilateral;  . KNEE ARTHROSCOPY Left 1997    Patient Care Team: Gildardo Cranker, DO as PCP - General (Internal Medicine) Nyoka Cowden Phylis Bougie, NP as Nurse Practitioner ([place]) [facility], [place] ([place])  Social History   Social History  . Marital status: Married    Spouse name: N/A  . Number of children: N/A  . Years of education: N/A   Occupational History  . Not on file.   Social History Main Topics  . Smoking status: Never Smoker  . Smokeless tobacco: Never Used  . Alcohol use No  . Drug use: No  . Sexual activity: Not on file   Other Topics Concern  . Not on file   Social History Narrative  . No narrative on file     reports that she has never smoked. She has never used smokeless tobacco. She reports that she does not drink alcohol or use drugs.  Family History  Problem Relation Age of Onset  . Family history unknown: Yes   Family Status  Relation Status  . Daughter Alive    Immunization History  Administered Date(s) Administered  . Hepatitis B 11/12/2015  . Influenza Whole 11/09/2012  . Influenza-Unspecified 11/12/2013, 11/11/2014, 11/12/2015  . PPD Test 10/10/2015, 10/17/2015  . Pneumococcal-Unspecified 01/03/2008    No Known Allergies  Medications: Patient's Medications  New Prescriptions   No medications on file  Previous Medications   ACETAMINOPHEN (TYLENOL) 325 MG TABLET    Take 650 mg by mouth every 6 (six) hours as needed for moderate pain.   ACETAMINOPHEN (TYLENOL) 500 MG TABLET    Take 1,000 mg by mouth daily. Pain management   ASPIRIN 325 MG EC TABLET    Take 325 mg by mouth daily.   ATORVASTATIN (LIPITOR) 10 MG TABLET    Take 10 mg by mouth daily at 6 PM.   BISACODYL (DULCOLAX) 5 MG EC TABLET    Take 10 mg by mouth daily as needed for moderate constipation.   CHOLECALCIFEROL (VITAMIN  D) 1000 UNITS TABLET    Take 2,000 Units by mouth daily.   DOCUSATE SODIUM (COLACE) 100 MG CAPSULE    Take 100 mg by mouth daily as needed for mild constipation.    INSULIN DETEMIR (LEVEMIR FLEXPEN) 100 UNIT/ML PEN    Inject 20 Units into the skin every morning. And 15 units subcutaneously in the evening   LISINOPRIL (PRINIVIL,ZESTRIL) 5 MG TABLET    Take 5 mg by mouth daily.    LORATADINE (CLARITIN) 10 MG TABLET    Take 10 mg by mouth daily as needed for allergies.    METOPROLOL TARTRATE (LOPRESSOR) 25 MG TABLET    Take 25 mg by mouth 2 (two) times daily. For HTN  Hold if B/P < 100/60 or HR < 60   NITROFURANTOIN, MACROCRYSTAL-MONOHYDRATE, (MACROBID) 100 MG CAPSULE    Take 1 capsule (100 mg total) by mouth at bedtime.   ONDANSETRON (ZOFRAN) 8  MG TABLET    Take 8 mg by mouth every 8 (eight) hours as needed for nausea or vomiting.   OXYCODONE-ACETAMINOPHEN (PERCOCET/ROXICET) 5-325 MG TABLET    Take 1 tablet by mouth every 6 (six) hours as needed for severe pain.   PHENAZOPYRIDINE (PYRIDIUM) 200 MG TABLET    Take 200 mg by mouth every 8 (eight) hours as needed for pain.   POLYETHYLENE GLYCOL (MIRALAX / GLYCOLAX) PACKET    Take 17 g by mouth daily as needed.   POLYVINYL ALCOHOL-POVIDONE (FRESHKOTE) 2.7-2 % SOLN    Apply 1 drop to eye 3 (three) times daily. To both eyes   PROMETHAZINE (PHENERGAN) 25 MG TABLET    Take 25 mg by mouth every 6 (six) hours as needed for nausea or vomiting.   SENNA-DOCUSATE (SENOKOT S) 8.6-50 MG PER TABLET    Take 2 tablets by mouth at bedtime.    SKIN PROTECTANTS, MISC. (CALAZIME SKIN PROTECTANT EX)    Apply 1 application topically 2 (two) times daily.  Modified Medications   No medications on file  Discontinued Medications   INSULIN GLARGINE (LANTUS) 100 UNIT/ML INJECTION    Inject 12 Units into the skin 2 (two) times daily. 12 units in the AM and 12 units at bedtime    Review of Systems  Unable to perform ROS: Dementia    Vitals:   09/13/16 1011  BP: 123/78    Pulse: 78  Resp: 18  Temp: 99 F (37.2 C)  SpO2: 98%  Weight: 163 lb 1.6 oz (74 kg)  Height: 4\' 11"  (1.499 m)   Body mass index is 32.94 kg/m.  Physical Exam  Constitutional: She appears well-developed.  Frail appearing; sitting up in bed in NAD  HENT:  Mouth/Throat: Oropharynx is clear and moist. No oropharyngeal exudate.  MMM; no oral thrush  Eyes: Pupils are equal, round, and reactive to light. No scleral icterus.  Neck: Neck supple. Carotid bruit is not present. No tracheal deviation present. No thyromegaly present.  Cardiovascular: Normal rate, regular rhythm and intact distal pulses.  Exam reveals no gallop and no friction rub.   Murmur (1/6 SEM) heard. No LE edema b/l. no calf TTP.   Pulmonary/Chest: Effort normal and breath sounds normal. No stridor. No respiratory distress. She has no wheezes. She has no rales.  Abdominal: Soft. Normal appearance and bowel sounds are normal. She exhibits distension. She exhibits no mass. There is no hepatomegaly. There is no tenderness. There is no rigidity, no rebound and no guarding. No hernia.  Musculoskeletal: She exhibits edema and tenderness.  Lymphadenopathy:    She has no cervical adenopathy.  Neurological: She is alert.  Skin: Skin is warm and dry. No rash noted.  Psychiatric: She has a normal mood and affect. Her behavior is normal. Thought content normal.     Labs reviewed: Nursing Home on 07/13/2016  Component Date Value Ref Range Status  . Triglycerides 05/14/2016 248* 40 - 160 Final  . Cholesterol 05/14/2016 136  0 - 200 Final  . HDL 05/14/2016 39  35 - 70 Final  . LDL Cholesterol 05/14/2016 48   Final  . Hemoglobin A1C 05/14/2016 5.9   Final  . TSH 05/14/2016 2.18  0.41 - 5.90 Final    No results found.   Assessment/Plan   ICD-10-CM   1. Diabetes mellitus, type II, insulin dependent (HCC) E11.9    Z79.4   2. Dyslipidemia associated with type 2 diabetes mellitus ([place]) E11.69    E78.5   3.  Alzheimer's  disease of other onset without behavioral disturbance G30.8    F02.80   4. Hypothyroidism due to acquired atrophy of thyroid E03.4      Check A1c and lipid panel  Cont current meds as ordered  PT/OT/ST as indicated  Will follow  Janie Strothman S. Perlie Gold  [facility] and Adult Medicine [address] [place], [place] [address] [phone] Cell (Monday-Friday 8 AM - 5 PM) [phone] After 5 PM and follow prompts

## 2016-09-14 DIAGNOSIS — R262 Difficulty in walking, not elsewhere classified: Secondary | ICD-10-CM | POA: Diagnosis not present

## 2016-09-14 DIAGNOSIS — E119 Type 2 diabetes mellitus without complications: Secondary | ICD-10-CM | POA: Diagnosis not present

## 2016-09-14 DIAGNOSIS — B351 Tinea unguium: Secondary | ICD-10-CM | POA: Diagnosis not present

## 2016-09-14 DIAGNOSIS — L603 Nail dystrophy: Secondary | ICD-10-CM | POA: Diagnosis not present

## 2016-09-14 DIAGNOSIS — E1140 Type 2 diabetes mellitus with diabetic neuropathy, unspecified: Secondary | ICD-10-CM | POA: Diagnosis not present

## 2016-09-14 DIAGNOSIS — E785 Hyperlipidemia, unspecified: Secondary | ICD-10-CM | POA: Diagnosis not present

## 2016-09-14 LAB — LIPID PANEL
CHOLESTEROL: 142 (ref 0–200)
HDL: 36 (ref 35–70)
LDL Cholesterol: 60
TRIGLYCERIDES: 228 — AB (ref 40–160)

## 2016-09-14 LAB — HEMOGLOBIN A1C: HEMOGLOBIN A1C: 6

## 2016-09-27 ENCOUNTER — Encounter: Payer: Self-pay | Admitting: Adult Health

## 2016-09-27 ENCOUNTER — Non-Acute Institutional Stay (SKILLED_NURSING_FACILITY): Payer: Medicare Other | Admitting: Adult Health

## 2016-09-27 DIAGNOSIS — K5909 Other constipation: Secondary | ICD-10-CM | POA: Diagnosis not present

## 2016-09-27 DIAGNOSIS — R110 Nausea: Secondary | ICD-10-CM

## 2016-09-27 DIAGNOSIS — R140 Abdominal distension (gaseous): Secondary | ICD-10-CM | POA: Diagnosis not present

## 2016-09-27 NOTE — Progress Notes (Signed)
Location:   [place] Room Number: 115 B Place of Service:  SNF (31)   CODE STATUS: Full Code  No Known Allergies  Chief Complaint  Patient presents with  . Acute Visit    Nausea    HPI:  She has been complaining of nausea for "2 weeks". She denies vomiting; and is complaining of constipation stating that it has been >5 days since her last bowel movement. Staff reports that her appetite is worse. She is willing to have a kub done. She declined a rectal exam.    Past Medical History:  Diagnosis Date  . Acute pyelonephritis   . AKI (acute kidney injury) ([place])   . Alzheimer's dementia   . Alzheimer's disease   . Bladder outlet obstruction   . Depression   . Diarrhea   . Dyslipidemia   . Environmental and seasonal allergies   . Essential hypertension, benign   . Foley catheter in place   . GERD (gastroesophageal reflux disease)   . History of urinary tract infection   . Hypertension   . Hypothyroidism   . Insomnia   . Nephrolithiasis   . Osteoarthritis   . Reflux esophagitis   . Rickets, active   . Sepsis ([place])   . Type II or unspecified type diabetes mellitus without mention of complication, uncontrolled   . Unspecified constipation   . Unspecified psychosis   . Unspecified vitamin D deficiency     Past Surgical History:  Procedure Laterality Date  . ABDOMINAL HYSTERECTOMY    . CYSTOSCOPY W/ URETERAL STENT PLACEMENT Bilateral 05/18/2015   Procedure: CYSTOSCOPY WITH RETROGRADE PYELOGRAM/URETERAL STENT PLACEMENT;  Surgeon: Carolan Clines, MD;  Location: [facility];  Service: Urology;  Laterality: Bilateral;  . CYSTOSCOPY W/ URETERAL STENT REMOVAL Bilateral 09/12/2015   Procedure: CYSTOSCOPY WITH STENT REMOVAL;  Surgeon: Carolan Clines, MD;  Location: [facility];  Service: Urology;  Laterality: Bilateral;  1 HOUR  . CYSTOSCOPY WITH RETROGRADE PYELOGRAM, URETEROSCOPY AND STENT PLACEMENT Bilateral 07/18/2015   Procedure: CYSTOSCOPY REMOVE LEFT DOUBLE J  STENT LEFT RETROGRADE PYELOGRAM, LEFT URETEROSCOPY, PYELOSCOPY, REPLACEMENT OF LEFT DOUBLE J STENT;  Surgeon: Carolan Clines, MD;  Location: [facility];  Service: Urology;  Laterality: Bilateral;  . HOLMIUM LASER APPLICATION Bilateral 05/28/621   Procedure: HOLMIUM LASER OF LEFT LOWER URETERAL STONE, LEFT PYELOSCOPY, REPLACE LEFT DOUBLE J STENT REMOVE RIGHT DOUBLE J STENT RIGHT RETROGRADE PYELOGRAM, REPLACEMENT RIGHT DOUBLE J STENT ;  Surgeon: Carolan Clines, MD;  Location: [facility];  Service: Urology;  Laterality: Bilateral;  . KNEE ARTHROSCOPY Left 1997    Social History   Social History  . Marital status: Married    Spouse name: N/A  . Number of children: N/A  . Years of education: N/A   Occupational History  . Not on file.   Social History Main Topics  . Smoking status: Never Smoker  . Smokeless tobacco: Never Used  . Alcohol use No  . Drug use: No  . Sexual activity: Not on file   Other Topics Concern  . Not on file   Social History Narrative  . No narrative on file   Family History  Problem Relation Age of Onset  . Family history unknown: Yes      VITAL SIGNS BP 116/60   Pulse 78   Temp 98.2 F (36.8 C)   Resp 18   Ht 4\' 11"  (1.499 m)   Wt 163 lb 1.6 oz (74 kg)   SpO2 98%   BMI 32.94 kg/m  Patient's Medications  New Prescriptions   No medications on file  Previous Medications   ACETAMINOPHEN (TYLENOL) 325 MG TABLET    Take 650 mg by mouth every 6 (six) hours as needed for moderate pain.   ACETAMINOPHEN (TYLENOL) 500 MG TABLET    Take 1,000 mg by mouth daily. Pain management   ASPIRIN 325 MG EC TABLET    Take 325 mg by mouth daily.   ATORVASTATIN (LIPITOR) 10 MG TABLET    Take 10 mg by mouth daily at 6 PM.   BISACODYL (DULCOLAX) 5 MG EC TABLET    Take 10 mg by mouth daily as needed for moderate constipation.   CHOLECALCIFEROL (VITAMIN D) 1000 UNITS TABLET    Take 2,000 Units by mouth daily.   DOCUSATE SODIUM (COLACE) 100 MG CAPSULE    Take 100 mg by  mouth daily as needed for mild constipation.    INSULIN DETEMIR (LEVEMIR FLEXPEN) 100 UNIT/ML PEN    Inject 20 Units into the skin every morning. And 15 units subcutaneously in the evening   LISINOPRIL (PRINIVIL,ZESTRIL) 5 MG TABLET    Take 5 mg by mouth daily.    LORATADINE (CLARITIN) 10 MG TABLET    Take 10 mg by mouth daily as needed for allergies.    METOPROLOL TARTRATE (LOPRESSOR) 25 MG TABLET    Take 25 mg by mouth 2 (two) times daily. For HTN  Hold if B/P < 100/60 or HR < 60   NITROFURANTOIN, MACROCRYSTAL-MONOHYDRATE, (MACROBID) 100 MG CAPSULE    Take 1 capsule (100 mg total) by mouth at bedtime.   ONDANSETRON (ZOFRAN) 4 MG TABLET    Take 4 mg by mouth every 6 (six) hours as needed for nausea or vomiting.    OXYCODONE-ACETAMINOPHEN (PERCOCET/ROXICET) 5-325 MG TABLET    Take 1 tablet by mouth every 6 (six) hours as needed for severe pain.   PHENAZOPYRIDINE (PYRIDIUM) 200 MG TABLET    Take 200 mg by mouth every 8 (eight) hours as needed for pain.   POLYETHYLENE GLYCOL (MIRALAX / GLYCOLAX) PACKET    Take 17 g by mouth daily as needed.   POLYVINYL ALCOHOL-POVIDONE (FRESHKOTE) 2.7-2 % SOLN    Apply 1 drop to eye 3 (three) times daily. To both eyes   SENNA-DOCUSATE (SENOKOT S) 8.6-50 MG PER TABLET    Take 2 tablets by mouth at bedtime.   Modified Medications   No medications on file  Discontinued Medications   PROMETHAZINE (PHENERGAN) 25 MG TABLET    Take 25 mg by mouth every 6 (six) hours as needed for nausea or vomiting.   SKIN PROTECTANTS, MISC. (CALAZIME SKIN PROTECTANT EX)    Apply 1 application topically 2 (two) times daily.     SIGNIFICANT DIAGNOSTIC EXAMS  05-18-15: ct of abdomen and pelvis: 1. Motion and hardware degraded images. 2. Moderate left-sided hydroureteronephrosis secondary to a distal left ureteric 11 x 13 mm calculus. Decreased left-sided renal function. 3. Large burden bilateral renal collecting system stones, including stones within both extrarenal pelves. 4. Cirrhosis  with small volume ascites. 5. Proctitis, likely related to C. difficile colitis, given the clinical history. 6. Bladder wall irregularity which could relate to cystitis. Bladder diverticulum, suggesting a component of outlet obstruction. 7. Skin irregularity superficial to the coccyx. Recommend physical exam correlation to exclude decubitus ulcer. No gross osseous destruction.  03-31-16: chest x-ray: no acute cardiopulmonary disease  03-31-16: kub: mild constipation   NO NEW EXAMS   LABS REVIEWED: PREVIOUS  11-05-15: wbc 8.3; hgb  12.5; hct 36.8; mcv 91.1; plt 292; glucose 113; bun 15.9; creat 0.86; k+ 4.9; na++ 140; liver normal albumin 4.1  03-31-16: wbc 18.7; hgb 13.9; hct 41.8; mcv 95.7; plt 310; glucose 160; bun 17.2; creat 0.74; k+ 4.1; na++ 138; liver normal albumin 4.2 04-01-16: urine culture: providencia stuartii 04-02-16: glucose 110; bun 30.5; creat 0.99; k+ 4.0; na++ 135; ca 9.3; liver normal albumin 3.5  05-14-16: tsh 2.18; hgb a1c 5.9; chol 136; ldl 48; trig 248; hdl 39    NO NEW LABS    Review of Systems  Constitutional: Negative for malaise/fatigue.  HENT: Negative for congestion.   Eyes: Negative.   Respiratory: Negative for cough and shortness of breath.   Cardiovascular: Negative for chest pain, palpitations and leg swelling.  Gastrointestinal: Positive for constipation and nausea. Negative for abdominal pain, diarrhea and heartburn.  Genitourinary: Negative for dysuria.  Musculoskeletal: Negative for back pain, joint pain and myalgias.  Skin: Negative.   Neurological: Negative for dizziness.  Psychiatric/Behavioral: The patient is not nervous/anxious.    Physical Exam  Constitutional: She appears well-developed and well-nourished. No distress.  Eyes: Conjunctivae are normal.  Neck: Neck supple. No JVD present. No thyromegaly present.  Cardiovascular: Normal rate, regular rhythm and intact distal pulses.   Respiratory: Effort normal and breath sounds normal. No  respiratory distress. She has no wheezes.  GI: Soft. Bowel sounds are normal. She exhibits distension. There is no tenderness.  Bowel sounds hypoactive   Musculoskeletal: She exhibits no edema.  Able to move all extremities  Spends nearly all of her time in bed per her choice   Lymphadenopathy:    She has no cervical adenopathy.  Neurological: She is alert.  Skin: Skin is warm and dry. She is not diaphoretic.  Psychiatric: She has a normal mood and affect.    ASSESSMENT/ PLAN:  TODAY:  1. Nausea 2. Constipation:   Will stop: colace senna dulcolax tabs and miralax per her request; she says that they make her sick   Will give her a dulcolax supp now  Will begin linzess 72 mcg daily  Will get cbc; cmp; kub   MD is aware of resident's narcotic use and is in agreement with current plan of care. We will attempt to wean resident as apropriate     Ok Edwards NP [facility]  Contact [phone] Monday through Friday 8am- 5pm  After hours call [phone]

## 2016-09-28 DIAGNOSIS — R69 Illness, unspecified: Secondary | ICD-10-CM | POA: Diagnosis not present

## 2016-09-28 DIAGNOSIS — D649 Anemia, unspecified: Secondary | ICD-10-CM | POA: Diagnosis not present

## 2016-09-28 DIAGNOSIS — Z79899 Other long term (current) drug therapy: Secondary | ICD-10-CM | POA: Diagnosis not present

## 2016-09-28 LAB — HEPATIC FUNCTION PANEL
ALK PHOS: 71 (ref 25–125)
ALT: 9 (ref 7–35)
AST: 13 (ref 13–35)
BILIRUBIN, TOTAL: 0.2

## 2016-09-28 LAB — BASIC METABOLIC PANEL
BUN: 36 — AB (ref 4–21)
Creatinine: 0.9 (ref 0.5–1.1)
GLUCOSE: 208
Potassium: 5.8 — AB (ref 3.4–5.3)
SODIUM: 139 (ref 137–147)

## 2016-09-28 LAB — CBC AND DIFFERENTIAL
HEMATOCRIT: 35 — AB (ref 36–46)
Hemoglobin: 11.5 — AB (ref 12.0–16.0)
NEUTROS ABS: 4
PLATELETS: 355 (ref 150–399)
WBC: 7.5

## 2016-10-06 ENCOUNTER — Other Ambulatory Visit: Payer: Self-pay

## 2016-10-06 MED ORDER — OXYCODONE-ACETAMINOPHEN 5-325 MG PO TABS: 1.0000 | ORAL_TABLET | Freq: Four times a day (QID) | ORAL | 0 refills | Status: DC | PRN

## 2016-10-06 NOTE — Telephone Encounter (Signed)
RX faxed to [facility] @ [phone], phone number [phone] 

## 2016-10-08 ENCOUNTER — Non-Acute Institutional Stay (SKILLED_NURSING_FACILITY): Payer: Medicare Other | Admitting: Adult Health

## 2016-10-08 ENCOUNTER — Encounter: Payer: Self-pay | Admitting: Adult Health

## 2016-10-08 ENCOUNTER — Other Ambulatory Visit: Payer: Self-pay

## 2016-10-08 DIAGNOSIS — I152 Hypertension secondary to endocrine disorders: Secondary | ICD-10-CM

## 2016-10-08 DIAGNOSIS — E1159 Type 2 diabetes mellitus with other circulatory complications: Secondary | ICD-10-CM

## 2016-10-08 DIAGNOSIS — R110 Nausea: Secondary | ICD-10-CM | POA: Insufficient documentation

## 2016-10-08 DIAGNOSIS — I10 Essential (primary) hypertension: Secondary | ICD-10-CM | POA: Diagnosis not present

## 2016-10-08 DIAGNOSIS — E785 Hyperlipidemia, unspecified: Secondary | ICD-10-CM

## 2016-10-08 DIAGNOSIS — Z794 Long term (current) use of insulin: Secondary | ICD-10-CM | POA: Diagnosis not present

## 2016-10-08 DIAGNOSIS — E1169 Type 2 diabetes mellitus with other specified complication: Secondary | ICD-10-CM

## 2016-10-08 DIAGNOSIS — E119 Type 2 diabetes mellitus without complications: Secondary | ICD-10-CM | POA: Diagnosis not present

## 2016-10-08 DIAGNOSIS — K5909 Other constipation: Secondary | ICD-10-CM | POA: Insufficient documentation

## 2016-10-08 MED ORDER — OXYCODONE-ACETAMINOPHEN 5-325 MG PO TABS: 1.0000 | ORAL_TABLET | Freq: Four times a day (QID) | ORAL | 0 refills | Status: DC | PRN

## 2016-10-08 NOTE — Telephone Encounter (Signed)
RX faxed to [facility] @ [phone], phone number [phone] 

## 2016-10-08 NOTE — Progress Notes (Signed)
Location:   [place] Room Number: 115 B Place of Service:  SNF (31)   CODE STATUS: Full Code  No Known Allergies  Chief Complaint  Patient presents with  . Medical Management of Chronic Issues    DM follow up    HPI:  She is a 81 year old being seen for the management of her diabetes. Her cbg max is 210; low is 73. She denies feeling weak, sweating, dizzy. There are no nursing concerns at this time. She denied nausea or constipation.  She is on levemir 20 units in the AM and 15 units in the PM. She is on an ace; statin and asa.   Past Medical History:  Diagnosis Date  . Acute pyelonephritis   . AKI (acute kidney injury) ([place])   . Alzheimer's dementia   . Alzheimer's disease   . Bladder outlet obstruction   . Depression   . Diarrhea   . Dyslipidemia   . Environmental and seasonal allergies   . Essential hypertension, benign   . Foley catheter in place   . GERD (gastroesophageal reflux disease)   . History of urinary tract infection   . Hypertension   . Hypothyroidism   . Insomnia   . Nephrolithiasis   . Osteoarthritis   . Reflux esophagitis   . Rickets, active   . Sepsis ([place])   . Type II or unspecified type diabetes mellitus without mention of complication, uncontrolled   . Unspecified constipation   . Unspecified psychosis   . Unspecified vitamin D deficiency     Past Surgical History:  Procedure Laterality Date  . ABDOMINAL HYSTERECTOMY    . CYSTOSCOPY W/ URETERAL STENT PLACEMENT Bilateral 05/18/2015   Procedure: CYSTOSCOPY WITH RETROGRADE PYELOGRAM/URETERAL STENT PLACEMENT;  Surgeon: Carolan Clines, MD;  [facility];  Service: Urology;  Laterality: Bilateral;  . CYSTOSCOPY W/ URETERAL STENT REMOVAL Bilateral 09/12/2015   Procedure: CYSTOSCOPY WITH STENT REMOVAL;  Surgeon: Carolan Clines, MD;  [facility]: [facility];  Service: Urology;  Laterality: Bilateral;  1 HOUR  . CYSTOSCOPY WITH RETROGRADE PYELOGRAM, URETEROSCOPY AND STENT PLACEMENT  Bilateral 07/18/2015   Procedure: CYSTOSCOPY REMOVE LEFT DOUBLE J STENT LEFT RETROGRADE PYELOGRAM, LEFT URETEROSCOPY, PYELOSCOPY, REPLACEMENT OF LEFT DOUBLE J STENT;  Surgeon: Carolan Clines, MD;  Location[facility];  Service: Urology;  Laterality: Bilateral;  . HOLMIUM LASER APPLICATION Bilateral 2/97/9892   Procedure: HOLMIUM LASER OF LEFT LOWER URETERAL STONE, LEFT PYELOSCOPY, REPLACE LEFT DOUBLE J STENT REMOVE RIGHT DOUBLE J STENT RIGHT RETROGRADE PYELOGRAM, REPLACEMENT RIGHT DOUBLE J STENT ;  Surgeon: Carolan Clines, MD;  Location: [facility];  Service: Urology;  Laterality: Bilateral;  . KNEE ARTHROSCOPY Left 1997    Social History   Social History  . Marital status: Married    Spouse name: N/A  . Number of children: N/A  . Years of education: N/A   Occupational History  . Not on file.   Social History Main Topics  . Smoking status: Never Smoker  . Smokeless tobacco: Never Used  . Alcohol use No  . Drug use: No  . Sexual activity: Not on file   Other Topics Concern  . Not on file   Social History Narrative  . No narrative on file   Family History  Problem Relation Age of Onset  . Family history unknown: Yes      VITAL SIGNS BP 112/66   Pulse 70   Temp 97.7 F (36.5 C)   Resp 18   Ht 4\' 11"  (  1.499 m)   Wt 163 lb 1.6 oz (74 kg)   SpO2 97%   BMI 32.94 kg/m    Patient's Medications  New Prescriptions   No medications on file  Previous Medications   ACETAMINOPHEN (TYLENOL) 325 MG TABLET    Take 650 mg by mouth every 6 (six) hours as needed for moderate pain.   ACETAMINOPHEN (TYLENOL) 500 MG TABLET    Take 1,000 mg by mouth daily. Pain management   ASPIRIN 325 MG EC TABLET    Take 325 mg by mouth daily.   ATORVASTATIN (LIPITOR) 10 MG TABLET    Take 10 mg by mouth daily at 6 PM.   CHOLECALCIFEROL (VITAMIN D) 1000 UNITS TABLET    Take 2,000 Units by mouth daily.   INSULIN DETEMIR (LEVEMIR FLEXPEN) 100 UNIT/ML PEN    Inject 20 Units into the skin every  morning. And 15 units subcutaneously in the evening   LINACLOTIDE (LINZESS) 72 MCG CAPSULE    Take 72 mcg by mouth daily.   LISINOPRIL (PRINIVIL,ZESTRIL) 5 MG TABLET    Take 5 mg by mouth daily.    LORATADINE (CLARITIN) 10 MG TABLET    Take 10 mg by mouth daily as needed for allergies.    METOPROLOL TARTRATE (LOPRESSOR) 25 MG TABLET    Take 25 mg by mouth 2 (two) times daily. For HTN  Hold if B/P < 100/60 or HR < 60   NITROFURANTOIN, MACROCRYSTAL-MONOHYDRATE, (MACROBID) 100 MG CAPSULE    Take 1 capsule (100 mg total) by mouth at bedtime.   ONDANSETRON (ZOFRAN) 4 MG TABLET    Take 4 mg by mouth every 6 (six) hours as needed for nausea or vomiting.    OXYCODONE-ACETAMINOPHEN (PERCOCET/ROXICET) 5-325 MG TABLET    Take 1 tablet by mouth every 6 (six) hours as needed for severe pain.   PHENAZOPYRIDINE (PYRIDIUM) 200 MG TABLET    Take 200 mg by mouth every 8 (eight) hours as needed for pain.   POLYVINYL ALCOHOL-POVIDONE (FRESHKOTE) 2.7-2 % SOLN    Apply 1 drop to eye 3 (three) times daily. To both eyes  Modified Medications   No medications on file  Discontinued Medications   BISACODYL (DULCOLAX) 5 MG EC TABLET    Take 10 mg by mouth daily as needed for moderate constipation.   DOCUSATE SODIUM (COLACE) 100 MG CAPSULE    Take 100 mg by mouth daily as needed for mild constipation.    POLYETHYLENE GLYCOL (MIRALAX / GLYCOLAX) PACKET    Take 17 g by mouth daily as needed.   SENNA-DOCUSATE (SENOKOT S) 8.6-50 MG PER TABLET    Take 2 tablets by mouth at bedtime.      SIGNIFICANT DIAGNOSTIC EXAMS  PREVIOUS   05-18-15: ct of abdomen and pelvis: 1. Motion and hardware degraded images. 2. Moderate left-sided hydroureteronephrosis secondary to a distal left ureteric 11 x 13 mm calculus. Decreased left-sided renal function. 3. Large burden bilateral renal collecting system stones, including stones within both extrarenal pelves. 4. Cirrhosis with small volume ascites. 5. Proctitis, likely related to C.  difficile colitis, given the clinical history. 6. Bladder wall irregularity which could relate to cystitis. Bladder diverticulum, suggesting a component of outlet obstruction. 7. Skin irregularity superficial to the coccyx. Recommend physical exam correlation to exclude decubitus ulcer. No gross osseous destruction.  03-31-16: chest x-ray: no acute cardiopulmonary disease  03-31-16: kub: mild constipation   TODAY:   09-27-16: kUB: no acute abdominal findings    LABS REVIEWED: PREVIOUS  11-05-15: wbc 8.3; hgb 12.5; hct 36.8; mcv 91.1; plt 292; glucose 113; bun 15.9; creat 0.86; k+ 4.9; na++ 140; liver normal albumin 4.1  03-31-16: wbc 18.7; hgb 13.9; hct 41.8; mcv 95.7; plt 310; glucose 160; bun 17.2; creat 0.74; k+ 4.1; na++ 138; liver normal albumin 4.2 04-01-16: urine culture: providencia stuartii 04-02-16: glucose 110; bun 30.5; creat 0.99; k+ 4.0; na++ 135; ca 9.3; liver normal albumin 3.5  05-14-16: tsh 2.18; hgb a1c 5.9; chol 136; ldl 48; trig 248; hdl 39   TODAY:   08-11-16: urine micro-albumin 1.5  09-14-16: chol 142; ldl 60; trig 228; hdl 36 hgb a1c 6.0  09-28-16: wbc 7.5; hgb 11.5; hct 35.1; mcv 102.4; plt 355 glucose 208; bun 36.1; creat 0.92; k+ 5.8; na++ 139; ca 9.5; liver normal albumin 3.5     Review of Systems  Constitutional: Negative for malaise/fatigue.  HENT: Negative for congestion.   Eyes: Negative.   Respiratory: Negative for cough and shortness of breath.   Cardiovascular: Negative for chest pain, palpitations and leg swelling.  Gastrointestinal: Negative for abdominal pain, constipation, diarrhea, heartburn and nausea.  Genitourinary: Negative for dysuria.  Musculoskeletal: Negative for back pain, joint pain and myalgias.  Skin: Negative.   Neurological: Negative for dizziness.  Psychiatric/Behavioral: The patient is not nervous/anxious.    Physical Exam  Constitutional: She appears well-developed and well-nourished. No distress.  Eyes: Conjunctivae are normal.    Neck: Neck supple. No JVD present. No thyromegaly present.  Cardiovascular: Normal rate, regular rhythm and intact distal pulses.   Respiratory: Effort normal and breath sounds normal. No respiratory distress. She has no wheezes.  GI: Soft. Bowel sounds are normal. She exhibits no distension. There is no tenderness.  Musculoskeletal: She exhibits no edema.  Able to move all extremities  Spends nearly all of her time in bed per her choice   Lymphadenopathy:    She has no cervical adenopathy.  Neurological: She is alert.  Skin: Skin is warm and dry. She is not diaphoretic.  Psychiatric: She has a normal mood and affect.    ASSESSMENT/ PLAN:  1. Hypertension:b/p 112/ 66: is stable; will continue lisinopril 5 mg daily; and lopressor 25 mg twice daily  Asa 325 mg daily    2. Diabetes:  hgb a1c is 6.0  She is presently stable her cbgs are good ; will continue will lower her levemir to 12 units twice daily   She is on a statin and ace and asa  Micro albumin 1.5    3. Dyslipidemia: ldl is 60; is stable will continue lipitor 10 mg daily her trig are elevated at 228; will start her on fish oil 1 gm daily     MD is aware of resident's narcotic use and is in agreement with current plan of care. We will attempt to wean resident as apropriate   Ok Edwards NP [facility]  Contact [phone] Monday through Friday 8am- 5pm  After hours call [phone]

## 2016-10-13 ENCOUNTER — Non-Acute Institutional Stay (SKILLED_NURSING_FACILITY): Payer: Medicare Other | Admitting: Adult Health

## 2016-10-13 ENCOUNTER — Encounter: Payer: Self-pay | Admitting: Adult Health

## 2016-10-13 DIAGNOSIS — N320 Bladder-neck obstruction: Secondary | ICD-10-CM

## 2016-10-13 DIAGNOSIS — M150 Primary generalized (osteo)arthritis: Secondary | ICD-10-CM

## 2016-10-13 DIAGNOSIS — F329 Major depressive disorder, single episode, unspecified: Secondary | ICD-10-CM

## 2016-10-13 DIAGNOSIS — R634 Abnormal weight loss: Secondary | ICD-10-CM | POA: Diagnosis not present

## 2016-10-13 DIAGNOSIS — F32A Depression, unspecified: Secondary | ICD-10-CM

## 2016-10-13 DIAGNOSIS — M159 Polyosteoarthritis, unspecified: Secondary | ICD-10-CM

## 2016-10-13 NOTE — Progress Notes (Signed)
Location:   [facility] Room Number: 115 B Place of Service:  SNF (31)   CODE STATUS: Full Code  No Known Allergies  Chief Complaint  Patient presents with  . Medical Management of Chronic Issues    Depression; osteoarthritis; nephrolithiasis, weight loss      HPI:  She is a 81 year old long term resident of this facility being seen for the management of her chronic illnesses: depression; osteoarthritis; nephrolithiasis; weight loss. She does spend all her time in bed per her choice. She is not complaining of back pain; depressive feelings; or change in appetite. There are no nursing concerns at this time.    Past Medical History:  Diagnosis Date  . Acute pyelonephritis   . AKI (acute kidney injury) ([place])   . Alzheimer's dementia   . Alzheimer's disease   . Bladder outlet obstruction   . Depression   . Diarrhea   . Dyslipidemia   . Environmental and seasonal allergies   . Essential hypertension, benign   . Foley catheter in place   . GERD (gastroesophageal reflux disease)   . History of urinary tract infection   . Hypertension   . Hypothyroidism   . Insomnia   . Nephrolithiasis   . Osteoarthritis   . Reflux esophagitis   . Rickets, active   . Sepsis ([place])   . Type II or unspecified type diabetes mellitus without mention of complication, uncontrolled   . Unspecified constipation   . Unspecified psychosis   . Unspecified vitamin D deficiency     Past Surgical History:  Procedure Laterality Date  . ABDOMINAL HYSTERECTOMY    . CYSTOSCOPY W/ URETERAL STENT PLACEMENT Bilateral 05/18/2015   Procedure: CYSTOSCOPY WITH RETROGRADE PYELOGRAM/URETERAL STENT PLACEMENT;  Surgeon: Carolan Clines, MD;  Location: [facility];  Service: Urology;  Laterality: Bilateral;  . CYSTOSCOPY W/ URETERAL STENT REMOVAL Bilateral 09/12/2015   Procedure: CYSTOSCOPY WITH STENT REMOVAL;  Surgeon: Carolan Clines, MD;  Location: [facility];  Service: Urology;  Laterality: Bilateral;  1  HOUR  . CYSTOSCOPY WITH RETROGRADE PYELOGRAM, URETEROSCOPY AND STENT PLACEMENT Bilateral 07/18/2015   Procedure: CYSTOSCOPY REMOVE LEFT DOUBLE J STENT LEFT RETROGRADE PYELOGRAM, LEFT URETEROSCOPY, PYELOSCOPY, REPLACEMENT OF LEFT DOUBLE J STENT;  Surgeon: Carolan Clines, MD;  Location: [facility];  Service: Urology;  Laterality: Bilateral;  . HOLMIUM LASER APPLICATION Bilateral 0/62/6948   Procedure: HOLMIUM LASER OF LEFT LOWER URETERAL STONE, LEFT PYELOSCOPY, REPLACE LEFT DOUBLE J STENT REMOVE RIGHT DOUBLE J STENT RIGHT RETROGRADE PYELOGRAM, REPLACEMENT RIGHT DOUBLE J STENT ;  Surgeon: Carolan Clines, MD;  Location: [facility];  Service: Urology;  Laterality: Bilateral;  . KNEE ARTHROSCOPY Left 1997    Social History   Social History  . Marital status: Married    Spouse name: N/A  . Number of children: N/A  . Years of education: N/A   Occupational History  . Not on file.   Social History Main Topics  . Smoking status: Never Smoker  . Smokeless tobacco: Never Used  . Alcohol use No  . Drug use: No  . Sexual activity: Not on file   Other Topics Concern  . Not on file   Social History Narrative  . No narrative on file   Family History  Problem Relation Age of Onset  . Family history unknown: Yes      VITAL SIGNS BP 133/78   Pulse 88   Temp 97.7 F (36.5 C)   Resp 18   Ht 4\' 11"  (1.499 m)  Wt 156 lb 14.4 oz (71.2 kg)   SpO2 97%   BMI 31.69 kg/m   Patient's Medications  New Prescriptions   No medications on file  Previous Medications   ACETAMINOPHEN (TYLENOL) 325 MG TABLET    Take 650 mg by mouth every 6 (six) hours as needed for moderate pain.   ACETAMINOPHEN (TYLENOL) 500 MG TABLET    Take 1,000 mg by mouth daily. Pain management   ASPIRIN 325 MG EC TABLET    Take 325 mg by mouth daily.   ATORVASTATIN (LIPITOR) 10 MG TABLET    Take 10 mg by mouth daily at 6 PM.   CHOLECALCIFEROL (VITAMIN D) 1000 UNITS TABLET    Take 2,000 Units by mouth daily.   INSULIN  DETEMIR (LEVEMIR FLEXPEN) 100 UNIT/ML PEN    Inject 12 Units into the skin every morning. And 12 units subcutaneously in the evening   LINACLOTIDE (LINZESS) 72 MCG CAPSULE    Take 72 mcg by mouth daily.   LISINOPRIL (PRINIVIL,ZESTRIL) 5 MG TABLET    Take 5 mg by mouth daily.    LORATADINE (CLARITIN) 10 MG TABLET    Take 10 mg by mouth daily as needed for allergies.    METOPROLOL TARTRATE (LOPRESSOR) 25 MG TABLET    Take 25 mg by mouth 2 (two) times daily. For HTN  Hold if B/P < 100/60 or HR < 60   NITROFURANTOIN, MACROCRYSTAL-MONOHYDRATE, (MACROBID) 100 MG CAPSULE    Take 1 capsule (100 mg total) by mouth at bedtime.   OMEGA-3 FATTY ACIDS (FISH OIL OMEGA-3 PO)    Give 1 capsule by mouth every morning   ONDANSETRON (ZOFRAN) 4 MG TABLET    Take 4 mg by mouth every 6 (six) hours as needed for nausea or vomiting.    OXYCODONE-ACETAMINOPHEN (PERCOCET/ROXICET) 5-325 MG TABLET    Take 1 tablet by mouth every 6 (six) hours as needed for severe pain.   PHENAZOPYRIDINE (PYRIDIUM) 200 MG TABLET    Take 200 mg by mouth every 8 (eight) hours as needed for pain.   POLYVINYL ALCOHOL-POVIDONE (FRESHKOTE) 2.7-2 % SOLN    Apply 1 drop to eye 3 (three) times daily. To both eyes  Modified Medications   No medications on file  Discontinued Medications   No medications on file     SIGNIFICANT DIAGNOSTIC EXAMS  PREVIOUS   05-18-15: ct of abdomen and pelvis: 1. Motion and hardware degraded images. 2. Moderate left-sided hydroureteronephrosis secondary to a distal left ureteric 11 x 13 mm calculus. Decreased left-sided renal function. 3. Large burden bilateral renal collecting system stones, including stones within both extrarenal pelves. 4. Cirrhosis with small volume ascites. 5. Proctitis, likely related to C. difficile colitis, given the clinical history. 6. Bladder wall irregularity which could relate to cystitis. Bladder diverticulum, suggesting a component of outlet obstruction. 7. Skin irregularity  superficial to the coccyx. Recommend physical exam correlation to exclude decubitus ulcer. No gross osseous destruction.  03-31-16: chest x-ray: no acute cardiopulmonary disease  03-31-16: kub: mild constipation   09-27-16: kUB: no acute abdominal findings  NO NEW EXAMS     LABS REVIEWED: PREVIOUS  11-05-15: wbc 8.3; hgb 12.5; hct 36.8; mcv 91.1; plt 292; glucose 113; bun 15.9; creat 0.86; k+ 4.9; na++ 140; liver normal albumin 4.1  03-31-16: wbc 18.7; hgb 13.9; hct 41.8; mcv 95.7; plt 310; glucose 160; bun 17.2; creat 0.74; k+ 4.1; na++ 138; liver normal albumin 4.2 04-01-16: urine culture: providencia stuartii 04-02-16: glucose 110; bun 30.5; creat  0.99; k+ 4.0; na++ 135; ca 9.3; liver normal albumin 3.5  05-14-16: tsh 2.18; hgb a1c 5.9; chol 136; ldl 48; trig 248; hdl 39  08-11-16: urine micro-albumin 1.5  09-14-16: chol 142; ldl 60; trig 228; hdl 36 hgb a1c 6.0  09-28-16: wbc 7.5; hgb 11.5; hct 35.1; mcv 102.4; plt 355 glucose 208; bun 36.1; creat 0.92; k+ 5.8; na++ 139; ca 9.5; liver normal albumin 3.5  NO NEW LABS     Review of Systems  Constitutional: Negative for malaise/fatigue.  Respiratory: Negative for cough and shortness of breath.   Cardiovascular: Negative for chest pain, palpitations and leg swelling.  Gastrointestinal: Negative for abdominal pain, constipation and heartburn.  Musculoskeletal: Negative for back pain, joint pain and myalgias.  Skin: Negative.   Neurological: Negative for dizziness.  Psychiatric/Behavioral: The patient is not nervous/anxious.    Physical Exam  Constitutional: She appears well-developed and well-nourished. No distress.  Eyes: Conjunctivae are normal.  Neck: Neck supple. No thyromegaly present.  Cardiovascular: Normal rate, regular rhythm, normal heart sounds and intact distal pulses.   Pulmonary/Chest: Effort normal and breath sounds normal. No respiratory distress.  Abdominal: Soft. Bowel sounds are normal. She exhibits no distension.    Musculoskeletal: She exhibits no edema.  Is able to move all extremities   Lymphadenopathy:    She has no cervical adenopathy.  Neurological: She is alert.  Skin: Skin is warm and dry. She is not diaphoretic.  Psychiatric: She has a normal mood and affect.    ASSESSMENT/ PLAN:  TODAY:   1.  Constipation:stable will continue linzess 72 mcg daily   2. Osteoarthritis: is stable  pain is presently being managed; will continue tylenol 1 gm daily  3. Depression with psychosis: is presently stable is presently not taking medications; will monitor   4. Nephrolithiasis; outlet obstruction: is on long term macrobid daily  is followed by urology.   5. Weight loss: her current weight is 156 pounds; her weight in April 2018: 162 pounds. Will continue supplements per facility protocol.    PREVIOUS PROBLEMS:   6. Hypertension:b/p 133/78 is stable; will continue lisinopril 5 mg daily; and lopressor 25 mg twice daily  Asa 325 mg daily    7. Diabetes:  hgb a1c is 5.9  She is presently stable her cbgs are good ; will continue will lower her lantus to 12 units every 12 hours  She is on a statin and ace and asa   8. Dyslipidemia: ldl is 48; is stable will continue lipitor 10 mg daily   9.  Alzheimer's disease: no change in status; is presently not taking medications will not make changes will monitor her status.  Her current weight is 160 pounds and is stable; will monitor .   MD is aware of resident's narcotic use and is in agreement with current plan of care. We will attempt to wean resident as apropriate     Ok Edwards NP [facility]  Contact [phone] Monday through Friday 8am- 5pm  After hours call [phone]

## 2016-10-21 ENCOUNTER — Encounter: Payer: Self-pay | Admitting: Adult Health

## 2016-10-21 ENCOUNTER — Non-Acute Institutional Stay (SKILLED_NURSING_FACILITY): Payer: Medicare Other | Admitting: Adult Health

## 2016-10-21 DIAGNOSIS — E119 Type 2 diabetes mellitus without complications: Secondary | ICD-10-CM

## 2016-10-21 DIAGNOSIS — Z794 Long term (current) use of insulin: Secondary | ICD-10-CM

## 2016-10-21 NOTE — Progress Notes (Signed)
Location:   [facility] Room Number: 115 B Place of Service:  SNF (31)   CODE STATUS: Full code  No Known Allergies  Chief Complaint  Patient presents with  . Acute Visit    diabetes     HPI:    Past Medical History:  Diagnosis Date  . Acute pyelonephritis   . Alzheimer's disease   . Bladder outlet obstruction   . Depression   . Diarrhea   . Environmental and seasonal allergies   . Essential hypertension, benign   . Foley catheter in place   . GERD (gastroesophageal reflux disease)   . History of urinary tract infection   . Hypertension   . Hypothyroidism   . Insomnia   . Nephrolithiasis   . Osteoarthritis   . Reflux esophagitis   . Rickets, active   . Sepsis ([place])   . Unspecified constipation   . Unspecified psychosis   . Unspecified vitamin D deficiency     Past Surgical History:  Procedure Laterality Date  . ABDOMINAL HYSTERECTOMY    . CYSTOSCOPY W/ URETERAL STENT PLACEMENT Bilateral 05/18/2015   Procedure: CYSTOSCOPY WITH RETROGRADE PYELOGRAM/URETERAL STENT PLACEMENT;  Surgeon: Carolan Clines, MD;  Location: [facility];  Service: Urology;  Laterality: Bilateral;  . CYSTOSCOPY W/ URETERAL STENT REMOVAL Bilateral 09/12/2015   Procedure: CYSTOSCOPY WITH STENT REMOVAL;  Surgeon: Carolan Clines, MD;  Location: [facility];  Service: Urology;  Laterality: Bilateral;  1 HOUR  . CYSTOSCOPY WITH RETROGRADE PYELOGRAM, URETEROSCOPY AND STENT PLACEMENT Bilateral 07/18/2015   Procedure: CYSTOSCOPY REMOVE LEFT DOUBLE J STENT LEFT RETROGRADE PYELOGRAM, LEFT URETEROSCOPY, PYELOSCOPY, REPLACEMENT OF LEFT DOUBLE J STENT;  Surgeon: Carolan Clines, MD;  Location: [facility];  Service: Urology;  Laterality: Bilateral;  . HOLMIUM LASER APPLICATION Bilateral 1/66/0630   Procedure: HOLMIUM LASER OF LEFT LOWER URETERAL STONE, LEFT PYELOSCOPY, REPLACE LEFT DOUBLE J STENT REMOVE RIGHT DOUBLE J STENT RIGHT RETROGRADE PYELOGRAM, REPLACEMENT RIGHT DOUBLE J STENT ;  Surgeon:  Carolan Clines, MD;  Location: [facility];  Service: Urology;  Laterality: Bilateral;  . KNEE ARTHROSCOPY Left 1997    Social History   Social History  . Marital status: Married    Spouse name: N/A  . Number of children: N/A  . Years of education: N/A   Occupational History  . Not on file.   Social History Main Topics  . Smoking status: Never Smoker  . Smokeless tobacco: Never Used  . Alcohol use No  . Drug use: No  . Sexual activity: Not on file   Other Topics Concern  . Not on file   Social History Narrative  . No narrative on file   Family History  Problem Relation Age of Onset  . Family history unknown: Yes      VITAL SIGNS BP 127/67   Pulse 63   Temp 98.1 F (36.7 C)   Resp 18   Ht 4\' 11"  (1.499 m)   Wt 156 lb 14.4 oz (71.2 kg)   SpO2 96%   BMI 31.69 kg/m   Patient's Medications  New Prescriptions   No medications on file  Previous Medications   ACETAMINOPHEN (TYLENOL) 325 MG TABLET    Take 650 mg by mouth every 6 (six) hours as needed for moderate pain.   ACETAMINOPHEN (TYLENOL) 500 MG TABLET    Take 1,000 mg by mouth daily. Pain management   ASPIRIN 325 MG EC TABLET    Take 325 mg by mouth daily.   ATORVASTATIN (LIPITOR) 10 MG TABLET  Take 10 mg by mouth daily at 6 PM.   CHOLECALCIFEROL (VITAMIN D) 1000 UNITS TABLET    Take 2,000 Units by mouth daily.   INSULIN DETEMIR (LEVEMIR FLEXPEN) 100 UNIT/ML PEN    Inject 12 Units into the skin every morning. And 12 units subcutaneously in the evening   LINACLOTIDE (LINZESS) 72 MCG CAPSULE    Take 72 mcg by mouth daily.   LISINOPRIL (PRINIVIL,ZESTRIL) 5 MG TABLET    Take 5 mg by mouth daily.    LORATADINE (CLARITIN) 10 MG TABLET    Take 10 mg by mouth daily as needed for allergies.    METOPROLOL TARTRATE (LOPRESSOR) 25 MG TABLET    Take 25 mg by mouth 2 (two) times daily. For HTN  Hold if B/P < 100/60 or HR < 60   NITROFURANTOIN, MACROCRYSTAL-MONOHYDRATE, (MACROBID) 100 MG CAPSULE    Take 1 capsule (100 mg  total) by mouth at bedtime.   OMEGA-3 FATTY ACIDS (FISH OIL OMEGA-3 PO)    Give 1 capsule by mouth every morning   ONDANSETRON (ZOFRAN) 4 MG TABLET    Take 4 mg by mouth every 6 (six) hours as needed for nausea or vomiting.    OXYCODONE-ACETAMINOPHEN (PERCOCET/ROXICET) 5-325 MG TABLET    Take 1 tablet by mouth every 6 (six) hours as needed for severe pain.   PHENAZOPYRIDINE (PYRIDIUM) 200 MG TABLET    Take 200 mg by mouth every 8 (eight) hours as needed for pain.   POLYVINYL ALCOHOL-POVIDONE (FRESHKOTE) 2.7-2 % SOLN    Apply 1 drop to eye 3 (three) times daily. To both eyes  Modified Medications   No medications on file  Discontinued Medications   No medications on file     SIGNIFICANT DIAGNOSTIC EXAMS  PREVIOUS   05-18-15: ct of abdomen and pelvis: 1. Motion and hardware degraded images. 2. Moderate left-sided hydroureteronephrosis secondary to a distal left ureteric 11 x 13 mm calculus. Decreased left-sided renal function. 3. Large burden bilateral renal collecting system stones, including stones within both extrarenal pelves. 4. Cirrhosis with small volume ascites. 5. Proctitis, likely related to C. difficile colitis, given the clinical history. 6. Bladder wall irregularity which could relate to cystitis. Bladder diverticulum, suggesting a component of outlet obstruction. 7. Skin irregularity superficial to the coccyx. Recommend physical exam correlation to exclude decubitus ulcer. No gross osseous destruction.  03-31-16: chest x-ray: no acute cardiopulmonary disease  03-31-16: kub: mild constipation   09-27-16: kUB: no acute abdominal findings  NO NEW EXAMS     LABS REVIEWED: PREVIOUS  11-05-15: wbc 8.3; hgb 12.5; hct 36.8; mcv 91.1; plt 292; glucose 113; bun 15.9; creat 0.86; k+ 4.9; na++ 140; liver normal albumin 4.1  03-31-16: wbc 18.7; hgb 13.9; hct 41.8; mcv 95.7; plt 310; glucose 160; bun 17.2; creat 0.74; k+ 4.1; na++ 138; liver normal albumin 4.2 04-01-16: urine culture:  providencia stuartii 04-02-16: glucose 110; bun 30.5; creat 0.99; k+ 4.0; na++ 135; ca 9.3; liver normal albumin 3.5  05-14-16: tsh 2.18; hgb a1c 5.9; chol 136; ldl 48; trig 248; hdl 39  08-11-16: urine micro-albumin 1.5  09-14-16: chol 142; ldl 60; trig 228; hdl 36 hgb a1c 6.0  09-28-16: wbc 7.5; hgb 11.5; hct 35.1; mcv 102.4; plt 355 glucose 208; bun 36.1; creat 0.92; k+ 5.8; na++ 139; ca 9.5; liver normal albumin 3.5  NO NEW LABS     Review of Systems  Constitutional: Negative for malaise/fatigue.  Respiratory: Negative for cough and shortness of breath.   Cardiovascular:  Negative for chest pain, palpitations and leg swelling.  Gastrointestinal: Negative for abdominal pain, constipation and heartburn.  Musculoskeletal: Negative for back pain, joint pain and myalgias.  Skin: Negative.   Neurological: Negative for dizziness.  Psychiatric/Behavioral: The patient is not nervous/anxious.     Physical Exam  Constitutional: She appears well-developed and well-nourished. No distress.  HENT:  Head: Normocephalic.  Eyes: Conjunctivae are normal.  Neck: Neck supple. No thyromegaly present.  Cardiovascular: Normal rate, regular rhythm, normal heart sounds and intact distal pulses.   Pulmonary/Chest: Effort normal and breath sounds normal. No respiratory distress.  Abdominal: Soft. Bowel sounds are normal. She exhibits no distension. There is no tenderness.  Musculoskeletal: She exhibits no edema.  Able to move all extremities   Lymphadenopathy:    She has no cervical adenopathy.  Neurological: She is alert.  Skin: Skin is warm and dry. She is not diaphoretic.  Psychiatric: She has a normal mood and affect.    ASSESSMENT/ PLAN:  TODAY:   1. Diabetes:stable   hgb a1c is 5.9  She is presently stable her cbgs are good ; will continue  lantus  12 units every 12 hours  She is on a statin and ace and asa   Diabetic foot exam performed     Ok Edwards NP [facility]  Contact  [phone] Monday through Friday 8am- 5pm  After hours call [phone]

## 2016-10-25 DIAGNOSIS — R634 Abnormal weight loss: Secondary | ICD-10-CM | POA: Insufficient documentation

## 2016-11-03 ENCOUNTER — Non-Acute Institutional Stay (SKILLED_NURSING_FACILITY): Payer: Medicare Other | Admitting: Adult Health

## 2016-11-03 ENCOUNTER — Encounter: Payer: Self-pay | Admitting: Adult Health

## 2016-11-03 DIAGNOSIS — I10 Essential (primary) hypertension: Secondary | ICD-10-CM | POA: Diagnosis not present

## 2016-11-03 DIAGNOSIS — E119 Type 2 diabetes mellitus without complications: Secondary | ICD-10-CM

## 2016-11-03 DIAGNOSIS — G301 Alzheimer's disease with late onset: Secondary | ICD-10-CM | POA: Diagnosis not present

## 2016-11-03 DIAGNOSIS — F0280 Dementia in other diseases classified elsewhere without behavioral disturbance: Secondary | ICD-10-CM

## 2016-11-03 DIAGNOSIS — I152 Hypertension secondary to endocrine disorders: Secondary | ICD-10-CM

## 2016-11-03 DIAGNOSIS — Z794 Long term (current) use of insulin: Secondary | ICD-10-CM

## 2016-11-03 DIAGNOSIS — E1159 Type 2 diabetes mellitus with other circulatory complications: Secondary | ICD-10-CM

## 2016-11-03 NOTE — Progress Notes (Signed)
Location:   [facility] Room Number: 115 B Place of Service:  SNF (31)   CODE STATUS: Full code  No Known Allergies  Chief Complaint  Patient presents with  . Acute Visit    Care Plan Meeting    HPI:  The care plan team has come together for her routine care plan meeting. There are no reports of falls; no acute illnesses present; no reports of weight loss. She told us that she is fine and did not want to speak after that. Her care plan has been reviewed and her medications reviewed. There are no nursing concerns at this time.    Past Medical History:  Diagnosis Date  . Acute pyelonephritis   . Alzheimer's disease   . Bladder outlet obstruction   . Depression   . Diarrhea   . Environmental and seasonal allergies   . Essential hypertension, benign   . Foley catheter in place   . GERD (gastroesophageal reflux disease)   . History of urinary tract infection   . Hypertension   . Hypothyroidism   . Insomnia   . Nephrolithiasis   . Osteoarthritis   . Reflux esophagitis   . Rickets, active   . [place]   . Unspecified constipation   . Unspecified psychosis   . Unspecified vitamin D deficiency     Past Surgical History:  Procedure Laterality Date  . ABDOMINAL HYSTERECTOMY    . CYSTOSCOPY W/ URETERAL STENT PLACEMENT Bilateral 05/18/2015   Procedure: CYSTOSCOPY WITH RETROGRADE PYELOGRAM/URETERAL STENT PLACEMENT;  Surgeon: Carolan Clines, MD;  Location: [facility];  Service: Urology;  Laterality: Bilateral;  . CYSTOSCOPY W/ URETERAL STENT REMOVAL Bilateral 09/12/2015   Procedure: CYSTOSCOPY WITH STENT REMOVAL;  Surgeon: Carolan Clines, MD;  Location: [facility];  Service: Urology;  Laterality: Bilateral;  1 HOUR  . CYSTOSCOPY WITH RETROGRADE PYELOGRAM, URETEROSCOPY AND STENT PLACEMENT Bilateral 07/18/2015   Procedure: CYSTOSCOPY REMOVE LEFT DOUBLE J STENT LEFT RETROGRADE PYELOGRAM, LEFT URETEROSCOPY, PYELOSCOPY, REPLACEMENT OF LEFT DOUBLE J STENT;  Surgeon:  Carolan Clines, MD;  Location: [facility];  Service: Urology;  Laterality: Bilateral;  . HOLMIUM LASER APPLICATION Bilateral 1/61/0960   Procedure: HOLMIUM LASER OF LEFT LOWER URETERAL STONE, LEFT PYELOSCOPY, REPLACE LEFT DOUBLE J STENT REMOVE RIGHT DOUBLE J STENT RIGHT RETROGRADE PYELOGRAM, REPLACEMENT RIGHT DOUBLE J STENT ;  Surgeon: Carolan Clines, MD;  Location: [facility];  Service: Urology;  Laterality: Bilateral;  . KNEE ARTHROSCOPY Left 1997    Social History   Social History  . Marital status: Married    Spouse name: N/A  . Number of children: N/A  . Years of education: N/A   Occupational History  . Not on file.   Social History Main Topics  . Smoking status: Never Smoker  . Smokeless tobacco: Never Used  . Alcohol use No  . Drug use: No  . Sexual activity: Not on file   Other Topics Concern  . Not on file   Social History Narrative  . No narrative on file   Family History  Problem Relation Age of Onset  . Family history unknown: Yes      VITAL SIGNS BP 130/60   Pulse 78   Temp 97.8 F (36.6 C)   Resp 18   Ht 4\' 11"  (1.499 m)   Wt 156 lb 14.4 oz (71.2 kg)   SpO2 98%   BMI 31.69 kg/m   Patient's Medications  New Prescriptions   No medications on file  Previous Medications  ACETAMINOPHEN (TYLENOL) 325 MG TABLET    Take 650 mg by mouth every 6 (six) hours as needed for moderate pain.   ACETAMINOPHEN (TYLENOL) 500 MG TABLET    Take 1,000 mg by mouth daily. Pain management   ASPIRIN 325 MG EC TABLET    Take 325 mg by mouth daily.   ATORVASTATIN (LIPITOR) 10 MG TABLET    Take 10 mg by mouth daily at 6 PM.   CHOLECALCIFEROL (VITAMIN D) 1000 UNITS TABLET    Take 2,000 Units by mouth daily.   INSULIN DETEMIR (LEVEMIR FLEXPEN) 100 UNIT/ML PEN    Inject 12 Units into the skin every morning. And 12 units subcutaneously in the evening   LINACLOTIDE (LINZESS) 72 MCG CAPSULE    Take 72 mcg by mouth daily.   LISINOPRIL (PRINIVIL,ZESTRIL) 5 MG TABLET    Take 5 mg  by mouth daily.    LORATADINE (CLARITIN) 10 MG TABLET    Take 10 mg by mouth daily as needed for allergies.    METOPROLOL TARTRATE (LOPRESSOR) 25 MG TABLET    Take 25 mg by mouth 2 (two) times daily. For HTN  Hold if B/P < 100/60 or HR < 60   NITROFURANTOIN, MACROCRYSTAL-MONOHYDRATE, (MACROBID) 100 MG CAPSULE    Take 1 capsule (100 mg total) by mouth at bedtime.   OMEGA-3 FATTY ACIDS (FISH OIL OMEGA-3 PO)    Give 1 capsule by mouth every morning   ONDANSETRON (ZOFRAN) 4 MG TABLET    Take 4 mg by mouth every 6 (six) hours as needed for nausea or vomiting.    PHENAZOPYRIDINE (PYRIDIUM) 200 MG TABLET    Take 200 mg by mouth every 8 (eight) hours as needed for pain.   POLYVINYL ALCOHOL-POVIDONE (FRESHKOTE) 2.7-2 % SOLN    Apply 1 drop to eye 3 (three) times daily. To both eyes  Modified Medications   Modified Medication Previous Medication   OXYCODONE-ACETAMINOPHEN (PERCOCET/ROXICET) 5-325 MG TABLET oxyCODONE-acetaminophen (PERCOCET/ROXICET) 5-325 MG tablet      Take 1 tablet by mouth every 6 (six) hours.    Take 1 tablet by mouth every 6 (six) hours as needed for severe pain.  Discontinued Medications   No medications on file     SIGNIFICANT DIAGNOSTIC EXAMS   PREVIOUS   05-18-15: ct of abdomen and pelvis: 1. Motion and hardware degraded images. 2. Moderate left-sided hydroureteronephrosis secondary to a distal left ureteric 11 x 13 mm calculus. Decreased left-sided renal function. 3. Large burden bilateral renal collecting system stones, including stones within both extrarenal pelves. 4. Cirrhosis with small volume ascites. 5. Proctitis, likely related to C. difficile colitis, given the clinical history. 6. Bladder wall irregularity which could relate to cystitis. Bladder diverticulum, suggesting a component of outlet obstruction. 7. Skin irregularity superficial to the coccyx. Recommend physical exam correlation to exclude decubitus ulcer. No gross osseous destruction.  03-31-16: chest  x-ray: no acute cardiopulmonary disease  03-31-16: kub: mild constipation   09-27-16: kUB: no acute abdominal findings  NO NEW EXAMS     LABS REVIEWED: PREVIOUS  11-05-15: wbc 8.3; hgb 12.5; hct 36.8; mcv 91.1; plt 292; glucose 113; bun 15.9; creat 0.86; k+ 4.9; na++ 140; liver normal albumin 4.1  03-31-16: wbc 18.7; hgb 13.9; hct 41.8; mcv 95.7; plt 310; glucose 160; bun 17.2; creat 0.74; k+ 4.1; na++ 138; liver normal albumin 4.2 04-01-16: urine culture: providencia stuartii 04-02-16: glucose 110; bun 30.5; creat 0.99; k+ 4.0; na++ 135; ca 9.3; liver normal albumin 3.5  05-14-16: tsh  2.18; hgb a1c 5.9; chol 136; ldl 48; trig 248; hdl 39  08-11-16: urine micro-albumin 1.5  09-14-16: chol 142; ldl 60; trig 228; hdl 36 hgb a1c 6.0  09-28-16: wbc 7.5; hgb 11.5; hct 35.1; mcv 102.4; plt 355 glucose 208; bun 36.1; creat 0.92; k+ 5.8; na++ 139; ca 9.5; liver normal albumin 3.5  NO NEW LABS     Review of Systems  Constitutional: Negative for malaise/fatigue.  Respiratory: Negative for cough and shortness of breath.   Cardiovascular: Negative for chest pain, palpitations and leg swelling.  Gastrointestinal: Negative for abdominal pain, constipation and heartburn.  Musculoskeletal: Negative for back pain, joint pain and myalgias.  Skin: Negative.   Neurological: Negative for dizziness.  Psychiatric/Behavioral: The patient is not nervous/anxious.     Physical Exam  Constitutional: She appears well-developed and well-nourished. No distress.  Neck: Neck supple. No thyromegaly present.  Cardiovascular: Normal rate, regular rhythm, normal heart sounds and intact distal pulses.   Pulmonary/Chest: Effort normal and breath sounds normal. No respiratory distress.  Abdominal: Soft. Bowel sounds are normal. She exhibits no distension. There is no tenderness.  Musculoskeletal: She exhibits no edema.  Is able to move all extremities   Lymphadenopathy:    She has no cervical adenopathy.  Neurological: She  is alert.  Skin: Skin is warm and dry. She is not diaphoretic.  Psychiatric: She has a normal mood and affect.    ASSESSMENT/ PLAN:  TODAY:   1. Diabetes 2. Alzheimer's disease 3. Hypertension  Will continue her current plan of care; will not make changes at this time and will monitor her status.   Time spent with patient and care plan team 30 minutes: to discuss her overall health status; her are plan; future care needs and medications. Everyone present in the care plan team did verbalize understanding.     MD is aware of resident's narcotic use and is in agreement with current plan of care. We will attempt to wean resident as apropriate     Ok Edwards NP [facility]  Contact [phone] Monday through Friday 8am- 5pm  After hours call [phone]

## 2016-11-08 ENCOUNTER — Other Ambulatory Visit: Payer: Self-pay

## 2016-11-08 MED ORDER — OXYCODONE-ACETAMINOPHEN 5-325 MG PO TABS: 1.0000 | ORAL_TABLET | Freq: Four times a day (QID) | ORAL | 0 refills | Status: DC

## 2016-11-08 NOTE — Telephone Encounter (Signed)
RX faxed to [facility] @ [phone], phone number [phone] 

## 2016-11-15 ENCOUNTER — Encounter: Payer: Self-pay | Admitting: Adult Health

## 2016-11-15 ENCOUNTER — Non-Acute Institutional Stay (SKILLED_NURSING_FACILITY): Payer: Medicare Other | Admitting: Adult Health

## 2016-11-15 DIAGNOSIS — E785 Hyperlipidemia, unspecified: Secondary | ICD-10-CM

## 2016-11-15 DIAGNOSIS — I10 Essential (primary) hypertension: Secondary | ICD-10-CM | POA: Diagnosis not present

## 2016-11-15 DIAGNOSIS — E1159 Type 2 diabetes mellitus with other circulatory complications: Secondary | ICD-10-CM | POA: Diagnosis not present

## 2016-11-15 DIAGNOSIS — Z794 Long term (current) use of insulin: Secondary | ICD-10-CM | POA: Diagnosis not present

## 2016-11-15 DIAGNOSIS — F0280 Dementia in other diseases classified elsewhere without behavioral disturbance: Secondary | ICD-10-CM | POA: Diagnosis not present

## 2016-11-15 DIAGNOSIS — G301 Alzheimer's disease with late onset: Secondary | ICD-10-CM | POA: Diagnosis not present

## 2016-11-15 DIAGNOSIS — E119 Type 2 diabetes mellitus without complications: Secondary | ICD-10-CM | POA: Diagnosis not present

## 2016-11-15 DIAGNOSIS — E1169 Type 2 diabetes mellitus with other specified complication: Secondary | ICD-10-CM

## 2016-11-15 NOTE — Progress Notes (Signed)
Location:   [facility] Room Number: 115 B Place of Service:  SNF (31)   CODE STATUS: Full Code  No Known Allergies  Chief Complaint  Patient presents with  . Medical Management of Chronic Issues    Hypertension; diabetes; dyslipidemia; Alzheimer's disease    HPI:  She is a 81 year old long term resident of this facility being seen for the management of her chronic illnesses: hypertension associated with diabetes; diabetes type II insulin dependent; dyslipidemia associated with diabetes; Alzheimer's disease. She remains in bed nearly all of the time per her choice. She denies any headaches; changes in appetite; no excessive thirst. There are no nursing concerns at this time.   Past Medical History:  Diagnosis Date  . Acute pyelonephritis   . Alzheimer's disease   . Bladder outlet obstruction   . Depression   . Diarrhea   . Environmental and seasonal allergies   . Essential hypertension, benign   . Foley catheter in place   . GERD (gastroesophageal reflux disease)   . History of urinary tract infection   . Hypertension   . Hypothyroidism   . Insomnia   . Nephrolithiasis   . Osteoarthritis   . Reflux esophagitis   . Rickets, active   . Sepsis ([place])   . Unspecified constipation   . Unspecified psychosis   . Unspecified vitamin D deficiency     Past Surgical History:  Procedure Laterality Date  . ABDOMINAL HYSTERECTOMY    . CYSTOSCOPY W/ URETERAL STENT PLACEMENT Bilateral 05/18/2015   Procedure: CYSTOSCOPY WITH RETROGRADE PYELOGRAM/URETERAL STENT PLACEMENT;  Surgeon: Carolan Clines, MD;  Location: [facility];  Service: Urology;  Laterality: Bilateral;  . CYSTOSCOPY W/ URETERAL STENT REMOVAL Bilateral 09/12/2015   Procedure: CYSTOSCOPY WITH STENT REMOVAL;  Surgeon: Carolan Clines, MD;  Location: [facility];  Service: Urology;  Laterality: Bilateral;  1 HOUR  . CYSTOSCOPY WITH RETROGRADE PYELOGRAM, URETEROSCOPY AND STENT PLACEMENT Bilateral 07/18/2015   Procedure: CYSTOSCOPY REMOVE LEFT DOUBLE J STENT LEFT RETROGRADE PYELOGRAM, LEFT URETEROSCOPY, PYELOSCOPY, REPLACEMENT OF LEFT DOUBLE J STENT;  Surgeon: Carolan Clines, MD;  Location: [facility];  Service: Urology;  Laterality: Bilateral;  . HOLMIUM LASER APPLICATION Bilateral 6/94/8546   Procedure: HOLMIUM LASER OF LEFT LOWER URETERAL STONE, LEFT PYELOSCOPY, REPLACE LEFT DOUBLE J STENT REMOVE RIGHT DOUBLE J STENT RIGHT RETROGRADE PYELOGRAM, REPLACEMENT RIGHT DOUBLE J STENT ;  Surgeon: Carolan Clines, MD;  Location: [facility];  Service: Urology;  Laterality: Bilateral;  . KNEE ARTHROSCOPY Left 1997    Social History   Social History  . Marital status: Married    Spouse name: N/A  . Number of children: N/A  . Years of education: N/A   Occupational History  . Not on file.   Social History Main Topics  . Smoking status: Never Smoker  . Smokeless tobacco: Never Used  . Alcohol use No  . Drug use: No  . Sexual activity: Not on file   Other Topics Concern  . Not on file   Social History Narrative  . No narrative on file   Family History  Problem Relation Age of Onset  . Family history unknown: Yes      VITAL SIGNS BP (!) 150/80   Pulse 72   Temp (!) 97 F (36.1 C)   Resp 18   Ht 4\' 11"  (1.499 m)   Wt 155 lb 9.6 oz (70.6 kg)   SpO2 95%   BMI 31.43 kg/m   Patient's Medications  New Prescriptions  No medications on file  Previous Medications   ACETAMINOPHEN (TYLENOL) 325 MG TABLET    Take 650 mg by mouth every 6 (six) hours as needed for moderate pain.   ACETAMINOPHEN (TYLENOL) 500 MG TABLET    Take 1,000 mg by mouth daily. Pain management   ASPIRIN 325 MG EC TABLET    Take 325 mg by mouth daily.   ATORVASTATIN (LIPITOR) 10 MG TABLET    Take 10 mg by mouth daily at 6 PM.   CHOLECALCIFEROL (VITAMIN D) 1000 UNITS TABLET    Take 2,000 Units by mouth daily.   INSULIN DETEMIR (LEVEMIR FLEXPEN) 100 UNIT/ML PEN    Inject 12 Units into the skin every morning. And 12 units  subcutaneously in the evening   LINACLOTIDE (LINZESS) 72 MCG CAPSULE    Take 72 mcg by mouth daily.   LISINOPRIL (PRINIVIL,ZESTRIL) 5 MG TABLET    Take 5 mg by mouth daily.    LORATADINE (CLARITIN) 10 MG TABLET    Take 10 mg by mouth daily as needed for allergies.    METOPROLOL TARTRATE (LOPRESSOR) 25 MG TABLET    Take 25 mg by mouth 2 (two) times daily. For HTN  Hold if B/P < 100/60 or HR < 60   NITROFURANTOIN, MACROCRYSTAL-MONOHYDRATE, (MACROBID) 100 MG CAPSULE    Take 1 capsule (100 mg total) by mouth at bedtime.   OMEGA-3 FATTY ACIDS (FISH OIL OMEGA-3 PO)    Give 1 capsule by mouth every morning   ONDANSETRON (ZOFRAN) 4 MG TABLET    Take 4 mg by mouth every 6 (six) hours as needed for nausea or vomiting.    OXYCODONE-ACETAMINOPHEN (PERCOCET/ROXICET) 5-325 MG TABLET    Take 1 tablet by mouth every 6 (six) hours.   PHENAZOPYRIDINE (PYRIDIUM) 200 MG TABLET    Take 200 mg by mouth every 8 (eight) hours as needed for pain.   POLYVINYL ALCOHOL-POVIDONE (FRESHKOTE) 2.7-2 % SOLN    Apply 1 drop to eye 3 (three) times daily. To both eyes  Modified Medications   No medications on file  Discontinued Medications   No medications on file     SIGNIFICANT DIAGNOSTIC EXAMS  PREVIOUS   05-18-15: ct of abdomen and pelvis: 1. Motion and hardware degraded images. 2. Moderate left-sided hydroureteronephrosis secondary to a distal left ureteric 11 x 13 mm calculus. Decreased left-sided renal function. 3. Large burden bilateral renal collecting system stones, including stones within both extrarenal pelves. 4. Cirrhosis with small volume ascites. 5. Proctitis, likely related to C. difficile colitis, given the clinical history. 6. Bladder wall irregularity which could relate to cystitis. Bladder diverticulum, suggesting a component of outlet obstruction. 7. Skin irregularity superficial to the coccyx. Recommend physical exam correlation to exclude decubitus ulcer. No gross osseous destruction.   NO NEW  EXAMS     LABS REVIEWED: PREVIOUS  11-05-15: wbc 8.3; hgb 12.5; hct 36.8; mcv 91.1; plt 292; glucose 113; bun 15.9; creat 0.86; k+ 4.9; na++ 140; liver normal albumin 4.1  03-31-16: wbc 18.7; hgb 13.9; hct 41.8; mcv 95.7; plt 310; glucose 160; bun 17.2; creat 0.74; k+ 4.1; na++ 138; liver normal albumin 4.2 04-01-16: urine culture: providencia stuartii 04-02-16: glucose 110; bun 30.5; creat 0.99; k+ 4.0; na++ 135; ca 9.3; liver normal albumin 3.5  05-14-16: tsh 2.18; hgb a1c 5.9; chol 136; ldl 48; trig 248; hdl 39  08-11-16: urine micro-albumin 1.5  09-14-16: chol 142; ldl 60; trig 228; hdl 36 hgb a1c 6.0  09-28-16: wbc 7.5; hgb 11.5;  hct 35.1; mcv 102.4; plt 355 glucose 208; bun 36.1; creat 0.92; k+ 5.8; na++ 139; ca 9.5; liver normal albumin 3.5  NO NEW LABS     Review of Systems  Constitutional: Negative for malaise/fatigue.  Respiratory: Negative for cough and shortness of breath.   Cardiovascular: Negative for chest pain, palpitations and leg swelling.  Gastrointestinal: Negative for abdominal pain, constipation and heartburn.  Musculoskeletal: Negative for back pain, joint pain and myalgias.  Skin: Negative.   Neurological: Negative for dizziness.  Psychiatric/Behavioral: The patient is not nervous/anxious.      Physical Exam  Constitutional: She appears well-developed and well-nourished. No distress.  Neck: Neck supple. No thyromegaly present.  Cardiovascular: Normal rate, regular rhythm and intact distal pulses.   Murmur heard. Pulmonary/Chest: Effort normal and breath sounds normal. No respiratory distress.  Abdominal: Soft. Bowel sounds are normal. She exhibits no distension. There is no tenderness.  Musculoskeletal: She exhibits no edema.  Is able to move all extremities   Lymphadenopathy:    She has no cervical adenopathy.  Neurological: She is alert.  Skin: Skin is warm and dry. She is not diaphoretic.  Psychiatric: She has a normal mood and affect.    ASSESSMENT/  PLAN:  TODAY:   1.  Hypertension:b/p 150/80 is stable; will continue lisinopril 5 mg daily; and lopressor 25 mg twice daily  Asa 325 mg daily    2. Diabetes:  hgb a1c is 5.9  She is presently stable her cbgs are good ; will continue  lantus  12 units every 12 hours  She is on a statin and ace and asa   3. Dyslipidemia: ldl is 48; is stable will continue lipitor 10 mg daily   4.  Alzheimer's disease: no change in status; is presently not taking medications will not make changes will monitor her status.  Her current weight is 155 pounds. She is slowly losing weight over time; which is an unfortunate but expected out come of this disease process.    PREVIOUS PROBLEMS:   5.  Constipation:stable will continue linzess 72 mcg daily   6. Osteoarthritis: is stable  pain is presently being managed; will continue tylenol 1 gm daily  7. Depression with psychosis: is presently stable is presently not taking medications; will monitor   8. Nephrolithiasis; outlet obstruction: is on long term macrobid daily  is followed by urology.   9. Weight loss: her current weight is 155 pounds; her weight in April 2018: 162 pounds. Will continue supplements per facility protocol.     Ok Edwards NP [facility]  Contact [phone] Monday through Friday 8am- 5pm  After hours call [phone]

## 2016-11-16 DIAGNOSIS — E1165 Type 2 diabetes mellitus with hyperglycemia: Secondary | ICD-10-CM | POA: Diagnosis not present

## 2016-11-16 DIAGNOSIS — D649 Anemia, unspecified: Secondary | ICD-10-CM | POA: Diagnosis not present

## 2016-11-16 LAB — BASIC METABOLIC PANEL
BUN: 18 (ref 4–21)
Creatinine: 0.8 (ref 0.5–1.1)
Glucose: 97
Potassium: 5 (ref 3.4–5.3)
SODIUM: 136 — AB (ref 137–147)

## 2016-12-02 DIAGNOSIS — Z23 Encounter for immunization: Secondary | ICD-10-CM | POA: Diagnosis not present

## 2016-12-07 ENCOUNTER — Other Ambulatory Visit: Payer: Self-pay

## 2016-12-07 MED ORDER — OXYCODONE-ACETAMINOPHEN 5-325 MG PO TABS: 1.0000 | ORAL_TABLET | Freq: Four times a day (QID) | ORAL | 0 refills | Status: DC

## 2016-12-07 NOTE — Telephone Encounter (Signed)
RX faxed to [facility] @ [phone], phone number [phone] 

## 2016-12-13 DIAGNOSIS — Z794 Long term (current) use of insulin: Secondary | ICD-10-CM | POA: Diagnosis not present

## 2016-12-13 DIAGNOSIS — B351 Tinea unguium: Secondary | ICD-10-CM | POA: Diagnosis not present

## 2016-12-13 DIAGNOSIS — E1140 Type 2 diabetes mellitus with diabetic neuropathy, unspecified: Secondary | ICD-10-CM | POA: Diagnosis not present

## 2016-12-16 ENCOUNTER — Non-Acute Institutional Stay (SKILLED_NURSING_FACILITY): Payer: Medicare Other | Admitting: Adult Health

## 2016-12-16 ENCOUNTER — Encounter: Payer: Self-pay | Admitting: Adult Health

## 2016-12-16 DIAGNOSIS — F32A Depression, unspecified: Secondary | ICD-10-CM

## 2016-12-16 DIAGNOSIS — K5909 Other constipation: Secondary | ICD-10-CM | POA: Diagnosis not present

## 2016-12-16 DIAGNOSIS — F329 Major depressive disorder, single episode, unspecified: Secondary | ICD-10-CM

## 2016-12-16 DIAGNOSIS — R634 Abnormal weight loss: Secondary | ICD-10-CM | POA: Diagnosis not present

## 2016-12-16 DIAGNOSIS — M150 Primary generalized (osteo)arthritis: Secondary | ICD-10-CM | POA: Diagnosis not present

## 2016-12-16 DIAGNOSIS — N200 Calculus of kidney: Secondary | ICD-10-CM

## 2016-12-16 DIAGNOSIS — M159 Polyosteoarthritis, unspecified: Secondary | ICD-10-CM

## 2016-12-16 NOTE — Progress Notes (Signed)
Location:   [place] Room Number: [place] of Service:      CODE STATUS: Full Code  No Known Allergies  Chief Complaint  Patient presents with  . Medical Management of Chronic Issues    Constipation; nephrolithiasis osteoarthritis; depression     HPI:  She is a 81 year old long term resident of this facility being seen for the management of her chronic illnesses: chronic constipation;osteoarthritis; nephrolithiasis; non-intensional weight loss and depression. She denies any constipation; no abdominal pain; no dysuria. There are no reports of changes in behaviors or appetite. There are no nursing concerns at this time.  Past Medical History:  Diagnosis Date  . Acute pyelonephritis   . Alzheimer's disease   . Bladder outlet obstruction   . Depression   . Diarrhea   . Environmental and seasonal allergies   . Essential hypertension, benign   . Foley catheter in place   . GERD (gastroesophageal reflux disease)   . History of urinary tract infection   . Hypertension   . Hypothyroidism   . Insomnia   . Nephrolithiasis   . Osteoarthritis   . Reflux esophagitis   . Rickets, active   . Sepsis ([place])   . Unspecified constipation   . Unspecified psychosis   . Unspecified vitamin D deficiency     Past Surgical History:  Procedure Laterality Date  . ABDOMINAL HYSTERECTOMY    . CYSTOSCOPY W/ URETERAL STENT PLACEMENT Bilateral 05/18/2015   Procedure: CYSTOSCOPY WITH RETROGRADE PYELOGRAM/URETERAL STENT PLACEMENT;  Surgeon: Carolan Clines, MD;  Location: [facility];  Service: Urology;  Laterality: Bilateral;  . CYSTOSCOPY W/ URETERAL STENT REMOVAL Bilateral 09/12/2015   Procedure: CYSTOSCOPY WITH STENT REMOVAL;  Surgeon: Carolan Clines, MD;  Location: [facility];  Service: Urology;  Laterality: Bilateral;  1 HOUR  . CYSTOSCOPY WITH RETROGRADE PYELOGRAM, URETEROSCOPY AND STENT PLACEMENT Bilateral 07/18/2015   Procedure: CYSTOSCOPY REMOVE LEFT DOUBLE J STENT LEFT  RETROGRADE PYELOGRAM, LEFT URETEROSCOPY, PYELOSCOPY, REPLACEMENT OF LEFT DOUBLE J STENT;  Surgeon: Carolan Clines, MD;  Location: [facility];  Service: Urology;  Laterality: Bilateral;  . HOLMIUM LASER APPLICATION Bilateral 7/84/6962   Procedure: HOLMIUM LASER OF LEFT LOWER URETERAL STONE, LEFT PYELOSCOPY, REPLACE LEFT DOUBLE J STENT REMOVE RIGHT DOUBLE J STENT RIGHT RETROGRADE PYELOGRAM, REPLACEMENT RIGHT DOUBLE J STENT ;  Surgeon: Carolan Clines, MD;  Location: [facility];  Service: Urology;  Laterality: Bilateral;  . KNEE ARTHROSCOPY Left 1997    Social History   Socioeconomic History  . Marital status: Married    Spouse name: Not on file  . Number of children: Not on file  . Years of education: Not on file  . Highest education level: Not on file  Social Needs  . Financial resource strain: Not on file  . Food insecurity - worry: Not on file  . Food insecurity - inability: Not on file  . Transportation needs - medical: Not on file  . Transportation needs - non-medical: Not on file  Occupational History  . Not on file  Tobacco Use  . Smoking status: Never Smoker  . Smokeless tobacco: Never Used  Substance and Sexual Activity  . Alcohol use: No  . Drug use: No  . Sexual activity: Not on file  Other Topics Concern  . Not on file  Social History Narrative  . Not on file   Family History  Family history unknown: Yes      VITAL SIGNS BP 122/84   Pulse 72   Temp (!)  97.5 F (36.4 C)   Resp 16   Ht 4\' 11"  (1.499 m)   Wt 154 lb 9.6 oz (70.1 kg)   SpO2 98%   BMI 31.23 kg/m     Medication List        Accurate as of 12/16/16 12:12 PM. Always use your most recent med list.          * acetaminophen 500 MG tablet Commonly known as:  TYLENOL   * acetaminophen 325 MG tablet Commonly known as:  TYLENOL   aspirin 325 MG EC tablet   atorvastatin 10 MG tablet Commonly known as:  LIPITOR   cholecalciferol 1000 units tablet Commonly known as:  VITAMIN D   FISH  OIL OMEGA-3 PO   FRESHKOTE 2.7-2 % Soln Generic drug:  Polyvinyl Alcohol-Povidone   LEVEMIR FLEXPEN 100 UNIT/ML Pen Generic drug:  Insulin Detemir   LINZESS 72 MCG capsule Generic drug:  linaclotide   lisinopril 5 MG tablet Commonly known as:  PRINIVIL,ZESTRIL   loratadine 10 MG tablet Commonly known as:  CLARITIN   metoprolol tartrate 25 MG tablet Commonly known as:  LOPRESSOR   nitrofurantoin (macrocrystal-monohydrate) 100 MG capsule Commonly known as:  MACROBID Take 1 capsule (100 mg total) by mouth at bedtime.   ondansetron 4 MG tablet Commonly known as:  ZOFRAN   oxyCODONE-acetaminophen 5-325 MG tablet Commonly known as:  PERCOCET/ROXICET Take 1 tablet every 6 (six) hours by mouth.   phenazopyridine 200 MG tablet Commonly known as:  PYRIDIUM      * This list has 2 medication(s) that are the same as other medications prescribed for you. Read the directions carefully, and ask your doctor or other care provider to review them with you.           SIGNIFICANT DIAGNOSTIC EXAMS  PREVIOUS   05-18-15: ct of abdomen and pelvis: 1. Motion and hardware degraded images. 2. Moderate left-sided hydroureteronephrosis secondary to a distal left ureteric 11 x 13 mm calculus. Decreased left-sided renal function. 3. Large burden bilateral renal collecting system stones, including stones within both extrarenal pelves. 4. Cirrhosis with small volume ascites. 5. Proctitis, likely related to C. difficile colitis, given the clinical history. 6. Bladder wall irregularity which could relate to cystitis. Bladder diverticulum, suggesting a component of outlet obstruction. 7. Skin irregularity superficial to the coccyx. Recommend physical exam correlation to exclude decubitus ulcer. No gross osseous destruction.   NO NEW EXAMS     LABS REVIEWED: PREVIOUS  03-31-16: wbc 18.7; hgb 13.9; hct 41.8; mcv 95.7; plt 310; glucose 160; bun 17.2; creat 0.74; k+ 4.1; na++ 138; liver normal  albumin 4.2 04-01-16: urine culture: providencia stuartii 04-02-16: glucose 110; bun 30.5; creat 0.99; k+ 4.0; na++ 135; ca 9.3; liver normal albumin 3.5  05-14-16: tsh 2.18; hgb a1c 5.9; chol 136; ldl 48; trig 248; hdl 39  08-11-16: urine micro-albumin 1.5  09-14-16: chol 142; ldl 60; trig 228; hdl 36 hgb a1c 6.0  09-28-16: wbc 7.5; hgb 11.5; hct 35.1; mcv 102.4; plt 355 glucose 208; bun 36.1; creat 0.92; k+ 5.8; na++ 139; ca 9.5; liver normal albumin 3.5  NO NEW LABS      Review of Systems  Constitutional: Negative for malaise/fatigue.  Respiratory: Negative for cough and shortness of breath.   Cardiovascular: Negative for chest pain, palpitations and leg swelling.  Gastrointestinal: Negative for abdominal pain, constipation and heartburn.  Musculoskeletal: Negative for back pain, joint pain and myalgias.  Skin: Negative.   Neurological: Negative for dizziness.  Psychiatric/Behavioral: The patient is not nervous/anxious.     Physical Exam  Constitutional: She appears well-developed and well-nourished. No distress.  Neck: Neck supple. No thyromegaly present.  Cardiovascular: Normal rate, regular rhythm and intact distal pulses.  Murmur heard. Pulmonary/Chest: Effort normal and breath sounds normal. No respiratory distress.  Abdominal: Soft. Bowel sounds are normal. She exhibits no distension. There is no tenderness.  Musculoskeletal: She exhibits no edema.  Is able to move all extremities   Lymphadenopathy:    She has no cervical adenopathy.  Neurological: She is alert.  Skin: Skin is warm and dry. She is not diaphoretic.  Psychiatric: She has a normal mood and affect.   ASSESSMENT/ PLAN:  TODAY:   1.  Constipation:stable will continue linzess 72 mcg daily   2. Osteoarthritis: is stable  pain is presently being managed; will continue tylenol 1 gm daily  3. Depression with psychosis: is presently stable is presently not taking medications; will monitor   4. Nephrolithiasis;  outlet obstruction: is on long term macrobid daily  is followed by urology.   5. Weight loss: her current weight is 154 pounds; her weight in April 2018: 162 pounds. Will continue supplements per facility protocol.    PREVIOUS PROBLEMS:   6.  Hypertension:b/p 122/84 is stable; will continue lisinopril 5 mg daily; and lopressor 25 mg twice daily  Asa 325 mg daily    7. Diabetes:  hgb a1c is 5.9  She is presently stable her cbgs are good ; will continue  lantus  12 units every 12 hours  She is on a statin and ace and asa   8. Dyslipidemia: ldl is 48; is stable will continue lipitor 10 mg daily   9.  Alzheimer's disease: no change in status; is presently not taking medications will not make changes will monitor her status.  Her current weight is 154 pounds. She is slowly losing weight over time; which is an unfortunate but expected out come of this disease process.     MD is aware of resident's narcotic use and is in agreement with current plan of care. We will attempt to wean resident as apropriate     Ok Edwards NP [facility]  Contact [phone] Monday through Friday 8am- 5pm  After hours call [phone]

## 2017-01-11 ENCOUNTER — Other Ambulatory Visit: Payer: Self-pay

## 2017-01-11 MED ORDER — OXYCODONE-ACETAMINOPHEN 5-325 MG PO TABS: 1.0000 | ORAL_TABLET | Freq: Four times a day (QID) | ORAL | 0 refills | Status: DC

## 2017-01-11 NOTE — Telephone Encounter (Signed)
RX faxed to [facility] @ [phone], phone number [phone] 

## 2017-01-13 ENCOUNTER — Encounter: Payer: Self-pay | Admitting: Adult Health

## 2017-01-13 ENCOUNTER — Non-Acute Institutional Stay (SKILLED_NURSING_FACILITY): Payer: Medicare Other | Admitting: Adult Health

## 2017-01-13 DIAGNOSIS — E785 Hyperlipidemia, unspecified: Secondary | ICD-10-CM

## 2017-01-13 DIAGNOSIS — Z794 Long term (current) use of insulin: Secondary | ICD-10-CM | POA: Diagnosis not present

## 2017-01-13 DIAGNOSIS — E1169 Type 2 diabetes mellitus with other specified complication: Secondary | ICD-10-CM | POA: Diagnosis not present

## 2017-01-13 DIAGNOSIS — E119 Type 2 diabetes mellitus without complications: Secondary | ICD-10-CM

## 2017-01-13 DIAGNOSIS — E1159 Type 2 diabetes mellitus with other circulatory complications: Secondary | ICD-10-CM | POA: Diagnosis not present

## 2017-01-13 DIAGNOSIS — I10 Essential (primary) hypertension: Secondary | ICD-10-CM

## 2017-01-13 NOTE — Progress Notes (Signed)
Location:   [place] Room Number: 115 B Place of Service:  SNF ([id number])   CODE STATUS: Full Code  No Known Allergies  Chief Complaint  Patient presents with  . Medical Management of Chronic Issues    Hypertension; diabetes; dyslipidemia:     HPI:  She is a 81 year old long term resident of this facility being seen for the management of her chronic illnesses: hypertension associated with diabetes; diabetes type II insulin dependent; dyslipidemia associated with type II diabetes mellitus.  She denies any chest pain; shortness of breath; no headaches; and no changes in appetite. There are no nursing concerns at this time.   Past Medical History:  Diagnosis Date  . Acute pyelonephritis   . Alzheimer's disease   . Bladder outlet obstruction   . Depression   . Diarrhea   . Environmental and seasonal allergies   . Essential hypertension, benign   . Foley catheter in place   . GERD (gastroesophageal reflux disease)   . History of urinary tract infection   . Hypertension   . Hypothyroidism   . Insomnia   . Nephrolithiasis   . Osteoarthritis   . Reflux esophagitis   . Rickets, active   . Sepsis ([place])   . Unspecified constipation   . Unspecified psychosis   . Unspecified vitamin D deficiency     Past Surgical History:  Procedure Laterality Date  . ABDOMINAL HYSTERECTOMY    . CYSTOSCOPY W/ URETERAL STENT PLACEMENT Bilateral 05/18/2015   Procedure: CYSTOSCOPY WITH RETROGRADE PYELOGRAM/URETERAL STENT PLACEMENT;  Surgeon: Carolan Clines, MD;  Location: [facility];  Service: Urology;  Laterality: Bilateral;  . CYSTOSCOPY W/ URETERAL STENT REMOVAL Bilateral 09/12/2015   Procedure: CYSTOSCOPY WITH STENT REMOVAL;  Surgeon: Carolan Clines, MD;  Location: [facility];  Service: Urology;  Laterality: Bilateral;  1 HOUR  . CYSTOSCOPY WITH RETROGRADE PYELOGRAM, URETEROSCOPY AND STENT PLACEMENT Bilateral 07/18/2015   Procedure: CYSTOSCOPY REMOVE LEFT DOUBLE J STENT LEFT RETROGRADE  PYELOGRAM, LEFT URETEROSCOPY, PYELOSCOPY, REPLACEMENT OF LEFT DOUBLE J STENT;  Surgeon: Carolan Clines, MD;  Location: [facility];  Service: Urology;  Laterality: Bilateral;  . HOLMIUM LASER APPLICATION Bilateral 7/93/9030   Procedure: HOLMIUM LASER OF LEFT LOWER URETERAL STONE, LEFT PYELOSCOPY, REPLACE LEFT DOUBLE J STENT REMOVE RIGHT DOUBLE J STENT RIGHT RETROGRADE PYELOGRAM, REPLACEMENT RIGHT DOUBLE J STENT ;  Surgeon: Carolan Clines, MD;  Location: [facility];  Service: Urology;  Laterality: Bilateral;  . KNEE ARTHROSCOPY Left 1997    Social History   Socioeconomic History  . Marital status: Married    Spouse name: Not on file  . Number of children: Not on file  . Years of education: Not on file  . Highest education level: Not on file  Social Needs  . Financial resource strain: Not on file  . Food insecurity - worry: Not on file  . Food insecurity - inability: Not on file  . Transportation needs - medical: Not on file  . Transportation needs - non-medical: Not on file  Occupational History  . Not on file  Tobacco Use  . Smoking status: Never Smoker  . Smokeless tobacco: Never Used  Substance and Sexual Activity  . Alcohol use: No  . Drug use: No  . Sexual activity: Not on file  Other Topics Concern  . Not on file  Social History Narrative  . Not on file   Family History  Family history unknown: Yes      VITAL SIGNS BP (!) 164/72  Pulse 65   Temp (!) 97 F (36.1 C)   Resp 18   Ht 4\' 11"  (1.499 m)   Wt 152 lb 3.2 oz (69 kg)   SpO2 97%   BMI 30.74 kg/m    Repeated b/p 115/67   Outpatient Encounter Medications as of 01/13/2017  Medication Sig  . acetaminophen (TYLENOL) 325 MG tablet Take 650 mg by mouth every 6 (six) hours as needed for moderate pain.  Marland Kitchen acetaminophen (TYLENOL) 500 MG tablet Take 1,000 mg by mouth daily. Pain management  . aspirin 325 MG EC tablet Take 325 mg by mouth daily.  Marland Kitchen atorvastatin (LIPITOR) 10 MG tablet Take 10 mg by mouth daily  at 6 PM.  . cholecalciferol (VITAMIN D) 1000 UNITS tablet Take 2,000 Units by mouth daily.  . Insulin Detemir (LEVEMIR FLEXPEN) 100 UNIT/ML Pen Inject 12 Units into the skin every morning. And 12 units subcutaneously in the evening  . linaclotide (LINZESS) 72 MCG capsule Take 72 mcg by mouth daily.  Marland Kitchen lisinopril (PRINIVIL,ZESTRIL) 5 MG tablet Take 5 mg by mouth daily.   Marland Kitchen loratadine (CLARITIN) 10 MG tablet Take 10 mg by mouth daily as needed for allergies.   . metoprolol tartrate (LOPRESSOR) 25 MG tablet Take 25 mg by mouth 2 (two) times daily. For HTN  Hold if B/P < 100/60 or HR < 60  . nitrofurantoin, macrocrystal-monohydrate, (MACROBID) 100 MG capsule Take 1 capsule (100 mg total) by mouth at bedtime.  . Omega-3 Fatty Acids (FISH OIL OMEGA-3 PO) Give 1 capsule by mouth every morning  . ondansetron (ZOFRAN) 4 MG tablet Take 4 mg by mouth every 6 (six) hours as needed for nausea or vomiting.   Marland Kitchen oxyCODONE-acetaminophen (PERCOCET/ROXICET) 5-325 MG tablet Take 1 tablet by mouth every 6 (six) hours.  . phenazopyridine (PYRIDIUM) 200 MG tablet Take 200 mg by mouth every 8 (eight) hours as needed for pain.  . Polyvinyl Alcohol-Povidone (FRESHKOTE) 2.7-2 % SOLN Apply 1 drop to eye 3 (three) times daily. To both eyes   No facility-administered encounter medications on file as of 01/13/2017.      SIGNIFICANT DIAGNOSTIC EXAMS  PREVIOUS   05-18-15: ct of abdomen and pelvis: 1. Motion and hardware degraded images. 2. Moderate left-sided hydroureteronephrosis secondary to a distal left ureteric 11 x 13 mm calculus. Decreased left-sided renal function. 3. Large burden bilateral renal collecting system stones, including stones within both extrarenal pelves. 4. Cirrhosis with small volume ascites. 5. Proctitis, likely related to C. difficile colitis, given the clinical history. 6. Bladder wall irregularity which could relate to cystitis. Bladder diverticulum, suggesting a component of outlet  obstruction. 7. Skin irregularity superficial to the coccyx. Recommend physical exam correlation to exclude decubitus ulcer. No gross osseous destruction.   NO NEW EXAMS     LABS REVIEWED: PREVIOUS  03-31-16: wbc 18.7; hgb 13.9; hct 41.8; mcv 95.7; plt 310; glucose 160; bun 17.2; creat 0.74; k+ 4.1; na++ 138; liver normal albumin 4.2 04-01-16: urine culture: providencia stuartii 04-02-16: glucose 110; bun 30.5; creat 0.99; k+ 4.0; na++ 135; ca 9.3; liver normal albumin 3.5  05-14-16: tsh 2.18; hgb a1c 5.9; chol 136; ldl 48; trig 248; hdl 39  08-11-16: urine micro-albumin 1.5  09-14-16: chol 142; ldl 60; trig 228; hdl 36 hgb a1c 6.0  09-28-16: wbc 7.5; hgb 11.5; hct 35.1; mcv 102.4; plt 355 glucose 208; bun 36.1; creat 0.92; k+ 5.8; na++ 139; ca 9.5; liver normal albumin 3.5  NO NEW LABS  Review of Systems  Constitutional: Negative for malaise/fatigue.  Respiratory: Negative for cough and shortness of breath.   Cardiovascular: Negative for chest pain, palpitations and leg swelling.  Gastrointestinal: Negative for abdominal pain, constipation and heartburn.  Musculoskeletal: Negative for back pain, joint pain and myalgias.  Skin: Negative.   Neurological: Negative for dizziness.  Psychiatric/Behavioral: The patient is not nervous/anxious.     Physical Exam  Constitutional: She appears well-developed and well-nourished. No distress.  Neck: No thyromegaly present.  Cardiovascular: Normal rate, regular rhythm and intact distal pulses.  Murmur heard. 1/6  Pulmonary/Chest: Effort normal and breath sounds normal. No respiratory distress.  Abdominal: Soft. Bowel sounds are normal. She exhibits no distension. There is no tenderness.  Musculoskeletal: She exhibits no edema.  Is able to move all extremities   Lymphadenopathy:    She has no cervical adenopathy.  Neurological: She is alert.  Skin: Skin is warm and dry. She is not diaphoretic.  Psychiatric: She has a normal mood and affect.    .   ASSESSMENT/ PLAN:  TODAY:   1.  Hypertension:b/p 115/67 is stable; will continue lisinopril 5 mg daily; and lopressor 25 mg twice daily  Asa 325 mg daily    2. Diabetes:  hgb a1c is 5.9  She is presently stable her cbgs are good ; will continue  lantus  12 units every 12 hours  She is on a statin and ace and asa   3. Dyslipidemia: ldl is 48; is stable will continue lipitor 10 mg daily   PREVIOUS PROBLEMS:   4.  Alzheimer's disease: no change in status; is presently not taking medications will not make changes will monitor her status.  Her current weight is 152 pounds. She is slowly losing weight over time; which is an unfortunate but expected out come of this disease process.   5.  Constipation:stable will continue linzess 72 mcg daily   6. Osteoarthritis: is stable  pain is presently being managed; will continue tylenol 1 gm daily  7. Depression with psychosis: is presently stable is presently not taking medications; will monitor   8. Nephrolithiasis; outlet obstruction: is on long term macrobid daily  is followed by urology.   9. Weight loss: her current weight is 152 pounds; her weight in April 2018: 162 pounds. Will continue supplements per facility protocol.   MD is aware of resident's narcotic use and is in agreement with current plan of care. We will attempt to wean resident as apropriate     Ok Edwards NP [facility]  Contact [phone] Monday through Friday 8am- 5pm  After hours call [phone] '

## 2017-02-11 ENCOUNTER — Other Ambulatory Visit: Payer: Self-pay

## 2017-02-11 ENCOUNTER — Non-Acute Institutional Stay (SKILLED_NURSING_FACILITY): Payer: Medicare Other | Admitting: Adult Health

## 2017-02-11 ENCOUNTER — Encounter: Payer: Self-pay | Admitting: Adult Health

## 2017-02-11 DIAGNOSIS — F323 Major depressive disorder, single episode, severe with psychotic features: Secondary | ICD-10-CM | POA: Diagnosis not present

## 2017-02-11 DIAGNOSIS — M150 Primary generalized (osteo)arthritis: Secondary | ICD-10-CM

## 2017-02-11 DIAGNOSIS — F0280 Dementia in other diseases classified elsewhere without behavioral disturbance: Secondary | ICD-10-CM

## 2017-02-11 DIAGNOSIS — M159 Polyosteoarthritis, unspecified: Secondary | ICD-10-CM

## 2017-02-11 DIAGNOSIS — K5909 Other constipation: Secondary | ICD-10-CM | POA: Diagnosis not present

## 2017-02-11 DIAGNOSIS — G301 Alzheimer's disease with late onset: Secondary | ICD-10-CM | POA: Diagnosis not present

## 2017-02-11 MED ORDER — OXYCODONE-ACETAMINOPHEN 5-325 MG PO TABS: 1.0000 | ORAL_TABLET | Freq: Four times a day (QID) | ORAL | 0 refills | Status: DC

## 2017-02-11 NOTE — Progress Notes (Signed)
Location:   [facility] Room Number: 115 B Place of Service:  SNF (31)   CODE STATUS: Full Code  No Known Allergies  Chief Complaint  Patient presents with  . Medical Management of Chronic Issues    Constipation; Alzheimer's disease; osteoarthritis; depression     HPI:  She is a 82 year old long term resident of this facility being seen for the management of her chronic illnesses: constipation; Alzheimer's disease; osteoarthritis; depression. She denies any feelings of sadness; or agitation. She denies any constipation; no changes in appetite; no complaints of pain. There are no nursing concerns at this time.   Past Medical History:  Diagnosis Date  . Acute pyelonephritis   . Alzheimer's disease   . Bladder outlet obstruction   . Depression   . Diarrhea   . Environmental and seasonal allergies   . Essential hypertension, benign   . Foley catheter in place   . GERD (gastroesophageal reflux disease)   . History of urinary tract infection   . Hypertension   . Hypothyroidism   . Insomnia   . Nephrolithiasis   . Osteoarthritis   . Reflux esophagitis   . Rickets, active   . Sepsis ([place])   . Unspecified constipation   . Unspecified psychosis   . Unspecified vitamin D deficiency     Past Surgical History:  Procedure Laterality Date  . ABDOMINAL HYSTERECTOMY    . CYSTOSCOPY W/ URETERAL STENT PLACEMENT Bilateral 05/18/2015   Procedure: CYSTOSCOPY WITH RETROGRADE PYELOGRAM/URETERAL STENT PLACEMENT;  Surgeon: Carolan Clines, MD;  Location: [facility];  Service: Urology;  Laterality: Bilateral;  . CYSTOSCOPY W/ URETERAL STENT REMOVAL Bilateral 09/12/2015   Procedure: CYSTOSCOPY WITH STENT REMOVAL;  Surgeon: Carolan Clines, MD;  Location: [facility];  Service: Urology;  Laterality: Bilateral;  1 HOUR  . CYSTOSCOPY WITH RETROGRADE PYELOGRAM, URETEROSCOPY AND STENT PLACEMENT Bilateral 07/18/2015   Procedure: CYSTOSCOPY REMOVE LEFT DOUBLE J STENT LEFT RETROGRADE  PYELOGRAM, LEFT URETEROSCOPY, PYELOSCOPY, REPLACEMENT OF LEFT DOUBLE J STENT;  Surgeon: Carolan Clines, MD;  Location: [facility];  Service: Urology;  Laterality: Bilateral;  . HOLMIUM LASER APPLICATION Bilateral 8/84/1660   Procedure: HOLMIUM LASER OF LEFT LOWER URETERAL STONE, LEFT PYELOSCOPY, REPLACE LEFT DOUBLE J STENT REMOVE RIGHT DOUBLE J STENT RIGHT RETROGRADE PYELOGRAM, REPLACEMENT RIGHT DOUBLE J STENT ;  Surgeon: Carolan Clines, MD;  Location: [facility];  Service: Urology;  Laterality: Bilateral;  . KNEE ARTHROSCOPY Left 1997    Social History   Socioeconomic History  . Marital status: Married    Spouse name: Not on file  . Number of children: Not on file  . Years of education: Not on file  . Highest education level: Not on file  Social Needs  . Financial resource strain: Not on file  . Food insecurity - worry: Not on file  . Food insecurity - inability: Not on file  . Transportation needs - medical: Not on file  . Transportation needs - non-medical: Not on file  Occupational History  . Not on file  Tobacco Use  . Smoking status: Never Smoker  . Smokeless tobacco: Never Used  Substance and Sexual Activity  . Alcohol use: No  . Drug use: No  . Sexual activity: Not on file  Other Topics Concern  . Not on file  Social History Narrative  . Not on file   Family History  Family history unknown: Yes      VITAL SIGNS BP 119/72   Pulse 81   Temp (!)  96.8 F (36 C)   Resp 17   Ht 4\' 11"  (1.499 m)   Wt 152 lb 3.2 oz (69 kg)   SpO2 98%   BMI 30.74 kg/m   Outpatient Encounter Medications as of 02/11/2017  Medication Sig  . acetaminophen (TYLENOL) 325 MG tablet Take 650 mg by mouth every 6 (six) hours as needed for moderate pain.  Marland Kitchen acetaminophen (TYLENOL) 500 MG tablet Take 1,000 mg by mouth daily. Pain management  . aspirin 325 MG EC tablet Take 325 mg by mouth daily.  Marland Kitchen atorvastatin (LIPITOR) 10 MG tablet Take 10 mg by mouth daily at 6 PM.  . cholecalciferol  (VITAMIN D) 1000 UNITS tablet Take 2,000 Units by mouth daily.  . Insulin Detemir (LEVEMIR FLEXPEN) 100 UNIT/ML Pen Inject 12 Units into the skin every morning. And 12 units subcutaneously in the evening  . linaclotide (LINZESS) 72 MCG capsule Take 72 mcg by mouth daily.  Marland Kitchen lisinopril (PRINIVIL,ZESTRIL) 5 MG tablet Take 5 mg by mouth daily.   Marland Kitchen loratadine (CLARITIN) 10 MG tablet Take 10 mg by mouth daily as needed for allergies.   . metoprolol tartrate (LOPRESSOR) 25 MG tablet Take 25 mg by mouth 2 (two) times daily. For HTN  Hold if B/P < 100/60 or HR < 60  . nitrofurantoin, macrocrystal-monohydrate, (MACROBID) 100 MG capsule Take 1 capsule (100 mg total) by mouth at bedtime.  . Omega-3 Fatty Acids (FISH OIL OMEGA-3 PO) Give 1 capsule by mouth every morning  . ondansetron (ZOFRAN) 4 MG tablet Take 4 mg by mouth every 6 (six) hours as needed for nausea or vomiting.   Marland Kitchen oxyCODONE-acetaminophen (PERCOCET/ROXICET) 5-325 MG tablet Take 1 tablet by mouth every 6 (six) hours.  . phenazopyridine (PYRIDIUM) 200 MG tablet Take 200 mg by mouth every 8 (eight) hours as needed for pain.  . Polyvinyl Alcohol-Povidone (FRESHKOTE) 2.7-2 % SOLN Apply 1 drop to eye 3 (three) times daily. To both eyes   No facility-administered encounter medications on file as of 02/11/2017.      SIGNIFICANT DIAGNOSTIC EXAMS  PREVIOUS   05-18-15: ct of abdomen and pelvis: 1. Motion and hardware degraded images. 2. Moderate left-sided hydroureteronephrosis secondary to a distal left ureteric 11 x 13 mm calculus. Decreased left-sided renal function. 3. Large burden bilateral renal collecting system stones, including stones within both extrarenal pelves. 4. Cirrhosis with small volume ascites. 5. Proctitis, likely related to C. difficile colitis, given the clinical history. 6. Bladder wall irregularity which could relate to cystitis. Bladder diverticulum, suggesting a component of outlet obstruction. 7. Skin irregularity  superficial to the coccyx. Recommend physical exam correlation to exclude decubitus ulcer. No gross osseous destruction.   NO NEW EXAMS     LABS REVIEWED: PREVIOUS  03-31-16: wbc 18.7; hgb 13.9; hct 41.8; mcv 95.7; plt 310; glucose 160; bun 17.2; creat 0.74; k+ 4.1; na++ 138; liver normal albumin 4.2 04-01-16: urine culture: providencia stuartii 04-02-16: glucose 110; bun 30.5; creat 0.99; k+ 4.0; na++ 135; ca 9.3; liver normal albumin 3.5  05-14-16: tsh 2.18; hgb a1c 5.9; chol 136; ldl 48; trig 248; hdl 39  08-11-16: urine micro-albumin 1.5  09-14-16: chol 142; ldl 60; trig 228; hdl 36 hgb a1c 6.0  09-28-16: wbc 7.5; hgb 11.5; hct 35.1; mcv 102.4; plt 355 glucose 208; bun 36.1; creat 0.92; k+ 5.8; na++ 139; ca 9.5; liver normal albumin 3.5  TODAY:   11-16-16: glucose 97; bun 18.2; creat 0.77; k+ 5.0; na++ 136; ca 9.3  Review of Systems  Constitutional: Negative for malaise/fatigue.  Respiratory: Negative for cough and shortness of breath.   Cardiovascular: Negative for chest pain, palpitations and leg swelling.  Gastrointestinal: Negative for abdominal pain, constipation and heartburn.  Musculoskeletal: Negative for back pain, joint pain and myalgias.  Skin: Negative.   Neurological: Negative for dizziness.  Psychiatric/Behavioral: The patient is not nervous/anxious.     Physical Exam  Constitutional: She appears well-developed and well-nourished. No distress.  Neck: No thyromegaly present.  Cardiovascular: Normal rate, regular rhythm and intact distal pulses.  Murmur heard. 1/6  Pulmonary/Chest: Effort normal and breath sounds normal. No respiratory distress.  Abdominal: Soft. Bowel sounds are normal. She exhibits no distension. There is no tenderness.  Musculoskeletal: She exhibits no edema.  Is able to move all extremities   Lymphadenopathy:    She has no cervical adenopathy.  Neurological: She is alert.  Skin: Skin is warm and dry. She is not diaphoretic.  Psychiatric:  She has a normal mood and affect.   .   ASSESSMENT/ PLAN:  TODAY:   1.  Alzheimer's disease, late onset without behavioral disturbance: no change in status; is presently not taking medications will not make changes will monitor her status.  Her current weight is 152 pounds. She is slowly losing weight over time; which is an unfortunate but expected out come of this disease process.   2.  Chronic constipation:stable will continue linzess 72 mcg daily   3. Osteoarthritis, primary multiple joints: is stable  pain is presently being managed; will continue tylenol 1 gm daily  4. Major depression with psychotic features: is presently stable is presently not taking medications; will monitor    PREVIOUS PROBLEMS:   5. Nephrolithiasis; outlet obstruction: is on long term macrobid daily  is followed by urology.   6. Weight loss: her current weight is 152 pounds; her weight in April 2018: 162 pounds. Will continue supplements per facility protocol.   7.  Hypertension:b/p 119/72 is stable; will continue lisinopril 5 mg daily; and lopressor 25 mg twice daily  Asa 325 mg daily    8. Diabetes:  hgb a1c is 5.9  She is presently stable her cbgs are good ; will continue  lantus  12 units every 12 hours  She is on a statin and ace and asa   9. Dyslipidemia: ldl is 48; is stable will continue lipitor 10 mg daily   MD is aware of resident's narcotic use and is in agreement with current plan of care. We will attempt to wean resident as apropriate    Ok Edwards NP [facility]  Contact [phone] Monday through Friday 8am- 5pm  After hours call [phone]

## 2017-02-11 NOTE — Telephone Encounter (Signed)
RX faxed to [facility] @ [phone], phone number [phone] 

## 2017-02-14 DIAGNOSIS — I10 Essential (primary) hypertension: Secondary | ICD-10-CM | POA: Diagnosis not present

## 2017-02-14 DIAGNOSIS — E039 Hypothyroidism, unspecified: Secondary | ICD-10-CM | POA: Diagnosis not present

## 2017-02-14 DIAGNOSIS — Z79899 Other long term (current) drug therapy: Secondary | ICD-10-CM | POA: Diagnosis not present

## 2017-02-14 DIAGNOSIS — E119 Type 2 diabetes mellitus without complications: Secondary | ICD-10-CM | POA: Diagnosis not present

## 2017-02-14 DIAGNOSIS — E785 Hyperlipidemia, unspecified: Secondary | ICD-10-CM | POA: Diagnosis not present

## 2017-02-14 LAB — LIPID PANEL
Cholesterol: 127 (ref 0–200)
HDL: 34 — AB (ref 35–70)
LDL CALC: 64
Triglycerides: 143 (ref 40–160)

## 2017-02-14 LAB — BASIC METABOLIC PANEL
BUN: 24 — AB (ref 4–21)
CREATININE: 0.9 (ref 0.5–1.1)
GLUCOSE: 143
Potassium: 5.1 (ref 3.4–5.3)
SODIUM: 140 (ref 137–147)

## 2017-02-14 LAB — HEPATIC FUNCTION PANEL
ALK PHOS: 76 (ref 25–125)
ALT: 11 (ref 7–35)
AST: 11 — AB (ref 13–35)
BILIRUBIN, TOTAL: 0.3

## 2017-02-14 LAB — HEMOGLOBIN A1C: HEMOGLOBIN A1C: 6.7

## 2017-02-14 LAB — TSH: TSH: 1.01 (ref 0.41–5.90)

## 2017-03-14 ENCOUNTER — Encounter: Payer: Self-pay | Admitting: Adult Health

## 2017-03-14 ENCOUNTER — Non-Acute Institutional Stay (SKILLED_NURSING_FACILITY): Payer: Medicare Other | Admitting: Adult Health

## 2017-03-14 DIAGNOSIS — N200 Calculus of kidney: Secondary | ICD-10-CM

## 2017-03-14 DIAGNOSIS — I152 Hypertension secondary to endocrine disorders: Secondary | ICD-10-CM

## 2017-03-14 DIAGNOSIS — E1159 Type 2 diabetes mellitus with other circulatory complications: Secondary | ICD-10-CM

## 2017-03-14 DIAGNOSIS — I10 Essential (primary) hypertension: Secondary | ICD-10-CM | POA: Diagnosis not present

## 2017-03-14 DIAGNOSIS — Z794 Long term (current) use of insulin: Secondary | ICD-10-CM

## 2017-03-14 DIAGNOSIS — R634 Abnormal weight loss: Secondary | ICD-10-CM

## 2017-03-14 DIAGNOSIS — F323 Major depressive disorder, single episode, severe with psychotic features: Secondary | ICD-10-CM | POA: Insufficient documentation

## 2017-03-14 DIAGNOSIS — E119 Type 2 diabetes mellitus without complications: Secondary | ICD-10-CM

## 2017-03-14 NOTE — Progress Notes (Signed)
Location:   [facility] Room Number: 115 Place of Service:  SNF (31)   CODE STATUS: full code   No Known Allergies  Chief Complaint  Patient presents with  . Medical Management of Chronic Issues    Hypertension; diabetes; weight loss; nephrolithiasis     HPI:  She is a 82 year old long term resident of this facility being seen for the management her chronic illnesses: hypertension; diabetes; weight loss; nephrolithiasis. She denies any chest pain; headaches; no dysuria. There are no nursing concerns at this time.   Past Medical History:  Diagnosis Date  . Acute pyelonephritis   . Alzheimer's disease   . Bladder outlet obstruction   . Depression   . Diarrhea   . Environmental and seasonal allergies   . Essential hypertension, benign   . Foley catheter in place   . GERD (gastroesophageal reflux disease)   . History of urinary tract infection   . Hypertension   . Hypothyroidism   . Insomnia   . Nephrolithiasis   . Osteoarthritis   . Reflux esophagitis   . Rickets, active   . Sepsis ([place])   . Unspecified constipation   . Unspecified psychosis   . Unspecified vitamin D deficiency     Past Surgical History:  Procedure Laterality Date  . ABDOMINAL HYSTERECTOMY    . CYSTOSCOPY W/ URETERAL STENT PLACEMENT Bilateral 05/18/2015   Procedure: CYSTOSCOPY WITH RETROGRADE PYELOGRAM/URETERAL STENT PLACEMENT;  Surgeon: Carolan Clines, MD;  Location: [facility];  Service: Urology;  Laterality: Bilateral;  . CYSTOSCOPY W/ URETERAL STENT REMOVAL Bilateral 09/12/2015   Procedure: CYSTOSCOPY WITH STENT REMOVAL;  Surgeon: Carolan Clines, MD;  Location: [facility];  Service: Urology;  Laterality: Bilateral;  1 HOUR  . CYSTOSCOPY WITH RETROGRADE PYELOGRAM, URETEROSCOPY AND STENT PLACEMENT Bilateral 07/18/2015   Procedure: CYSTOSCOPY REMOVE LEFT DOUBLE J STENT LEFT RETROGRADE PYELOGRAM, LEFT URETEROSCOPY, PYELOSCOPY, REPLACEMENT OF LEFT DOUBLE J STENT;  Surgeon: Carolan Clines, MD;  Location: [facility];  Service: Urology;  Laterality: Bilateral;  . HOLMIUM LASER APPLICATION Bilateral 8/75/6433   Procedure: HOLMIUM LASER OF LEFT LOWER URETERAL STONE, LEFT PYELOSCOPY, REPLACE LEFT DOUBLE J STENT REMOVE RIGHT DOUBLE J STENT RIGHT RETROGRADE PYELOGRAM, REPLACEMENT RIGHT DOUBLE J STENT ;  Surgeon: Carolan Clines, MD;  Location: [facility];  Service: Urology;  Laterality: Bilateral;  . KNEE ARTHROSCOPY Left 1997    Social History   Socioeconomic History  . Marital status: Married    Spouse name: Not on file  . Number of children: Not on file  . Years of education: Not on file  . Highest education level: Not on file  Social Needs  . Financial resource strain: Not on file  . Food insecurity - worry: Not on file  . Food insecurity - inability: Not on file  . Transportation needs - medical: Not on file  . Transportation needs - non-medical: Not on file  Occupational History  . Not on file  Tobacco Use  . Smoking status: Never Smoker  . Smokeless tobacco: Never Used  Substance and Sexual Activity  . Alcohol use: No  . Drug use: No  . Sexual activity: Not on file  Other Topics Concern  . Not on file  Social History Narrative  . Not on file   Family History  Family history unknown: Yes      VITAL SIGNS BP 125/76   Pulse 70   Temp (!) 97 F (36.1 C)   Resp 18  Ht 4\' 11"  (1.499 m)   Wt 159 lb (72.1 kg)   SpO2 98%   BMI 32.11 kg/m   Outpatient Encounter Medications as of 03/14/2017  Medication Sig  . acetaminophen (TYLENOL) 325 MG tablet Take 650 mg by mouth every 6 (six) hours as needed for moderate pain.  Marland Kitchen acetaminophen (TYLENOL) 500 MG tablet Take 1,000 mg by mouth daily. Pain management  . aspirin 325 MG EC tablet Take 325 mg by mouth daily.  Marland Kitchen atorvastatin (LIPITOR) 10 MG tablet Take 10 mg by mouth daily at 6 PM.  . cholecalciferol (VITAMIN D) 1000 UNITS tablet Take 2,000 Units by mouth daily.  . Insulin Detemir (LEVEMIR FLEXPEN)  100 UNIT/ML Pen Inject 12 Units into the skin every morning. And 12 units subcutaneously in the evening  . linaclotide (LINZESS) 72 MCG capsule Take 72 mcg by mouth daily.  Marland Kitchen lisinopril (PRINIVIL,ZESTRIL) 5 MG tablet Take 5 mg by mouth daily.   Marland Kitchen loratadine (CLARITIN) 10 MG tablet Take 10 mg by mouth daily as needed for allergies.   . metoprolol tartrate (LOPRESSOR) 25 MG tablet Take 25 mg by mouth 2 (two) times daily. For HTN  Hold if B/P < 100/60 or HR < 60  . nitrofurantoin, macrocrystal-monohydrate, (MACROBID) 100 MG capsule Take 1 capsule (100 mg total) by mouth at bedtime.  . Omega-3 Fatty Acids (FISH OIL OMEGA-3 PO) Give 1 capsule by mouth every morning  . ondansetron (ZOFRAN) 4 MG tablet Take 4 mg by mouth every 6 (six) hours as needed for nausea or vomiting.   Marland Kitchen oxyCODONE-acetaminophen (PERCOCET/ROXICET) 5-325 MG tablet Take 1 tablet by mouth every 6 (six) hours.  . phenazopyridine (PYRIDIUM) 200 MG tablet Take 200 mg by mouth every 8 (eight) hours as needed for pain.  . Polyvinyl Alcohol-Povidone (FRESHKOTE) 2.7-2 % SOLN Apply 1 drop to eye 3 (three) times daily. To both eyes   No facility-administered encounter medications on file as of 03/14/2017.      SIGNIFICANT DIAGNOSTIC EXAMS  PREVIOUS   05-18-15: ct of abdomen and pelvis: 1. Motion and hardware degraded images. 2. Moderate left-sided hydroureteronephrosis secondary to a distal left ureteric 11 x 13 mm calculus. Decreased left-sided renal function. 3. Large burden bilateral renal collecting system stones, including stones within both extrarenal pelves. 4. Cirrhosis with small volume ascites. 5. Proctitis, likely related to C. difficile colitis, given the clinical history. 6. Bladder wall irregularity which could relate to cystitis. Bladder diverticulum, suggesting a component of outlet obstruction. 7. Skin irregularity superficial to the coccyx. Recommend physical exam correlation to exclude decubitus ulcer. No gross osseous  destruction.   NO NEW EXAMS     LABS REVIEWED: PREVIOUS  03-31-16: wbc 18.7; hgb 13.9; hct 41.8; mcv 95.7; plt 310; glucose 160; bun 17.2; creat 0.74; k+ 4.1; na++ 138; liver normal albumin 4.2 04-01-16: urine culture: providencia stuartii 04-02-16: glucose 110; bun 30.5; creat 0.99; k+ 4.0; na++ 135; ca 9.3; liver normal albumin 3.5  05-14-16: tsh 2.18; hgb a1c 5.9; chol 136; ldl 48; trig 248; hdl 39  08-11-16: urine micro-albumin 1.5  09-14-16: chol 142; ldl 60; trig 228; hdl 36 hgb a1c 6.0  09-28-16: wbc 7.5; hgb 11.5; hct 35.1; mcv 102.4; plt 355 glucose 208; bun 36.1; creat 0.92; k+ 5.8; na++ 139; ca 9.5; liver normal albumin 3.5 11-16-16: glucose 97; bun 18.2; creat 0.77; k+ 5.0; na++ 136; ca 9.3  TODAY;   02-14-17: glucose 143; bun 25.9; creat 0.91; k+ 5.1; na++ 140; ca 9.6; liver normal albumin  4.0; tsh 1.01; hgb a1c 6.7; chol 127; ldl 64; trig 143; hdl 34       Review of Systems  Constitutional: Negative for malaise/fatigue.  Respiratory: Negative for cough and shortness of breath.   Cardiovascular: Negative for chest pain, palpitations and leg swelling.  Gastrointestinal: Negative for abdominal pain, constipation and heartburn.  Musculoskeletal: Negative for back pain, joint pain and myalgias.  Skin: Negative.   Neurological: Negative for dizziness.  Psychiatric/Behavioral: The patient is not nervous/anxious.     Physical Exam  Constitutional: She appears well-developed and well-nourished. No distress.  Neck: No thyromegaly present.  Cardiovascular: Normal rate, regular rhythm and intact distal pulses.  Murmur heard. 1/6  Pulmonary/Chest: Effort normal and breath sounds normal. No respiratory distress.  Abdominal: Soft. Bowel sounds are normal. She exhibits no distension.  Musculoskeletal: Normal range of motion. She exhibits no edema.  Neurological: She is alert.  Skin: Skin is warm and dry. She is not diaphoretic.  Psychiatric: She has a normal mood and affect.  .    ASSESSMENT/ PLAN:  TODAY:   1. Nephrolithiasis; outlet obstruction: is on long term macrobid daily  is followed by urology.   2. Weight loss, non intentional : her current weight is 159 pounds; her weight in April 2018: 162 pounds. Will continue supplements per facility protocol.   3.  Hypertension associated with diabetes :b/p 125/76 is stable; will continue lisinopril 5 mg daily; and lopressor 25 mg twice daily  Asa 325 mg daily    4. Diabetes mellitus type II insulin dependent:  hgb a1c is 6.7  She is presently stable her cbgs are good ; will continue  lantus  12 units every 12 hours  She is on a statin and ace and asa    PREVIOUS PROBLEMS:   5. Dyslipidemia: ldl is 64 ; is stable will continue lipitor 10 mg daily   6.  Alzheimer's disease, late onset without behavioral disturbance: no change in status; is presently not taking medications will not make changes will monitor her status.  Her current weight is 159 pounds; which is presently stable will monitor   7.  Chronic constipation:stable will continue linzess 72 mcg daily   8. Osteoarthritis, primary multiple joints: is stable  pain is presently being managed; will continue tylenol 1 gm daily and percocet 5/325 mg every 6 hours routinely   9. Major depression with psychotic features: is presently stable is presently not taking medications; will monitor     MD is aware of resident's narcotic use and is in agreement with current plan of care. We will attempt to wean resident as apropriate   Ok Edwards NP [facility]  Contact [phone] Monday through Friday 8am- 5pm  After hours call [phone]

## 2017-03-31 DIAGNOSIS — R262 Difficulty in walking, not elsewhere classified: Secondary | ICD-10-CM | POA: Diagnosis not present

## 2017-03-31 DIAGNOSIS — E1051 Type 1 diabetes mellitus with diabetic peripheral angiopathy without gangrene: Secondary | ICD-10-CM | POA: Diagnosis not present

## 2017-03-31 DIAGNOSIS — B351 Tinea unguium: Secondary | ICD-10-CM | POA: Diagnosis not present

## 2017-04-05 DIAGNOSIS — L89152 Pressure ulcer of sacral region, stage 2: Secondary | ICD-10-CM | POA: Diagnosis not present

## 2017-04-11 ENCOUNTER — Non-Acute Institutional Stay: Payer: Medicare Other | Admitting: Adult Health

## 2017-04-11 ENCOUNTER — Encounter: Payer: Self-pay | Admitting: Adult Health

## 2017-04-11 DIAGNOSIS — M159 Polyosteoarthritis, unspecified: Secondary | ICD-10-CM

## 2017-04-11 DIAGNOSIS — E785 Hyperlipidemia, unspecified: Secondary | ICD-10-CM | POA: Diagnosis not present

## 2017-04-11 DIAGNOSIS — F0280 Dementia in other diseases classified elsewhere without behavioral disturbance: Secondary | ICD-10-CM

## 2017-04-11 DIAGNOSIS — M150 Primary generalized (osteo)arthritis: Secondary | ICD-10-CM

## 2017-04-11 DIAGNOSIS — K5909 Other constipation: Secondary | ICD-10-CM | POA: Diagnosis not present

## 2017-04-11 DIAGNOSIS — E1169 Type 2 diabetes mellitus with other specified complication: Secondary | ICD-10-CM | POA: Diagnosis not present

## 2017-04-11 DIAGNOSIS — G301 Alzheimer's disease with late onset: Secondary | ICD-10-CM | POA: Diagnosis not present

## 2017-04-11 NOTE — Progress Notes (Signed)
Location:   [facility] Room Number: 115 B Place of Service:  SNF (31)   CODE STATUS: Full Code (Most form updated 07/29/15)  No Known Allergies  Chief Complaint  Patient presents with  . Medical Management of Chronic Issues    Dyslipidemia; alzheimer's disease; constipation; osteoarthritis     HPI:  She is a 82 year old long term resident of this facility being seen for the management of her chronic illnesses; dyslipidemia; alzheimer's disease, constipation; osteoarthritis. She denies back or joint pain; no complaints of constipation; on changes in behaviors. There are no nursing concerns at this time.     Past Medical History:  Diagnosis Date  . Acute pyelonephritis   . Alzheimer's disease   . Bladder outlet obstruction   . Depression   . Diarrhea   . Environmental and seasonal allergies   . Essential hypertension, benign   . Foley catheter in place   . GERD (gastroesophageal reflux disease)   . History of urinary tract infection   . Hypertension   . Hypothyroidism   . Insomnia   . Nephrolithiasis   . Osteoarthritis   . Reflux esophagitis   . Rickets, active   . Sepsis ([place])   . Unspecified constipation   . Unspecified psychosis   . Unspecified vitamin D deficiency     Past Surgical History:  Procedure Laterality Date  . ABDOMINAL HYSTERECTOMY    . CYSTOSCOPY W/ URETERAL STENT PLACEMENT Bilateral 05/18/2015   Procedure: CYSTOSCOPY WITH RETROGRADE PYELOGRAM/URETERAL STENT PLACEMENT;  Surgeon: Carolan Clines, MD;  Location: [facility];  Service: Urology;  Laterality: Bilateral;  . CYSTOSCOPY W/ URETERAL STENT REMOVAL Bilateral 09/12/2015   Procedure: CYSTOSCOPY WITH STENT REMOVAL;  Surgeon: Carolan Clines, MD;  Location: [facility];  Service: Urology;  Laterality: Bilateral;  1 HOUR  . CYSTOSCOPY WITH RETROGRADE PYELOGRAM, URETEROSCOPY AND STENT PLACEMENT Bilateral 07/18/2015   Procedure: CYSTOSCOPY REMOVE LEFT DOUBLE J STENT LEFT RETROGRADE  PYELOGRAM, LEFT URETEROSCOPY, PYELOSCOPY, REPLACEMENT OF LEFT DOUBLE J STENT;  Surgeon: Carolan Clines, MD;  Location: [facility];  Service: Urology;  Laterality: Bilateral;  . HOLMIUM LASER APPLICATION Bilateral 02/03/7251   Procedure: HOLMIUM LASER OF LEFT LOWER URETERAL STONE, LEFT PYELOSCOPY, REPLACE LEFT DOUBLE J STENT REMOVE RIGHT DOUBLE J STENT RIGHT RETROGRADE PYELOGRAM, REPLACEMENT RIGHT DOUBLE J STENT ;  Surgeon: Carolan Clines, MD;  Location: [facility];  Service: Urology;  Laterality: Bilateral;  . KNEE ARTHROSCOPY Left 1997    Social History   Socioeconomic History  . Marital status: Married    Spouse name: Not on file  . Number of children: Not on file  . Years of education: Not on file  . Highest education level: Not on file  Social Needs  . Financial resource strain: Not on file  . Food insecurity - worry: Not on file  . Food insecurity - inability: Not on file  . Transportation needs - medical: Not on file  . Transportation needs - non-medical: Not on file  Occupational History  . Not on file  Tobacco Use  . Smoking status: Never Smoker  . Smokeless tobacco: Never Used  Substance and Sexual Activity  . Alcohol use: No  . Drug use: No  . Sexual activity: Not on file  Other Topics Concern  . Not on file  Social History Narrative  . Not on file   Family History  Family history unknown: Yes      VITAL SIGNS BP (!) 152/68   Pulse 74  Temp 98.1 F (36.7 C)   Resp 18   Ht 4\' 11"  (1.499 m)   Wt 158 lb 1.6 oz (71.7 kg)   SpO2 98%   BMI 31.93 kg/m   Outpatient Encounter Medications as of 04/11/2017  Medication Sig  . acetaminophen (TYLENOL) 325 MG tablet Take 650 mg by mouth every 6 (six) hours as needed for moderate pain.  Marland Kitchen acetaminophen (TYLENOL) 500 MG tablet Take 1,000 mg by mouth daily. Pain management  . aspirin 325 MG EC tablet Take 325 mg by mouth daily.  Marland Kitchen atorvastatin (LIPITOR) 10 MG tablet Take 10 mg by mouth daily at 6 PM.  .  cholecalciferol (VITAMIN D) 1000 UNITS tablet Take 2,000 Units by mouth daily.  . Insulin Detemir (LEVEMIR FLEXPEN) 100 UNIT/ML Pen Inject 12 Units into the skin every morning. And 12 units subcutaneously in the evening  . linaclotide (LINZESS) 72 MCG capsule Take 72 mcg by mouth daily.  Marland Kitchen lisinopril (PRINIVIL,ZESTRIL) 5 MG tablet Take 5 mg by mouth daily.   Marland Kitchen loratadine (CLARITIN) 10 MG tablet Take 10 mg by mouth daily as needed for allergies.   . metoprolol tartrate (LOPRESSOR) 25 MG tablet Take 25 mg by mouth 2 (two) times daily. For HTN  Hold if B/P < 100/60 or HR < 60  . nitrofurantoin, macrocrystal-monohydrate, (MACROBID) 100 MG capsule Take 1 capsule (100 mg total) by mouth at bedtime.  . Omega-3 Fatty Acids (FISH OIL OMEGA-3 PO) Give 1 capsule by mouth every morning  . ondansetron (ZOFRAN) 4 MG tablet Take 4 mg by mouth every 6 (six) hours as needed for nausea or vomiting.   Marland Kitchen oxyCODONE-acetaminophen (PERCOCET/ROXICET) 5-325 MG tablet Take 1 tablet by mouth every 6 (six) hours.  . phenazopyridine (PYRIDIUM) 200 MG tablet Take 200 mg by mouth every 8 (eight) hours as needed for pain.  . Polyvinyl Alcohol-Povidone (FRESHKOTE) 2.7-2 % SOLN Apply 1 drop to eye 3 (three) times daily. To both eyes   No facility-administered encounter medications on file as of 04/11/2017.      SIGNIFICANT DIAGNOSTIC EXAMS  PREVIOUS   05-18-15: ct of abdomen and pelvis: 1. Motion and hardware degraded images. 2. Moderate left-sided hydroureteronephrosis secondary to a distal left ureteric 11 x 13 mm calculus. Decreased left-sided renal function. 3. Large burden bilateral renal collecting system stones, including stones within both extrarenal pelves. 4. Cirrhosis with small volume ascites. 5. Proctitis, likely related to C. difficile colitis, given the clinical history. 6. Bladder wall irregularity which could relate to cystitis. Bladder diverticulum, suggesting a component of outlet obstruction. 7. Skin  irregularity superficial to the coccyx. Recommend physical exam correlation to exclude decubitus ulcer. No gross osseous destruction.   NO NEW EXAMS     LABS REVIEWED: PREVIOUS  04-01-16: urine culture: providencia stuartii 04-02-16: glucose 110; bun 30.5; creat 0.99; k+ 4.0; na++ 135; ca 9.3; liver normal albumin 3.5  05-14-16: tsh 2.18; hgb a1c 5.9; chol 136; ldl 48; trig 248; hdl 39  08-11-16: urine micro-albumin 1.5  09-14-16: chol 142; ldl 60; trig 228; hdl 36 hgb a1c 6.0  09-28-16: wbc 7.5; hgb 11.5; hct 35.1; mcv 102.4; plt 355 glucose 208; bun 36.1; creat 0.92; k+ 5.8; na++ 139; ca 9.5; liver normal albumin 3.5 11-16-16: glucose 97; bun 18.2; creat 0.77; k+ 5.0; na++ 136; ca 9.3 02-14-17: glucose 143; bun 25.9; creat 0.91; k+ 5.1; na++ 140; ca 9.6; liver normal albumin 4.0; tsh 1.01; hgb a1c 6.7; chol 127; ldl 64; trig 143; hdl 34  NO NEW LABS       Review of Systems  Constitutional: Negative for malaise/fatigue.  Respiratory: Negative for cough and shortness of breath.   Cardiovascular: Negative for chest pain, palpitations and leg swelling.  Gastrointestinal: Negative for abdominal pain, constipation and heartburn.  Musculoskeletal: Negative for back pain, joint pain and myalgias.  Skin: Negative.   Neurological: Negative for dizziness.  Psychiatric/Behavioral: The patient is not nervous/anxious.     Physical Exam  Constitutional: She appears well-developed and well-nourished. No distress.  Neck: No thyromegaly present.  Cardiovascular: Normal rate and intact distal pulses.  Murmur heard. 1/6  Pulmonary/Chest: Effort normal and breath sounds normal.  Abdominal: Soft. Bowel sounds are normal. She exhibits no distension.  Musculoskeletal: She exhibits no edema.  Is able to move all extremities   Lymphadenopathy:    She has no cervical adenopathy.  Neurological: She is alert.  Skin: Skin is warm and dry. She is not diaphoretic.  Psychiatric: She has a normal mood and  affect.  .   ASSESSMENT/ PLAN:  TODAY:   1. Dyslipidemia associated with type 2 diabetes mellitus: ldl is 64 ; is stable will continue lipitor 10 mg daily   2.  Alzheimer's disease, late onset without behavioral disturbance: no change in status; is presently not taking medications will not make changes will monitor her status.  Her current weight is 158 pounds; which is presently stable will monitor   3.  Chronic constipation:stable will continue linzess 72 mcg daily   4. Osteoarthritis, primary multiple joints: is stable  pain is presently being managed; will continue tylenol 1 gm daily and percocet 5/325 mg every 6 hours routinely    PREVIOUS PROBLEMS:   5. Major depression with psychotic features: is presently stable is presently not taking medications; will monitor   6. Nephrolithiasis; outlet obstruction: is on long term macrobid daily  is followed by urology.   7. Weight loss, non intentional : her current weight is 158 pounds; her weight in April 2018: 162 pounds. Will continue supplements per facility protocol.   8.  Hypertension associated with diabetes :b/p 152/68 is stable; will continue lisinopril 5 mg daily; and lopressor 25 mg twice daily  Asa 325 mg daily    9. Diabetes mellitus type II insulin dependent:  hgb a1c is 6.7  She is presently stable her cbgs are good ; will continue  lantus  12 units every 12 hours  She is on a statin and ace and asa      MD is aware of resident's narcotic use and is in agreement with current plan of care. We will attempt to wean resident as apropriate   Ok Edwards NP [facility]  Contact [phone] Monday through Friday 8am- 5pm  After hours call [phone]

## 2017-04-12 DIAGNOSIS — L89323 Pressure ulcer of left buttock, stage 3: Secondary | ICD-10-CM | POA: Diagnosis not present

## 2017-04-19 DIAGNOSIS — L89323 Pressure ulcer of left buttock, stage 3: Secondary | ICD-10-CM | POA: Diagnosis not present

## 2017-04-26 DIAGNOSIS — R3981 Functional urinary incontinence: Secondary | ICD-10-CM | POA: Diagnosis not present

## 2017-04-26 DIAGNOSIS — L89309 Pressure ulcer of unspecified buttock, unspecified stage: Secondary | ICD-10-CM | POA: Diagnosis not present

## 2017-04-26 DIAGNOSIS — E1165 Type 2 diabetes mellitus with hyperglycemia: Secondary | ICD-10-CM | POA: Diagnosis not present

## 2017-04-26 DIAGNOSIS — I10 Essential (primary) hypertension: Secondary | ICD-10-CM | POA: Diagnosis not present

## 2017-04-28 ENCOUNTER — Encounter: Payer: Self-pay | Admitting: Adult Health

## 2017-04-28 ENCOUNTER — Non-Acute Institutional Stay (SKILLED_NURSING_FACILITY): Payer: Medicare Other | Admitting: Adult Health

## 2017-04-28 DIAGNOSIS — L89322 Pressure ulcer of left buttock, stage 2: Secondary | ICD-10-CM

## 2017-04-28 NOTE — Progress Notes (Signed)
Location:   [facility] Room Number: 115 B Place of Service:  SNF (31)   CODE STATUS: Full Code  No Known Allergies  Chief Complaint  Patient presents with  . Acute Visit    Wound Care    HPI:  She has a resolving stage II left buttock ulceration. There are no signs of infection present; there is no pain present. There are no fevers present. She does spend all of her time on her back and will not allow herself to be turned.    Past Medical History:  Diagnosis Date  . Acute pyelonephritis   . Alzheimer's disease   . Bladder outlet obstruction   . Depression   . Diarrhea   . Environmental and seasonal allergies   . Essential hypertension, benign   . Foley catheter in place   . GERD (gastroesophageal reflux disease)   . History of urinary tract infection   . Hypertension   . Hypothyroidism   . Insomnia   . Nephrolithiasis   . Osteoarthritis   . Reflux esophagitis   . Rickets, active   . Sepsis ([place])   . Unspecified constipation   . Unspecified psychosis   . Unspecified vitamin D deficiency     Past Surgical History:  Procedure Laterality Date  . ABDOMINAL HYSTERECTOMY    . CYSTOSCOPY W/ URETERAL STENT PLACEMENT Bilateral 05/18/2015   Procedure: CYSTOSCOPY WITH RETROGRADE PYELOGRAM/URETERAL STENT PLACEMENT;  Surgeon: Carolan Clines, MD;  Location: [facility];  Service: Urology;  Laterality: Bilateral;  . CYSTOSCOPY W/ URETERAL STENT REMOVAL Bilateral 09/12/2015   Procedure: CYSTOSCOPY WITH STENT REMOVAL;  Surgeon: Carolan Clines, MD;  Location: [facility];  Service: Urology;  Laterality: Bilateral;  1 HOUR  . CYSTOSCOPY WITH RETROGRADE PYELOGRAM, URETEROSCOPY AND STENT PLACEMENT Bilateral 07/18/2015   Procedure: CYSTOSCOPY REMOVE LEFT DOUBLE J STENT LEFT RETROGRADE PYELOGRAM, LEFT URETEROSCOPY, PYELOSCOPY, REPLACEMENT OF LEFT DOUBLE J STENT;  Surgeon: Carolan Clines, MD;  Location: [facility];  Service: Urology;  Laterality: Bilateral;  . HOLMIUM LASER  APPLICATION Bilateral 5/73/2202   Procedure: HOLMIUM LASER OF LEFT LOWER URETERAL STONE, LEFT PYELOSCOPY, REPLACE LEFT DOUBLE J STENT REMOVE RIGHT DOUBLE J STENT RIGHT RETROGRADE PYELOGRAM, REPLACEMENT RIGHT DOUBLE J STENT ;  Surgeon: Carolan Clines, MD;  Location: [facility];  Service: Urology;  Laterality: Bilateral;  . KNEE ARTHROSCOPY Left 1997    Social History   Socioeconomic History  . Marital status: Married    Spouse name: Not on file  . Number of children: Not on file  . Years of education: Not on file  . Highest education level: Not on file  Occupational History  . Not on file  Social Needs  . Financial resource strain: Not on file  . Food insecurity:    Worry: Not on file    Inability: Not on file  . Transportation needs:    Medical: Not on file    Non-medical: Not on file  Tobacco Use  . Smoking status: Never Smoker  . Smokeless tobacco: Never Used  Substance and Sexual Activity  . Alcohol use: No  . Drug use: No  . Sexual activity: Not on file  Lifestyle  . Physical activity:    Days per week: Not on file    Minutes per session: Not on file  . Stress: Not on file  Relationships  . Social connections:    Talks on phone: Not on file    Gets together: Not on file    Attends religious service:  Not on file    Active member of club or organization: Not on file    Attends meetings of clubs or organizations: Not on file    Relationship status: Not on file  . Intimate partner violence:    Fear of current or ex partner: Not on file    Emotionally abused: Not on file    Physically abused: Not on file    Forced sexual activity: Not on file  Other Topics Concern  . Not on file  Social History Narrative  . Not on file   Family History  Family history unknown: Yes      VITAL SIGNS BP (!) 161/73   Pulse 63   Temp (!) 96.8 F (36 C)   Resp 18   Ht 4\' 11"  (1.499 m)   Wt 158 lb 1.6 oz (71.7 kg)   SpO2 98%   BMI 31.93 kg/m   Outpatient Encounter  Medications as of 04/28/2017  Medication Sig  . acetaminophen (TYLENOL) 325 MG tablet Take 650 mg by mouth every 6 (six) hours as needed for moderate pain.  Marland Kitchen acetaminophen (TYLENOL) 500 MG tablet Take 1,000 mg by mouth daily. Pain management  . aspirin 325 MG EC tablet Take 325 mg by mouth daily.  Marland Kitchen atorvastatin (LIPITOR) 10 MG tablet Take 10 mg by mouth daily at 6 PM.  . cholecalciferol (VITAMIN D) 1000 UNITS tablet Take 2,000 Units by mouth daily.  . Insulin Detemir (LEVEMIR FLEXPEN) 100 UNIT/ML Pen Inject 12 Units into the skin every morning. And 12 units subcutaneously in the evening  . linaclotide (LINZESS) 72 MCG capsule Take 72 mcg by mouth daily.  Marland Kitchen lisinopril (PRINIVIL,ZESTRIL) 5 MG tablet Take 5 mg by mouth daily.   Marland Kitchen loratadine (CLARITIN) 10 MG tablet Take 10 mg by mouth daily as needed for allergies.   . metoprolol tartrate (LOPRESSOR) 25 MG tablet Take 25 mg by mouth 2 (two) times daily. For HTN  Hold if B/P < 100/60 or HR < 60  . Multiple Vitamin (MULTIVITAMIN) tablet Take 1 tablet by mouth daily. Wound healing  . nitrofurantoin, macrocrystal-monohydrate, (MACROBID) 100 MG capsule Take 1 capsule (100 mg total) by mouth at bedtime.  . Omega-3 Fatty Acids (FISH OIL OMEGA-3 PO) Give 1 capsule by mouth every morning  . ondansetron (ZOFRAN) 4 MG tablet Take 4 mg by mouth every 6 (six) hours as needed for nausea or vomiting.   Marland Kitchen oxyCODONE-acetaminophen (PERCOCET/ROXICET) 5-325 MG tablet Take 1 tablet by mouth every 6 (six) hours.  . phenazopyridine (PYRIDIUM) 200 MG tablet Take 200 mg by mouth every 8 (eight) hours as needed for pain.  . Polyvinyl Alcohol-Povidone (FRESHKOTE) 2.7-2 % SOLN Apply 1 drop to eye 3 (three) times daily. To both eyes  . protein supplement (PROMOD) POWD Give 69ml by mouth daily x 30 days   No facility-administered encounter medications on file as of 04/28/2017.      SIGNIFICANT DIAGNOSTIC EXAMS  PREVIOUS   05-18-15: ct of abdomen and pelvis: 1. Motion and  hardware degraded images. 2. Moderate left-sided hydroureteronephrosis secondary to a distal left ureteric 11 x 13 mm calculus. Decreased left-sided renal function. 3. Large burden bilateral renal collecting system stones, including stones within both extrarenal pelves. 4. Cirrhosis with small volume ascites. 5. Proctitis, likely related to C. difficile colitis, given the clinical history. 6. Bladder wall irregularity which could relate to cystitis. Bladder diverticulum, suggesting a component of outlet obstruction. 7. Skin irregularity superficial to the coccyx. Recommend physical exam correlation  to exclude decubitus ulcer. No gross osseous destruction.   NO NEW EXAMS     LABS REVIEWED: PREVIOUS  04-01-16: urine culture: providencia stuartii 04-02-16: glucose 110; bun 30.5; creat 0.99; k+ 4.0; na++ 135; ca 9.3; liver normal albumin 3.5  05-14-16: tsh 2.18; hgb a1c 5.9; chol 136; ldl 48; trig 248; hdl 39  08-11-16: urine micro-albumin 1.5  09-14-16: chol 142; ldl 60; trig 228; hdl 36 hgb a1c 6.0  09-28-16: wbc 7.5; hgb 11.5; hct 35.1; mcv 102.4; plt 355 glucose 208; bun 36.1; creat 0.92; k+ 5.8; na++ 139; ca 9.5; liver normal albumin 3.5 11-16-16: glucose 97; bun 18.2; creat 0.77; k+ 5.0; na++ 136; ca 9.3 02-14-17: glucose 143; bun 25.9; creat 0.91; k+ 5.1; na++ 140; ca 9.6; liver normal albumin 4.0; tsh 1.01; hgb a1c 6.7; chol 127; ldl 64; trig 143; hdl 34   NO NEW LABS       Review of Systems  Constitutional: Negative for malaise/fatigue.  Respiratory: Negative for cough and shortness of breath.   Cardiovascular: Negative for chest pain, palpitations and leg swelling.  Gastrointestinal: Negative for abdominal pain, constipation and heartburn.  Musculoskeletal: Negative for back pain, joint pain and myalgias.  Skin: Negative.   Neurological: Negative for dizziness.  Psychiatric/Behavioral: The patient is not nervous/anxious.     Physical Exam  Constitutional: She appears well-developed  and well-nourished.  Neck: No thyromegaly present.  Cardiovascular: Normal rate, regular rhythm and intact distal pulses.  Murmur heard. 1/6  Pulmonary/Chest: Effort normal and breath sounds normal. No respiratory distress.  Abdominal: Soft. Bowel sounds are normal. She exhibits no distension. There is no tenderness.  Musculoskeletal: She exhibits no edema.  Is able to move all extremities   Lymphadenopathy:    She has no cervical adenopathy.  Neurological: She is alert.  Skin: Skin is warm and dry. She is not diaphoretic.  Left buttock stage II: 1.3 x 1.3 cm superficial nearly resolved. Will use barrier with zinc.   Psychiatric: She has a normal mood and affect.     .   ASSESSMENT/ PLAN:  TODAY:   1.  Left buttock stage II ulceration: is nearly resolved will use skin barrier with zinc and will monitor     MD is aware of resident's narcotic use and is in agreement with current plan of care. We will attempt to wean resident as apropriate    Ok Edwards NP [facility]  Contact [phone] Monday through Friday 8am- 5pm  After hours call [phone]

## 2017-04-29 ENCOUNTER — Other Ambulatory Visit: Payer: Self-pay

## 2017-04-29 MED ORDER — OXYCODONE-ACETAMINOPHEN 5-325 MG PO TABS: 1.0000 | ORAL_TABLET | Freq: Four times a day (QID) | ORAL | 0 refills | Status: DC

## 2017-04-29 NOTE — Telephone Encounter (Signed)
RX faxed to [facility] @ [phone], phone number [phone] 

## 2017-04-29 NOTE — Telephone Encounter (Signed)
RX faxed to [facility] @ [phone], phone number [phone]  Emergency supply

## 2017-05-03 DIAGNOSIS — L89309 Pressure ulcer of unspecified buttock, unspecified stage: Secondary | ICD-10-CM | POA: Diagnosis not present

## 2017-05-03 DIAGNOSIS — R3981 Functional urinary incontinence: Secondary | ICD-10-CM | POA: Diagnosis not present

## 2017-05-03 DIAGNOSIS — I10 Essential (primary) hypertension: Secondary | ICD-10-CM | POA: Diagnosis not present

## 2017-05-03 DIAGNOSIS — E1165 Type 2 diabetes mellitus with hyperglycemia: Secondary | ICD-10-CM | POA: Diagnosis not present

## 2017-05-04 ENCOUNTER — Encounter: Payer: Self-pay | Admitting: Adult Health

## 2017-05-04 ENCOUNTER — Non-Acute Institutional Stay (SKILLED_NURSING_FACILITY): Payer: Medicare Other | Admitting: Adult Health

## 2017-05-04 DIAGNOSIS — F0280 Dementia in other diseases classified elsewhere without behavioral disturbance: Secondary | ICD-10-CM | POA: Diagnosis not present

## 2017-05-04 DIAGNOSIS — G301 Alzheimer's disease with late onset: Secondary | ICD-10-CM | POA: Diagnosis not present

## 2017-05-04 DIAGNOSIS — Z794 Long term (current) use of insulin: Secondary | ICD-10-CM

## 2017-05-04 DIAGNOSIS — E119 Type 2 diabetes mellitus without complications: Secondary | ICD-10-CM

## 2017-05-04 NOTE — Progress Notes (Signed)
Location:   [facility] Room Number: 115 B Place of Service:  SNF (31)   CODE STATUS: Full Code (Most form updated 07/29/15)  No Known Allergies  Chief Complaint  Patient presents with  . Acute Visit    Care Plan Meeting    HPI:  We have come together for her care plan meeting. She does not have family present; but is able to participate. We have discussed her overall status; she did discuss her food preferences. She denies any pain; no change in appetite; no feelings of excessive thirst. There are no nursing concerns at this time. We did discuss her advanced directives and filled out her MOST form. She does not want CPR; wants to avoid ICU and does not want a feeding tube.    Past Medical History:  Diagnosis Date  . Acute pyelonephritis   . Alzheimer's disease   . Bladder outlet obstruction   . Depression   . Diarrhea   . Environmental and seasonal allergies   . Essential hypertension, benign   . Foley catheter in place   . GERD (gastroesophageal reflux disease)   . History of urinary tract infection   . Hypertension   . Hypothyroidism   . Insomnia   . Nephrolithiasis   . Osteoarthritis   . Reflux esophagitis   . Rickets, active   . Sepsis ([place])   . Unspecified constipation   . Unspecified psychosis   . Unspecified vitamin D deficiency     Past Surgical History:  Procedure Laterality Date  . ABDOMINAL HYSTERECTOMY    . CYSTOSCOPY W/ URETERAL STENT PLACEMENT Bilateral 05/18/2015   Procedure: CYSTOSCOPY WITH RETROGRADE PYELOGRAM/URETERAL STENT PLACEMENT;  Surgeon: Carolan Clines, MD;  Location: [facility];  Service: Urology;  Laterality: Bilateral;  . CYSTOSCOPY W/ URETERAL STENT REMOVAL Bilateral 09/12/2015   Procedure: CYSTOSCOPY WITH STENT REMOVAL;  Surgeon: Carolan Clines, MD;  Location: [facility];  Service: Urology;  Laterality: Bilateral;  1 HOUR  . CYSTOSCOPY WITH RETROGRADE PYELOGRAM, URETEROSCOPY AND STENT PLACEMENT Bilateral 07/18/2015   Procedure: CYSTOSCOPY REMOVE LEFT DOUBLE J STENT LEFT RETROGRADE PYELOGRAM, LEFT URETEROSCOPY, PYELOSCOPY, REPLACEMENT OF LEFT DOUBLE J STENT;  Surgeon: Carolan Clines, MD;  Location: [facility];  Service: Urology;  Laterality: Bilateral;  . HOLMIUM LASER APPLICATION Bilateral 6/38/7564   Procedure: HOLMIUM LASER OF LEFT LOWER URETERAL STONE, LEFT PYELOSCOPY, REPLACE LEFT DOUBLE J STENT REMOVE RIGHT DOUBLE J STENT RIGHT RETROGRADE PYELOGRAM, REPLACEMENT RIGHT DOUBLE J STENT ;  Surgeon: Carolan Clines, MD;  Location: [facility];  Service: Urology;  Laterality: Bilateral;  . KNEE ARTHROSCOPY Left 1997    Social History   Socioeconomic History  . Marital status: Married    Spouse name: Not on file  . Number of children: Not on file  . Years of education: Not on file  . Highest education level: Not on file  Occupational History  . Not on file  Social Needs  . Financial resource strain: Not on file  . Food insecurity:    Worry: Not on file    Inability: Not on file  . Transportation needs:    Medical: Not on file    Non-medical: Not on file  Tobacco Use  . Smoking status: Never Smoker  . Smokeless tobacco: Never Used  Substance and Sexual Activity  . Alcohol use: No  . Drug use: No  . Sexual activity: Not on file  Lifestyle  . Physical activity:    Days per week: Not on file  Minutes per session: Not on file  . Stress: Not on file  Relationships  . Social connections:    Talks on phone: Not on file    Gets together: Not on file    Attends religious service: Not on file    Active member of club or organization: Not on file    Attends meetings of clubs or organizations: Not on file    Relationship status: Not on file  . Intimate partner violence:    Fear of current or ex partner: Not on file    Emotionally abused: Not on file    Physically abused: Not on file    Forced sexual activity: Not on file  Other Topics Concern  . Not on file  Social History Narrative  . Not on  file   Family History  Family history unknown: Yes      VITAL SIGNS BP 136/70   Pulse 70   Temp (!) 96.8 F (36 C)   Resp 18   Ht 4\' 11"  (1.499 m)   Wt 158 lb 1.6 oz (71.7 kg)   SpO2 98%   BMI 31.93 kg/m   Outpatient Encounter Medications as of 05/04/2017  Medication Sig  . acetaminophen (TYLENOL) 325 MG tablet Take 650 mg by mouth every 6 (six) hours as needed for moderate pain.  Marland Kitchen acetaminophen (TYLENOL) 500 MG tablet Take 1,000 mg by mouth daily. Pain management  . aspirin 325 MG EC tablet Take 325 mg by mouth daily.  Marland Kitchen atorvastatin (LIPITOR) 10 MG tablet Take 10 mg by mouth daily at 6 PM.  . cholecalciferol (VITAMIN D) 1000 UNITS tablet Take 2,000 Units by mouth daily.  . Insulin Detemir (LEVEMIR FLEXPEN) 100 UNIT/ML Pen Inject 12 Units into the skin every morning. And 12 units subcutaneously in the evening  . linaclotide (LINZESS) 72 MCG capsule Take 72 mcg by mouth daily.  Marland Kitchen lisinopril (PRINIVIL,ZESTRIL) 5 MG tablet Take 5 mg by mouth daily.   Marland Kitchen loratadine (CLARITIN) 10 MG tablet Take 10 mg by mouth daily as needed for allergies.   . metoprolol tartrate (LOPRESSOR) 25 MG tablet Take 25 mg by mouth 2 (two) times daily. For HTN  Hold if B/P < 100/60 or HR < 60  . Multiple Vitamin (MULTIVITAMIN) tablet Take 1 tablet by mouth daily. Wound healing  . nitrofurantoin, macrocrystal-monohydrate, (MACROBID) 100 MG capsule Take 1 capsule (100 mg total) by mouth at bedtime.  . Omega-3 Fatty Acids (FISH OIL OMEGA-3 PO) Give 1 capsule by mouth every morning  . ondansetron (ZOFRAN) 4 MG tablet Take 4 mg by mouth every 6 (six) hours as needed for nausea or vomiting.   Marland Kitchen oxyCODONE-acetaminophen (PERCOCET/ROXICET) 5-325 MG tablet Take 1 tablet by mouth every 6 (six) hours.  . phenazopyridine (PYRIDIUM) 200 MG tablet Take 200 mg by mouth every 8 (eight) hours as needed for pain.  . Polyvinyl Alcohol-Povidone (FRESHKOTE) 2.7-2 % SOLN Apply 1 drop to eye 3 (three) times daily. To both eyes  .  protein supplement (PROMOD) POWD Give 62ml by mouth daily x 30 days   No facility-administered encounter medications on file as of 05/04/2017.      SIGNIFICANT DIAGNOSTIC EXAMS  PREVIOUS   05-18-15: ct of abdomen and pelvis: 1. Motion and hardware degraded images. 2. Moderate left-sided hydroureteronephrosis secondary to a distal left ureteric 11 x 13 mm calculus. Decreased left-sided renal function. 3. Large burden bilateral renal collecting system stones, including stones within both extrarenal pelves. 4. Cirrhosis with small volume ascites.  5. Proctitis, likely related to C. difficile colitis, given the clinical history. 6. Bladder wall irregularity which could relate to cystitis. Bladder diverticulum, suggesting a component of outlet obstruction. 7. Skin irregularity superficial to the coccyx. Recommend physical exam correlation to exclude decubitus ulcer. No gross osseous destruction.   NO NEW EXAMS     LABS REVIEWED: PREVIOUS  04-02-16: glucose 110; bun 30.5; creat 0.99; k+ 4.0; na++ 135; ca 9.3; liver normal albumin 3.5  05-14-16: tsh 2.18; hgb a1c 5.9; chol 136; ldl 48; trig 248; hdl 39  08-11-16: urine micro-albumin 1.5  09-14-16: chol 142; ldl 60; trig 228; hdl 36 hgb a1c 6.0  09-28-16: wbc 7.5; hgb 11.5; hct 35.1; mcv 102.4; plt 355 glucose 208; bun 36.1; creat 0.92; k+ 5.8; na++ 139; ca 9.5; liver normal albumin 3.5 11-16-16: glucose 97; bun 18.2; creat 0.77; k+ 5.0; na++ 136; ca 9.3 02-14-17: glucose 143; bun 25.9; creat 0.91; k+ 5.1; na++ 140; ca 9.6; liver normal albumin 4.0; tsh 1.01; hgb a1c 6.7; chol 127; ldl 64; trig 143; hdl 34   NO NEW LABS       Review of Systems  Constitutional: Negative for malaise/fatigue.  Respiratory: Negative for cough and shortness of breath.   Cardiovascular: Negative for chest pain, palpitations and leg swelling.  Gastrointestinal: Negative for abdominal pain, constipation and heartburn.  Musculoskeletal: Negative for back pain, joint pain  and myalgias.  Skin: Negative.   Neurological: Negative for dizziness.  Psychiatric/Behavioral: The patient is not nervous/anxious.       Physical Exam  Constitutional: She is oriented to person, place, and time. She appears well-developed and well-nourished. No distress.  Neck: No thyromegaly present.  Cardiovascular: Normal rate, regular rhythm and intact distal pulses.  Murmur heard. 1/6  Pulmonary/Chest: Effort normal and breath sounds normal. No respiratory distress.  Abdominal: Soft. Bowel sounds are normal. She exhibits no distension. There is no tenderness.  Musculoskeletal: She exhibits no edema.  Able to move all extremities   Lymphadenopathy:    She has no cervical adenopathy.  Neurological: She is alert and oriented to person, place, and time.  Skin: Skin is warm and dry. She is not diaphoretic.  Psychiatric: She has a normal mood and affect.  .   ASSESSMENT/ PLAN:  TODAY:   1.  Diabetes mellitus type II insulin dependent 2. Alzheimer's disease, late onset without behavioral disturbance  Will continue her current plan of care Will continue her current medications Will remove her dietary restrictions   Time spent with patient 40 minutes: 20 minutes spent with advanced directives: discussed her overall health; medications; her MOST form has been filled out: DNR: NO ICU: NO TUBE FEEDING. Verbalized understanding.    MD is aware of resident's narcotic use and is in agreement with current plan of care. We will attempt to wean resident as apropriate    Ok Edwards NP [facility]  Contact [phone] Monday through Friday 8am- 5pm  After hours call [phone]

## 2017-05-09 ENCOUNTER — Non-Acute Institutional Stay (SKILLED_NURSING_FACILITY): Payer: Medicare Other | Admitting: Internal Medicine

## 2017-05-09 ENCOUNTER — Encounter: Payer: Self-pay | Admitting: Internal Medicine

## 2017-05-09 DIAGNOSIS — E1169 Type 2 diabetes mellitus with other specified complication: Secondary | ICD-10-CM

## 2017-05-09 DIAGNOSIS — Z794 Long term (current) use of insulin: Secondary | ICD-10-CM | POA: Diagnosis not present

## 2017-05-09 DIAGNOSIS — E785 Hyperlipidemia, unspecified: Secondary | ICD-10-CM | POA: Diagnosis not present

## 2017-05-09 DIAGNOSIS — E119 Type 2 diabetes mellitus without complications: Secondary | ICD-10-CM

## 2017-05-09 DIAGNOSIS — E1159 Type 2 diabetes mellitus with other circulatory complications: Secondary | ICD-10-CM | POA: Diagnosis not present

## 2017-05-09 DIAGNOSIS — G301 Alzheimer's disease with late onset: Secondary | ICD-10-CM | POA: Diagnosis not present

## 2017-05-09 DIAGNOSIS — M150 Primary generalized (osteo)arthritis: Secondary | ICD-10-CM

## 2017-05-09 DIAGNOSIS — M159 Polyosteoarthritis, unspecified: Secondary | ICD-10-CM

## 2017-05-09 DIAGNOSIS — L89322 Pressure ulcer of left buttock, stage 2: Secondary | ICD-10-CM

## 2017-05-09 DIAGNOSIS — I10 Essential (primary) hypertension: Secondary | ICD-10-CM | POA: Diagnosis not present

## 2017-05-09 DIAGNOSIS — F0280 Dementia in other diseases classified elsewhere without behavioral disturbance: Secondary | ICD-10-CM | POA: Diagnosis not present

## 2017-05-09 DIAGNOSIS — H04123 Dry eye syndrome of bilateral lacrimal glands: Secondary | ICD-10-CM | POA: Diagnosis not present

## 2017-05-09 DIAGNOSIS — H26493 Other secondary cataract, bilateral: Secondary | ICD-10-CM | POA: Diagnosis not present

## 2017-05-09 NOTE — Progress Notes (Signed)
Patient ID: Ana Newman, female   DOB: Aug 20, 1935, 82 y.o.   MRN: [id number]  Location:  [place] Room Number: 115 B Place of Service:  SNF [phone]) Provider:  D[place], [place], DO  Patient Care Team: Gildardo Cranker, DO as PCP - General (Internal Medicine) Nyoka Cowden Phylis Bougie, NP as Nurse Practitioner ([place]) [facility], [place] ([place])  Extended Emergency Contact Information Primary Emergency Contact: Smith,Christie Address: [address] [address] [place] of [place] Phone: [phone] Relation: Spouse Secondary Emergency Contact: Allen Norris [place] of [place] Phone: [phone] Mobile Phone: [phone] Relation: Son  Code Status:  DNR Goals of care: Advanced Directive information Advanced Directives 05/09/2017  Does Patient Have a Medical Advance Directive? Yes  Type of Advance Directive Out of facility DNR (pink MOST or yellow form)  Does patient want to make changes to medical advance directive? No - Patient declined  Copy of [place] in Chart? -  Would patient like information on creating a medical advance directive? -  Pre-existing out of facility DNR order (yellow form or pink MOST form) Pink MOST form placed in chart (order not valid for inpatient use)     Chief Complaint  Patient presents with  . Medical Management of Chronic Issues    1 month follow up    HPI:  Pt is a 82 y.o. female seen today for medical management of chronic diseases.  She has no concerns. No nursing issues. No falls. Appetite ok and sleeps well. Pain well controlled. She is a poor historian due to dementia. Hx obtained from chart. CBGs 80-150s.   Dyslipidemia associated with type 2 diabetes mellitus- stable on lipitor 10 mg daily. LDL 64   Alzheimer's disease, late onset  - unchanged. She does not take any medications. Weight stable. Albumin  4.  Chronic constipation - stable on linzess 72 mcg daily   Osteoarthritis, primary multiple joints - pain controlled on tylenol 1 gm daily and percocet 5/325 mg every 6 hours routinely   Major depression with psychotic features - mood stable off medications  Nephrolithiasis; outlet obstruction - stable on long term macrobid daily; followed by urology.   Weight loss - stable; she gets nutritional supplements per facility protocol; albumin 4  HTN - stable on lisinopril 5 mg daily; lopressor 25 mg twice daily; ASA 325 mg daily    DM - controlled. A1c 6.7%; takes lantus  12 units every 12 hours; statin; ACEI; ASA  Past Medical History:  Diagnosis Date  . Acute pyelonephritis   . Alzheimer's disease   . Bladder outlet obstruction   . Depression   . Diarrhea   . Environmental and seasonal allergies   . Essential hypertension, benign   . Foley catheter in place   . GERD (gastroesophageal reflux disease)   . History of urinary tract infection   . Hypertension   . Hypothyroidism   . Insomnia   . Nephrolithiasis   . Osteoarthritis   . Reflux esophagitis   . Rickets, active   . Sepsis ([place])   . Unspecified constipation   . Unspecified psychosis   . Unspecified vitamin D deficiency    Past Surgical History:  Procedure Laterality Date  . ABDOMINAL HYSTERECTOMY    . CYSTOSCOPY W/ URETERAL STENT PLACEMENT Bilateral 05/18/2015   Procedure: CYSTOSCOPY WITH RETROGRADE PYELOGRAM/URETERAL STENT PLACEMENT;  Surgeon: Carolan Clines, MD;  Location:  [facility];  Service: Urology;  Laterality: Bilateral;  . CYSTOSCOPY W/ URETERAL STENT REMOVAL Bilateral 09/12/2015   Procedure: CYSTOSCOPY WITH STENT REMOVAL;  Surgeon: Carolan Clines, MD;  Location: [facility];  Service: Urology;  Laterality: Bilateral;  1 HOUR  . CYSTOSCOPY WITH RETROGRADE PYELOGRAM, URETEROSCOPY AND STENT PLACEMENT Bilateral 07/18/2015   Procedure: CYSTOSCOPY REMOVE LEFT DOUBLE J STENT LEFT RETROGRADE PYELOGRAM, LEFT  URETEROSCOPY, PYELOSCOPY, REPLACEMENT OF LEFT DOUBLE J STENT;  Surgeon: Carolan Clines, MD;  Location: [facility];  Service: Urology;  Laterality: Bilateral;  . HOLMIUM LASER APPLICATION Bilateral 6/96/2952   Procedure: HOLMIUM LASER OF LEFT LOWER URETERAL STONE, LEFT PYELOSCOPY, REPLACE LEFT DOUBLE J STENT REMOVE RIGHT DOUBLE J STENT RIGHT RETROGRADE PYELOGRAM, REPLACEMENT RIGHT DOUBLE J STENT ;  Surgeon: Carolan Clines, MD;  Location: [facility];  Service: Urology;  Laterality: Bilateral;  . KNEE ARTHROSCOPY Left 1997    No Known Allergies  Outpatient Encounter Medications as of 05/09/2017  Medication Sig  . acetaminophen (TYLENOL) 325 MG tablet Take 650 mg by mouth every 6 (six) hours as needed for moderate pain.  Marland Kitchen acetaminophen (TYLENOL) 500 MG tablet Take 1,000 mg by mouth daily. Pain management  . atorvastatin (LIPITOR) 10 MG tablet Take 10 mg by mouth daily at 6 PM.  . cholecalciferol (VITAMIN D) 1000 UNITS tablet Take 2,000 Units by mouth daily.  . Insulin Detemir (LEVEMIR FLEXPEN) 100 UNIT/ML Pen Inject 12 Units into the skin every morning. And 12 units subcutaneously in the evening  . linaclotide (LINZESS) 72 MCG capsule Take 72 mcg by mouth daily.  Marland Kitchen lisinopril (PRINIVIL,ZESTRIL) 5 MG tablet Take 5 mg by mouth daily.   Marland Kitchen loratadine (CLARITIN) 10 MG tablet Take 10 mg by mouth daily as needed for allergies.   . metoprolol tartrate (LOPRESSOR) 25 MG tablet Take 25 mg by mouth 2 (two) times daily. For HTN  Hold if B/P < 100/60 or HR < 60  . Multiple Vitamin (MULTIVITAMIN) tablet Take 1 tablet by mouth daily. Wound healing  . nitrofurantoin, macrocrystal-monohydrate, (MACROBID) 100 MG capsule Take 1 capsule (100 mg total) by mouth at bedtime.  . Omega-3 Fatty Acids (FISH OIL OMEGA-3 PO) Give 1 capsule by mouth every morning  . ondansetron (ZOFRAN) 4 MG tablet Take 4 mg by mouth every 6 (six) hours as needed for nausea or vomiting.   Marland Kitchen oxyCODONE-acetaminophen (PERCOCET/ROXICET) 5-325 MG  tablet Take 1 tablet by mouth every 6 (six) hours.  . phenazopyridine (PYRIDIUM) 200 MG tablet Take 200 mg by mouth every 8 (eight) hours as needed for pain.  . Polyvinyl Alcohol-Povidone (FRESHKOTE) 2.7-2 % SOLN Apply 1 drop to eye 3 (three) times daily. To both eyes  . protein supplement (PROMOD) POWD Give 82ml by mouth daily x 30 days  . [DISCONTINUED] aspirin 325 MG EC tablet Take 325 mg by mouth daily.   No facility-administered encounter medications on file as of 05/09/2017.     Review of Systems  Unable to perform ROS: Dementia    Immunization History  Administered Date(s) Administered  . Hepatitis B 11/12/2015  . Influenza Whole 11/09/2012  . Influenza-Unspecified 11/12/2013, 11/11/2014, 11/12/2015, 12/02/2016  . PPD Test 10/10/2015, 10/17/2015  . Pneumococcal-Unspecified 01/03/2008   Pertinent  Health Maintenance Due  Topic Date Due  . FOOT EXAM  06/15/2017 (Originally 06/02/2016)  . HEMOGLOBIN A1C  08/14/2017  . INFLUENZA VACCINE  09/01/2017  . DEXA SCAN  Completed  . PNA vac Low Risk Adult  Completed  . OPHTHALMOLOGY EXAM  Discontinued  Fall Risk  07/29/2016  Falls in the past year? No   Functional Status Survey:    Vitals:   05/09/17 0902  BP: 128/78  Pulse: 80  Resp: 16  Temp: (!) 97.4 F (36.3 C)  SpO2: 98%  Weight: 157 lb 3.2 oz (71.3 kg)  Height: 4\' 11"  (1.499 m)   Body mass index is 31.75 kg/m. Physical Exam  Constitutional: She is oriented to person, place, and time. She appears well-developed and well-nourished.  Lying in bed in NAD  HENT:  Mouth/Throat: Oropharynx is clear and moist. No oropharyngeal exudate.  MMM; no oral thrush  Eyes: Pupils are equal, round, and reactive to light. No scleral icterus.  Neck: Neck supple. Carotid bruit is not present. No tracheal deviation present. No thyromegaly present.  Cardiovascular: Normal rate, regular rhythm and intact distal pulses. Exam reveals no gallop and no friction rub.  Murmur (1/6 SEM)  heard. No LE edema b/l. no calf TTP.   Pulmonary/Chest: Effort normal and breath sounds normal. No stridor. No respiratory distress. She has no wheezes. She has no rales.  Abdominal: Soft. Normal appearance and bowel sounds are normal. She exhibits no distension and no mass. There is no hepatomegaly. There is no tenderness. There is no rigidity, no rebound and no guarding. No hernia.  obese  Musculoskeletal: She exhibits edema.  Lymphadenopathy:    She has no cervical adenopathy.  Neurological: She is alert and oriented to person, place, and time. She has normal reflexes.  Skin: Skin is warm and dry. No rash noted.  Left buttock wound followed by facility wound care  Psychiatric: She has a normal mood and affect. Her behavior is normal. Judgment and thought content normal.    Labs reviewed: Recent Labs    09/28/16 11/16/16 02/14/17  NA 139 136* 140  K 5.8* 5.0 5.1  BUN 36* 18 24*  CREATININE 0.9 0.8 0.9   Recent Labs    08/11/16 09/28/16 02/14/17  AST 14 13 11*  ALT 9 9 11   ALKPHOS 69 71 76   Recent Labs    08/11/16 09/28/16  WBC 10.8 7.5  NEUTROABS 4 4  HGB 13.8 11.5*  HCT 41 35*  PLT 273 355   Lab Results  Component Value Date   TSH 1.01 02/14/2017   Lab Results  Component Value Date   HGBA1C 6.7 02/14/2017   Lab Results  Component Value Date   CHOL 127 02/14/2017   HDL 34 (A) 02/14/2017   LDLCALC 64 02/14/2017   TRIG 143 02/14/2017    Significant Diagnostic Results in last 30 days:  No results found.  Assessment/Plan   ICD-10-CM   1. Stage II pressure ulcer of left buttock L89.322   2. Late onset Alzheimer's disease without behavioral disturbance G30.1    F02.80   3. Diabetes mellitus, type II, insulin dependent ([facility]) E11.9    Z79.4   4. Primary osteoarthritis involving multiple joints M15.0   5. Hypertension associated with diabetes ([place]) E11.59    I10   6. Dyslipidemia associated with type 2 diabetes mellitus ([place]) E11.69    E78.5      Cont  current meds as ordered  Wound care as ordered. Cont nutritional supplement as ordered  PT/OT/ST as indicated  Will follow  Labs/tests ordered: none   Kerryann Allaire S. Perlie Gold  [facility] [address] [place], [place] [address] [phone] Cell (Monday-Friday 8 AM - 5 PM) [phone]  After 5 PM and follow prompts

## 2017-05-10 DIAGNOSIS — L89309 Pressure ulcer of unspecified buttock, unspecified stage: Secondary | ICD-10-CM | POA: Diagnosis not present

## 2017-05-10 DIAGNOSIS — I10 Essential (primary) hypertension: Secondary | ICD-10-CM | POA: Diagnosis not present

## 2017-05-10 DIAGNOSIS — E1165 Type 2 diabetes mellitus with hyperglycemia: Secondary | ICD-10-CM | POA: Diagnosis not present

## 2017-05-10 DIAGNOSIS — R3981 Functional urinary incontinence: Secondary | ICD-10-CM | POA: Diagnosis not present

## 2017-05-11 DIAGNOSIS — M2550 Pain in unspecified joint: Secondary | ICD-10-CM | POA: Diagnosis not present

## 2017-05-11 DIAGNOSIS — N319 Neuromuscular dysfunction of bladder, unspecified: Secondary | ICD-10-CM | POA: Diagnosis not present

## 2017-05-11 DIAGNOSIS — E1165 Type 2 diabetes mellitus with hyperglycemia: Secondary | ICD-10-CM | POA: Diagnosis not present

## 2017-05-11 DIAGNOSIS — M6281 Muscle weakness (generalized): Secondary | ICD-10-CM | POA: Diagnosis not present

## 2017-05-12 DIAGNOSIS — M6281 Muscle weakness (generalized): Secondary | ICD-10-CM | POA: Diagnosis not present

## 2017-05-12 DIAGNOSIS — E1165 Type 2 diabetes mellitus with hyperglycemia: Secondary | ICD-10-CM | POA: Diagnosis not present

## 2017-05-12 DIAGNOSIS — M2550 Pain in unspecified joint: Secondary | ICD-10-CM | POA: Diagnosis not present

## 2017-05-12 DIAGNOSIS — N319 Neuromuscular dysfunction of bladder, unspecified: Secondary | ICD-10-CM | POA: Diagnosis not present

## 2017-05-13 DIAGNOSIS — N319 Neuromuscular dysfunction of bladder, unspecified: Secondary | ICD-10-CM | POA: Diagnosis not present

## 2017-05-13 DIAGNOSIS — E1165 Type 2 diabetes mellitus with hyperglycemia: Secondary | ICD-10-CM | POA: Diagnosis not present

## 2017-05-13 DIAGNOSIS — M6281 Muscle weakness (generalized): Secondary | ICD-10-CM | POA: Diagnosis not present

## 2017-05-13 DIAGNOSIS — M2550 Pain in unspecified joint: Secondary | ICD-10-CM | POA: Diagnosis not present

## 2017-05-16 DIAGNOSIS — E1165 Type 2 diabetes mellitus with hyperglycemia: Secondary | ICD-10-CM | POA: Diagnosis not present

## 2017-05-16 DIAGNOSIS — M2550 Pain in unspecified joint: Secondary | ICD-10-CM | POA: Diagnosis not present

## 2017-05-16 DIAGNOSIS — M6281 Muscle weakness (generalized): Secondary | ICD-10-CM | POA: Diagnosis not present

## 2017-05-16 DIAGNOSIS — N319 Neuromuscular dysfunction of bladder, unspecified: Secondary | ICD-10-CM | POA: Diagnosis not present

## 2017-05-17 DIAGNOSIS — N319 Neuromuscular dysfunction of bladder, unspecified: Secondary | ICD-10-CM | POA: Diagnosis not present

## 2017-05-17 DIAGNOSIS — I10 Essential (primary) hypertension: Secondary | ICD-10-CM | POA: Diagnosis not present

## 2017-05-17 DIAGNOSIS — L89309 Pressure ulcer of unspecified buttock, unspecified stage: Secondary | ICD-10-CM | POA: Diagnosis not present

## 2017-05-17 DIAGNOSIS — M2550 Pain in unspecified joint: Secondary | ICD-10-CM | POA: Diagnosis not present

## 2017-05-17 DIAGNOSIS — M6281 Muscle weakness (generalized): Secondary | ICD-10-CM | POA: Diagnosis not present

## 2017-05-17 DIAGNOSIS — R3981 Functional urinary incontinence: Secondary | ICD-10-CM | POA: Diagnosis not present

## 2017-05-17 DIAGNOSIS — E1165 Type 2 diabetes mellitus with hyperglycemia: Secondary | ICD-10-CM | POA: Diagnosis not present

## 2017-05-18 DIAGNOSIS — E1165 Type 2 diabetes mellitus with hyperglycemia: Secondary | ICD-10-CM | POA: Diagnosis not present

## 2017-05-18 DIAGNOSIS — N319 Neuromuscular dysfunction of bladder, unspecified: Secondary | ICD-10-CM | POA: Diagnosis not present

## 2017-05-18 DIAGNOSIS — M2550 Pain in unspecified joint: Secondary | ICD-10-CM | POA: Diagnosis not present

## 2017-05-18 DIAGNOSIS — M6281 Muscle weakness (generalized): Secondary | ICD-10-CM | POA: Diagnosis not present

## 2017-05-24 DIAGNOSIS — E1165 Type 2 diabetes mellitus with hyperglycemia: Secondary | ICD-10-CM | POA: Diagnosis not present

## 2017-05-24 DIAGNOSIS — L89309 Pressure ulcer of unspecified buttock, unspecified stage: Secondary | ICD-10-CM | POA: Diagnosis not present

## 2017-05-24 DIAGNOSIS — I10 Essential (primary) hypertension: Secondary | ICD-10-CM | POA: Diagnosis not present

## 2017-05-24 DIAGNOSIS — R3981 Functional urinary incontinence: Secondary | ICD-10-CM | POA: Diagnosis not present

## 2017-05-30 ENCOUNTER — Other Ambulatory Visit: Payer: Self-pay

## 2017-05-30 MED ORDER — OXYCODONE-ACETAMINOPHEN 5-325 MG PO TABS: 1.0000 | ORAL_TABLET | Freq: Four times a day (QID) | ORAL | 0 refills | Status: DC

## 2017-05-30 NOTE — Telephone Encounter (Signed)
RX faxed to [facility] @ [phone], phone number [phone] 

## 2017-05-31 DIAGNOSIS — L89309 Pressure ulcer of unspecified buttock, unspecified stage: Secondary | ICD-10-CM | POA: Diagnosis not present

## 2017-05-31 DIAGNOSIS — E1165 Type 2 diabetes mellitus with hyperglycemia: Secondary | ICD-10-CM | POA: Diagnosis not present

## 2017-05-31 DIAGNOSIS — I10 Essential (primary) hypertension: Secondary | ICD-10-CM | POA: Diagnosis not present

## 2017-05-31 DIAGNOSIS — R3981 Functional urinary incontinence: Secondary | ICD-10-CM | POA: Diagnosis not present

## 2017-06-01 ENCOUNTER — Other Ambulatory Visit: Payer: Self-pay

## 2017-06-01 MED ORDER — OXYCODONE-ACETAMINOPHEN 5-325 MG PO TABS: 1.0000 | ORAL_TABLET | Freq: Four times a day (QID) | ORAL | 0 refills | Status: DC

## 2017-06-01 NOTE — Telephone Encounter (Signed)
Rx faxed to [facility] (p) [phone], (f) [phone]  

## 2017-06-07 DIAGNOSIS — L89309 Pressure ulcer of unspecified buttock, unspecified stage: Secondary | ICD-10-CM | POA: Diagnosis not present

## 2017-06-07 DIAGNOSIS — I10 Essential (primary) hypertension: Secondary | ICD-10-CM | POA: Diagnosis not present

## 2017-06-07 DIAGNOSIS — E1165 Type 2 diabetes mellitus with hyperglycemia: Secondary | ICD-10-CM | POA: Diagnosis not present

## 2017-06-07 DIAGNOSIS — R3981 Functional urinary incontinence: Secondary | ICD-10-CM | POA: Diagnosis not present

## 2017-06-16 ENCOUNTER — Encounter: Payer: Self-pay | Admitting: Adult Health

## 2017-06-16 ENCOUNTER — Non-Acute Institutional Stay (SKILLED_NURSING_FACILITY): Payer: Medicare Other | Admitting: Adult Health

## 2017-06-16 DIAGNOSIS — I10 Essential (primary) hypertension: Secondary | ICD-10-CM | POA: Diagnosis not present

## 2017-06-16 DIAGNOSIS — E1159 Type 2 diabetes mellitus with other circulatory complications: Secondary | ICD-10-CM

## 2017-06-16 DIAGNOSIS — F323 Major depressive disorder, single episode, severe with psychotic features: Secondary | ICD-10-CM

## 2017-06-16 DIAGNOSIS — R634 Abnormal weight loss: Secondary | ICD-10-CM

## 2017-06-16 DIAGNOSIS — N320 Bladder-neck obstruction: Secondary | ICD-10-CM | POA: Diagnosis not present

## 2017-06-16 DIAGNOSIS — N200 Calculus of kidney: Secondary | ICD-10-CM | POA: Diagnosis not present

## 2017-06-16 DIAGNOSIS — I152 Hypertension secondary to endocrine disorders: Secondary | ICD-10-CM

## 2017-06-16 NOTE — Progress Notes (Signed)
Location:   [facility] Room Number: 115 B Place of Service:  SNF (31)   CODE STATUS: DNR  No Known Allergies  Chief Complaint  Patient presents with  . Medical Management of Chronic Issues    Hypertension; nephrolithiasis; depression; weight loss     HPI:  She is a 82 year old long term resident of this facility being seen for the management of her chronic illnesses: hypertension; nephrolithiasis; depression; weight loss. She tells me that she is feeling good; she denies back or joint pain; denies any anxiety; states her appetite is good. There are no nursing concerns at this time.   Past Medical History:  Diagnosis Date  . Acute pyelonephritis   . Alzheimer's disease   . Bladder outlet obstruction   . Depression   . Diarrhea   . Environmental and seasonal allergies   . Essential hypertension, benign   . Foley catheter in place   . GERD (gastroesophageal reflux disease)   . History of urinary tract infection   . Hypertension   . Hypothyroidism   . Insomnia   . Nephrolithiasis   . Osteoarthritis   . Reflux esophagitis   . Rickets, active   . Sepsis ([place])   . Unspecified constipation   . Unspecified psychosis   . Unspecified vitamin D deficiency     Past Surgical History:  Procedure Laterality Date  . ABDOMINAL HYSTERECTOMY    . CYSTOSCOPY W/ URETERAL STENT PLACEMENT Bilateral 05/18/2015   Procedure: CYSTOSCOPY WITH RETROGRADE PYELOGRAM/URETERAL STENT PLACEMENT;  Surgeon: Carolan Clines, MD;  Location: [facility];  Service: Urology;  Laterality: Bilateral;  . CYSTOSCOPY W/ URETERAL STENT REMOVAL Bilateral 09/12/2015   Procedure: CYSTOSCOPY WITH STENT REMOVAL;  Surgeon: Carolan Clines, MD;  Location: [facility];  Service: Urology;  Laterality: Bilateral;  1 HOUR  . CYSTOSCOPY WITH RETROGRADE PYELOGRAM, URETEROSCOPY AND STENT PLACEMENT Bilateral 07/18/2015   Procedure: CYSTOSCOPY REMOVE LEFT DOUBLE J STENT LEFT RETROGRADE PYELOGRAM, LEFT URETEROSCOPY,  PYELOSCOPY, REPLACEMENT OF LEFT DOUBLE J STENT;  Surgeon: Carolan Clines, MD;  Location: [facility];  Service: Urology;  Laterality: Bilateral;  . HOLMIUM LASER APPLICATION Bilateral 4/78/2956   Procedure: HOLMIUM LASER OF LEFT LOWER URETERAL STONE, LEFT PYELOSCOPY, REPLACE LEFT DOUBLE J STENT REMOVE RIGHT DOUBLE J STENT RIGHT RETROGRADE PYELOGRAM, REPLACEMENT RIGHT DOUBLE J STENT ;  Surgeon: Carolan Clines, MD;  Location: [facility];  Service: Urology;  Laterality: Bilateral;  . KNEE ARTHROSCOPY Left 1997    Social History   Socioeconomic History  . Marital status: Married    Spouse name: Not on file  . Number of children: Not on file  . Years of education: Not on file  . Highest education level: Not on file  Occupational History  . Not on file  Social Needs  . Financial resource strain: Not on file  . Food insecurity:    Worry: Not on file    Inability: Not on file  . Transportation needs:    Medical: Not on file    Non-medical: Not on file  Tobacco Use  . Smoking status: Never Smoker  . Smokeless tobacco: Never Used  Substance and Sexual Activity  . Alcohol use: No  . Drug use: No  . Sexual activity: Not on file  Lifestyle  . Physical activity:    Days per week: Not on file    Minutes per session: Not on file  . Stress: Not on file  Relationships  . Social connections:    Talks on phone:  Not on file    Gets together: Not on file    Attends religious service: Not on file    Active member of club or organization: Not on file    Attends meetings of clubs or organizations: Not on file    Relationship status: Not on file  . Intimate partner violence:    Fear of current or ex partner: Not on file    Emotionally abused: Not on file    Physically abused: Not on file    Forced sexual activity: Not on file  Other Topics Concern  . Not on file  Social History Narrative  . Not on file   Family History  Family history unknown: Yes      VITAL SIGNS BP 131/77   Pulse  75   Temp 97.8 F (36.6 C)   Resp (!) 21   Ht 4\' 11"  (1.499 m)   Wt 155 lb 14.4 oz (70.7 kg)   SpO2 98%   BMI 31.49 kg/m   Outpatient Encounter Medications as of 06/16/2017  Medication Sig  . acetaminophen (TYLENOL) 325 MG tablet Take 650 mg by mouth every 6 (six) hours as needed for moderate pain.  Marland Kitchen acetaminophen (TYLENOL) 500 MG tablet Take 1,000 mg by mouth daily. Pain management  . aspirin 325 MG tablet Take 325 mg by mouth daily.  Marland Kitchen atorvastatin (LIPITOR) 10 MG tablet Take 10 mg by mouth daily at 6 PM.  . cholecalciferol (VITAMIN D) 1000 UNITS tablet Take 2,000 Units by mouth daily.  . Insulin Detemir (LEVEMIR FLEXPEN) 100 UNIT/ML Pen Inject 12 Units into the skin every morning. And 12 units subcutaneously in the evening  . linaclotide (LINZESS) 72 MCG capsule Take 72 mcg by mouth daily.  Marland Kitchen lisinopril (PRINIVIL,ZESTRIL) 5 MG tablet Take 5 mg by mouth daily.   Marland Kitchen loratadine (CLARITIN) 10 MG tablet Take 10 mg by mouth daily as needed for allergies.   . metoprolol tartrate (LOPRESSOR) 25 MG tablet Take 25 mg by mouth 2 (two) times daily. For HTN  Hold if B/P < 100/60 or HR < 60  . Multiple Vitamin (MULTIVITAMIN) tablet Take 1 tablet by mouth daily. Wound healing  . nitrofurantoin, macrocrystal-monohydrate, (MACROBID) 100 MG capsule Take 1 capsule (100 mg total) by mouth at bedtime.  . Omega-3 Fatty Acids (FISH OIL OMEGA-3 PO) Give 1 capsule by mouth every morning  . ondansetron (ZOFRAN) 4 MG tablet Take 4 mg by mouth every 6 (six) hours as needed for nausea or vomiting.   Marland Kitchen oxyCODONE-acetaminophen (PERCOCET/ROXICET) 5-325 MG tablet Take 1 tablet by mouth every 6 (six) hours.  . phenazopyridine (PYRIDIUM) 200 MG tablet Take 200 mg by mouth every 8 (eight) hours as needed for pain.  . Polyvinyl Alcohol-Povidone (FRESHKOTE) 2.7-2 % SOLN Apply 1 drop to eye 3 (three) times daily. To both eyes   No facility-administered encounter medications on file as of 06/16/2017.      SIGNIFICANT  DIAGNOSTIC EXAMS  PREVIOUS   05-18-15: ct of abdomen and pelvis: 1. Motion and hardware degraded images. 2. Moderate left-sided hydroureteronephrosis secondary to a distal left ureteric 11 x 13 mm calculus. Decreased left-sided renal function. 3. Large burden bilateral renal collecting system stones, including stones within both extrarenal pelves. 4. Cirrhosis with small volume ascites. 5. Proctitis, likely related to C. difficile colitis, given the clinical history. 6. Bladder wall irregularity which could relate to cystitis. Bladder diverticulum, suggesting a component of outlet obstruction. 7. Skin irregularity superficial to the coccyx. Recommend physical exam  correlation to exclude decubitus ulcer. No gross osseous destruction.   NO NEW EXAMS     LABS REVIEWED: PREVIOUS   08-11-16: urine micro-albumin 1.5  09-14-16: chol 142; ldl 60; trig 228; hdl 36 hgb a1c 6.0  09-28-16: wbc 7.5; hgb 11.5; hct 35.1; mcv 102.4; plt 355 glucose 208; bun 36.1; creat 0.92; k+ 5.8; na++ 139; ca 9.5; liver normal albumin 3.5 11-16-16: glucose 97; bun 18.2; creat 0.77; k+ 5.0; na++ 136; ca 9.3 02-14-17: glucose 143; bun 25.9; creat 0.91; k+ 5.1; na++ 140; ca 9.6; liver normal albumin 4.0; tsh 1.01; hgb a1c 6.7; chol 127; ldl 64; trig 143; hdl 34   NO NEW LABS       Review of Systems  Constitutional: Negative for malaise/fatigue.  Respiratory: Negative for cough and shortness of breath.   Cardiovascular: Negative for chest pain, palpitations and leg swelling.  Gastrointestinal: Negative for abdominal pain, constipation and heartburn.  Musculoskeletal: Negative for back pain, joint pain and myalgias.  Skin: Negative.   Neurological: Negative for dizziness.  Psychiatric/Behavioral: The patient is not nervous/anxious.     Physical Exam  Constitutional: She appears well-developed and well-nourished. No distress.  Neck: No thyromegaly present.  Cardiovascular: Normal rate, regular rhythm and intact  distal pulses.  Murmur heard. 1/6  Pulmonary/Chest: Effort normal and breath sounds normal. No respiratory distress.  Abdominal: Soft. Bowel sounds are normal. She exhibits no distension. There is no tenderness.  Musculoskeletal: She exhibits no edema.  Is able to move all extremities   Lymphadenopathy:    She has no cervical adenopathy.  Neurological: She is alert.  Skin: Skin is warm and dry. She is not diaphoretic.  Left buttock stage III: healed  Bilateral feet with excessive dry skin and flaky   Psychiatric: She has a normal mood and affect.   .  ASSESSMENT/ PLAN:  TODAY:   1. Major depression with psychotic features: is presently stable is presently not taking medications; will monitor   2. Nephrolithiasis; outlet obstruction: is on long term macrobid daily  is followed by urology.   3. Weight loss, non intentional : her current weight is 155 pounds; her weight in April 2018: 162 pounds. Will continue supplements per facility protocol.   4.  Hypertension associated with diabetes :b/p 131/77 is stable; will continue lisinopril 5 mg daily; and lopressor 25 mg twice daily  Asa 325 mg daily     PREVIOUS PROBLEMS:   5. Diabetes mellitus type II insulin dependent:  hgb a1c is 6.7  She is presently stable her cbgs are good ; will continue  lantus  12 units every 12 hours  She is on a statin and ace and asa   6. Dyslipidemia associated with type 2 diabetes mellitus: ldl is 64 ; is stable will continue lipitor 10 mg daily and fish oil 1 gm daily    7.  Alzheimer's disease, late onset without behavioral disturbance: no change in status; is presently not taking medications will not make changes will monitor her status.  Her current weight is 155 pounds; which is presently stable will monitor   8.  Chronic constipation:stable will continue linzess 72 mcg daily   9. Osteoarthritis, primary multiple joints: is stable  pain is presently being managed; will continue tylenol 1 gm daily  and percocet 5/325 mg every 6 hours routinely    Will use triamcinolone 0.025% oint twice daily to bilateral feet Will check hgb a1c    MD is aware of resident's narcotic use and  is in agreement with current plan of care. We will attempt to wean resident as apropriate   Ok Edwards NP [facility]  Contact [phone] Monday through Friday 8am- 5pm  After hours call [phone]

## 2017-06-17 DIAGNOSIS — E785 Hyperlipidemia, unspecified: Secondary | ICD-10-CM | POA: Diagnosis not present

## 2017-06-17 DIAGNOSIS — I10 Essential (primary) hypertension: Secondary | ICD-10-CM | POA: Diagnosis not present

## 2017-06-17 DIAGNOSIS — E119 Type 2 diabetes mellitus without complications: Secondary | ICD-10-CM | POA: Diagnosis not present

## 2017-06-17 LAB — HEMOGLOBIN A1C: HEMOGLOBIN A1C: 6

## 2017-06-29 DIAGNOSIS — B351 Tinea unguium: Secondary | ICD-10-CM | POA: Diagnosis not present

## 2017-06-29 DIAGNOSIS — E1051 Type 1 diabetes mellitus with diabetic peripheral angiopathy without gangrene: Secondary | ICD-10-CM | POA: Diagnosis not present

## 2017-06-29 DIAGNOSIS — R262 Difficulty in walking, not elsewhere classified: Secondary | ICD-10-CM | POA: Diagnosis not present

## 2017-07-04 ENCOUNTER — Other Ambulatory Visit: Payer: Self-pay

## 2017-07-04 MED ORDER — OXYCODONE-ACETAMINOPHEN 5-325 MG PO TABS: 1.0000 | ORAL_TABLET | Freq: Four times a day (QID) | ORAL | 0 refills | Status: DC

## 2017-07-04 NOTE — Telephone Encounter (Signed)
Rx faxed to [facility] (P) [phone], (F) [phone] 

## 2017-07-14 ENCOUNTER — Non-Acute Institutional Stay (SKILLED_NURSING_FACILITY): Payer: Medicare Other | Admitting: Adult Health

## 2017-07-14 ENCOUNTER — Encounter: Payer: Self-pay | Admitting: Adult Health

## 2017-07-14 DIAGNOSIS — I10 Essential (primary) hypertension: Secondary | ICD-10-CM | POA: Diagnosis not present

## 2017-07-14 DIAGNOSIS — K5909 Other constipation: Secondary | ICD-10-CM

## 2017-07-14 DIAGNOSIS — E1169 Type 2 diabetes mellitus with other specified complication: Secondary | ICD-10-CM

## 2017-07-14 DIAGNOSIS — N319 Neuromuscular dysfunction of bladder, unspecified: Secondary | ICD-10-CM | POA: Diagnosis not present

## 2017-07-14 DIAGNOSIS — Z794 Long term (current) use of insulin: Secondary | ICD-10-CM

## 2017-07-14 DIAGNOSIS — E119 Type 2 diabetes mellitus without complications: Secondary | ICD-10-CM

## 2017-07-14 DIAGNOSIS — F0280 Dementia in other diseases classified elsewhere without behavioral disturbance: Secondary | ICD-10-CM

## 2017-07-14 DIAGNOSIS — E785 Hyperlipidemia, unspecified: Secondary | ICD-10-CM

## 2017-07-14 DIAGNOSIS — E1165 Type 2 diabetes mellitus with hyperglycemia: Secondary | ICD-10-CM | POA: Diagnosis not present

## 2017-07-14 DIAGNOSIS — G301 Alzheimer's disease with late onset: Secondary | ICD-10-CM

## 2017-07-14 NOTE — Progress Notes (Signed)
[facility] Room Number: 115 B Place of Service:  SNF (31)   CODE STATUS: DNR (Most form updated 05/04/17)  No Known Allergies  Chief Complaint  Patient presents with  . Medical Management of Chronic Issues    Diabetes; dyslipidemia; constipation; dementia     HPI:  She is a 82 year old long term resident of this facility being seen for the management of her chronic illnesses: diabetes; dyslipidemia; constipation; dementia. She denies any uncontrolled pain; no change in appetite and denies constipation. There are no nursing concerns at this time.   Past Medical History:  Diagnosis Date  . Acute pyelonephritis   . Alzheimer's disease   . Bladder outlet obstruction   . Depression   . Diarrhea   . Environmental and seasonal allergies   . Essential hypertension, benign   . Foley catheter in place   . GERD (gastroesophageal reflux disease)   . History of urinary tract infection   . Hypertension   . Hypothyroidism   . Insomnia   . Nephrolithiasis   . Osteoarthritis   . Reflux esophagitis   . Rickets, active   . Sepsis ([place])   . Unspecified constipation   . Unspecified psychosis   . Unspecified vitamin D deficiency     Past Surgical History:  Procedure Laterality Date  . ABDOMINAL HYSTERECTOMY    . CYSTOSCOPY W/ URETERAL STENT PLACEMENT Bilateral 05/18/2015   Procedure: CYSTOSCOPY WITH RETROGRADE PYELOGRAM/URETERAL STENT PLACEMENT;  Surgeon: Carolan Clines, MD;  Location: [facility];  Service: Urology;  Laterality: Bilateral;  . CYSTOSCOPY W/ URETERAL STENT REMOVAL Bilateral 09/12/2015   Procedure: CYSTOSCOPY WITH STENT REMOVAL;  Surgeon: Carolan Clines, MD;  Location: [facility];  Service: Urology;  Laterality: Bilateral;  1 HOUR  . CYSTOSCOPY WITH RETROGRADE PYELOGRAM, URETEROSCOPY AND STENT PLACEMENT Bilateral 07/18/2015   Procedure: CYSTOSCOPY REMOVE LEFT DOUBLE J STENT LEFT RETROGRADE PYELOGRAM, LEFT URETEROSCOPY, PYELOSCOPY, REPLACEMENT OF LEFT DOUBLE J  STENT;  Surgeon: Carolan Clines, MD;  Location: [facility];  Service: Urology;  Laterality: Bilateral;  . HOLMIUM LASER APPLICATION Bilateral 1/61/0960   Procedure: HOLMIUM LASER OF LEFT LOWER URETERAL STONE, LEFT PYELOSCOPY, REPLACE LEFT DOUBLE J STENT REMOVE RIGHT DOUBLE J STENT RIGHT RETROGRADE PYELOGRAM, REPLACEMENT RIGHT DOUBLE J STENT ;  Surgeon: Carolan Clines, MD;  Location: [facility];  Service: Urology;  Laterality: Bilateral;  . KNEE ARTHROSCOPY Left 1997    Social History   Socioeconomic History  . Marital status: Married    Spouse name: Not on file  . Number of children: Not on file  . Years of education: Not on file  . Highest education level: Not on file  Occupational History  . Not on file  Social Needs  . Financial resource strain: Not on file  . Food insecurity:    Worry: Not on file    Inability: Not on file  . Transportation needs:    Medical: Not on file    Non-medical: Not on file  Tobacco Use  . Smoking status: Never Smoker  . Smokeless tobacco: Never Used  Substance and Sexual Activity  . Alcohol use: No  . Drug use: No  . Sexual activity: Not on file  Lifestyle  . Physical activity:    Days per week: Not on file    Minutes per session: Not on file  . Stress: Not on file  Relationships  . Social connections:    Talks on phone: Not on file    Gets together: Not  on file    Attends religious service: Not on file    Active member of club or organization: Not on file    Attends meetings of clubs or organizations: Not on file    Relationship status: Not on file  . Intimate partner violence:    Fear of current or ex partner: Not on file    Emotionally abused: Not on file    Physically abused: Not on file    Forced sexual activity: Not on file  Other Topics Concern  . Not on file  Social History Narrative  . Not on file   Family History  Family history unknown: Yes      VITAL SIGNS BP 136/70   Pulse 75   Temp 98.1 F (36.7 C)   Resp 18    Ht 4\' 11"  (1.499 m)   Wt 155 lb 14.4 oz (70.7 kg)   SpO2 97%   BMI 31.49 kg/m   Outpatient Encounter Medications as of 07/14/2017  Medication Sig  . acetaminophen (TYLENOL) 325 MG tablet Take 650 mg by mouth every 6 (six) hours as needed for moderate pain.  Marland Kitchen acetaminophen (TYLENOL) 500 MG tablet Take 1,000 mg by mouth daily. Pain management  . aspirin 325 MG tablet Take 325 mg by mouth daily.  Marland Kitchen atorvastatin (LIPITOR) 10 MG tablet Take 10 mg by mouth daily at 6 PM.  . cholecalciferol (VITAMIN D) 1000 UNITS tablet Take 2,000 Units by mouth daily.  . Insulin Detemir (LEVEMIR FLEXPEN) 100 UNIT/ML Pen Inject 12 Units into the skin every morning. And 12 units subcutaneously in the evening  . linaclotide (LINZESS) 72 MCG capsule Take 72 mcg by mouth daily.  Marland Kitchen lisinopril (PRINIVIL,ZESTRIL) 5 MG tablet Take 5 mg by mouth daily.   Marland Kitchen loratadine (CLARITIN) 10 MG tablet Take 10 mg by mouth daily as needed for allergies.   . metoprolol tartrate (LOPRESSOR) 25 MG tablet Take 25 mg by mouth 2 (two) times daily. For HTN  Hold if B/P < 100/60 or HR < 60  . Multiple Vitamin (MULTIVITAMIN) tablet Take 1 tablet by mouth daily. Wound healing  . nitrofurantoin, macrocrystal-monohydrate, (MACROBID) 100 MG capsule Take 1 capsule (100 mg total) by mouth at bedtime.  . Omega-3 Fatty Acids (FISH OIL OMEGA-3 PO) Give 1 capsule by mouth every morning  . ondansetron (ZOFRAN) 4 MG tablet Take 4 mg by mouth every 6 (six) hours as needed for nausea or vomiting.   Marland Kitchen oxyCODONE-acetaminophen (PERCOCET/ROXICET) 5-325 MG tablet Take 1 tablet by mouth every 6 (six) hours.  . phenazopyridine (PYRIDIUM) 200 MG tablet Take 200 mg by mouth every 8 (eight) hours as needed for pain.  . Polyvinyl Alcohol-Povidone (FRESHKOTE) 2.7-2 % SOLN Apply 1 drop to eye 3 (three) times daily. To both eyes  . triamcinolone (KENALOG) 0.025 % cream Apply to bilateral feet topically two times daily for excessive dry skin   No facility-administered  encounter medications on file as of 07/14/2017.      SIGNIFICANT DIAGNOSTIC EXAMS  PREVIOUS   05-18-15: ct of abdomen and pelvis: 1. Motion and hardware degraded images. 2. Moderate left-sided hydroureteronephrosis secondary to a distal left ureteric 11 x 13 mm calculus. Decreased left-sided renal function. 3. Large burden bilateral renal collecting system stones, including stones within both extrarenal pelves. 4. Cirrhosis with small volume ascites. 5. Proctitis, likely related to C. difficile colitis, given the clinical history. 6. Bladder wall irregularity which could relate to cystitis. Bladder diverticulum, suggesting a component of outlet obstruction. 7.  Skin irregularity superficial to the coccyx. Recommend physical exam correlation to exclude decubitus ulcer. No gross osseous destruction.   NO NEW EXAMS     LABS REVIEWED: PREVIOUS   08-11-16: urine micro-albumin 1.5  09-14-16: chol 142; ldl 60; trig 228; hdl 36 hgb a1c 6.0  09-28-16: wbc 7.5; hgb 11.5; hct 35.1; mcv 102.4; plt 355 glucose 208; bun 36.1; creat 0.92; k+ 5.8; na++ 139; ca 9.5; liver normal albumin 3.5 11-16-16: glucose 97; bun 18.2; creat 0.77; k+ 5.0; na++ 136; ca 9.3 02-14-17: glucose 143; bun 25.9; creat 0.91; k+ 5.1; na++ 140; ca 9.6; liver normal albumin 4.0; tsh 1.01; hgb a1c 6.7; chol 127; ldl 64; trig 143; hdl 34   TODAY:   06-17-17: hgb a1c 6.0        Review of Systems  Constitutional: Negative for malaise/fatigue.  Respiratory: Negative for cough and shortness of breath.   Cardiovascular: Negative for chest pain, palpitations and leg swelling.  Gastrointestinal: Negative for abdominal pain, constipation and heartburn.  Musculoskeletal: Negative for back pain, joint pain and myalgias.  Skin: Negative.   Neurological: Negative for dizziness.  Psychiatric/Behavioral: The patient is not nervous/anxious.     Physical Exam  Constitutional: She appears well-developed and well-nourished. No distress.    Neck: No thyromegaly present.  Cardiovascular: Normal rate, regular rhythm and intact distal pulses.  Murmur heard. 1/6  Pulmonary/Chest: Effort normal and breath sounds normal. No respiratory distress.  Abdominal: Soft. Bowel sounds are normal. She exhibits no distension. There is no tenderness.  Musculoskeletal: She exhibits no edema.  Able to move all extremities   Lymphadenopathy:    She has no cervical adenopathy.  Neurological: She is alert.  Skin: Skin is warm and dry. She is not diaphoretic.  Psychiatric: She has a normal mood and affect.     .  ASSESSMENT/ PLAN:  TODAY:   1. Diabetes mellitus type II insulin dependent:  hgb a1c is 6.0 (prevoius 6.7)  She is presently stable her cbgs are good ; will continue  lantus  12 units every 12 hours  She is on a statin and ace and asa   2. Dyslipidemia associated with type 2 diabetes mellitus: ldl is 64 ; is stable will continue lipitor 10 mg daily and fish oil 1 gm daily    3.  Alzheimer's disease, late onset without behavioral disturbance: no change in status; is presently not taking medications will not make changes will monitor her status.  Her current weight is 155 pounds; which is presently stable will monitor   4.  Chronic constipation:stable will continue linzess 72 mcg daily   PREVIOUS PROBLEMS:   5. Osteoarthritis, primary multiple joints: is stable  pain is presently being managed; will continue tylenol 1 gm daily and percocet 5/325 mg every 6 hours routinely   6. Major depression with psychotic features: is presently stable is presently not taking medications; will monitor   7. Nephrolithiasis; outlet obstruction: is on long term macrobid daily  is followed by urology.   8. Weight loss, non intentional : her current weight is 155 pounds; her weight in April 2018: 162 pounds. Will continue supplements per facility protocol.   9.  Hypertension associated with diabetes :b/p 136/70 is stable; will continue lisinopril 5  mg daily; and lopressor 25 mg twice daily  Asa 325 mg daily        MD is aware of resident's narcotic use and is in agreement with current plan of care. We will attempt to wean  resident as apropriate   Ok Edwards NP [facility]  Contact [phone] Monday through Friday 8am- 5pm  After hours call [phone]

## 2017-07-17 DIAGNOSIS — E1165 Type 2 diabetes mellitus with hyperglycemia: Secondary | ICD-10-CM | POA: Diagnosis not present

## 2017-07-17 DIAGNOSIS — N319 Neuromuscular dysfunction of bladder, unspecified: Secondary | ICD-10-CM | POA: Diagnosis not present

## 2017-07-17 DIAGNOSIS — I10 Essential (primary) hypertension: Secondary | ICD-10-CM | POA: Diagnosis not present

## 2017-07-18 DIAGNOSIS — I10 Essential (primary) hypertension: Secondary | ICD-10-CM | POA: Diagnosis not present

## 2017-07-18 DIAGNOSIS — E1165 Type 2 diabetes mellitus with hyperglycemia: Secondary | ICD-10-CM | POA: Diagnosis not present

## 2017-07-18 DIAGNOSIS — N319 Neuromuscular dysfunction of bladder, unspecified: Secondary | ICD-10-CM | POA: Diagnosis not present

## 2017-07-19 DIAGNOSIS — E1165 Type 2 diabetes mellitus with hyperglycemia: Secondary | ICD-10-CM | POA: Diagnosis not present

## 2017-07-19 DIAGNOSIS — I10 Essential (primary) hypertension: Secondary | ICD-10-CM | POA: Diagnosis not present

## 2017-07-19 DIAGNOSIS — N319 Neuromuscular dysfunction of bladder, unspecified: Secondary | ICD-10-CM | POA: Diagnosis not present

## 2017-07-20 DIAGNOSIS — E1165 Type 2 diabetes mellitus with hyperglycemia: Secondary | ICD-10-CM | POA: Diagnosis not present

## 2017-07-20 DIAGNOSIS — N319 Neuromuscular dysfunction of bladder, unspecified: Secondary | ICD-10-CM | POA: Diagnosis not present

## 2017-07-20 DIAGNOSIS — I10 Essential (primary) hypertension: Secondary | ICD-10-CM | POA: Diagnosis not present

## 2017-07-21 DIAGNOSIS — E1165 Type 2 diabetes mellitus with hyperglycemia: Secondary | ICD-10-CM | POA: Diagnosis not present

## 2017-07-21 DIAGNOSIS — N319 Neuromuscular dysfunction of bladder, unspecified: Secondary | ICD-10-CM | POA: Diagnosis not present

## 2017-07-21 DIAGNOSIS — I10 Essential (primary) hypertension: Secondary | ICD-10-CM | POA: Diagnosis not present

## 2017-07-25 DIAGNOSIS — E1165 Type 2 diabetes mellitus with hyperglycemia: Secondary | ICD-10-CM | POA: Diagnosis not present

## 2017-07-25 DIAGNOSIS — N319 Neuromuscular dysfunction of bladder, unspecified: Secondary | ICD-10-CM | POA: Diagnosis not present

## 2017-07-25 DIAGNOSIS — I10 Essential (primary) hypertension: Secondary | ICD-10-CM | POA: Diagnosis not present

## 2017-07-26 DIAGNOSIS — N319 Neuromuscular dysfunction of bladder, unspecified: Secondary | ICD-10-CM | POA: Diagnosis not present

## 2017-07-26 DIAGNOSIS — E1165 Type 2 diabetes mellitus with hyperglycemia: Secondary | ICD-10-CM | POA: Diagnosis not present

## 2017-07-26 DIAGNOSIS — I10 Essential (primary) hypertension: Secondary | ICD-10-CM | POA: Diagnosis not present

## 2017-07-27 DIAGNOSIS — E1165 Type 2 diabetes mellitus with hyperglycemia: Secondary | ICD-10-CM | POA: Diagnosis not present

## 2017-07-27 DIAGNOSIS — N319 Neuromuscular dysfunction of bladder, unspecified: Secondary | ICD-10-CM | POA: Diagnosis not present

## 2017-07-27 DIAGNOSIS — I10 Essential (primary) hypertension: Secondary | ICD-10-CM | POA: Diagnosis not present

## 2017-07-28 DIAGNOSIS — I10 Essential (primary) hypertension: Secondary | ICD-10-CM | POA: Diagnosis not present

## 2017-07-28 DIAGNOSIS — E1165 Type 2 diabetes mellitus with hyperglycemia: Secondary | ICD-10-CM | POA: Diagnosis not present

## 2017-07-28 DIAGNOSIS — N319 Neuromuscular dysfunction of bladder, unspecified: Secondary | ICD-10-CM | POA: Diagnosis not present

## 2017-08-01 DIAGNOSIS — I10 Essential (primary) hypertension: Secondary | ICD-10-CM | POA: Diagnosis not present

## 2017-08-01 DIAGNOSIS — N319 Neuromuscular dysfunction of bladder, unspecified: Secondary | ICD-10-CM | POA: Diagnosis not present

## 2017-08-01 DIAGNOSIS — E1165 Type 2 diabetes mellitus with hyperglycemia: Secondary | ICD-10-CM | POA: Diagnosis not present

## 2017-08-02 DIAGNOSIS — E1165 Type 2 diabetes mellitus with hyperglycemia: Secondary | ICD-10-CM | POA: Diagnosis not present

## 2017-08-02 DIAGNOSIS — N319 Neuromuscular dysfunction of bladder, unspecified: Secondary | ICD-10-CM | POA: Diagnosis not present

## 2017-08-02 DIAGNOSIS — I10 Essential (primary) hypertension: Secondary | ICD-10-CM | POA: Diagnosis not present

## 2017-08-03 DIAGNOSIS — E1165 Type 2 diabetes mellitus with hyperglycemia: Secondary | ICD-10-CM | POA: Diagnosis not present

## 2017-08-03 DIAGNOSIS — I10 Essential (primary) hypertension: Secondary | ICD-10-CM | POA: Diagnosis not present

## 2017-08-03 DIAGNOSIS — N319 Neuromuscular dysfunction of bladder, unspecified: Secondary | ICD-10-CM | POA: Diagnosis not present

## 2017-08-04 DIAGNOSIS — E1165 Type 2 diabetes mellitus with hyperglycemia: Secondary | ICD-10-CM | POA: Diagnosis not present

## 2017-08-04 DIAGNOSIS — N319 Neuromuscular dysfunction of bladder, unspecified: Secondary | ICD-10-CM | POA: Diagnosis not present

## 2017-08-04 DIAGNOSIS — I10 Essential (primary) hypertension: Secondary | ICD-10-CM | POA: Diagnosis not present

## 2017-08-05 ENCOUNTER — Other Ambulatory Visit: Payer: Self-pay

## 2017-08-05 DIAGNOSIS — I10 Essential (primary) hypertension: Secondary | ICD-10-CM | POA: Diagnosis not present

## 2017-08-05 DIAGNOSIS — E1165 Type 2 diabetes mellitus with hyperglycemia: Secondary | ICD-10-CM | POA: Diagnosis not present

## 2017-08-05 DIAGNOSIS — N319 Neuromuscular dysfunction of bladder, unspecified: Secondary | ICD-10-CM | POA: Diagnosis not present

## 2017-08-05 MED ORDER — OXYCODONE-ACETAMINOPHEN 5-325 MG PO TABS: 1.0000 | ORAL_TABLET | Freq: Four times a day (QID) | ORAL | 0 refills | Status: DC

## 2017-08-05 NOTE — Telephone Encounter (Signed)
Rx faxed to [facility] (P) [phone], (F) [phone] 

## 2017-08-11 ENCOUNTER — Non-Acute Institutional Stay (SKILLED_NURSING_FACILITY): Payer: Medicare Other

## 2017-08-11 DIAGNOSIS — Z0000 Encounter for general adult medical examination without abnormal findings: Secondary | ICD-10-CM

## 2017-08-11 NOTE — Progress Notes (Signed)
Subjective:   Ana Newman is a 82 y.o. female who presents for Medicare Annual (Subsequent) preventive examination at [facility]  Last AWV-07/29/2016    Objective:     Vitals: BP 135/70 (BP Location: Right Arm, Patient Position: Supine)   Pulse 75   Temp 98.2 F (36.8 C) (Oral)   Ht 4\' 11"  (1.499 m)   Wt 155 lb (70.3 kg)   BMI 31.31 kg/m   Body mass index is 31.31 kg/m.  Advanced Directives 08/11/2017 07/14/2017 06/16/2017 05/09/2017 05/04/2017 04/28/2017 04/11/2017  Does Patient Have a Medical Advance Directive? Yes Yes Yes Yes Yes Yes Yes  Type of Advance Directive Out of facility DNR (pink MOST or yellow form) Out of facility DNR (pink MOST or yellow form) Out of facility DNR (pink MOST or yellow form) Out of facility DNR (pink MOST or yellow form) Out of facility DNR (pink MOST or yellow form) Out of facility DNR (pink MOST or yellow form) Out of facility DNR (pink MOST or yellow form)  Does patient want to make changes to medical advance directive? No - Patient declined No - Patient declined No - Patient declined No - Patient declined No - Patient declined No - Patient declined No - Patient declined  Copy of [place] in [place]  Would patient like information on creating a medical advance directive? - - - - - - -  Pre-existing out of facility DNR order (yellow form or pink MOST form) Pink MOST form placed in chart (order not valid for inpatient use) Pink MOST form placed in chart (order not valid for inpatient use) Pink MOST form placed in chart (order not valid for inpatient use) Pink MOST form placed in chart (order not valid for inpatient use) Pink MOST form placed in chart (order not valid for inpatient use) Pink MOST form placed in chart (order not valid for inpatient use) Pink MOST form placed in chart (order not valid for inpatient use)    Tobacco Social History   Tobacco Use  Smoking Status Never Smoker  Smokeless Tobacco  Never Used     Counseling given: Not Answered   Clinical Intake:  Pre-visit preparation completed: No  Pain : No/denies pain     Nutritional Risks: None Diabetes: No  How often do you need to have someone help you when you read instructions, pamphlets, or other written materials from your doctor or pharmacy?: 3 - Sometimes  Interpreter Needed?: No  Information entered by :: Tyson Dense, RN  Past Medical History:  Diagnosis Date  . Acute pyelonephritis   . Alzheimer's disease   . Bladder outlet obstruction   . Depression   . Diarrhea   . Environmental and seasonal allergies   . Essential hypertension, benign   . Foley catheter in place   . GERD (gastroesophageal reflux disease)   . History of urinary tract infection   . Hypertension   . Hypothyroidism   . Insomnia   . Nephrolithiasis   . Osteoarthritis   . Reflux esophagitis   . Rickets, active   . Sepsis ([place])   . Unspecified constipation   . Unspecified psychosis   . Unspecified vitamin D deficiency    Past Surgical History:  Procedure Laterality Date  . ABDOMINAL HYSTERECTOMY    . CYSTOSCOPY W/ URETERAL STENT PLACEMENT Bilateral 05/18/2015   Procedure: CYSTOSCOPY WITH RETROGRADE PYELOGRAM/URETERAL STENT PLACEMENT;  Surgeon: Carolan Clines, MD;  Location: [facility];  Service: Urology;  Laterality: Bilateral;  . CYSTOSCOPY W/ URETERAL STENT REMOVAL Bilateral 09/12/2015   Procedure: CYSTOSCOPY WITH STENT REMOVAL;  Surgeon: Carolan Clines, MD;  Location: [facility];  Service: Urology;  Laterality: Bilateral;  1 HOUR  . CYSTOSCOPY WITH RETROGRADE PYELOGRAM, URETEROSCOPY AND STENT PLACEMENT Bilateral 07/18/2015   Procedure: CYSTOSCOPY REMOVE LEFT DOUBLE J STENT LEFT RETROGRADE PYELOGRAM, LEFT URETEROSCOPY, PYELOSCOPY, REPLACEMENT OF LEFT DOUBLE J STENT;  Surgeon: Carolan Clines, MD;  Location: [facility];  Service: Urology;  Laterality: Bilateral;  . HOLMIUM LASER APPLICATION Bilateral 06/18/15   Procedure:  HOLMIUM LASER OF LEFT LOWER URETERAL STONE, LEFT PYELOSCOPY, REPLACE LEFT DOUBLE J STENT REMOVE RIGHT DOUBLE J STENT RIGHT RETROGRADE PYELOGRAM, REPLACEMENT RIGHT DOUBLE J STENT ;  Surgeon: Carolan Clines, MD;  Location: [facility];  Service: Urology;  Laterality: Bilateral;  . KNEE ARTHROSCOPY Left 1997   Family History  Family history unknown: Yes   Social History   Socioeconomic History  . Marital status: Married    Spouse name: Not on file  . Number of children: Not on file  . Years of education: Not on file  . Highest education level: Not on file  Occupational History  . Not on file  Social Needs  . Financial resource strain: Not hard at all  . Food insecurity:    Worry: Never true    Inability: Never true  . Transportation needs:    Medical: No    Non-medical: No  Tobacco Use  . Smoking status: Never Smoker  . Smokeless tobacco: Never Used  Substance and Sexual Activity  . Alcohol use: No  . Drug use: No  . Sexual activity: Not on file  Lifestyle  . Physical activity:    Days per week: 0 days    Minutes per session: 0 min  . Stress: Not at all  Relationships  . Social connections:    Talks on phone: Once a week    Gets together: Once a week    Attends religious service: Never    Active member of club or organization: No    Attends meetings of clubs or organizations: Never    Relationship status: Married  Other Topics Concern  . Not on file  Social History Narrative  . Not on file    Outpatient Encounter Medications as of 08/11/2017  Medication Sig  . acetaminophen (TYLENOL) 325 MG tablet Take 650 mg by mouth every 6 (six) hours as needed for moderate pain.  Marland Kitchen acetaminophen (TYLENOL) 500 MG tablet Take 1,000 mg by mouth daily. Pain management  . aspirin 325 MG tablet Take 325 mg by mouth daily.  Marland Kitchen atorvastatin (LIPITOR) 10 MG tablet Take 10 mg by mouth daily at 6 PM.  . cholecalciferol (VITAMIN D) 1000 UNITS tablet Take 2,000 Units by mouth daily.  .  Insulin Detemir (LEVEMIR FLEXPEN) 100 UNIT/ML Pen Inject 12 Units into the skin every morning. And 12 units subcutaneously in the evening  . linaclotide (LINZESS) 72 MCG capsule Take 72 mcg by mouth daily.  Marland Kitchen lisinopril (PRINIVIL,ZESTRIL) 5 MG tablet Take 5 mg by mouth daily.   Marland Kitchen loratadine (CLARITIN) 10 MG tablet Take 10 mg by mouth daily as needed for allergies.   . metoprolol tartrate (LOPRESSOR) 25 MG tablet Take 25 mg by mouth 2 (two) times daily. For HTN  Hold if B/P < 100/60 or HR < 60  . Multiple Vitamin (MULTIVITAMIN) tablet Take 1 tablet by mouth daily. Wound healing  . nitrofurantoin, macrocrystal-monohydrate, (MACROBID)  100 MG capsule Take 1 capsule (100 mg total) by mouth at bedtime.  . Omega-3 Fatty Acids (FISH OIL OMEGA-3 PO) Give 1 capsule by mouth every morning  . ondansetron (ZOFRAN) 4 MG tablet Take 4 mg by mouth every 6 (six) hours as needed for nausea or vomiting.   Marland Kitchen oxyCODONE-acetaminophen (PERCOCET/ROXICET) 5-325 MG tablet Take 1 tablet by mouth every 6 (six) hours.  . phenazopyridine (PYRIDIUM) 200 MG tablet Take 200 mg by mouth every 8 (eight) hours as needed for pain.  . Polyvinyl Alcohol-Povidone (FRESHKOTE) 2.7-2 % SOLN Apply 1 drop to eye 3 (three) times daily. To both eyes  . triamcinolone (KENALOG) 0.025 % cream Apply to bilateral feet topically two times daily for excessive dry skin   No facility-administered encounter medications on file as of 08/11/2017.     Activities of Daily Living In your present state of health, do you have any difficulty performing the following activities: 08/11/2017  Hearing? N  Vision? N  Difficulty concentrating or making decisions? Y  Walking or climbing stairs? Y  Dressing or bathing? Y  Doing errands, shopping? Y  Preparing Food and eating ? Y  Using the Toilet? Y  In the past six months, have you accidently leaked urine? N  Do you have problems with loss of bowel control? N  Managing your Medications? Y  Managing your  Finances? Y  Housekeeping or managing your Housekeeping? Y  Some recent data might be hidden    Patient Care Team: Gildardo Cranker, DO as PCP - General (Internal Medicine) Nyoka Cowden Phylis Bougie, NP as Nurse Practitioner ([place]) [facility], [place] ([place])    Assessment:   This is a routine wellness examination for Ber[place].  Exercise Activities and Dietary recommendations Current Exercise Habits: The patient does not participate in regular exercise at present, Exercise limited by: neurologic condition(s);orthopedic condition(s)  Goals    None      Fall Risk Fall Risk  08/11/2017 07/29/2016  Falls in the past year? No No   Is the patient's home free of loose throw rugs in walkways, pet beds, electrical cords, etc?   yes      Grab bars in the bathroom? yes      Handrails on the stairs?   yes      Adequate lighting?   yes  Depression Screen PHQ 2/9 Scores 08/11/2017 07/29/2016  PHQ - 2 Score 0 0     Cognitive Function     6CIT Screen 08/11/2017 07/29/2016  What Year? 0 points 0 points  What month? 0 points 0 points  What time? 0 points 0 points  Count back from 20 0 points 0 points  Months in reverse 4 points 4 points  Repeat phrase 10 points 6 points  Total Score 14 10    Immunization History  Administered Date(s) Administered  . Hepatitis B 11/12/2015  . Influenza Whole 11/09/2012  . Influenza-Unspecified 11/12/2013, 11/11/2014, 11/12/2015, 12/02/2016  . PPD Test 10/10/2015, 10/17/2015  . Pneumococcal-Unspecified 01/03/2008    Qualifies for Shingles Vaccine? Not in past records  Screening Tests Health Maintenance  Topic Date Due  . INFLUENZA VACCINE  09/01/2017  . HEMOGLOBIN A1C  12/18/2017  . FOOT EXAM  06/30/2018  . TETANUS/TDAP  05/28/2024  . DEXA SCAN  Completed  . PNA vac Low Risk Adult  Completed  . OPHTHALMOLOGY EXAM  Discontinued    Cancer Screenings: Lung: Low Dose CT Chest recommended if Age 68-80 years, 30  pack-year currently smoking OR  have quit w/in 15years. Patient does not qualify. Breast:  Up to date on Mammogram? Yes   Up to date of Bone Density/Dexa? Yes Colorectal: up to date  Additional Screenings:  Hepatitis C Screening: declined     Plan:    I have personally reviewed and addressed the Medicare Annual Wellness questionnaire and have noted the following in the patient's chart:  A. Medical and social history B. Use of alcohol, tobacco or illicit drugs  C. Current medications and supplements D. Functional ability and status E.  Nutritional status F.  Physical activity G. Advance directives H. List of other physicians I.  Hospitalizations, surgeries, and ER visits in previous 12 months J.  [place] to include hearing, vision, cognitive, depression L. Referrals and appointments - none  In addition, I have reviewed and discussed with patient certain preventive protocols, quality metrics, and best practice recommendations. A written personalized care plan for preventive services as well as general preventive health recommendations were provided to patient.  See attached scanned questionnaire for additional information.   Signed,   Tyson Dense, RN Nurse Health Advisor  Patient Concerns: None

## 2017-08-11 NOTE — Patient Instructions (Signed)
Ana Newman , Thank you for taking time to come for your Medicare Wellness Visit. I appreciate your ongoing commitment to your health goals. Please review the following plan we discussed and let me know if I can assist you in the future.   Screening recommendations/referrals: Colonoscopy excluded, over age 80 Mammogram excluded, over age 80 Bone Density up to date Recommended yearly ophthalmology/optometry visit for glaucoma screening and checkup Recommended yearly dental visit for hygiene and checkup  Vaccinations: Influenza vaccine up to date, due 2019 fall season Pneumococcal vaccine up to date, completed Tdap vaccine up to date, due 05/28/2024 Shingles vaccine not in past records    Advanced directives: in chart  Conditions/risks identified: none  Next appointment: Dr. Eulas Post makes rounds   Preventive Care 58 Years and Older, Female Preventive care refers to lifestyle choices and visits with your health care provider that can promote health and wellness. What does preventive care include?  A yearly physical exam. This is also called an annual well check.  Dental exams once or twice a year.  Routine eye exams. Ask your health care provider how often you should have your eyes checked.  Personal lifestyle choices, including:  Daily care of your teeth and gums.  Regular physical activity.  Eating a healthy diet.  Avoiding tobacco and drug use.  Limiting alcohol use.  Practicing safe sex.  Taking low-dose aspirin every day.  Taking vitamin and mineral supplements as recommended by your health care provider. What happens during an annual well check? The services and screenings done by your health care provider during your annual well check will depend on your age, overall health, lifestyle risk factors, and family history of disease. Counseling  Your health care provider may ask you questions about your:  Alcohol use.  Tobacco use.  Drug use.  Emotional  well-being.  Home and relationship well-being.  Sexual activity.  Eating habits.  History of falls.  Memory and ability to understand (cognition).  Work and work Statistician.  Reproductive health. Screening  You may have the following tests or measurements:  Height, weight, and BMI.  Blood pressure.  Lipid and cholesterol levels. These may be checked every 5 years, or more frequently if you are over 80 years old.  Skin check.  Lung cancer screening. You may have this screening every year starting at age 80 if you have a 30-pack-year history of smoking and currently smoke or have quit within the past 15 years.  Fecal occult blood test (FOBT) of the stool. You may have this test every year starting at age 80.  Flexible sigmoidoscopy or colonoscopy. You may have a sigmoidoscopy every 5 years or a colonoscopy every 10 years starting at age 80.  Hepatitis C blood test.  Hepatitis B blood test.  Sexually transmitted disease (STD) testing.  Diabetes screening. This is done by checking your blood sugar (glucose) after you have not eaten for a while (fasting). You may have this done every 1-3 years.  Bone density scan. This is done to screen for osteoporosis. You may have this done starting at age 80.  Mammogram. This may be done every 1-2 years. Talk to your health care provider about how often you should have regular mammograms. Talk with your health care provider about your test results, treatment options, and if necessary, the need for more tests. Vaccines  Your health care provider may recommend certain vaccines, such as:  Influenza vaccine. This is recommended every year.  Tetanus, diphtheria, and acellular pertussis (Tdap,  Td) vaccine. You may need a Td booster every 10 years.  Zoster vaccine. You may need this after age 20.  Pneumococcal 13-valent conjugate (PCV13) vaccine. One dose is recommended after age 70.  Pneumococcal polysaccharide (PPSV23) vaccine. One  dose is recommended after age 52. Talk to your health care provider about which screenings and vaccines you need and how often you need them. This information is not intended to replace advice given to you by your health care provider. Make sure you discuss any questions you have with your health care provider. Document Released: 02/14/2015 Document Revised: 10/08/2015 Document Reviewed: 11/19/2014 Elsevier Interactive Patient Education  2017 [place] Prevention in the Home Falls can cause injuries. They can happen to people of all ages. There are many things you can do to make your home safe and to help prevent falls. What can I do on the outside of my home?  Regularly fix the edges of walkways and driveways and fix any cracks.  Remove anything that might make you trip as you walk through a door, such as a raised step or threshold.  Trim any bushes or trees on the path to your home.  Use bright outdoor lighting.  Clear any walking paths of anything that might make someone trip, such as rocks or tools.  Regularly check to see if handrails are loose or broken. Make sure that both sides of any steps have handrails.  Any raised decks and porches should have guardrails on the edges.  Have any leaves, snow, or ice cleared regularly.  Use sand or salt on walking paths during winter.  Clean up any spills in your garage right away. This includes oil or grease spills. What can I do in the bathroom?  Use night lights.  Install grab bars by the toilet and in the tub and shower. Do not use towel bars as grab bars.  Use non-skid mats or decals in the tub or shower.  If you need to sit down in the shower, use a plastic, non-slip stool.  Keep the floor dry. Clean up any water that spills on the floor as soon as it happens.  Remove soap buildup in the tub or shower regularly.  Attach bath mats securely with double-sided non-slip rug tape.  Do not have throw rugs and other  things on the floor that can make you trip. What can I do in the bedroom?  Use night lights.  Make sure that you have a light by your bed that is easy to reach.  Do not use any sheets or blankets that are too big for your bed. They should not hang down onto the floor.  Have a firm chair that has side arms. You can use this for support while you get dressed.  Do not have throw rugs and other things on the floor that can make you trip. What can I do in the kitchen?  Clean up any spills right away.  Avoid walking on wet floors.  Keep items that you use a lot in easy-to-reach places.  If you need to reach something above you, use a strong step stool that has a grab bar.  Keep electrical cords out of the way.  Do not use floor polish or wax that makes floors slippery. If you must use wax, use non-skid floor wax.  Do not have throw rugs and other things on the floor that can make you trip. What can I do with my stairs?  Do not  leave any items on the stairs.  Make sure that there are handrails on both sides of the stairs and use them. Fix handrails that are broken or loose. Make sure that handrails are as long as the stairways.  Check any carpeting to make sure that it is firmly attached to the stairs. Fix any carpet that is loose or worn.  Avoid having throw rugs at the top or bottom of the stairs. If you do have throw rugs, attach them to the floor with carpet tape.  Make sure that you have a light switch at the top of the stairs and the bottom of the stairs. If you do not have them, ask someone to add them for you. What else can I do to help prevent falls?  Wear shoes that:  Do not have high heels.  Have rubber bottoms.  Are comfortable and fit you well.  Are closed at the toe. Do not wear sandals.  If you use a stepladder:  Make sure that it is fully opened. Do not climb a closed stepladder.  Make sure that both sides of the stepladder are locked into place.  Ask  someone to hold it for you, if possible.  Clearly mark and make sure that you can see:  Any grab bars or handrails.  First and last steps.  Where the edge of each step is.  Use tools that help you move around (mobility aids) if they are needed. These include:  Canes.  Walkers.  Scooters.  Crutches.  Turn on the lights when you go into a dark area. Replace any light bulbs as soon as they burn out.  Set up your furniture so you have a clear path. Avoid moving your furniture around.  If any of your floors are uneven, fix them.  If there are any pets around you, be aware of where they are.  Review your medicines with your doctor. Some medicines can make you feel dizzy. This can increase your chance of falling. Ask your doctor what other things that you can do to help prevent falls. This information is not intended to replace advice given to you by your health care provider. Make sure you discuss any questions you have with your health care provider. Document Released: 11/14/2008 Document Revised: 06/26/2015 Document Reviewed: 02/22/2014 Elsevier Interactive Patient Education  2017 [employer].

## 2017-08-23 ENCOUNTER — Non-Acute Institutional Stay (SKILLED_NURSING_FACILITY): Payer: Medicare Other | Admitting: Adult Health

## 2017-08-23 ENCOUNTER — Encounter: Payer: Self-pay | Admitting: Adult Health

## 2017-08-23 DIAGNOSIS — E1169 Type 2 diabetes mellitus with other specified complication: Secondary | ICD-10-CM | POA: Diagnosis not present

## 2017-08-23 DIAGNOSIS — I10 Essential (primary) hypertension: Secondary | ICD-10-CM

## 2017-08-23 DIAGNOSIS — N200 Calculus of kidney: Secondary | ICD-10-CM

## 2017-08-23 DIAGNOSIS — E785 Hyperlipidemia, unspecified: Secondary | ICD-10-CM | POA: Diagnosis not present

## 2017-08-23 DIAGNOSIS — F323 Major depressive disorder, single episode, severe with psychotic features: Secondary | ICD-10-CM | POA: Diagnosis not present

## 2017-08-23 DIAGNOSIS — F0280 Dementia in other diseases classified elsewhere without behavioral disturbance: Secondary | ICD-10-CM | POA: Diagnosis not present

## 2017-08-23 DIAGNOSIS — E1159 Type 2 diabetes mellitus with other circulatory complications: Secondary | ICD-10-CM

## 2017-08-23 DIAGNOSIS — K5909 Other constipation: Secondary | ICD-10-CM

## 2017-08-23 DIAGNOSIS — G301 Alzheimer's disease with late onset: Secondary | ICD-10-CM

## 2017-08-23 DIAGNOSIS — E119 Type 2 diabetes mellitus without complications: Secondary | ICD-10-CM | POA: Diagnosis not present

## 2017-08-23 DIAGNOSIS — Z794 Long term (current) use of insulin: Secondary | ICD-10-CM | POA: Diagnosis not present

## 2017-08-23 NOTE — Progress Notes (Signed)
Provider:  Ok Edwards, NP Location:  [place] Room Number: [place] of Service:  SNF ([phone])   PCP: Gildardo Cranker, DO Patient Care Team: Gildardo Cranker, DO as PCP - General (Internal Medicine) Nyoka Cowden Phylis Bougie, NP as Nurse Practitioner ([place]) [facility], [place] ([place])  Extended Emergency Contact Information Primary Emergency Contact: Smith,Christie Address: [address] [address] [place] of [place] Phone: [phone] Relation: Spouse Secondary Emergency Contact: Allen Norris [place] of [place] Phone: [phone] Mobile Phone: [phone] Relation: Son  Code Status:  DNR (Most form updated 05/04/17) Goals of Care: Advanced Directive information Advanced Directives 08/23/2017  Does Patient Have a Medical Advance Directive? Yes  Type of Advance Directive Out of facility DNR (pink MOST or yellow form)  Does patient want to make changes to medical advance directive? No - Patient declined  Copy of [place] in Chart? -  Would patient like information on creating a medical advance directive? -  Pre-existing out of facility DNR order (yellow form or pink MOST form) Pink MOST form placed in chart (order not valid for inpatient use)      No Known Allergies   Chief Complaint  Patient presents with  . Annual Exam    Yearly exam    HPI: Patient is a 82 y.o. female seen today for an annual comprehensive examination.she has not had a significant change in her status over this past year. She has not been hospitalized over the past year. She continues to be followed for her chronic illnesses including: hypertension; diabetes; dyslipidemia. She denies any uncontrolled pain; no cough; no changes in appetite. There are no nursing concerns at this time.   Past Medical History:  Diagnosis Date  . Acute pyelonephritis   . Alzheimer's disease   . Bladder outlet  obstruction   . Depression   . Diarrhea   . Environmental and seasonal allergies   . Essential hypertension, benign   . Foley catheter in place   . GERD (gastroesophageal reflux disease)   . History of urinary tract infection   . Hypertension   . Hypothyroidism   . Insomnia   . Nephrolithiasis   . Osteoarthritis   . Reflux esophagitis   . Rickets, active   . Sepsis ([place])   . Unspecified constipation   . Unspecified psychosis   . Unspecified vitamin D deficiency    Past Surgical History:  Procedure Laterality Date  . ABDOMINAL HYSTERECTOMY    . CYSTOSCOPY W/ URETERAL STENT PLACEMENT Bilateral 05/18/2015   Procedure: CYSTOSCOPY WITH RETROGRADE PYELOGRAM/URETERAL STENT PLACEMENT;  Surgeon: Carolan Clines, MD;  Location: [facility];  Service: Urology;  Laterality: Bilateral;  . CYSTOSCOPY W/ URETERAL STENT REMOVAL Bilateral 09/12/2015   Procedure: CYSTOSCOPY WITH STENT REMOVAL;  Surgeon: Carolan Clines, MD;  Location: [facility];  Service: Urology;  Laterality: Bilateral;  1 HOUR  . CYSTOSCOPY WITH RETROGRADE PYELOGRAM, URETEROSCOPY AND STENT PLACEMENT Bilateral 07/18/2015   Procedure: CYSTOSCOPY REMOVE LEFT DOUBLE J STENT LEFT RETROGRADE PYELOGRAM, LEFT URETEROSCOPY, PYELOSCOPY, REPLACEMENT OF LEFT DOUBLE J STENT;  Surgeon: Carolan Clines, MD;  Location: [facility];  Service: Urology;  Laterality: Bilateral;  . HOLMIUM LASER APPLICATION Bilateral 0/86/7619   Procedure: HOLMIUM LASER OF LEFT LOWER URETERAL STONE, LEFT PYELOSCOPY, REPLACE LEFT DOUBLE J STENT REMOVE RIGHT DOUBLE J STENT RIGHT RETROGRADE PYELOGRAM, REPLACEMENT RIGHT DOUBLE J STENT ;  Surgeon: Carolan Clines, MD;  Location: [facility];  Service: Urology;  Laterality: Bilateral;  . KNEE ARTHROSCOPY Left 1997    reports that she has never smoked. She has never used smokeless tobacco. She reports that she does not drink alcohol or use drugs. Social History   Socioeconomic History  . Marital status: Married    Spouse name: Not  on file  . Number of children: Not on file  . Years of education: Not on file  . Highest education level: Not on file  Occupational History  . Not on file  Social Needs  . Financial resource strain: Not hard at all  . Food insecurity:    Worry: Never true    Inability: Never true  . Transportation needs:    Medical: No    Non-medical: No  Tobacco Use  . Smoking status: Never Smoker  . Smokeless tobacco: Never Used  Substance and Sexual Activity  . Alcohol use: No  . Drug use: No  . Sexual activity: Not on file  Lifestyle  . Physical activity:    Days per week: 0 days    Minutes per session: 0 min  . Stress: Not at all  Relationships  . Social connections:    Talks on phone: Once a week    Gets together: Once a week    Attends religious service: Never    Active member of club or organization: No    Attends meetings of clubs or organizations: Never    Relationship status: Married  . Intimate partner violence:    Fear of current or ex partner: No    Emotionally abused: No    Physically abused: No    Forced sexual activity: No  Other Topics Concern  . Not on file  Social History Narrative  . Not on file   Family History  Family history unknown: Yes       Vitals:   08/23/17 1102  Weight: 155 lb 3.2 oz (70.4 kg)  Height: 4\' 11"  (1.499 m)   Body mass index is 31.35 kg/m.  Allergies as of 08/23/2017   No Known Allergies     Medication List        Accurate as of 08/23/17 11:20 AM. Always use your most recent med list.          acetaminophen 500 MG tablet Commonly known as:  TYLENOL Take 1,000 mg by mouth daily. Pain management   acetaminophen 325 MG tablet Commonly known as:  TYLENOL Take 650 mg by mouth every 6 (six) hours as needed for moderate pain.   aspirin 325 MG tablet Take 325 mg by mouth daily.   atorvastatin 10 MG tablet Commonly known as:  LIPITOR Take 10 mg by mouth daily at 6 PM.   cholecalciferol 1000 units tablet Commonly  known as:  VITAMIN D Take 2,000 Units by mouth daily.   FISH OIL OMEGA-3 PO Give 1 capsule by mouth every morning   FRESHKOTE 2.7-2 % Soln Generic drug:  Polyvinyl Alcohol-Povidone Apply 1 drop to eye 3 (three) times daily. To both eyes   LEVEMIR FLEXPEN 100 UNIT/ML Pen Generic drug:  Insulin Detemir Inject 12 Units into the skin every morning. And 12 units subcutaneously in the evening   LINZESS 72 MCG capsule Generic drug:  linaclotide Take 72 mcg by mouth daily.   lisinopril 5 MG tablet Commonly known as:  PRINIVIL,ZESTRIL Take 5 mg by mouth daily.   metoprolol tartrate 25 MG tablet Commonly known as:  LOPRESSOR Take 25 mg by mouth 2 (two) times  daily. For HTN  Hold if B/P < 100/60 or HR < 60   multivitamin tablet Take 1 tablet by mouth daily. Wound healing   nitrofurantoin (macrocrystal-monohydrate) 100 MG capsule Commonly known as:  MACROBID Take 1 capsule (100 mg total) by mouth at bedtime.   nystatin powder Generic drug:  nystatin Apply to bilateral breast topically two times daily for rash   oxyCODONE-acetaminophen 5-325 MG tablet Commonly known as:  PERCOCET/ROXICET Take 1 tablet by mouth every 6 (six) hours.   triamcinolone 0.025 % cream Commonly known as:  KENALOG Apply to bilateral feet topically two times daily for excessive dry skin        SIGNIFICANT DIAGNOSTIC EXAMS PREVIOUS   05-18-15: ct of abdomen and pelvis: 1. Motion and hardware degraded images. 2. Moderate left-sided hydroureteronephrosis secondary to a distal left ureteric 11 x 13 mm calculus. Decreased left-sided renal function. 3. Large burden bilateral renal collecting system stones, including stones within both extrarenal pelves. 4. Cirrhosis with small volume ascites. 5. Proctitis, likely related to C. difficile colitis, given the clinical history. 6. Bladder wall irregularity which could relate to cystitis. Bladder diverticulum, suggesting a component of outlet obstruction. 7.  Skin irregularity superficial to the coccyx. Recommend physical exam correlation to exclude decubitus ulcer. No gross osseous destruction.   NO NEW EXAMS     LABS REVIEWED: PREVIOUS   08-11-16: urine micro-albumin 1.5  09-14-16: chol 142; ldl 60; trig 228; hdl 36 hgb a1c 6.0  09-28-16: wbc 7.5; hgb 11.5; hct 35.1; mcv 102.4; plt 355 glucose 208; bun 36.1; creat 0.92; k+ 5.8; na++ 139; ca 9.5; liver normal albumin 3.5 11-16-16: glucose 97; bun 18.2; creat 0.77; k+ 5.0; na++ 136; ca 9.3 02-14-17: glucose 143; bun 25.9; creat 0.91; k+ 5.1; na++ 140; ca 9.6; liver normal albumin 4.0; tsh 1.01; hgb a1c 6.7; chol 127; ldl 64; trig 143; hdl 34  06-17-17: hgb a1c 6.0    NO NEW LABS      Review of Systems  Constitutional: Negative for malaise/fatigue.  Respiratory: Negative for cough and shortness of breath.   Cardiovascular: Negative for chest pain, palpitations and leg swelling.  Gastrointestinal: Negative for abdominal pain, constipation and heartburn.  Musculoskeletal: Negative for back pain, joint pain and myalgias.  Skin: Negative.   Neurological: Negative for dizziness.  Psychiatric/Behavioral: The patient is not nervous/anxious.      Physical Exam  Constitutional: She is oriented to person, place, and time. She appears well-developed and well-nourished. No distress.  HENT:  Head: Normocephalic.  Right Ear: External ear normal.  Left Ear: External ear normal.  Mouth/Throat: Oropharynx is clear and moist.  Eyes: Pupils are equal, round, and reactive to light.  Neck: Neck supple. No thyromegaly present.  Cardiovascular: Normal rate, regular rhythm and intact distal pulses.  Murmur heard. 1/6  Pulmonary/Chest: Effort normal and breath sounds normal. No respiratory distress.  Abdominal: Soft. Bowel sounds are normal. She exhibits no distension. There is no tenderness.  Musculoskeletal: Normal range of motion. She exhibits no edema.  Lymphadenopathy:    She has no cervical adenopathy.   Neurological: She is alert and oriented to person, place, and time.  Skin: Skin is warm and dry. She is not diaphoretic.  Psychiatric: She has a normal mood and affect.   .  ASSESSMENT/ PLAN:  TODAY:   1. Diabetes mellitus type II insulin dependent:  hgb a1c is 6.0 (prevoius 6.7)  She is presently stable her cbgs are good ; will continue  lantus  12  units every 12 hours  She is on a statin and ace and asa   2. Dyslipidemia associated with type 2 diabetes mellitus: ldl is 64 ; is stable will continue lipitor 10 mg daily and fish oil 1 gm daily    3.  Alzheimer's disease, late onset without behavioral disturbance: no change in status; is presently not taking medications will not make changes will monitor her status.  Her current weight is 155 pounds; which is presently stable will monitor   4.  Chronic constipation:stable will continue linzess 72 mcg daily   5. Osteoarthritis, primary multiple joints: is stable  pain is presently being managed; will continue tylenol 1 gm daily and percocet 5/325 mg every 6 hours routinely   6. Major depression with psychotic features: is presently stable is presently not taking medications; will monitor   7. Nephrolithiasis; outlet obstruction: is on long term macrobid daily  is followed by urology.   8. Weight loss, non intentional : her current weight is 155 pounds; her weight in April 2018: 162 pounds. Will continue supplements per facility protocol.   9.  Hypertension associated with diabetes :b/p 136/70 is stable; will continue lisinopril 5 mg daily; and lopressor 25 mg twice daily  Asa 325 mg daily  Will check cbc; cmp; lipids; hgb a1c and urine for micro-albumin      MD is aware of resident's narcotic use and is in agreement with current plan of care. We will wean dosage as appropriate for resident   Ok Edwards NP [facility]  Contact [phone] Monday through Friday 8am- 5pm  After hours call [phone]

## 2017-08-24 DIAGNOSIS — D649 Anemia, unspecified: Secondary | ICD-10-CM | POA: Diagnosis not present

## 2017-08-24 DIAGNOSIS — Z79899 Other long term (current) drug therapy: Secondary | ICD-10-CM | POA: Diagnosis not present

## 2017-08-24 DIAGNOSIS — E785 Hyperlipidemia, unspecified: Secondary | ICD-10-CM | POA: Diagnosis not present

## 2017-08-24 DIAGNOSIS — E119 Type 2 diabetes mellitus without complications: Secondary | ICD-10-CM | POA: Diagnosis not present

## 2017-08-24 LAB — LIPID PANEL
CHOLESTEROL: 133 (ref 0–200)
HDL: 35 (ref 35–70)
LDL CALC: 60
TRIGLYCERIDES: 191 — AB (ref 40–160)

## 2017-08-24 LAB — BASIC METABOLIC PANEL
BUN: 18 (ref 4–21)
CREATININE: 0.6 (ref 0.5–1.1)
Glucose: 115
Potassium: 5 (ref 3.4–5.3)
Sodium: 138 (ref 137–147)

## 2017-08-24 LAB — CBC AND DIFFERENTIAL
HCT: 39 (ref 36–46)
HEMOGLOBIN: 13.5 (ref 12.0–16.0)
NEUTROS ABS: 4
PLATELETS: 241 (ref 150–399)
WBC: 9.7

## 2017-08-24 LAB — HEPATIC FUNCTION PANEL
ALK PHOS: 68 (ref 25–125)
ALT: 17 (ref 7–35)
AST: 14 (ref 13–35)
Bilirubin, Total: 0.2

## 2017-08-24 LAB — HEMOGLOBIN A1C: Hemoglobin A1C: 6.9

## 2017-08-31 ENCOUNTER — Other Ambulatory Visit: Payer: Self-pay

## 2017-08-31 MED ORDER — OXYCODONE-ACETAMINOPHEN 5-325 MG PO TABS: 1.0000 | ORAL_TABLET | Freq: Four times a day (QID) | ORAL | 0 refills | Status: DC

## 2017-08-31 NOTE — Telephone Encounter (Signed)
Rx faxed to [facility] (P) [phone], (F) [phone] 

## 2017-09-07 ENCOUNTER — Non-Acute Institutional Stay (SKILLED_NURSING_FACILITY): Payer: Medicare Other | Admitting: Adult Health

## 2017-09-07 DIAGNOSIS — F0280 Dementia in other diseases classified elsewhere without behavioral disturbance: Secondary | ICD-10-CM

## 2017-09-07 DIAGNOSIS — Z794 Long term (current) use of insulin: Secondary | ICD-10-CM

## 2017-09-07 DIAGNOSIS — G301 Alzheimer's disease with late onset: Secondary | ICD-10-CM | POA: Diagnosis not present

## 2017-09-07 DIAGNOSIS — M150 Primary generalized (osteo)arthritis: Secondary | ICD-10-CM

## 2017-09-07 DIAGNOSIS — E119 Type 2 diabetes mellitus without complications: Secondary | ICD-10-CM | POA: Diagnosis not present

## 2017-09-07 DIAGNOSIS — M159 Polyosteoarthritis, unspecified: Secondary | ICD-10-CM

## 2017-09-08 ENCOUNTER — Encounter: Payer: Self-pay | Admitting: Adult Health

## 2017-09-08 NOTE — Progress Notes (Signed)
Location:   [facility] Room Number: 115 B Place of Service:  SNF (31)   CODE STATUS: DNR  No Known Allergies  Chief Complaint  Patient presents with  . Acute Visit    Care Plan Meeting    HPI:  We have come together for her routine care plan. She is not voicing any nursing or medical concerns at this time. She denies any uncontrolled pain; no insomnia; no anxiety or depression. There are no nursing concerns. We have reviewed her medications and plan of care.    Past Medical History:  Diagnosis Date  . Acute pyelonephritis   . Alzheimer's disease   . Bladder outlet obstruction   . Depression   . Diarrhea   . Environmental and seasonal allergies   . Essential hypertension, benign   . Foley catheter in place   . GERD (gastroesophageal reflux disease)   . History of urinary tract infection   . Hypertension   . Hypothyroidism   . Insomnia   . Nephrolithiasis   . Osteoarthritis   . Reflux esophagitis   . Rickets, active   . Sepsis ([place])   . Unspecified constipation   . Unspecified psychosis   . Unspecified vitamin D deficiency     Past Surgical History:  Procedure Laterality Date  . ABDOMINAL HYSTERECTOMY    . CYSTOSCOPY W/ URETERAL STENT PLACEMENT Bilateral 05/18/2015   Procedure: CYSTOSCOPY WITH RETROGRADE PYELOGRAM/URETERAL STENT PLACEMENT;  Surgeon: Carolan Clines, MD;  Location: [facility];  Service: Urology;  Laterality: Bilateral;  . CYSTOSCOPY W/ URETERAL STENT REMOVAL Bilateral 09/12/2015   Procedure: CYSTOSCOPY WITH STENT REMOVAL;  Surgeon: Carolan Clines, MD;  Location: [facility];  Service: Urology;  Laterality: Bilateral;  1 HOUR  . CYSTOSCOPY WITH RETROGRADE PYELOGRAM, URETEROSCOPY AND STENT PLACEMENT Bilateral 07/18/2015   Procedure: CYSTOSCOPY REMOVE LEFT DOUBLE J STENT LEFT RETROGRADE PYELOGRAM, LEFT URETEROSCOPY, PYELOSCOPY, REPLACEMENT OF LEFT DOUBLE J STENT;  Surgeon: Carolan Clines, MD;  Location: [facility];  Service: Urology;   Laterality: Bilateral;  . HOLMIUM LASER APPLICATION Bilateral 4/53/6468   Procedure: HOLMIUM LASER OF LEFT LOWER URETERAL STONE, LEFT PYELOSCOPY, REPLACE LEFT DOUBLE J STENT REMOVE RIGHT DOUBLE J STENT RIGHT RETROGRADE PYELOGRAM, REPLACEMENT RIGHT DOUBLE J STENT ;  Surgeon: Carolan Clines, MD;  Location: [facility];  Service: Urology;  Laterality: Bilateral;  . KNEE ARTHROSCOPY Left 1997    Social History   Socioeconomic History  . Marital status: Married    Spouse name: Not on file  . Number of children: Not on file  . Years of education: Not on file  . Highest education level: Not on file  Occupational History  . Not on file  Social Needs  . Financial resource strain: Not hard at all  . Food insecurity:    Worry: Never true    Inability: Never true  . Transportation needs:    Medical: No    Non-medical: No  Tobacco Use  . Smoking status: Never Smoker  . Smokeless tobacco: Never Used  Substance and Sexual Activity  . Alcohol use: No  . Drug use: No  . Sexual activity: Not on file  Lifestyle  . Physical activity:    Days per week: 0 days    Minutes per session: 0 min  . Stress: Not at all  Relationships  . Social connections:    Talks on phone: Once a week    Gets together: Once a week    Attends religious service: Never    Active  member of club or organization: No    Attends meetings of clubs or organizations: Never    Relationship status: Married  . Intimate partner violence:    Fear of current or ex partner: No    Emotionally abused: No    Physically abused: No    Forced sexual activity: No  Other Topics Concern  . Not on file  Social History Narrative  . Not on file   Family History  Family history unknown: Yes      VITAL SIGNS BP (!) 141/76   Pulse 80   Temp 98.2 F (36.8 C)   Resp 18   Ht 4\' 11"  (1.499 m)   Wt 155 lb 3.2 oz (70.4 kg)   SpO2 98%   BMI 31.35 kg/m   Outpatient Encounter Medications as of 09/07/2017  Medication Sig  .  acetaminophen (TYLENOL) 325 MG tablet Take 650 mg by mouth every 6 (six) hours as needed for moderate pain.  Marland Kitchen acetaminophen (TYLENOL) 500 MG tablet Take 1,000 mg by mouth daily. Pain management  . aspirin 325 MG tablet Take 325 mg by mouth daily.  Marland Kitchen atorvastatin (LIPITOR) 10 MG tablet Take 10 mg by mouth daily at 6 PM.  . cholecalciferol (VITAMIN D) 1000 UNITS tablet Take 2,000 Units by mouth daily.  . Insulin Detemir (LEVEMIR FLEXPEN) 100 UNIT/ML Pen Inject 12 Units into the skin every morning. And 12 units subcutaneously in the evening  . linaclotide (LINZESS) 72 MCG capsule Take 72 mcg by mouth daily.  Marland Kitchen lisinopril (PRINIVIL,ZESTRIL) 5 MG tablet Take 5 mg by mouth daily.   . metoprolol tartrate (LOPRESSOR) 25 MG tablet Take 25 mg by mouth 2 (two) times daily. For HTN  Hold if B/P < 100/60 or HR < 60  . Multiple Vitamin (MULTIVITAMIN) tablet Take 1 tablet by mouth daily. Wound healing  . nitrofurantoin, macrocrystal-monohydrate, (MACROBID) 100 MG capsule Take 1 capsule (100 mg total) by mouth at bedtime.  . Nutritional Supplements (NUTRITIONAL SUPPLEMENT PO) Regular Diet - Regular texture, Regular consistency  . nystatin (NYSTATIN) powder Apply to bilateral breast topically two times daily for rash  . Omega-3 Fatty Acids (FISH OIL OMEGA-3 PO) Give 1 capsule by mouth every morning  . oxyCODONE-acetaminophen (PERCOCET/ROXICET) 5-325 MG tablet Take 1 tablet by mouth every 6 (six) hours.  . Polyvinyl Alcohol-Povidone (FRESHKOTE) 2.7-2 % SOLN Apply 1 drop to eye 3 (three) times daily. To both eyes  . triamcinolone (KENALOG) 0.025 % cream Apply to bilateral feet topically two times daily for excessive dry skin   No facility-administered encounter medications on file as of 09/07/2017.      SIGNIFICANT DIAGNOSTIC EXAMS  PREVIOUS   05-18-15: ct of abdomen and pelvis: 1. Motion and hardware degraded images. 2. Moderate left-sided hydroureteronephrosis secondary to a distal left ureteric 11 x 13 mm  calculus. Decreased left-sided renal function. 3. Large burden bilateral renal collecting system stones, including stones within both extrarenal pelves. 4. Cirrhosis with small volume ascites. 5. Proctitis, likely related to C. difficile colitis, given the clinical history. 6. Bladder wall irregularity which could relate to cystitis. Bladder diverticulum, suggesting a component of outlet obstruction. 7. Skin irregularity superficial to the coccyx. Recommend physical exam correlation to exclude decubitus ulcer. No gross osseous destruction.   NO NEW EXAMS     LABS REVIEWED: PREVIOUS   09-14-16: chol 142; ldl 60; trig 228; hdl 36 hgb a1c 6.0  09-28-16: wbc 7.5; hgb 11.5; hct 35.1; mcv 102.4; plt 355 glucose 208; bun 36.1;  creat 0.92; k+ 5.8; na++ 139; ca 9.5; liver normal albumin 3.5 11-16-16: glucose 97; bun 18.2; creat 0.77; k+ 5.0; na++ 136; ca 9.3 02-14-17: glucose 143; bun 25.9; creat 0.91; k+ 5.1; na++ 140; ca 9.6; liver normal albumin 4.0; tsh 1.01; hgb a1c 6.7; chol 127; ldl 64; trig 143; hdl 34  06-17-17: hgb a1c 6.0    LABS REVIEWED TODAY:   08-24-17: wbc 9.7; hgb 13.5; hct 38.4; mcv 97.8 plt 241; glucose 113; bun 17.5; creat 0.63; k+ 5.0; na++ 138; liver normal albumin 3.9; chol 139; ldl 60; trig 191; hdl 35; hgb a1c 6.9      Review of Systems  Constitutional: Negative for malaise/fatigue.  Respiratory: Negative for cough and shortness of breath.   Cardiovascular: Negative for chest pain, palpitations and leg swelling.  Gastrointestinal: Negative for abdominal pain, constipation and heartburn.  Musculoskeletal: Negative for back pain, joint pain and myalgias.  Skin: Negative.   Neurological: Negative for dizziness.  Psychiatric/Behavioral: The patient is not nervous/anxious.     Physical Exam  Constitutional: She is oriented to person, place, and time. She appears well-developed and well-nourished. No distress.  Neck: No thyromegaly present.  Cardiovascular: Normal rate,  regular rhythm and intact distal pulses.  Murmur heard. 1/6  Pulmonary/Chest: Effort normal and breath sounds normal. No respiratory distress.  Abdominal: Soft. Bowel sounds are normal. She exhibits no distension. There is no tenderness.  Musculoskeletal: She exhibits no edema.  Is able to move all extremities Spends all of time in bed per her choice   Lymphadenopathy:    She has no cervical adenopathy.  Neurological: She is alert and oriented to person, place, and time.  Skin: Skin is warm and dry. She is not diaphoretic.  Psychiatric: She has a normal mood and affect.   .  ASSESSMENT/ PLAN:  TODAY:   1. Diabetes mellitus type II insulin dependent:  3.  Alzheimer's disease, late onset without behavioral disturbance:  5. Osteoarthritis, primary multiple joints:  Will continue her plan of care; will not make medication changes at this time  Will continue to monitor her status.   MD is aware of resident's narcotic use and is in agreement with current plan of care. We will attempt to wean resident as apropriate   Ok Edwards NP [facility]  Contact [phone] Monday through Friday 8am- 5pm  After hours call [phone]

## 2017-09-12 ENCOUNTER — Encounter: Payer: Self-pay | Admitting: Adult Health

## 2017-09-12 ENCOUNTER — Non-Acute Institutional Stay (SKILLED_NURSING_FACILITY): Payer: Medicare Other | Admitting: Internal Medicine

## 2017-09-12 ENCOUNTER — Encounter: Payer: Self-pay | Admitting: Internal Medicine

## 2017-09-12 DIAGNOSIS — H109 Unspecified conjunctivitis: Secondary | ICD-10-CM | POA: Diagnosis not present

## 2017-09-12 NOTE — Progress Notes (Signed)
Patient ID: Ana Newman, female   DOB: 1935-03-06, 82 y.o.   MRN: [id number]  Location:  [facility] Room Number: 115 B Place of Service:  SNF [id number]) Provider:  [place], [place], DO  Patient Care Team: Gildardo Cranker, DO as PCP - General (Internal Medicine) Nyoka Cowden Phylis Bougie, NP as Nurse Practitioner ([place]) [facility], [place] [facility] [place] ([place])  Extended Emergency Contact Information Primary Emergency Contact: Smith,Christie Address: [address] [address] Johnnette Litter of [place] Phone: [phone] Relation: Spouse Secondary Emergency Contact: Allen Norris [place] Phone: [phone] Mobile Phone: [phone] Relation: Son  Code Status:  DNR Goals of care: Advanced Directive information Advanced Directives 09/12/2017  Does Patient Have a Medical Advance Directive? Yes  Type of Advance Directive Out of facility DNR (pink MOST or yellow form)  Does patient want to make changes to medical advance directive? No - Patient declined  Copy of [place] in Chart? -  Would patient like information on creating a medical advance directive? -  Pre-existing out of facility DNR order (yellow form or pink MOST form) Pink MOST form placed in chart (order not valid for inpatient use);Yellow form placed in chart (order not valid for inpatient use)     Chief Complaint  Patient presents with  . Acute Visit    Left eye redness with drainage    HPI:  Pt is a 82 y.o. female seen today for an acute visit for OS d/c. Pt reports 2 day hx red OS with green d/c. initally eye was painful but that has improved, she notes purulent d/c that matts her eye. She has blurry vision but no loss of vision. No f/c. No known trauma. She is a poor historian due to dementia. Hx obtained from chart. She has a hx seasonal allergy   Past Medical History:  Diagnosis Date  . Acute  pyelonephritis   . Alzheimer's disease   . Bladder outlet obstruction   . Depression   . Diarrhea   . Environmental and seasonal allergies   . Essential hypertension, benign   . Foley catheter in place   . GERD (gastroesophageal reflux disease)   . History of urinary tract infection   . Hypertension   . Hypothyroidism   . Insomnia   . Nephrolithiasis   . Osteoarthritis   . Reflux esophagitis   . Rickets, active   . Sepsis ([place])   . Unspecified constipation   . Unspecified psychosis   . Unspecified vitamin D deficiency    Past Surgical History:  Procedure Laterality Date  . ABDOMINAL HYSTERECTOMY    . CYSTOSCOPY W/ URETERAL STENT PLACEMENT Bilateral 05/18/2015   Procedure: CYSTOSCOPY WITH RETROGRADE PYELOGRAM/URETERAL STENT PLACEMENT;  Surgeon: Carolan Clines, MD;  Location: [facility];  Service: Urology;  Laterality: Bilateral;  . CYSTOSCOPY W/ URETERAL STENT REMOVAL Bilateral 09/12/2015   Procedure: CYSTOSCOPY WITH STENT REMOVAL;  Surgeon: Carolan Clines, MD;  Location: [facility];  Service: Urology;  Laterality: Bilateral;  1 HOUR  . CYSTOSCOPY WITH RETROGRADE PYELOGRAM, URETEROSCOPY AND STENT PLACEMENT Bilateral 07/18/2015   Procedure: CYSTOSCOPY REMOVE LEFT DOUBLE J STENT LEFT RETROGRADE PYELOGRAM, LEFT URETEROSCOPY, PYELOSCOPY, REPLACEMENT OF LEFT DOUBLE J STENT;  Surgeon: Carolan Clines, MD;  Location: [facility];  Service: Urology;  Laterality: Bilateral;  . HOLMIUM LASER APPLICATION Bilateral 07/24/6331   Procedure: HOLMIUM LASER OF LEFT LOWER URETERAL STONE, LEFT PYELOSCOPY, REPLACE  LEFT DOUBLE J STENT REMOVE RIGHT DOUBLE J STENT RIGHT RETROGRADE PYELOGRAM, REPLACEMENT RIGHT DOUBLE J STENT ;  Surgeon: Carolan Clines, MD;  Location: [facility];  Service: Urology;  Laterality: Bilateral;  . KNEE ARTHROSCOPY Left 1997    No Known Allergies  Outpatient Encounter Medications as of 09/12/2017  Medication Sig  . acetaminophen (TYLENOL) 325 MG tablet Take 650 mg by mouth every 6  (six) hours as needed for moderate pain.  Marland Kitchen acetaminophen (TYLENOL) 500 MG tablet Take 1,000 mg by mouth daily. Pain management  . aspirin 325 MG tablet Take 325 mg by mouth daily.  Marland Kitchen atorvastatin (LIPITOR) 10 MG tablet Take 10 mg by mouth daily at 6 PM.  . cholecalciferol (VITAMIN D) 1000 UNITS tablet Take 2,000 Units by mouth daily.  . Insulin Detemir (LEVEMIR FLEXPEN) 100 UNIT/ML Pen Inject 12 Units into the skin every morning. And 12 units subcutaneously in the evening  . linaclotide (LINZESS) 72 MCG capsule Take 72 mcg by mouth daily.  Marland Kitchen lisinopril (PRINIVIL,ZESTRIL) 5 MG tablet Take 5 mg by mouth daily.   . metoprolol tartrate (LOPRESSOR) 25 MG tablet Take 25 mg by mouth 2 (two) times daily. For HTN  Hold if B/P < 100/60 or HR < 60  . Multiple Vitamin (MULTIVITAMIN) tablet Take 1 tablet by mouth daily. Wound healing  . nitrofurantoin, macrocrystal-monohydrate, (MACROBID) 100 MG capsule Take 1 capsule (100 mg total) by mouth at bedtime.  . Nutritional Supplements (NUTRITIONAL SUPPLEMENT PO) Regular Diet - Regular texture, Regular consistency  . nystatin (NYSTATIN) powder Apply to bilateral breast topically two times daily for rash  . Omega-3 Fatty Acids (FISH OIL OMEGA-3 PO) Give 1 capsule by mouth every morning  . oxyCODONE-acetaminophen (PERCOCET/ROXICET) 5-325 MG tablet Take 1 tablet by mouth every 6 (six) hours.  . Polyvinyl Alcohol-Povidone (FRESHKOTE) 2.7-2 % SOLN Apply 1 drop to eye 3 (three) times daily. To both eyes  . triamcinolone (KENALOG) 0.025 % cream Apply to bilateral feet topically two times daily for excessive dry skin   No facility-administered encounter medications on file as of 09/12/2017.     Review of Systems  Unable to perform ROS: Dementia    Immunization History  Administered Date(s) Administered  . Hepatitis B 11/12/2015  . Influenza Whole 11/09/2012  . Influenza-Unspecified 11/12/2013, 11/11/2014, 11/12/2015, 12/02/2016  . PPD Test 10/10/2015, 10/17/2015    . Pneumococcal-Unspecified 01/03/2008   Pertinent  Health Maintenance Due  Topic Date Due  . INFLUENZA VACCINE  12/09/2017 (Originally 09/01/2017)  . HEMOGLOBIN A1C  02/24/2018  . FOOT EXAM  06/30/2018  . DEXA SCAN  Completed  . PNA vac Low Risk Adult  Completed  . OPHTHALMOLOGY EXAM  Discontinued   Fall Risk  08/11/2017 07/29/2016  Falls in the past year? No No   Functional Status Survey:    Vitals:   09/12/17 1122  BP: 129/63  Pulse: 81  Resp: 20  Temp: (!) 97.3 F (36.3 C)  SpO2: 96%  Weight: 155 lb 3.2 oz (70.4 kg)  Height: 4\' 11"  (1.499 m)   Body mass index is 31.35 kg/m. Physical Exam  Constitutional: She appears well-developed and well-nourished.  Lying in bed in NAD  Eyes: Pupils are equal, round, and reactive to light. Right eye exhibits no discharge. Left eye exhibits discharge.  Left corneal redness with conjunctival injection, green d/c, orbital TTP with palpation and upper >lower lid swelling with min redness. EOMI. No redness or d/cin OD  Skin: Skin is warm and dry.  Psychiatric: She  has a normal mood and affect. Her behavior is normal. Thought content normal.    Labs reviewed: Recent Labs    11/16/16 02/14/17 08/24/17  NA 136* 140 138  K 5.0 5.1 5.0  BUN 18 24* 18  CREATININE 0.8 0.9 0.6   Recent Labs    09/28/16 02/14/17 08/24/17  AST 13 11* 14  ALT 9 11 17   ALKPHOS 71 76 68   Recent Labs    09/28/16 08/24/17  WBC 7.5 9.7  NEUTROABS 4 4  HGB 11.5* 13.5  HCT 35* 39  PLT 355 241   Lab Results  Component Value Date   TSH 1.01 02/14/2017   Lab Results  Component Value Date   HGBA1C 6.9 08/24/2017   Lab Results  Component Value Date   CHOL 133 08/24/2017   HDL 35 08/24/2017   LDLCALC 60 08/24/2017   TRIG 191 (A) 08/24/2017    Significant Diagnostic Results in last 30 days:  No results found.  Assessment/Plan   ICD-10-CM   1. Bacterial conjunctivitis of left eye H10.9     START CIPRO OPHTH GTTS - INSTILL 2 GTTS IN OU QID X 5  DAYS - if no response in 1st 24 hrs, may need po vs IV abx  APPLY WARM COMPRESS TO OS QSHIFT X 48 HRS  Hand washing emphasized to prevent cross contamination  Cont other meds as ordered  Will follow  Labs/tests ordered: none   Mete Purdum S. Perlie Gold  [facility] [address] [place], [place] [address] [phone] Cell (Monday-Friday 8 AM - 5 PM) [phone] After 5 PM and follow prompts

## 2017-09-13 ENCOUNTER — Encounter: Payer: Self-pay | Admitting: Internal Medicine

## 2017-09-15 ENCOUNTER — Encounter: Payer: Self-pay | Admitting: Adult Health

## 2017-09-15 ENCOUNTER — Non-Acute Institutional Stay (SKILLED_NURSING_FACILITY): Payer: Medicare Other | Admitting: Adult Health

## 2017-09-15 DIAGNOSIS — G301 Alzheimer's disease with late onset: Secondary | ICD-10-CM

## 2017-09-15 DIAGNOSIS — F0280 Dementia in other diseases classified elsewhere without behavioral disturbance: Secondary | ICD-10-CM

## 2017-09-15 DIAGNOSIS — E119 Type 2 diabetes mellitus without complications: Secondary | ICD-10-CM

## 2017-09-15 DIAGNOSIS — E1169 Type 2 diabetes mellitus with other specified complication: Secondary | ICD-10-CM

## 2017-09-15 DIAGNOSIS — Z794 Long term (current) use of insulin: Secondary | ICD-10-CM

## 2017-09-15 DIAGNOSIS — K5909 Other constipation: Secondary | ICD-10-CM | POA: Diagnosis not present

## 2017-09-15 DIAGNOSIS — E785 Hyperlipidemia, unspecified: Secondary | ICD-10-CM

## 2017-09-15 NOTE — Progress Notes (Signed)
Location:   [facility] Room Number: 115 B Place of Service:  SNF (31)   CODE STATUS: DNR  No Known Allergies  Chief Complaint  Patient presents with  . Medical Management of Chronic Issues    Diabetes; dyslipidemia; alzheimer disease; constipation.     HPI:  She is a 82 year old long term resident of this facility being seen for the management of her chronic illnesses; diabetes; dyslipidemia; alzheimer disease; constipation. She denies any changes in appetite or thirst; no uncontrolled pain; no constipation. She is presently on eye drops to her left eye for conjunctivitis. She tells ne that her eye is feeling better. There are no nursing concerns at this time.   Past Medical History:  Diagnosis Date  . Acute pyelonephritis   . Alzheimer's disease   . Bladder outlet obstruction   . Depression   . Diarrhea   . Environmental and seasonal allergies   . Essential hypertension, benign   . Foley catheter in place   . GERD (gastroesophageal reflux disease)   . History of urinary tract infection   . Hypertension   . Hypothyroidism   . Insomnia   . Nephrolithiasis   . Osteoarthritis   . Reflux esophagitis   . Rickets, active   . Sepsis ([place])   . Unspecified constipation   . Unspecified psychosis   . Unspecified vitamin D deficiency     Past Surgical History:  Procedure Laterality Date  . ABDOMINAL HYSTERECTOMY    . CYSTOSCOPY W/ URETERAL STENT PLACEMENT Bilateral 05/18/2015   Procedure: CYSTOSCOPY WITH RETROGRADE PYELOGRAM/URETERAL STENT PLACEMENT;  Surgeon: Carolan Clines, MD;  [facility];  Service: Urology;  Laterality: Bilateral;  . CYSTOSCOPY W/ URETERAL STENT REMOVAL Bilateral 09/12/2015   Procedure: CYSTOSCOPY WITH STENT REMOVAL;  Surgeon: Carolan Clines, MD;  [facility];  Service: Urology;  Laterality: Bilateral;  1 HOUR  . CYSTOSCOPY WITH RETROGRADE PYELOGRAM, URETEROSCOPY AND STENT PLACEMENT Bilateral 07/18/2015   Procedure:  CYSTOSCOPY REMOVE LEFT DOUBLE J STENT LEFT RETROGRADE PYELOGRAM, LEFT URETEROSCOPY, PYELOSCOPY, REPLACEMENT OF LEFT DOUBLE J STENT;  Surgeon: Carolan Clines, MD;  [facility];  Service: Urology;  Laterality: Bilateral;  . HOLMIUM LASER APPLICATION Bilateral 4/70/9628   Procedure: HOLMIUM LASER OF LEFT LOWER URETERAL STONE, LEFT PYELOSCOPY, REPLACE LEFT DOUBLE J STENT REMOVE RIGHT DOUBLE J STENT RIGHT RETROGRADE PYELOGRAM, REPLACEMENT RIGHT DOUBLE J STENT ;  Surgeon: Carolan Clines, MD;  Location: [facility];  Service: Urology;  Laterality: Bilateral;  . KNEE ARTHROSCOPY Left 1997    Social History   Socioeconomic History  . Marital status: Married    Spouse name: Not on file  . Number of children: Not on file  . Years of education: Not on file  . Highest education level: Not on file  Occupational History  . Not on file  Social Needs  . Financial resource strain: Not hard at all  . Food insecurity:    Worry: Never true    Inability: Never true  . Transportation needs:    Medical: No    Non-medical: No  Tobacco Use  . Smoking status: Never Smoker  . Smokeless tobacco: Never Used  Substance and Sexual Activity  . Alcohol use: No  . Drug use: No  . Sexual activity: Not on file  Lifestyle  . Physical activity:    Days per week: 0 days    Minutes per session: 0 min  . Stress: Not at all  Relationships  . Social connections:  Talks on phone: Once a week    Gets together: Once a week    Attends religious service: Never    Active member of club or organization: No    Attends meetings of clubs or organizations: Never    Relationship status: Married  . Intimate partner violence:    Fear of current or ex partner: No    Emotionally abused: No    Physically abused: No    Forced sexual activity: No  Other Topics Concern  . Not on file  Social History Narrative  . Not on file   Family History  Family history unknown: Yes      VITAL SIGNS BP 129/63   Pulse 70    Temp 97.9 F (36.6 C)   Resp 18   Ht 4\' 11"  (1.499 m)   Wt 155 lb 3.2 oz (70.4 kg)   SpO2 98%   BMI 31.35 kg/m   Outpatient Encounter Medications as of 09/15/2017  Medication Sig  . acetaminophen (TYLENOL) 325 MG tablet Take 650 mg by mouth every 6 (six) hours as needed for moderate pain.  Marland Kitchen acetaminophen (TYLENOL) 500 MG tablet Take 1,000 mg by mouth daily. Pain management  . aspirin 325 MG tablet Take 325 mg by mouth daily.  Marland Kitchen atorvastatin (LIPITOR) 10 MG tablet Take 10 mg by mouth daily at 6 PM.  . cholecalciferol (VITAMIN D) 1000 UNITS tablet Take 2,000 Units by mouth daily.  . ciprofloxacin (CILOXAN) 0.3 % ophthalmic solution Place 2 drops into both eyes every 6 (six) hours. X 5 days  . Insulin Detemir (LEVEMIR FLEXPEN) 100 UNIT/ML Pen Inject 12 Units into the skin every morning. And 12 units subcutaneously in the evening  . linaclotide (LINZESS) 72 MCG capsule Take 72 mcg by mouth daily.  Marland Kitchen lisinopril (PRINIVIL,ZESTRIL) 5 MG tablet Take 5 mg by mouth daily.   . metoprolol tartrate (LOPRESSOR) 25 MG tablet Take 25 mg by mouth 2 (two) times daily. For HTN  Hold if B/P < 100/60 or HR < 60  . Multiple Vitamin (MULTIVITAMIN) tablet Take 1 tablet by mouth daily. Wound healing  . nitrofurantoin, macrocrystal-monohydrate, (MACROBID) 100 MG capsule Take 1 capsule (100 mg total) by mouth at bedtime.  . Nutritional Supplements (NUTRITIONAL SUPPLEMENT PO) Regular Diet - Regular texture, Regular consistency  . nystatin (NYSTATIN) powder Apply to bilateral breast topically two times daily for rash  . Omega-3 Fatty Acids (FISH OIL OMEGA-3 PO) Give 1 capsule by mouth every morning  . oxyCODONE-acetaminophen (PERCOCET/ROXICET) 5-325 MG tablet Take 1 tablet by mouth every 6 (six) hours.  . Polyvinyl Alcohol-Povidone (FRESHKOTE) 2.7-2 % SOLN Apply 1 drop to eye 3 (three) times daily. To both eyes  . triamcinolone (KENALOG) 0.025 % cream Apply to bilateral feet topically two times daily for excessive  dry skin   No facility-administered encounter medications on file as of 09/15/2017.      SIGNIFICANT DIAGNOSTIC EXAMS   PREVIOUS   05-18-15: ct of abdomen and pelvis: 1. Motion and hardware degraded images. 2. Moderate left-sided hydroureteronephrosis secondary to a distal left ureteric 11 x 13 mm calculus. Decreased left-sided renal function. 3. Large burden bilateral renal collecting system stones, including stones within both extrarenal pelves. 4. Cirrhosis with small volume ascites. 5. Proctitis, likely related to C. difficile colitis, given the clinical history. 6. Bladder wall irregularity which could relate to cystitis. Bladder diverticulum, suggesting a component of outlet obstruction. 7. Skin irregularity superficial to the coccyx. Recommend physical exam correlation to exclude decubitus ulcer.  No gross osseous destruction.   NO NEW EXAMS     LABS REVIEWED: PREVIOUS   09-14-16: chol 142; ldl 60; trig 228; hdl 36 hgb a1c 6.0  09-28-16: wbc 7.5; hgb 11.5; hct 35.1; mcv 102.4; plt 355 glucose 208; bun 36.1; creat 0.92; k+ 5.8; na++ 139; ca 9.5; liver normal albumin 3.5 11-16-16: glucose 97; bun 18.2; creat 0.77; k+ 5.0; na++ 136; ca 9.3 02-14-17: glucose 143; bun 25.9; creat 0.91; k+ 5.1; na++ 140; ca 9.6; liver normal albumin 4.0; tsh 1.01; hgb a1c 6.7; chol 127; ldl 64; trig 143; hdl 34  06-17-17: hgb a1c 6.0   08-24-17: wbc 9.7; hgb 13.5; hct 38.4; mcv 97.8 plt 241; glucose 113; bun 17.5; creat 0.63; k+ 5.0; na++ 138; liver normal albumin 3.9; chol 139; ldl 60; trig 191; hdl 35; hgb a1c 6.9    NO NEW LABS.       Review of Systems  Constitutional: Negative for malaise/fatigue.  Eyes:       Left eye is better   Respiratory: Negative for cough and shortness of breath.   Cardiovascular: Negative for chest pain, palpitations and leg swelling.  Gastrointestinal: Negative for abdominal pain, constipation and heartburn.  Musculoskeletal: Negative for back pain, joint pain and  myalgias.  Skin: Negative.   Neurological: Negative for dizziness.  Psychiatric/Behavioral: The patient is not nervous/anxious.     Physical Exam  Constitutional: She is oriented to person, place, and time. She appears well-developed and well-nourished. No distress.  Eyes: Left eye exhibits no discharge.  Neck: No thyromegaly present.  Cardiovascular: Normal rate, regular rhythm and intact distal pulses.  Murmur heard. 1/6  Pulmonary/Chest: Effort normal and breath sounds normal. No respiratory distress.  Abdominal: Soft. Bowel sounds are normal. She exhibits no distension. There is no tenderness.  Musculoskeletal: She exhibits no edema.  Able to move all extremities Spends all of time in bed per her choice  Lymphadenopathy:    She has no cervical adenopathy.  Neurological: She is alert and oriented to person, place, and time.  Skin: Skin is warm and dry. She is not diaphoretic.  Psychiatric: She has a normal mood and affect.     ASSESSMENT/ PLAN:  TODAY:   1. Diabetes mellitus type II insulin dependent:  hgb a1c is 6.9 (prevoius 6.0)  She is presently stable her cbgs are good ; will continue  lantus  12 units every 12 hours  She is on a statin and ace and asa   2. Dyslipidemia associated with type 2 diabetes mellitus: ldl is 60 ; is stable will continue lipitor 10 mg daily and fish oil 1 gm daily    3.  Alzheimer's disease, late onset without behavioral disturbance: no change in status; is presently not taking medications will not make changes will monitor her status.  Her current weight is 155 pounds; which is presently stable will monitor   4.  Chronic constipation:stable will continue linzess 72 mcg daily  PREVIOUS   5. Osteoarthritis, primary multiple joints: is stable  pain is presently being managed; will continue tylenol 1 gm daily and percocet 5/325 mg every 6 hours routinely   6. Major depression with psychotic features: is presently stable is presently not taking  medications; will monitor   7. Nephrolithiasis; outlet obstruction: is on long term macrobid daily  is followed by urology.   8. Weight loss, non intentional : her current weight is 155 pounds; her weight in April 2018: 162 pounds. Will continue supplements per facility  protocol.   9.  Hypertension associated with diabetes :b/p 129/63 is stable; will continue lisinopril 5 mg daily; and lopressor 25 mg twice daily  Asa 325 mg daily   MD is aware of resident's narcotic use and is in agreement with current plan of care. We will attempt to wean resident as apropriate   Ok Edwards NP [facility]  Contact [phone] Monday through Friday 8am- 5pm  After hours call [phone]

## 2017-09-18 DIAGNOSIS — F0280 Dementia in other diseases classified elsewhere without behavioral disturbance: Secondary | ICD-10-CM | POA: Insufficient documentation

## 2017-09-18 DIAGNOSIS — G301 Alzheimer's disease with late onset: Secondary | ICD-10-CM

## 2017-09-21 ENCOUNTER — Encounter: Payer: Self-pay | Admitting: Internal Medicine

## 2017-09-29 DIAGNOSIS — B351 Tinea unguium: Secondary | ICD-10-CM | POA: Diagnosis not present

## 2017-09-29 DIAGNOSIS — E1051 Type 1 diabetes mellitus with diabetic peripheral angiopathy without gangrene: Secondary | ICD-10-CM | POA: Diagnosis not present

## 2017-09-30 ENCOUNTER — Other Ambulatory Visit: Payer: Self-pay

## 2017-09-30 MED ORDER — OXYCODONE-ACETAMINOPHEN 5-325 MG PO TABS: 1.0000 | ORAL_TABLET | Freq: Four times a day (QID) | ORAL | 0 refills | Status: DC

## 2017-09-30 NOTE — Telephone Encounter (Signed)
Rx faxed to [facility] (P) [phone], (F) [phone] 

## 2017-09-30 NOTE — Telephone Encounter (Signed)
Entered in error

## 2017-10-03 DIAGNOSIS — F332 Major depressive disorder, recurrent severe without psychotic features: Secondary | ICD-10-CM | POA: Diagnosis not present

## 2017-10-10 DIAGNOSIS — F322 Major depressive disorder, single episode, severe without psychotic features: Secondary | ICD-10-CM | POA: Diagnosis not present

## 2017-10-12 ENCOUNTER — Non-Acute Institutional Stay (SKILLED_NURSING_FACILITY): Payer: Medicare Other | Admitting: Adult Health

## 2017-10-12 ENCOUNTER — Encounter: Payer: Self-pay | Admitting: Adult Health

## 2017-10-12 DIAGNOSIS — R634 Abnormal weight loss: Secondary | ICD-10-CM

## 2017-10-12 DIAGNOSIS — M150 Primary generalized (osteo)arthritis: Secondary | ICD-10-CM

## 2017-10-12 DIAGNOSIS — F323 Major depressive disorder, single episode, severe with psychotic features: Secondary | ICD-10-CM

## 2017-10-12 DIAGNOSIS — M159 Polyosteoarthritis, unspecified: Secondary | ICD-10-CM

## 2017-10-12 DIAGNOSIS — N200 Calculus of kidney: Secondary | ICD-10-CM | POA: Diagnosis not present

## 2017-10-12 NOTE — Progress Notes (Signed)
Location:   [facility] Room Number: 115 B Place of Service:  SNF (31)   CODE STATUS: DNR (Most Form created 05/04/17)  No Known Allergies  Chief Complaint  Patient presents with  . Medical Management of Chronic Issues    Osteoarthritis; nephrolithiasis, depression; weight loss      HPI:  She is a 82 year old long term resident of this facility being seen for the management of her chronic illnesses: osteoarthritis; nephrolithiasis; major depression. She denies any back pain; no changes in appetite; no insomnia. There are no nursing concerns at this time.   Past Medical History:  Diagnosis Date  . Acute pyelonephritis   . Alzheimer's disease   . Bladder outlet obstruction   . Depression   . Diarrhea   . Environmental and seasonal allergies   . Essential hypertension, benign   . Foley catheter in place   . GERD (gastroesophageal reflux disease)   . History of urinary tract infection   . Hypertension   . Hypothyroidism   . Insomnia   . Nephrolithiasis   . Osteoarthritis   . Reflux esophagitis   . Rickets, active   . Sepsis ([place])   . Unspecified constipation   . Unspecified psychosis   . Unspecified vitamin D deficiency     Past Surgical History:  Procedure Laterality Date  . ABDOMINAL HYSTERECTOMY    . CYSTOSCOPY W/ URETERAL STENT PLACEMENT Bilateral 05/18/2015   Procedure: CYSTOSCOPY WITH RETROGRADE PYELOGRAM/URETERAL STENT PLACEMENT;  Surgeon: Carolan Clines, MD;  Location: [facility];  Service: Urology;  Laterality: Bilateral;  . CYSTOSCOPY W/ URETERAL STENT REMOVAL Bilateral 09/12/2015   Procedure: CYSTOSCOPY WITH STENT REMOVAL;  Surgeon: Carolan Clines, MD;  Location: [facility];  Service: Urology;  Laterality: Bilateral;  1 HOUR  . CYSTOSCOPY WITH RETROGRADE PYELOGRAM, URETEROSCOPY AND STENT PLACEMENT Bilateral 07/18/2015   Procedure: CYSTOSCOPY REMOVE LEFT DOUBLE J STENT LEFT RETROGRADE PYELOGRAM, LEFT URETEROSCOPY, PYELOSCOPY, REPLACEMENT OF LEFT  DOUBLE J STENT;  Surgeon: Carolan Clines, MD;  Location: [facility];  Service: Urology;  Laterality: Bilateral;  . HOLMIUM LASER APPLICATION Bilateral 1/63/8466   Procedure: HOLMIUM LASER OF LEFT LOWER URETERAL STONE, LEFT PYELOSCOPY, REPLACE LEFT DOUBLE J STENT REMOVE RIGHT DOUBLE J STENT RIGHT RETROGRADE PYELOGRAM, REPLACEMENT RIGHT DOUBLE J STENT ;  Surgeon: Carolan Clines, MD;  Location: [facility];  Service: Urology;  Laterality: Bilateral;  . KNEE ARTHROSCOPY Left 1997    Social History   Socioeconomic History  . Marital status: Married    Spouse name: Not on file  . Number of children: Not on file  . Years of education: Not on file  . Highest education level: Not on file  Occupational History  . Not on file  Social Needs  . Financial resource strain: Not hard at all  . Food insecurity:    Worry: Never true    Inability: Never true  . Transportation needs:    Medical: No    Non-medical: No  Tobacco Use  . Smoking status: Never Smoker  . Smokeless tobacco: Never Used  Substance and Sexual Activity  . Alcohol use: No  . Drug use: No  . Sexual activity: Not on file  Lifestyle  . Physical activity:    Days per week: 0 days    Minutes per session: 0 min  . Stress: Not at all  Relationships  . Social connections:    Talks on phone: Once a week    Gets together: Once a week  Attends religious service: Never    Active member of club or organization: No    Attends meetings of clubs or organizations: Never    Relationship status: Married  . Intimate partner violence:    Fear of current or ex partner: No    Emotionally abused: No    Physically abused: No    Forced sexual activity: No  Other Topics Concern  . Not on file  Social History Narrative  . Not on file   Family History  Family history unknown: Yes      VITAL SIGNS BP (!) 174/67   Pulse 74   Temp (!) 97 F (36.1 C)   Resp 20   Ht 4\' 11"  (1.499 m)   Wt 155 lb 3.2 oz (70.4 kg)   SpO2 97%   BMI  31.35 kg/m   Outpatient Encounter Medications as of 10/12/2017  Medication Sig  . acetaminophen (TYLENOL) 325 MG tablet Take 650 mg by mouth every 6 (six) hours as needed for moderate pain.  Marland Kitchen acetaminophen (TYLENOL) 500 MG tablet Take 1,000 mg by mouth daily. Pain management  . aspirin 325 MG tablet Take 325 mg by mouth daily.  Marland Kitchen atorvastatin (LIPITOR) 10 MG tablet Take 10 mg by mouth daily at 6 PM.  . cholecalciferol (VITAMIN D) 1000 UNITS tablet Take 2,000 Units by mouth daily.  . Insulin Detemir (LEVEMIR FLEXPEN) 100 UNIT/ML Pen Inject 12 Units into the skin every morning. And 12 units subcutaneously in the evening  . linaclotide (LINZESS) 72 MCG capsule Take 72 mcg by mouth daily.  Marland Kitchen lisinopril (PRINIVIL,ZESTRIL) 5 MG tablet Take 5 mg by mouth daily.   . metoprolol tartrate (LOPRESSOR) 25 MG tablet Take 25 mg by mouth 2 (two) times daily. For HTN  Hold if B/P < 100/60 or HR < 60  . Multiple Vitamin (MULTIVITAMIN) tablet Take 1 tablet by mouth daily. Wound healing  . nitrofurantoin, macrocrystal-monohydrate, (MACROBID) 100 MG capsule Take 1 capsule (100 mg total) by mouth at bedtime.  . Nutritional Supplements (NUTRITIONAL SUPPLEMENT PO) Regular Diet - Regular texture, Regular consistency  . nystatin (NYSTATIN) powder Apply to bilateral breast topically two times daily for rash  . Omega-3 Fatty Acids (FISH OIL OMEGA-3 PO) Give 1 capsule by mouth every morning  . oxyCODONE-acetaminophen (PERCOCET/ROXICET) 5-325 MG tablet Take 1 tablet by mouth every 6 (six) hours.  . Polyvinyl Alcohol-Povidone (FRESHKOTE) 2.7-2 % SOLN Apply 1 drop to eye 3 (three) times daily. To both eyes  . triamcinolone (KENALOG) 0.025 % cream Apply to bilateral feet topically two times daily for excessive dry skin   No facility-administered encounter medications on file as of 10/12/2017.      SIGNIFICANT DIAGNOSTIC EXAMS  PREVIOUS   05-18-15: ct of abdomen and pelvis: 1. Motion and hardware degraded images. 2.  Moderate left-sided hydroureteronephrosis secondary to a distal left ureteric 11 x 13 mm calculus. Decreased left-sided renal function. 3. Large burden bilateral renal collecting system stones, including stones within both extrarenal pelves. 4. Cirrhosis with small volume ascites. 5. Proctitis, likely related to C. difficile colitis, given the clinical history. 6. Bladder wall irregularity which could relate to cystitis. Bladder diverticulum, suggesting a component of outlet obstruction. 7. Skin irregularity superficial to the coccyx. Recommend physical exam correlation to exclude decubitus ulcer. No gross osseous destruction.   NO NEW EXAMS     LABS REVIEWED: PREVIOUS   11-16-16: glucose 97; bun 18.2; creat 0.77; k+ 5.0; na++ 136; ca 9.3 02-14-17: glucose 143; bun 25.9; creat  0.91; k+ 5.1; na++ 140; ca 9.6; liver normal albumin 4.0; tsh 1.01; hgb a1c 6.7; chol 127; ldl 64; trig 143; hdl 34  06-17-17: hgb a1c 6.0   08-24-17: wbc 9.7; hgb 13.5; hct 38.4; mcv 97.8 plt 241; glucose 113; bun 17.5; creat 0.63; k+ 5.0; na++ 138; liver normal albumin 3.9; chol 139; ldl 60; trig 191; hdl 35; hgb a1c 6.9    NO NEW LABS.       Review of Systems  Constitutional: Negative for malaise/fatigue.  Respiratory: Negative for cough and shortness of breath.   Cardiovascular: Negative for chest pain, palpitations and leg swelling.  Gastrointestinal: Negative for abdominal pain, constipation and heartburn.  Musculoskeletal: Negative for back pain, joint pain and myalgias.  Skin: Negative.   Neurological: Negative for dizziness.  Psychiatric/Behavioral: The patient is not nervous/anxious.     Physical Exam  Constitutional: She appears well-developed and well-nourished. No distress.  Neck: No thyromegaly present.  Cardiovascular: Normal rate, regular rhythm and intact distal pulses.  Murmur heard. 1/6  Pulmonary/Chest: Effort normal and breath sounds normal. No respiratory distress.  Abdominal: Soft.  Bowel sounds are normal. She exhibits no distension. There is no tenderness.  Musculoskeletal: She exhibits no edema.  Able to move all extremities Spends all of time in bed per her choice   Lymphadenopathy:    She has no cervical adenopathy.  Neurological: She is alert.  Skin: Skin is warm and dry. She is not diaphoretic.  Psychiatric: She has a normal mood and affect.      ASSESSMENT/ PLAN:  TODAY:   1. Osteoarthritis, primary multiple joints: is stable  pain is presently being managed; will continue tylenol 1 gm daily and percocet 5/325 mg every 6 hours routinely   2. Major depression with psychotic features: is presently stable is presently not taking medications; will monitor   3. Nephrolithiasis; outlet obstruction: is on long term macrobid daily  is followed by urology.   4. Weight loss, non intentional : her current weight is 155 pounds; her weight in April 2018: 162 pounds. Will continue supplements per facility protocol.    PREVIOUS   5.  Hypertension associated with diabetes :b/p 129/63 is stable; will continue lisinopril 5 mg daily; and lopressor 25 mg twice daily  Asa 325 mg daily  6. Diabetes mellitus type II insulin dependent:  hgb a1c is 6.9 (prevoius 6.0)  She is presently stable her cbgs are good ; will continue  lantus  12 units every 12 hours  She is on a statin and ace and asa   7. Dyslipidemia associated with type 2 diabetes mellitus: ldl is 60 ; is stable will continue lipitor 10 mg daily and fish oil 1 gm daily    8.  Alzheimer's disease, late onset without behavioral disturbance: no change in status; is presently not taking medications will not make changes will monitor her status.  Her current weight is 155 pounds; which is presently stable will monitor   9.  Chronic constipation:stable will continue linzess 72 mcg daily    MD is aware of resident's narcotic use and is in agreement with current plan of care. We will attempt to wean resident as  apropriate   Ok Edwards NP [facility]  Contact [phone] Monday through Friday 8am- 5pm  After hours call [phone]

## 2017-10-13 ENCOUNTER — Encounter: Payer: Self-pay | Admitting: Adult Health

## 2017-10-17 DIAGNOSIS — F329 Major depressive disorder, single episode, unspecified: Secondary | ICD-10-CM | POA: Diagnosis not present

## 2017-10-24 DIAGNOSIS — F329 Major depressive disorder, single episode, unspecified: Secondary | ICD-10-CM | POA: Diagnosis not present

## 2017-11-02 ENCOUNTER — Other Ambulatory Visit: Payer: Self-pay

## 2017-11-02 IMAGING — DX DG ABDOMEN 1V
2 series · 2 of 2 positions shown · non-contrast
Comparison: CT 05/27/2015

CLINICAL DATA: Calculus of ureter

EXAM:
ABDOMEN - 1 VIEW

[abdomen kub (1 of 2)]
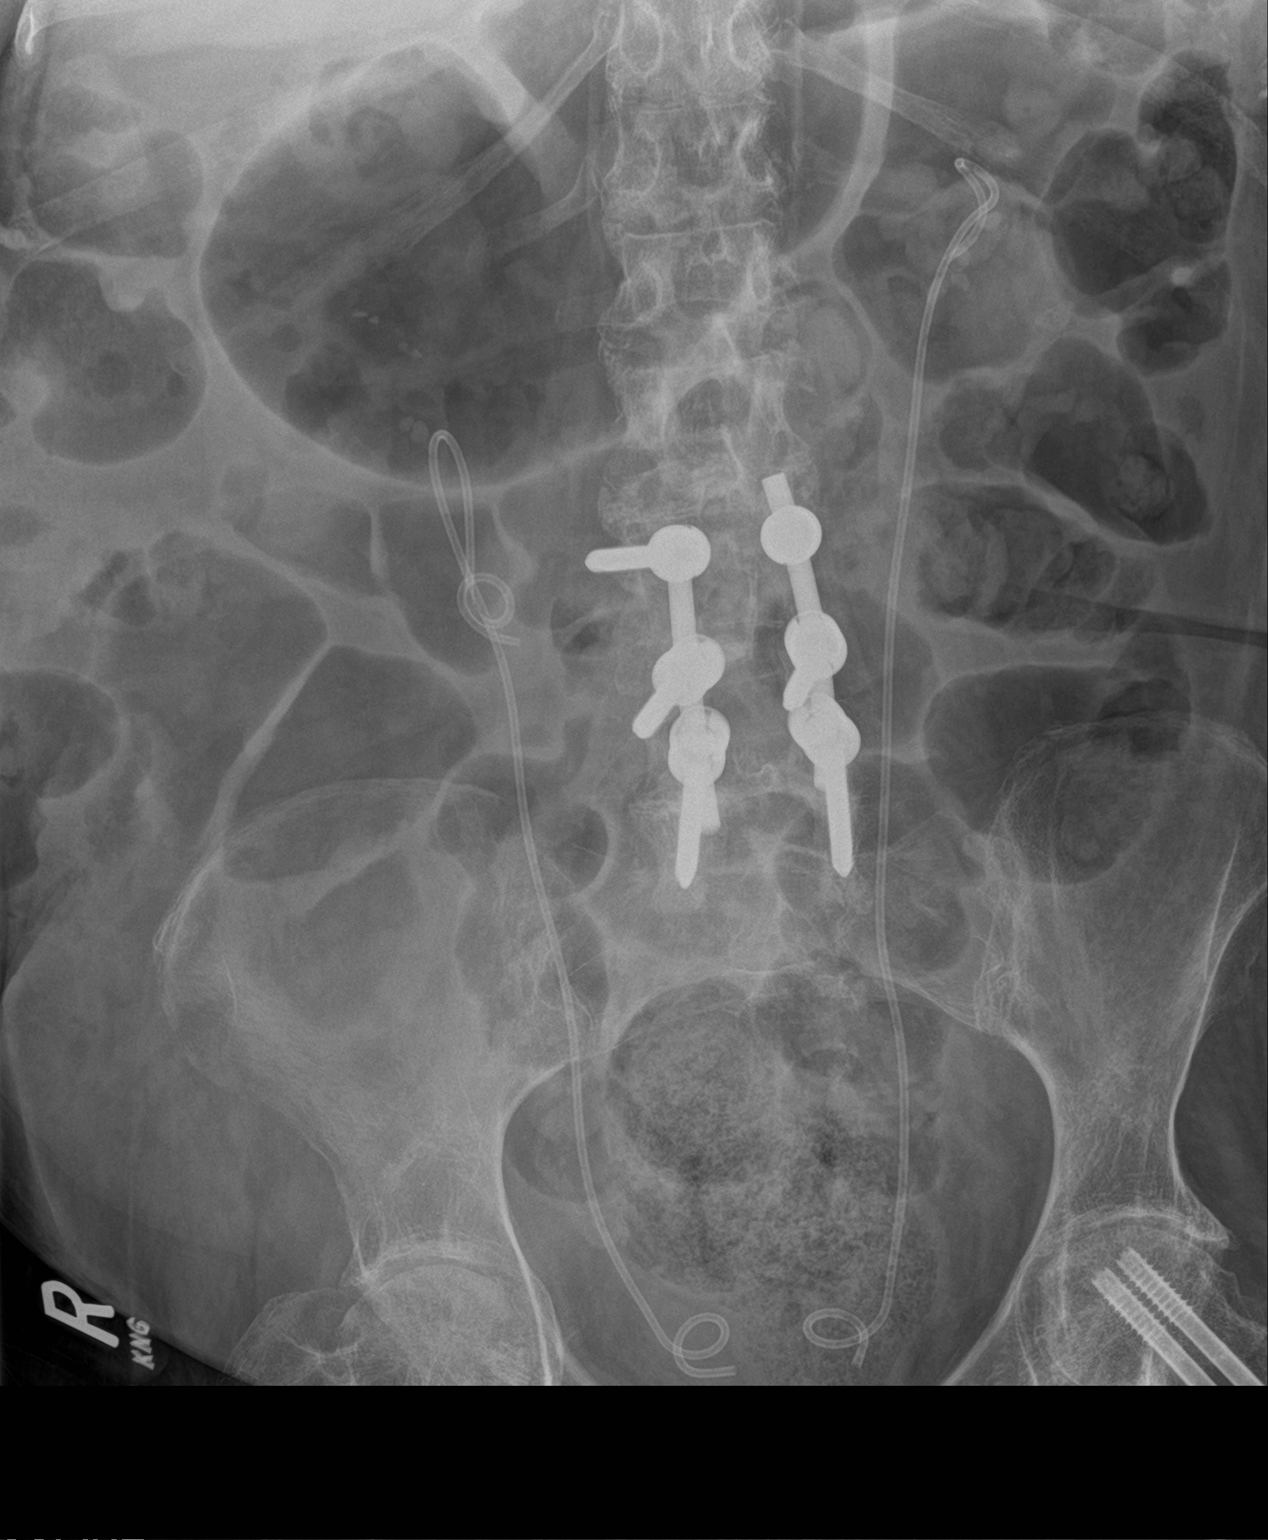

[abdomen kub (2 of 2)]
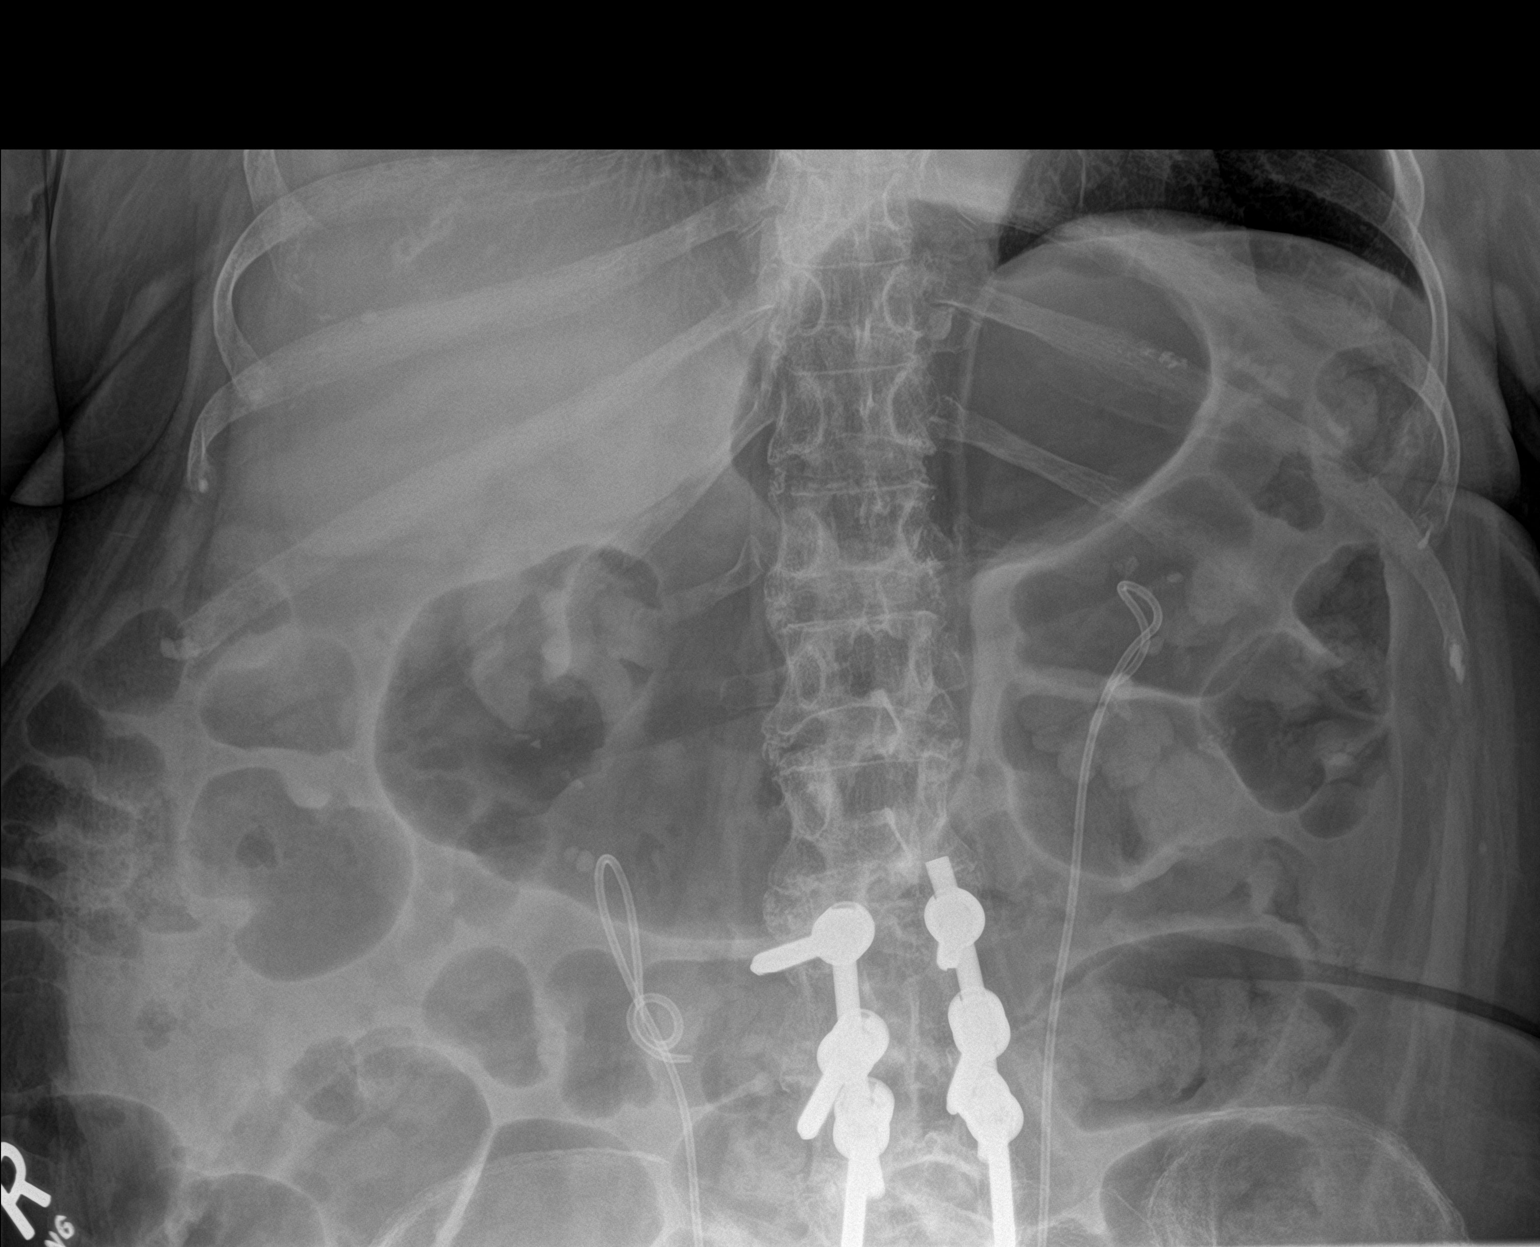

[2 of 2 positions shown; findings below may reference images not displayed]

FINDINGS: Bilateral ureteral stents are in place. The right ureteral stent
appears to in the proximal ureter or renal pelvis with the locking
loop in the proximal ureter. Bilateral nephrolithiasis noted.

Mild gaseous distention of bowel. No evidence of bowel obstruction.
No free air organomegaly.
IMPRESSION: Bilateral ureteral stents. The proximal right ureteral stent loops
in the proximal right ureter.

Bilateral nephrolithiasis.

## 2017-11-02 MED ORDER — OXYCODONE-ACETAMINOPHEN 5-325 MG PO TABS: 1.0000 | ORAL_TABLET | Freq: Four times a day (QID) | ORAL | 0 refills | Status: DC

## 2017-11-02 NOTE — Telephone Encounter (Signed)
Rx faxed to [facility] (P) [phone], (F) [phone] 

## 2017-11-07 DIAGNOSIS — F329 Major depressive disorder, single episode, unspecified: Secondary | ICD-10-CM | POA: Diagnosis not present

## 2017-11-09 ENCOUNTER — Encounter: Payer: Self-pay | Admitting: Adult Health

## 2017-11-09 ENCOUNTER — Non-Acute Institutional Stay (SKILLED_NURSING_FACILITY): Payer: Medicare Other | Admitting: Adult Health

## 2017-11-09 DIAGNOSIS — Z794 Long term (current) use of insulin: Secondary | ICD-10-CM | POA: Diagnosis not present

## 2017-11-09 DIAGNOSIS — F0280 Dementia in other diseases classified elsewhere without behavioral disturbance: Secondary | ICD-10-CM

## 2017-11-09 DIAGNOSIS — E119 Type 2 diabetes mellitus without complications: Secondary | ICD-10-CM

## 2017-11-09 DIAGNOSIS — G301 Alzheimer's disease with late onset: Secondary | ICD-10-CM | POA: Diagnosis not present

## 2017-11-09 DIAGNOSIS — I10 Essential (primary) hypertension: Secondary | ICD-10-CM

## 2017-11-09 DIAGNOSIS — E785 Hyperlipidemia, unspecified: Secondary | ICD-10-CM

## 2017-11-09 DIAGNOSIS — E1169 Type 2 diabetes mellitus with other specified complication: Secondary | ICD-10-CM | POA: Diagnosis not present

## 2017-11-09 DIAGNOSIS — E1159 Type 2 diabetes mellitus with other circulatory complications: Secondary | ICD-10-CM | POA: Diagnosis not present

## 2017-11-09 DIAGNOSIS — I152 Hypertension secondary to endocrine disorders: Secondary | ICD-10-CM

## 2017-11-09 NOTE — Progress Notes (Signed)
Location:   [facility] Room Number: 115 B Place of Service:  SNF (31)   CODE STATUS: DNR  No Known Allergies  Chief Complaint  Patient presents with  . Medical Management of Chronic Issues    Hypertension; diabetes; dyslipidemia; alzheimer's     HPI:  She is a 82 year old long term resident of this facility being seen for the management of her chronic illnesses: hypertension; diabetes; dyslipidemia; alzheimer's. She denies any excessive hunger or thirst; no uncontrolled pain.   Past Medical History:  Diagnosis Date  . Acute pyelonephritis   . Alzheimer's disease ([place])   . Bladder outlet obstruction   . Depression   . Diarrhea   . Environmental and seasonal allergies   . Essential hypertension, benign   . Foley catheter in place   . GERD (gastroesophageal reflux disease)   . History of urinary tract infection   . Hypertension   . Hypothyroidism   . Insomnia   . Nephrolithiasis   . Osteoarthritis   . Reflux esophagitis   . Rickets, active   . Sepsis ([place])   . Unspecified constipation   . Unspecified psychosis   . Unspecified vitamin D deficiency     Past Surgical History:  Procedure Laterality Date  . ABDOMINAL HYSTERECTOMY    . CYSTOSCOPY W/ URETERAL STENT PLACEMENT Bilateral 05/18/2015   Procedure: CYSTOSCOPY WITH RETROGRADE PYELOGRAM/URETERAL STENT PLACEMENT;  Surgeon: Carolan Clines, MD;  Location: [facility];  Service: Urology;  Laterality: Bilateral;  . CYSTOSCOPY W/ URETERAL STENT REMOVAL Bilateral 09/12/2015   Procedure: CYSTOSCOPY WITH STENT REMOVAL;  Surgeon: Carolan Clines, MD;  Location: [facility];  Service: Urology;  Laterality: Bilateral;  1 HOUR  . CYSTOSCOPY WITH RETROGRADE PYELOGRAM, URETEROSCOPY AND STENT PLACEMENT Bilateral 07/18/2015   Procedure: CYSTOSCOPY REMOVE LEFT DOUBLE J STENT LEFT RETROGRADE PYELOGRAM, LEFT URETEROSCOPY, PYELOSCOPY, REPLACEMENT OF LEFT DOUBLE J STENT;  Surgeon: Carolan Clines, MD;  Location: [facility];   Service: Urology;  Laterality: Bilateral;  . HOLMIUM LASER APPLICATION Bilateral 2/67/1245   Procedure: HOLMIUM LASER OF LEFT LOWER URETERAL STONE, LEFT PYELOSCOPY, REPLACE LEFT DOUBLE J STENT REMOVE RIGHT DOUBLE J STENT RIGHT RETROGRADE PYELOGRAM, REPLACEMENT RIGHT DOUBLE J STENT ;  Surgeon: Carolan Clines, MD;  Location: [facility];  Service: Urology;  Laterality: Bilateral;  . KNEE ARTHROSCOPY Left 1997    Social History   Socioeconomic History  . Marital status: Married    Spouse name: Not on file  . Number of children: Not on file  . Years of education: Not on file  . Highest education level: Not on file  Occupational History  . Not on file  Social Needs  . Financial resource strain: Not hard at all  . Food insecurity:    Worry: Never true    Inability: Never true  . Transportation needs:    Medical: No    Non-medical: No  Tobacco Use  . Smoking status: Never Smoker  . Smokeless tobacco: Never Used  Substance and Sexual Activity  . Alcohol use: No  . Drug use: No  . Sexual activity: Not on file  Lifestyle  . Physical activity:    Days per week: 0 days    Minutes per session: 0 min  . Stress: Not at all  Relationships  . Social connections:    Talks on phone: Once a week    Gets together: Once a week    Attends religious service: Never    Active member of club or organization: No  Attends meetings of clubs or organizations: Never    Relationship status: Married  . Intimate partner violence:    Fear of current or ex partner: No    Emotionally abused: No    Physically abused: No    Forced sexual activity: No  Other Topics Concern  . Not on file  Social History Narrative  . Not on file   Family History  Family history unknown: Yes      VITAL SIGNS Ht 4\' 11"  (1.499 m)   Wt 155 lb 3.2 oz (70.4 kg)   BMI 31.35 kg/m   Rest not available   Outpatient Encounter Medications as of 11/09/2017  Medication Sig  . acetaminophen (TYLENOL) 325 MG tablet Take  650 mg by mouth every 6 (six) hours as needed for moderate pain.  Marland Kitchen acetaminophen (TYLENOL) 500 MG tablet Take 1,000 mg by mouth daily. Pain management  . aspirin 325 MG tablet Take 325 mg by mouth daily.  Marland Kitchen atorvastatin (LIPITOR) 10 MG tablet Take 10 mg by mouth daily at 6 PM.  . cholecalciferol (VITAMIN D) 1000 UNITS tablet Take 2,000 Units by mouth daily.  . Insulin Detemir (LEVEMIR FLEXPEN) 100 UNIT/ML Pen Inject 12 Units into the skin every morning. And 12 units subcutaneously in the evening  . linaclotide (LINZESS) 72 MCG capsule Take 72 mcg by mouth daily.  Marland Kitchen lisinopril (PRINIVIL,ZESTRIL) 5 MG tablet Take 5 mg by mouth daily.   . metoprolol tartrate (LOPRESSOR) 25 MG tablet Take 25 mg by mouth 2 (two) times daily. For HTN  Hold if B/P < 100/60 or HR < 60  . Multiple Vitamin (MULTIVITAMIN) tablet Take 1 tablet by mouth daily. Wound healing  . nitrofurantoin, macrocrystal-monohydrate, (MACROBID) 100 MG capsule Take 1 capsule (100 mg total) by mouth at bedtime.  . Nutritional Supplements (NUTRITIONAL SUPPLEMENT PO) Regular Diet - Regular texture, Regular consistency  . nystatin (NYSTATIN) powder Apply to bilateral breast topically two times daily for rash  . Omega-3 Fatty Acids (FISH OIL OMEGA-3 PO) Give 1 capsule by mouth every morning  . oxyCODONE-acetaminophen (PERCOCET/ROXICET) 5-325 MG tablet Take 1 tablet by mouth every 6 (six) hours.  . Polyvinyl Alcohol-Povidone (FRESHKOTE) 2.7-2 % SOLN Apply 1 drop to eye 3 (three) times daily. To both eyes  . triamcinolone (KENALOG) 0.025 % cream Apply to bilateral feet topically two times daily for excessive dry skin   No facility-administered encounter medications on file as of 11/09/2017.      SIGNIFICANT DIAGNOSTIC EXAMS  PREVIOUS   05-18-15: ct of abdomen and pelvis: 1. Motion and hardware degraded images. 2. Moderate left-sided hydroureteronephrosis secondary to a distal left ureteric 11 x 13 mm calculus. Decreased left-sided renal  function. 3. Large burden bilateral renal collecting system stones, including stones within both extrarenal pelves. 4. Cirrhosis with small volume ascites. 5. Proctitis, likely related to C. difficile colitis, given the clinical history. 6. Bladder wall irregularity which could relate to cystitis. Bladder diverticulum, suggesting a component of outlet obstruction. 7. Skin irregularity superficial to the coccyx. Recommend physical exam correlation to exclude decubitus ulcer. No gross osseous destruction.   NO NEW EXAMS     LABS REVIEWED: PREVIOUS   11-16-16: glucose 97; bun 18.2; creat 0.77; k+ 5.0; na++ 136; ca 9.3 02-14-17: glucose 143; bun 25.9; creat 0.91; k+ 5.1; na++ 140; ca 9.6; liver normal albumin 4.0; tsh 1.01; hgb a1c 6.7; chol 127; ldl 64; trig 143; hdl 34  06-17-17: hgb a1c 6.0   08-24-17: wbc 9.7; hgb 13.5; hct  38.4; mcv 97.8 plt 241; glucose 113; bun 17.5; creat 0.63; k+ 5.0; na++ 138; liver normal albumin 3.9; chol 139; ldl 60; trig 191; hdl 35; hgb a1c 6.9    NO NEW LABS.      Review of Systems  Constitutional: Negative for malaise/fatigue.  Respiratory: Negative for cough and shortness of breath.   Cardiovascular: Negative for chest pain, palpitations and leg swelling.  Gastrointestinal: Negative for abdominal pain, constipation and heartburn.  Musculoskeletal: Negative for back pain, joint pain and myalgias.  Skin: Negative.   Neurological: Negative for dizziness.  Psychiatric/Behavioral: The patient is not nervous/anxious.       Physical Exam  Constitutional: She appears well-developed and well-nourished. No distress.  Neck: No thyromegaly present.  Cardiovascular: Normal rate, regular rhythm and intact distal pulses.  Murmur heard. 1/6  Pulmonary/Chest: Effort normal and breath sounds normal. No respiratory distress.  Abdominal: Soft. Bowel sounds are normal. She exhibits no distension. There is no tenderness.  Musculoskeletal: She exhibits no edema.  Able to  move all extremities Spends all of time in bed per her choice    Lymphadenopathy:    She has no cervical adenopathy.  Neurological: She is alert.  Skin: Skin is warm and dry. She is not diaphoretic.  Psychiatric: She has a normal mood and affect.     ASSESSMENT/ PLAN:  TODAY:   1.  Hypertension associated with diabetes : is stable; will continue lisinopril 5 mg daily; and lopressor 25 mg twice daily  Asa 325 mg daily  2. Diabetes mellitus type II insulin dependent:  hgb a1c is 6.9 (prevoius 6.0)  She is presently stable her cbgs are good ; will continue  lantus  12 units every 12 hours  She is on a statin and ace and asa   3. Dyslipidemia associated with type 2 diabetes mellitus: ldl is 60 ; is stable will continue lipitor 10 mg daily and fish oil 1 gm daily    4.  Alzheimer's disease, late onset without behavioral disturbance: no change in status; is presently not taking medications will not make changes will monitor her status.  Her current weight is 155 pounds; which is presently stable will monitor   PREVIOUS   5.  Chronic constipation:stable will continue linzess 72 mcg daily  6. Osteoarthritis, primary multiple joints: is stable  pain is presently being managed; will continue tylenol 1 gm daily and percocet 5/325 mg every 6 hours routinely   7. Major depression with psychotic features: is presently stable is presently not taking medications; will monitor   8. Nephrolithiasis; outlet obstruction: is on long term macrobid daily  is followed by urology.   9. Weight loss, non intentional : her current weight is 155 pounds; her weight in April 2018: 162 pounds. Will continue supplements per facility protocol.     MD is aware of resident's narcotic use and is in agreement with current plan of care. We will attempt to wean resident as apropriate   Ok Edwards NP [facility]  Contact [phone] Monday through Friday 8am- 5pm  After hours call [phone]

## 2017-11-19 DIAGNOSIS — Z23 Encounter for immunization: Secondary | ICD-10-CM | POA: Diagnosis not present

## 2017-11-21 DIAGNOSIS — R293 Abnormal posture: Secondary | ICD-10-CM | POA: Diagnosis not present

## 2017-11-21 DIAGNOSIS — E1159 Type 2 diabetes mellitus with other circulatory complications: Secondary | ICD-10-CM | POA: Diagnosis not present

## 2017-11-23 DIAGNOSIS — M6281 Muscle weakness (generalized): Secondary | ICD-10-CM | POA: Diagnosis not present

## 2017-11-29 DIAGNOSIS — F329 Major depressive disorder, single episode, unspecified: Secondary | ICD-10-CM | POA: Diagnosis not present

## 2017-11-30 DIAGNOSIS — L2089 Other atopic dermatitis: Secondary | ICD-10-CM | POA: Diagnosis not present

## 2017-11-30 DIAGNOSIS — N39498 Other specified urinary incontinence: Secondary | ICD-10-CM | POA: Diagnosis not present

## 2017-12-01 DIAGNOSIS — E1051 Type 1 diabetes mellitus with diabetic peripheral angiopathy without gangrene: Secondary | ICD-10-CM | POA: Diagnosis not present

## 2017-12-01 DIAGNOSIS — L603 Nail dystrophy: Secondary | ICD-10-CM | POA: Diagnosis not present

## 2017-12-01 DIAGNOSIS — Z794 Long term (current) use of insulin: Secondary | ICD-10-CM | POA: Diagnosis not present

## 2017-12-05 DIAGNOSIS — F329 Major depressive disorder, single episode, unspecified: Secondary | ICD-10-CM | POA: Diagnosis not present

## 2017-12-07 DIAGNOSIS — M6281 Muscle weakness (generalized): Secondary | ICD-10-CM | POA: Diagnosis not present

## 2017-12-07 DIAGNOSIS — L2089 Other atopic dermatitis: Secondary | ICD-10-CM | POA: Diagnosis not present

## 2017-12-07 DIAGNOSIS — N39498 Other specified urinary incontinence: Secondary | ICD-10-CM | POA: Diagnosis not present

## 2017-12-12 DIAGNOSIS — F329 Major depressive disorder, single episode, unspecified: Secondary | ICD-10-CM | POA: Diagnosis not present

## 2017-12-14 DIAGNOSIS — L2089 Other atopic dermatitis: Secondary | ICD-10-CM | POA: Diagnosis not present

## 2017-12-14 DIAGNOSIS — N39498 Other specified urinary incontinence: Secondary | ICD-10-CM | POA: Diagnosis not present

## 2017-12-21 DIAGNOSIS — N39498 Other specified urinary incontinence: Secondary | ICD-10-CM | POA: Diagnosis not present

## 2017-12-21 DIAGNOSIS — L2089 Other atopic dermatitis: Secondary | ICD-10-CM | POA: Diagnosis not present

## 2017-12-21 DIAGNOSIS — F329 Major depressive disorder, single episode, unspecified: Secondary | ICD-10-CM | POA: Diagnosis not present

## 2017-12-22 DIAGNOSIS — K5904 Chronic idiopathic constipation: Secondary | ICD-10-CM | POA: Diagnosis not present

## 2017-12-22 DIAGNOSIS — F0280 Dementia in other diseases classified elsewhere without behavioral disturbance: Secondary | ICD-10-CM | POA: Diagnosis not present

## 2017-12-22 DIAGNOSIS — E1159 Type 2 diabetes mellitus with other circulatory complications: Secondary | ICD-10-CM | POA: Diagnosis not present

## 2017-12-22 DIAGNOSIS — I10 Essential (primary) hypertension: Secondary | ICD-10-CM | POA: Diagnosis not present

## 2017-12-30 DIAGNOSIS — M21372 Foot drop, left foot: Secondary | ICD-10-CM | POA: Diagnosis not present

## 2017-12-30 DIAGNOSIS — I10 Essential (primary) hypertension: Secondary | ICD-10-CM | POA: Diagnosis not present

## 2017-12-30 DIAGNOSIS — M21371 Foot drop, right foot: Secondary | ICD-10-CM | POA: Diagnosis not present

## 2017-12-30 DIAGNOSIS — E1159 Type 2 diabetes mellitus with other circulatory complications: Secondary | ICD-10-CM | POA: Diagnosis not present

## 2017-12-31 DIAGNOSIS — E039 Hypothyroidism, unspecified: Secondary | ICD-10-CM | POA: Diagnosis not present

## 2017-12-31 DIAGNOSIS — E785 Hyperlipidemia, unspecified: Secondary | ICD-10-CM | POA: Diagnosis not present

## 2017-12-31 DIAGNOSIS — E119 Type 2 diabetes mellitus without complications: Secondary | ICD-10-CM | POA: Diagnosis not present

## 2017-12-31 DIAGNOSIS — E559 Vitamin D deficiency, unspecified: Secondary | ICD-10-CM | POA: Diagnosis not present

## 2017-12-31 DIAGNOSIS — Z79899 Other long term (current) drug therapy: Secondary | ICD-10-CM | POA: Diagnosis not present

## 2017-12-31 DIAGNOSIS — D649 Anemia, unspecified: Secondary | ICD-10-CM | POA: Diagnosis not present

## 2018-01-02 DIAGNOSIS — F329 Major depressive disorder, single episode, unspecified: Secondary | ICD-10-CM | POA: Diagnosis not present

## 2018-01-03 DIAGNOSIS — Z79899 Other long term (current) drug therapy: Secondary | ICD-10-CM | POA: Diagnosis not present

## 2018-01-03 DIAGNOSIS — E039 Hypothyroidism, unspecified: Secondary | ICD-10-CM | POA: Diagnosis not present

## 2018-01-03 DIAGNOSIS — D649 Anemia, unspecified: Secondary | ICD-10-CM | POA: Diagnosis not present

## 2018-01-03 DIAGNOSIS — I10 Essential (primary) hypertension: Secondary | ICD-10-CM | POA: Diagnosis not present

## 2018-01-03 DIAGNOSIS — E785 Hyperlipidemia, unspecified: Secondary | ICD-10-CM | POA: Diagnosis not present

## 2018-01-03 DIAGNOSIS — E119 Type 2 diabetes mellitus without complications: Secondary | ICD-10-CM | POA: Diagnosis not present

## 2018-01-03 DIAGNOSIS — E559 Vitamin D deficiency, unspecified: Secondary | ICD-10-CM | POA: Diagnosis not present

## 2018-01-11 DIAGNOSIS — F329 Major depressive disorder, single episode, unspecified: Secondary | ICD-10-CM | POA: Diagnosis not present

## 2018-01-16 DIAGNOSIS — F329 Major depressive disorder, single episode, unspecified: Secondary | ICD-10-CM | POA: Diagnosis not present

## 2018-01-19 DIAGNOSIS — E1159 Type 2 diabetes mellitus with other circulatory complications: Secondary | ICD-10-CM | POA: Diagnosis not present

## 2018-01-19 DIAGNOSIS — M21371 Foot drop, right foot: Secondary | ICD-10-CM | POA: Diagnosis not present

## 2018-01-19 DIAGNOSIS — N319 Neuromuscular dysfunction of bladder, unspecified: Secondary | ICD-10-CM | POA: Diagnosis not present

## 2018-01-19 DIAGNOSIS — K5904 Chronic idiopathic constipation: Secondary | ICD-10-CM | POA: Diagnosis not present

## 2018-01-30 DIAGNOSIS — K219 Gastro-esophageal reflux disease without esophagitis: Secondary | ICD-10-CM | POA: Diagnosis not present

## 2018-01-30 DIAGNOSIS — F0280 Dementia in other diseases classified elsewhere without behavioral disturbance: Secondary | ICD-10-CM | POA: Diagnosis not present

## 2018-01-30 DIAGNOSIS — E1159 Type 2 diabetes mellitus with other circulatory complications: Secondary | ICD-10-CM | POA: Diagnosis not present

## 2018-01-30 DIAGNOSIS — I10 Essential (primary) hypertension: Secondary | ICD-10-CM | POA: Diagnosis not present

## 2018-02-04 DIAGNOSIS — F329 Major depressive disorder, single episode, unspecified: Secondary | ICD-10-CM | POA: Diagnosis not present

## 2018-02-08 DIAGNOSIS — F329 Major depressive disorder, single episode, unspecified: Secondary | ICD-10-CM | POA: Diagnosis not present

## 2018-02-11 DIAGNOSIS — Z79899 Other long term (current) drug therapy: Secondary | ICD-10-CM | POA: Diagnosis not present

## 2018-02-11 DIAGNOSIS — R319 Hematuria, unspecified: Secondary | ICD-10-CM | POA: Diagnosis not present

## 2018-02-11 DIAGNOSIS — N390 Urinary tract infection, site not specified: Secondary | ICD-10-CM | POA: Diagnosis not present

## 2018-02-16 DIAGNOSIS — N319 Neuromuscular dysfunction of bladder, unspecified: Secondary | ICD-10-CM | POA: Diagnosis not present

## 2018-02-16 DIAGNOSIS — F329 Major depressive disorder, single episode, unspecified: Secondary | ICD-10-CM | POA: Diagnosis not present

## 2018-02-16 DIAGNOSIS — E1159 Type 2 diabetes mellitus with other circulatory complications: Secondary | ICD-10-CM | POA: Diagnosis not present

## 2018-02-16 DIAGNOSIS — K219 Gastro-esophageal reflux disease without esophagitis: Secondary | ICD-10-CM | POA: Diagnosis not present

## 2018-02-16 DIAGNOSIS — I10 Essential (primary) hypertension: Secondary | ICD-10-CM | POA: Diagnosis not present

## 2018-02-22 DIAGNOSIS — F329 Major depressive disorder, single episode, unspecified: Secondary | ICD-10-CM | POA: Diagnosis not present

## 2018-02-27 DIAGNOSIS — I10 Essential (primary) hypertension: Secondary | ICD-10-CM | POA: Diagnosis not present

## 2018-02-27 DIAGNOSIS — K219 Gastro-esophageal reflux disease without esophagitis: Secondary | ICD-10-CM | POA: Diagnosis not present

## 2018-02-27 DIAGNOSIS — N319 Neuromuscular dysfunction of bladder, unspecified: Secondary | ICD-10-CM | POA: Diagnosis not present

## 2018-02-27 DIAGNOSIS — F329 Major depressive disorder, single episode, unspecified: Secondary | ICD-10-CM | POA: Diagnosis not present

## 2018-02-27 DIAGNOSIS — E1159 Type 2 diabetes mellitus with other circulatory complications: Secondary | ICD-10-CM | POA: Diagnosis not present

## 2018-03-01 DIAGNOSIS — R112 Nausea with vomiting, unspecified: Secondary | ICD-10-CM | POA: Diagnosis not present

## 2018-03-01 DIAGNOSIS — K8020 Calculus of gallbladder without cholecystitis without obstruction: Secondary | ICD-10-CM | POA: Diagnosis not present

## 2018-03-01 DIAGNOSIS — R1084 Generalized abdominal pain: Secondary | ICD-10-CM | POA: Diagnosis not present

## 2018-03-01 DIAGNOSIS — I10 Essential (primary) hypertension: Secondary | ICD-10-CM | POA: Diagnosis not present

## 2018-03-01 DIAGNOSIS — E1159 Type 2 diabetes mellitus with other circulatory complications: Secondary | ICD-10-CM | POA: Diagnosis not present

## 2018-03-03 ENCOUNTER — Encounter (HOSPITAL_COMMUNITY): Payer: Self-pay | Admitting: Certified Registered Nurse Anesthetist

## 2018-03-03 ENCOUNTER — Inpatient Hospital Stay (HOSPITAL_COMMUNITY): Payer: Medicare Other | Admitting: Certified Registered Nurse Anesthetist

## 2018-03-03 ENCOUNTER — Inpatient Hospital Stay (HOSPITAL_COMMUNITY)
Admission: EM | Admit: 2018-03-03 | Discharge: 2018-03-06 | DRG: 854 | Disposition: A | Discharge status: Skilled Nursing Facility | Payer: Medicare Other | Attending: Internal Medicine | Admitting: Internal Medicine

## 2018-03-03 ENCOUNTER — Encounter (HOSPITAL_COMMUNITY)
Admission: EM | Disposition: A | Discharge status: Skilled Nursing Facility | Payer: Self-pay | Source: Home / Self Care | Attending: Internal Medicine

## 2018-03-03 ENCOUNTER — Inpatient Hospital Stay (HOSPITAL_COMMUNITY): Payer: Medicare Other

## 2018-03-03 ENCOUNTER — Emergency Department (HOSPITAL_COMMUNITY): Payer: Medicare Other

## 2018-03-03 ENCOUNTER — Other Ambulatory Visit: Payer: Self-pay

## 2018-03-03 DIAGNOSIS — A4159 Other Gram-negative sepsis: Principal | ICD-10-CM | POA: Diagnosis present

## 2018-03-03 DIAGNOSIS — M1990 Unspecified osteoarthritis, unspecified site: Secondary | ICD-10-CM | POA: Diagnosis present

## 2018-03-03 DIAGNOSIS — Z87440 Personal history of urinary (tract) infections: Secondary | ICD-10-CM | POA: Diagnosis not present

## 2018-03-03 DIAGNOSIS — N12 Tubulo-interstitial nephritis, not specified as acute or chronic: Secondary | ICD-10-CM | POA: Diagnosis present

## 2018-03-03 DIAGNOSIS — N390 Urinary tract infection, site not specified: Secondary | ICD-10-CM

## 2018-03-03 DIAGNOSIS — Z794 Long term (current) use of insulin: Secondary | ICD-10-CM | POA: Diagnosis not present

## 2018-03-03 DIAGNOSIS — E1169 Type 2 diabetes mellitus with other specified complication: Secondary | ICD-10-CM | POA: Diagnosis not present

## 2018-03-03 DIAGNOSIS — R488 Other symbolic dysfunctions: Secondary | ICD-10-CM | POA: Diagnosis present

## 2018-03-03 DIAGNOSIS — N200 Calculus of kidney: Secondary | ICD-10-CM

## 2018-03-03 DIAGNOSIS — D72829 Elevated white blood cell count, unspecified: Secondary | ICD-10-CM

## 2018-03-03 DIAGNOSIS — R7881 Bacteremia: Secondary | ICD-10-CM | POA: Diagnosis not present

## 2018-03-03 DIAGNOSIS — I10 Essential (primary) hypertension: Secondary | ICD-10-CM | POA: Diagnosis present

## 2018-03-03 DIAGNOSIS — Z466 Encounter for fitting and adjustment of urinary device: Secondary | ICD-10-CM | POA: Diagnosis not present

## 2018-03-03 DIAGNOSIS — E860 Dehydration: Secondary | ICD-10-CM | POA: Diagnosis present

## 2018-03-03 DIAGNOSIS — N1330 Unspecified hydronephrosis: Secondary | ICD-10-CM | POA: Diagnosis not present

## 2018-03-03 DIAGNOSIS — Z7401 Bed confinement status: Secondary | ICD-10-CM | POA: Diagnosis not present

## 2018-03-03 DIAGNOSIS — Z419 Encounter for procedure for purposes other than remedying health state, unspecified: Secondary | ICD-10-CM

## 2018-03-03 DIAGNOSIS — A419 Sepsis, unspecified organism: Secondary | ICD-10-CM

## 2018-03-03 DIAGNOSIS — Z7982 Long term (current) use of aspirin: Secondary | ICD-10-CM

## 2018-03-03 DIAGNOSIS — Z79899 Other long term (current) drug therapy: Secondary | ICD-10-CM | POA: Diagnosis not present

## 2018-03-03 DIAGNOSIS — N201 Calculus of ureter: Secondary | ICD-10-CM | POA: Diagnosis present

## 2018-03-03 DIAGNOSIS — R112 Nausea with vomiting, unspecified: Secondary | ICD-10-CM | POA: Diagnosis not present

## 2018-03-03 DIAGNOSIS — N136 Pyonephrosis: Secondary | ICD-10-CM | POA: Diagnosis present

## 2018-03-03 DIAGNOSIS — Z66 Do not resuscitate: Secondary | ICD-10-CM | POA: Diagnosis present

## 2018-03-03 DIAGNOSIS — R52 Pain, unspecified: Secondary | ICD-10-CM | POA: Diagnosis not present

## 2018-03-03 DIAGNOSIS — N10 Acute tubulo-interstitial nephritis: Secondary | ICD-10-CM | POA: Diagnosis present

## 2018-03-03 DIAGNOSIS — R6520 Severe sepsis without septic shock: Secondary | ICD-10-CM

## 2018-03-03 DIAGNOSIS — E876 Hypokalemia: Secondary | ICD-10-CM | POA: Diagnosis not present

## 2018-03-03 DIAGNOSIS — E039 Hypothyroidism, unspecified: Secondary | ICD-10-CM | POA: Diagnosis present

## 2018-03-03 DIAGNOSIS — F0150 Vascular dementia without behavioral disturbance: Secondary | ICD-10-CM | POA: Diagnosis present

## 2018-03-03 DIAGNOSIS — R1084 Generalized abdominal pain: Secondary | ICD-10-CM | POA: Diagnosis not present

## 2018-03-03 DIAGNOSIS — Z90710 Acquired absence of both cervix and uterus: Secondary | ICD-10-CM

## 2018-03-03 DIAGNOSIS — N3000 Acute cystitis without hematuria: Secondary | ICD-10-CM | POA: Diagnosis not present

## 2018-03-03 DIAGNOSIS — N179 Acute kidney failure, unspecified: Secondary | ICD-10-CM | POA: Diagnosis present

## 2018-03-03 DIAGNOSIS — N132 Hydronephrosis with renal and ureteral calculous obstruction: Secondary | ICD-10-CM | POA: Diagnosis not present

## 2018-03-03 DIAGNOSIS — Z87442 Personal history of urinary calculi: Secondary | ICD-10-CM | POA: Diagnosis not present

## 2018-03-03 DIAGNOSIS — Z960 Presence of urogenital implants: Secondary | ICD-10-CM | POA: Diagnosis present

## 2018-03-03 DIAGNOSIS — F0280 Dementia in other diseases classified elsewhere without behavioral disturbance: Secondary | ICD-10-CM | POA: Diagnosis present

## 2018-03-03 DIAGNOSIS — R531 Weakness: Secondary | ICD-10-CM | POA: Diagnosis not present

## 2018-03-03 DIAGNOSIS — R509 Fever, unspecified: Secondary | ICD-10-CM | POA: Diagnosis not present

## 2018-03-03 DIAGNOSIS — B964 Proteus (mirabilis) (morganii) as the cause of diseases classified elsewhere: Secondary | ICD-10-CM | POA: Diagnosis present

## 2018-03-03 DIAGNOSIS — E785 Hyperlipidemia, unspecified: Secondary | ICD-10-CM | POA: Diagnosis present

## 2018-03-03 DIAGNOSIS — N319 Neuromuscular dysfunction of bladder, unspecified: Secondary | ICD-10-CM | POA: Diagnosis present

## 2018-03-03 DIAGNOSIS — R918 Other nonspecific abnormal finding of lung field: Secondary | ICD-10-CM | POA: Diagnosis not present

## 2018-03-03 DIAGNOSIS — E119 Type 2 diabetes mellitus without complications: Secondary | ICD-10-CM | POA: Diagnosis not present

## 2018-03-03 DIAGNOSIS — G309 Alzheimer's disease, unspecified: Secondary | ICD-10-CM | POA: Diagnosis present

## 2018-03-03 DIAGNOSIS — I959 Hypotension, unspecified: Secondary | ICD-10-CM | POA: Diagnosis not present

## 2018-03-03 DIAGNOSIS — M2550 Pain in unspecified joint: Secondary | ICD-10-CM | POA: Diagnosis not present

## 2018-03-03 DIAGNOSIS — R000 Tachycardia, unspecified: Secondary | ICD-10-CM | POA: Diagnosis not present

## 2018-03-03 DIAGNOSIS — I4891 Unspecified atrial fibrillation: Secondary | ICD-10-CM | POA: Diagnosis not present

## 2018-03-03 HISTORY — PX: CYSTOSCOPY W/ URETERAL STENT PLACEMENT: SHX1429

## 2018-03-03 LAB — COMPREHENSIVE METABOLIC PANEL
ALK PHOS: 72 U/L (ref 38–126)
ALT: 18 U/L (ref 0–44)
AST: 20 U/L (ref 15–41)
Albumin: 2.4 g/dL — ABNORMAL LOW (ref 3.5–5.0)
Anion gap: 19 — ABNORMAL HIGH (ref 5–15)
BUN: 73 mg/dL — ABNORMAL HIGH (ref 8–23)
CALCIUM: 7.8 mg/dL — AB (ref 8.9–10.3)
CO2: 15 mmol/L — ABNORMAL LOW (ref 22–32)
Chloride: 99 mmol/L (ref 98–111)
Creatinine, Ser: 2.67 mg/dL — ABNORMAL HIGH (ref 0.44–1.00)
GFR calc Af Amer: 19 mL/min — ABNORMAL LOW (ref >60)
GFR calc non Af Amer: 16 mL/min — ABNORMAL LOW (ref >60)
Glucose, Bld: 176 mg/dL — ABNORMAL HIGH (ref 70–99)
Potassium: 3.3 mmol/L — ABNORMAL LOW (ref 3.5–5.1)
Sodium: 133 mmol/L — ABNORMAL LOW (ref 135–145)
Total Bilirubin: 1.2 mg/dL (ref 0.3–1.2)
Total Protein: 6.6 g/dL (ref 6.5–8.1)

## 2018-03-03 LAB — CBC WITH DIFFERENTIAL/PLATELET
Abs Immature Granulocytes: 0 10*3/uL (ref 0.00–0.07)
Basophils Absolute: 0 10*3/uL (ref 0.0–0.1)
Basophils Relative: 0 %
Eosinophils Absolute: 0 10*3/uL (ref 0.0–0.5)
Eosinophils Relative: 0 %
HCT: 34.9 % — ABNORMAL LOW (ref 36.0–46.0)
HEMOGLOBIN: 11.3 g/dL — AB (ref 12.0–15.0)
LYMPHS ABS: 0.6 10*3/uL — AB (ref 0.7–4.0)
Lymphocytes Relative: 4 %
MCH: 31.5 pg (ref 26.0–34.0)
MCHC: 32.4 g/dL (ref 30.0–36.0)
MCV: 97.2 fL (ref 80.0–100.0)
Monocytes Absolute: 0.6 10*3/uL (ref 0.1–1.0)
Monocytes Relative: 4 %
Neutro Abs: 14.8 10*3/uL — ABNORMAL HIGH (ref 1.7–7.7)
Neutrophils Relative %: 92 %
Platelets: 209 10*3/uL (ref 150–400)
RBC: 3.59 MIL/uL — ABNORMAL LOW (ref 3.87–5.11)
RDW: 12.9 % (ref 11.5–15.5)
WBC: 16.1 10*3/uL — ABNORMAL HIGH (ref 4.0–10.5)
nRBC: 0 % (ref 0.0–0.2)
nRBC: 0 /100 WBC

## 2018-03-03 LAB — URINALYSIS, ROUTINE W REFLEX MICROSCOPIC
BILIRUBIN URINE: NEGATIVE
Glucose, UA: NEGATIVE mg/dL
KETONES UR: 20 mg/dL — AB
Nitrite: NEGATIVE
Protein, ur: 100 mg/dL — AB
RBC / HPF: >50 RBC/hpf — ABNORMAL HIGH (ref 0–5)
Specific Gravity, Urine: 1.017 (ref 1.005–1.030)
WBC, UA: >50 WBC/hpf — ABNORMAL HIGH (ref 0–5)
pH: 7 (ref 5.0–8.0)

## 2018-03-03 LAB — INFLUENZA PANEL BY PCR (TYPE A & B)
Influenza A By PCR: NEGATIVE
Influenza B By PCR: NEGATIVE

## 2018-03-03 LAB — GLUCOSE, CAPILLARY
GLUCOSE-CAPILLARY: 115 mg/dL — AB (ref 70–99)
GLUCOSE-CAPILLARY: 95 mg/dL (ref 70–99)

## 2018-03-03 LAB — LIPASE, BLOOD: Lipase: 23 U/L (ref 11–51)

## 2018-03-03 LAB — LACTIC ACID, PLASMA
Lactic Acid, Venous: 1.7 mmol/L (ref 0.5–1.9)
Lactic Acid, Venous: 1 mmol/L (ref 0.5–1.9)

## 2018-03-03 SURGERY — CYSTOSCOPY, WITH RETROGRADE PYELOGRAM AND URETERAL STENT INSERTION
Anesthesia: General | Site: Urethra | Laterality: Bilateral

## 2018-03-03 MED ORDER — OXYCODONE HCL 5 MG/5ML PO SOLN: 5.0000 mg | Freq: Once | ORAL | Status: DC | PRN

## 2018-03-03 MED ORDER — PROPOFOL 10 MG/ML IV BOLUS
INTRAVENOUS | Status: DC | PRN
  Administered 2018-03-03: 100 mg via INTRAVENOUS

## 2018-03-03 MED ORDER — SODIUM CHLORIDE 0.9 % IV SOLN
INTRAVENOUS | Status: DC
  Administered 2018-03-03 – 2018-03-04 (×2): via INTRAVENOUS

## 2018-03-03 MED ORDER — PROPOFOL 10 MG/ML IV BOLUS
INTRAVENOUS | Status: AC
  Filled 2018-03-03: qty 20

## 2018-03-03 MED ORDER — ONDANSETRON HCL 4 MG PO TABS: 4.0000 mg | ORAL_TABLET | Freq: Four times a day (QID) | ORAL | Status: DC | PRN

## 2018-03-03 MED ORDER — EPHEDRINE 5 MG/ML INJ
INTRAVENOUS | Status: AC
  Filled 2018-03-03: qty 10

## 2018-03-03 MED ORDER — STERILE WATER FOR IRRIGATION IR SOLN
Status: DC | PRN
  Administered 2018-03-03: 3000 mL via INTRAVESICAL

## 2018-03-03 MED ORDER — ACETAMINOPHEN 10 MG/ML IV SOLN
INTRAVENOUS | Status: AC
  Administered 2018-03-03: 1000 mg via INTRAVENOUS
  Filled 2018-03-03: qty 100

## 2018-03-03 MED ORDER — DILTIAZEM HCL-DEXTROSE 100-5 MG/100ML-% IV SOLN (PREMIX)
5.0000 mg/h | INTRAVENOUS | Status: DC
  Administered 2018-03-03: 5 mg/h via INTRAVENOUS
  Filled 2018-03-03: qty 100

## 2018-03-03 MED ORDER — OXYCODONE HCL 5 MG PO TABS: 5.0000 mg | ORAL_TABLET | Freq: Once | ORAL | Status: DC | PRN

## 2018-03-03 MED ORDER — LIDOCAINE HCL URETHRAL/MUCOSAL 2 % EX GEL
CUTANEOUS | Status: AC
  Filled 2018-03-03: qty 20

## 2018-03-03 MED ORDER — PROMETHAZINE HCL 25 MG/ML IJ SOLN
INTRAMUSCULAR | Status: AC
  Administered 2018-03-03: 25 mg
  Filled 2018-03-03: qty 1

## 2018-03-03 MED ORDER — PHENYLEPHRINE HCL 10 MG/ML IJ SOLN
INTRAMUSCULAR | Status: DC | PRN
  Administered 2018-03-03: 80 ug via INTRAVENOUS

## 2018-03-03 MED ORDER — PHENYLEPHRINE 40 MCG/ML (10ML) SYRINGE FOR IV PUSH (FOR BLOOD PRESSURE SUPPORT)
PREFILLED_SYRINGE | INTRAVENOUS | Status: AC
  Filled 2018-03-03: qty 10

## 2018-03-03 MED ORDER — DEXAMETHASONE SODIUM PHOSPHATE 4 MG/ML IJ SOLN
INTRAMUSCULAR | Status: DC | PRN
  Administered 2018-03-03: 5 mg via INTRAVENOUS

## 2018-03-03 MED ORDER — ONDANSETRON HCL 4 MG/2ML IJ SOLN: 4.0000 mg | Freq: Four times a day (QID) | INTRAMUSCULAR | Status: DC | PRN

## 2018-03-03 MED ORDER — ACETAMINOPHEN 325 MG PO TABS: 650.0000 mg | ORAL_TABLET | Freq: Four times a day (QID) | ORAL | Status: DC | PRN

## 2018-03-03 MED ORDER — LIDOCAINE 2% (20 MG/ML) 5 ML SYRINGE
INTRAMUSCULAR | Status: AC
  Filled 2018-03-03: qty 5

## 2018-03-03 MED ORDER — LACTATED RINGERS IV SOLN
INTRAVENOUS | Status: DC | PRN
  Administered 2018-03-03: 19:00:00 via INTRAVENOUS

## 2018-03-03 MED ORDER — SODIUM CHLORIDE 0.9 % IV SOLN: 1.0000 g | INTRAVENOUS | Status: DC

## 2018-03-03 MED ORDER — ACETAMINOPHEN 650 MG RE SUPP: 650.0000 mg | Freq: Four times a day (QID) | RECTAL | Status: DC | PRN

## 2018-03-03 MED ORDER — FENTANYL CITRATE (PF) 100 MCG/2ML IJ SOLN: 25.0000 ug | INTRAMUSCULAR | Status: DC | PRN

## 2018-03-03 MED ORDER — ACETAMINOPHEN 10 MG/ML IV SOLN
1000.0000 mg | Freq: Once | INTRAVENOUS | Status: AC
  Administered 2018-03-03: 1000 mg via INTRAVENOUS

## 2018-03-03 MED ORDER — IODIXANOL 320 MG/ML IV SOLN
INTRAVENOUS | Status: DC | PRN
  Administered 2018-03-03: 50 mL

## 2018-03-03 MED ORDER — INSULIN ASPART 100 UNIT/ML ~~LOC~~ SOLN: 0.0000 [IU] | Freq: Every day | SUBCUTANEOUS | Status: DC

## 2018-03-03 MED ORDER — SODIUM CHLORIDE 0.9 % IV SOLN
Freq: Once | INTRAVENOUS | Status: AC
  Administered 2018-03-03: 15:00:00 via INTRAVENOUS

## 2018-03-03 MED ORDER — FENTANYL CITRATE (PF) 250 MCG/5ML IJ SOLN
INTRAMUSCULAR | Status: AC
  Filled 2018-03-03: qty 5

## 2018-03-03 MED ORDER — ACETAMINOPHEN 500 MG PO TABS: 1000.0000 mg | ORAL_TABLET | Freq: Once | ORAL | Status: AC

## 2018-03-03 MED ORDER — INSULIN ASPART 100 UNIT/ML ~~LOC~~ SOLN
0.0000 [IU] | Freq: Three times a day (TID) | SUBCUTANEOUS | Status: DC
  Administered 2018-03-05 – 2018-03-06 (×2): 2 [IU] via SUBCUTANEOUS

## 2018-03-03 MED ORDER — IOPAMIDOL (ISOVUE-300) INJECTION 61%
INTRAVENOUS | Status: AC
  Filled 2018-03-03: qty 50

## 2018-03-03 MED ORDER — DEXAMETHASONE SODIUM PHOSPHATE 10 MG/ML IJ SOLN
INTRAMUSCULAR | Status: AC
  Filled 2018-03-03: qty 1

## 2018-03-03 MED ORDER — ESMOLOL HCL 100 MG/10ML IV SOLN
10.0000 mg | Freq: Once | INTRAVENOUS | Status: AC
  Administered 2018-03-03: 10 mg via INTRAVENOUS
  Filled 2018-03-03 (×2): qty 1

## 2018-03-03 MED ORDER — SODIUM CHLORIDE 0.9 % IV SOLN
1.0000 g | Freq: Once | INTRAVENOUS | Status: AC
  Administered 2018-03-03: 1 g via INTRAVENOUS
  Filled 2018-03-03: qty 10

## 2018-03-03 MED ORDER — DILTIAZEM LOAD VIA INFUSION
10.0000 mg | Freq: Once | INTRAVENOUS | Status: AC
  Administered 2018-03-03: 10 mg via INTRAVENOUS
  Filled 2018-03-03: qty 10

## 2018-03-03 MED ORDER — KETOROLAC TROMETHAMINE 15 MG/ML IJ SOLN
15.0000 mg | Freq: Four times a day (QID) | INTRAMUSCULAR | Status: DC | PRN
  Administered 2018-03-03 – 2018-03-06 (×7): 15 mg via INTRAVENOUS
  Filled 2018-03-03 (×7): qty 1

## 2018-03-03 MED ORDER — EPHEDRINE SULFATE 50 MG/ML IJ SOLN
INTRAMUSCULAR | Status: DC | PRN
  Administered 2018-03-03: 10 mg via INTRAVENOUS

## 2018-03-03 MED ORDER — MIDAZOLAM HCL 2 MG/2ML IJ SOLN
INTRAMUSCULAR | Status: AC
  Administered 2018-03-03: 2 mg via INTRAVENOUS
  Filled 2018-03-03: qty 2

## 2018-03-03 MED ORDER — SUCCINYLCHOLINE CHLORIDE 200 MG/10ML IV SOSY
PREFILLED_SYRINGE | INTRAVENOUS | Status: AC
  Filled 2018-03-03: qty 10

## 2018-03-03 MED ORDER — LACTATED RINGERS IV BOLUS
1000.0000 mL | Freq: Once | INTRAVENOUS | Status: AC
  Administered 2018-03-03: 1000 mL via INTRAVENOUS

## 2018-03-03 MED ORDER — FENTANYL CITRATE (PF) 100 MCG/2ML IJ SOLN
INTRAMUSCULAR | Status: DC | PRN
  Administered 2018-03-03 (×2): 25 ug via INTRAVENOUS

## 2018-03-03 MED ORDER — SODIUM CHLORIDE 0.9 % IV SOLN: INTRAVENOUS | Status: DC

## 2018-03-03 MED ORDER — ONDANSETRON HCL 4 MG/2ML IJ SOLN
INTRAMUSCULAR | Status: AC
  Filled 2018-03-03: qty 2

## 2018-03-03 MED ORDER — MIDAZOLAM HCL 2 MG/2ML IJ SOLN: 1.0000 mg | Freq: Once | INTRAMUSCULAR | Status: DC

## 2018-03-03 MED ORDER — PROMETHAZINE HCL 25 MG/ML IJ SOLN: 6.2500 mg | Freq: Four times a day (QID) | INTRAMUSCULAR | Status: DC | PRN

## 2018-03-03 MED ORDER — ONDANSETRON HCL 4 MG/2ML IJ SOLN
4.0000 mg | Freq: Four times a day (QID) | INTRAMUSCULAR | Status: DC | PRN
  Administered 2018-03-03: 4 mg via INTRAVENOUS

## 2018-03-03 MED ORDER — LIDOCAINE HCL (CARDIAC) PF 100 MG/5ML IV SOSY
PREFILLED_SYRINGE | INTRAVENOUS | Status: DC | PRN
  Administered 2018-03-03: 60 mg via INTRAVENOUS

## 2018-03-03 SURGICAL SUPPLY — 31 items
ADAPTER CATH URET PLST 4-6FR (CATHETERS) IMPLANT
ADPR CATH URET STRL DISP 4-6FR (CATHETERS)
APL SKNCLS STERI-STRIP NONHPOA (GAUZE/BANDAGES/DRESSINGS)
BAG URINE DRAINAGE (UROLOGICAL SUPPLIES) ×3 IMPLANT
BAG URO CATCHER STRL LF (MISCELLANEOUS) ×3 IMPLANT
BENZOIN TINCTURE PRP APPL 2/3 (GAUZE/BANDAGES/DRESSINGS) IMPLANT
BLADE 10 SAFETY STRL DISP (BLADE) ×3 IMPLANT
BUCKET BIOHAZARD WASTE 5 GAL (MISCELLANEOUS) ×3 IMPLANT
CATH FOLEY 2WAY SLVR  5CC 16FR (CATHETERS) ×2
CATH FOLEY 2WAY SLVR 5CC 16FR (CATHETERS) IMPLANT
CATH URET 5FR 28IN CONE TIP (BALLOONS)
CATH URET 5FR 70CM CONE TIP (BALLOONS) IMPLANT
COVER WAND RF STERILE (DRAPES) ×3 IMPLANT
DRAPE CAMERA CLOSED 9X96 (DRAPES) ×6 IMPLANT
GOWN STRL REUS W/ TWL LRG LVL3 (GOWN DISPOSABLE) ×1 IMPLANT
GOWN STRL REUS W/ TWL XL LVL3 (GOWN DISPOSABLE) ×1 IMPLANT
GOWN STRL REUS W/TWL LRG LVL3 (GOWN DISPOSABLE) ×3
GOWN STRL REUS W/TWL XL LVL3 (GOWN DISPOSABLE) ×3
GUIDEWIRE COOK  .035 (WIRE) IMPLANT
KIT TURNOVER KIT B (KITS) ×3 IMPLANT
NS IRRIG 1000ML POUR BTL (IV SOLUTION) ×6 IMPLANT
PACK CYSTO (CUSTOM PROCEDURE TRAY) ×3 IMPLANT
PAD ARMBOARD 7.5X6 YLW CONV (MISCELLANEOUS) ×6 IMPLANT
PLUG CATH AND CAP STER (CATHETERS) IMPLANT
STENT URET 6FRX24 CONTOUR (STENTS) ×4 IMPLANT
SYRINGE CONTROL L 12CC (SYRINGE) ×3 IMPLANT
SYRINGE CONTROL LL 12CC (SYRINGE) ×1 IMPLANT
SYRINGE TOOMEY DISP (SYRINGE) IMPLANT
UNDERPAD 30X30 (UNDERPADS AND DIAPERS) ×3 IMPLANT
WATER STERILE IRR 1000ML POUR (IV SOLUTION) ×3 IMPLANT
WIRE COONS/BENSON .038X145CM (WIRE) IMPLANT

## 2018-03-03 NOTE — Anesthesia Preprocedure Evaluation (Signed)
Anesthesia Evaluation  Patient identified by MRN, date of birth, ID band Patient confused    Reviewed: Allergy & Precautions, NPO status , Patient's Chart, lab work & pertinent test results  Airway Mallampati: II   Neck ROM: full    Dental   Pulmonary neg pulmonary ROS,    breath sounds clear to auscultation       Cardiovascular hypertension,  Rhythm:regular Rate:Normal     Neuro/Psych PSYCHIATRIC DISORDERS Anxiety Depression Schizophrenia Dementia    GI/Hepatic GERD  ,  Endo/Other  diabetesHypothyroidism   Renal/GU Renal disease     Musculoskeletal  (+) Arthritis ,   Abdominal   Peds  Hematology   Anesthesia Other Findings   Reproductive/Obstetrics                             Anesthesia Physical Anesthesia Plan  ASA: III and emergent  Anesthesia Plan: General   Post-op Pain Management:    Induction: Intravenous  PONV Risk Score and Plan: 3 and Ondansetron, Dexamethasone and Treatment may vary due to age or medical condition  Airway Management Planned: LMA  Additional Equipment:   Intra-op Plan:   Post-operative Plan: Extubation in OR  Informed Consent: I have reviewed the patients History and Physical, chart, labs and discussed the procedure including the risks, benefits and alternatives for the proposed anesthesia with the patient or authorized representative who has indicated his/her understanding and acceptance.       Plan Discussed with: CRNA, Anesthesiologist and Surgeon  Anesthesia Plan Comments:         Anesthesia Quick Evaluation

## 2018-03-03 NOTE — ED Notes (Signed)
Attempted to call report x 1  

## 2018-03-03 NOTE — Progress Notes (Signed)
REceived report from [place] in ED

## 2018-03-03 NOTE — Anesthesia Procedure Notes (Signed)
Procedure Name: LMA Insertion Date/Time: 03/03/2018 7:22 PM Performed by: Oletta Lamas, CRNA Pre-anesthesia Checklist: Patient identified, Emergency Drugs available, Suction available and Patient being monitored Patient Re-evaluated:Patient Re-evaluated prior to induction Oxygen Delivery Method: Circle System Utilized Preoxygenation: Pre-oxygenation with 100% oxygen Induction Type: IV induction Ventilation: Mask ventilation without difficulty LMA: LMA inserted LMA Size: 4.0 Number of attempts: 1 Placement Confirmation: positive ETCO2 Tube secured with: Tape Dental Injury: Teeth and Oropharynx as per pre-operative assessment

## 2018-03-03 NOTE — ED Provider Notes (Addendum)
[place] EMERGENCY DEPARTMENT Provider Note   CSN: [id number] Arrival date & time: 03/03/18  1304     History   Chief Complaint Chief Complaint  Patient presents with  . Fever    HPI Ana Newman is a 83 y.o. female.  The history is provided by the patient.  Fever  Max temp prior to arrival:  102 Temp source:  Oral Severity:  Mild Onset quality:  Gradual Timing:  Constant Progression:  Unchanged Chronicity:  New Relieved by:  Acetaminophen Worsened by:  Nothing Associated symptoms: dysuria   Associated symptoms: no chest pain, no chills, no confusion, no congestion, no cough, no diarrhea, no ear pain, no headaches, no myalgias, no nausea, no rash, no rhinorrhea, no somnolence, no sore throat and no vomiting   Risk factors: no sick contacts     Past Medical History:  Diagnosis Date  . Acute pyelonephritis   . Alzheimer's disease ([place])   . Bladder outlet obstruction   . Depression   . Diarrhea   . Environmental and seasonal allergies   . Essential hypertension, benign   . Foley catheter in place   . GERD (gastroesophageal reflux disease)   . History of urinary tract infection   . Hypertension   . Hypothyroidism   . Insomnia   . Nephrolithiasis   . Osteoarthritis   . Reflux esophagitis   . Rickets, active   . Sepsis ([place])   . Unspecified constipation   . Unspecified psychosis   . Unspecified vitamin D deficiency     Patient Active Problem List   Diagnosis Date Noted  . Pyelonephritis 03/03/2018  . Leukocytosis 03/03/2018  . Ureterolithiasis 03/03/2018  . Late onset Alzheimer's disease without behavioral disturbance ([place]) 09/18/2017  . Major depression with psychotic features ([place]) 03/14/2017  . Weight loss, non-intentional 10/25/2016  . Chronic constipation 10/08/2016  . Hypertension associated with diabetes ([place]) 05/14/2016  . Bladder outlet obstruction 06/17/2015  . Nephrolithiasis 05/18/2015  . Sepsis ([place]) 05/17/2015    . AKI (acute kidney injury) ([place]) 05/17/2015  . Environmental and seasonal allergies 04/06/2015  . Dyslipidemia associated with type 2 diabetes mellitus ([place]) 02/04/2015  . Diabetes mellitus, type II, insulin dependent ([place]) 02/04/2015  . Depression 01/18/2014  . Hypothyroidism 01/18/2014  . Psychosis ([place])   . Osteoarthritis     Past Surgical History:  Procedure Laterality Date  . ABDOMINAL HYSTERECTOMY    . CYSTOSCOPY W/ URETERAL STENT PLACEMENT Bilateral 05/18/2015   Procedure: CYSTOSCOPY WITH RETROGRADE PYELOGRAM/URETERAL STENT PLACEMENT;  Surgeon: Carolan Clines, MD;  Location: [facility];  Service: Urology;  Laterality: Bilateral;  . CYSTOSCOPY W/ URETERAL STENT REMOVAL Bilateral 09/12/2015   Procedure: CYSTOSCOPY WITH STENT REMOVAL;  Surgeon: Carolan Clines, MD;  Location: [facility];  Service: Urology;  Laterality: Bilateral;  1 HOUR  . CYSTOSCOPY WITH RETROGRADE PYELOGRAM, URETEROSCOPY AND STENT PLACEMENT Bilateral 07/18/2015   Procedure: CYSTOSCOPY REMOVE LEFT DOUBLE J STENT LEFT RETROGRADE PYELOGRAM, LEFT URETEROSCOPY, PYELOSCOPY, REPLACEMENT OF LEFT DOUBLE J STENT;  Surgeon: Carolan Clines, MD;  Location: [facility];  Service: Urology;  Laterality: Bilateral;  . HOLMIUM LASER APPLICATION Bilateral [id number]   Procedure: HOLMIUM LASER OF LEFT LOWER URETERAL STONE, LEFT PYELOSCOPY, REPLACE LEFT DOUBLE J STENT REMOVE RIGHT DOUBLE J STENT RIGHT RETROGRADE PYELOGRAM, REPLACEMENT RIGHT DOUBLE J STENT ;  Surgeon: Carolan Clines, MD;  Location: [facility];  Service: Urology;  Laterality: Bilateral;  . KNEE ARTHROSCOPY Left 1997     OB History   No obstetric history on  file.      Home Medications    Prior to Admission medications   Medication Sig Start Date End Date Taking? Authorizing Provider  acetaminophen (TYLENOL) 325 MG tablet Take 650 mg by mouth every 6 (six) hours as needed for moderate pain.   Yes [provider]  acetaminophen (TYLENOL) 500 MG tablet Take  1,000 mg by mouth daily. Pain management   Yes [provider]  aspirin 325 MG tablet Take 325 mg by mouth daily.   Yes [provider]  atorvastatin (LIPITOR) 10 MG tablet Take 10 mg by mouth daily at 6 PM.   Yes [provider]  bisacodyl (DULCOLAX) 10 MG suppository Place 10 mg rectally as needed for moderate constipation.   Yes [provider]  cholecalciferol (VITAMIN D) 1000 UNITS tablet Take 2,000 Units by mouth daily.   Yes [provider]  docusate sodium (COLACE) 100 MG capsule Take 100 mg by mouth 2 (two) times daily.   Yes [provider]  Insulin Detemir (LEVEMIR FLEXPEN) 100 UNIT/ML Pen Inject 12 Units into the skin every morning. And 12 units subcutaneously in the evening   Yes [provider]  linaclotide (LINZESS) 72 MCG capsule Take 72 mcg by mouth daily.   Yes [provider]  lisinopril (PRINIVIL,ZESTRIL) 5 MG tablet Take 5 mg by mouth daily.    Yes [provider]  metoprolol tartrate (LOPRESSOR) 25 MG tablet Take 25 mg by mouth 2 (two) times daily. For HTN  Hold if B/P < 100/60 or HR < 60   Yes [provider]  Multiple Vitamin (MULTIVITAMIN) tablet Take 1 tablet by mouth daily. Wound healing 04/12/17  Yes [provider]  Omega-3 Fatty Acids (FISH OIL OMEGA-3 PO) Give 1 capsule by mouth every morning   Yes [provider]  ondansetron (ZOFRAN) 4 MG tablet Take 4 mg by mouth every 6 (six) hours as needed for nausea or vomiting.   Yes [provider]  oxyCODONE-acetaminophen (PERCOCET/ROXICET) 5-325 MG tablet Take 1 tablet by mouth every 6 (six) hours. 11/02/17  Yes Gerlene Fee, NP  polyethylene glycol (MIRALAX / GLYCOLAX) packet Take 17 g by mouth every 12 (twelve) hours as needed for mild constipation.   Yes [provider]  Polyvinyl Alcohol-Povidone (FRESHKOTE) 2.7-2 % SOLN Apply 1 drop to eye 3 (three) times daily. To both eyes   Yes [provider]  nitrofurantoin, macrocrystal-monohydrate, (MACROBID) 100 MG capsule Take 1 capsule (100 mg total) by mouth at bedtime. Patient not taking: Reported on 03/03/2018 09/12/15   Carolan Clines, MD  Nutritional Supplements (NUTRITIONAL SUPPLEMENT PO) Regular Diet - Regular texture, Regular consistency    [provider]    Family History Family History  Family history unknown: Yes    Social History Social History   Tobacco Use  . Smoking status: Never Smoker  . Smokeless tobacco: Never Used  Substance Use Topics  . Alcohol use: No  . Drug use: No     Allergies   Patient has no known allergies.   Review of Systems Review of Systems  Constitutional: Positive for fever. Negative for chills.  HENT: Negative for congestion, ear pain, rhinorrhea and sore throat.   Eyes: Negative for pain and visual disturbance.  Respiratory: Negative for cough and shortness of breath.   Cardiovascular: Negative for chest pain and palpitations.  Gastrointestinal: Positive for abdominal pain. Negative for diarrhea, nausea and vomiting.  Genitourinary: Positive for dysuria. Negative for hematuria.  Musculoskeletal:  Negative for arthralgias, back pain and myalgias.  Skin: Negative for color change and rash.  Neurological: Negative for seizures, syncope and headaches.  Psychiatric/Behavioral: Negative for confusion.  All other systems reviewed and are negative.    Physical Exam Updated Vital Signs  ED Triage Vitals  Enc Vitals Group     BP 03/03/18 1310 (!) 130/115     Pulse Rate 03/03/18 1310 (!) 115     Resp 03/03/18 1310 18     Temp 03/03/18 1310 (!) 101.6 F (38.7 C)     Temp Source 03/03/18 1310 Oral     SpO2 03/03/18 1308 97 %     Weight 03/03/18 1311 151 lb (68.5 kg)     Height 03/03/18 1311 5\' 2"  (1.575 m)     Head Circumference --      Peak Flow --      Pain Score --      Pain Loc --      Pain Edu? --      Excl. in [place]? --     Physical Exam Vitals  signs and nursing note reviewed.  Constitutional:      General: She is not in acute distress.    Appearance: She is well-developed. She is not ill-appearing.  HENT:     Head: Normocephalic and atraumatic.     Nose: Nose normal.     Mouth/Throat:     Mouth: Mucous membranes are dry.  Eyes:     Extraocular Movements: Extraocular movements intact.     Conjunctiva/sclera: Conjunctivae normal.     Pupils: Pupils are equal, round, and reactive to light.  Neck:     Musculoskeletal: Neck supple.  Cardiovascular:     Rate and Rhythm: Regular rhythm. Tachycardia present.     Pulses: Normal pulses.     Heart sounds: Normal heart sounds. No murmur.  Pulmonary:     Effort: Pulmonary effort is normal. No respiratory distress.     Breath sounds: Normal breath sounds.  Abdominal:     Palpations: Abdomen is soft.     Tenderness: There is abdominal tenderness (diffusely).  Musculoskeletal:     Right lower leg: No edema.     Left lower leg: No edema.  Skin:    General: Skin is warm and dry.     Capillary Refill: Capillary refill takes less than 2 seconds.  Neurological:     General: No focal deficit present.     Mental Status: She is alert.  Psychiatric:        Mood and Affect: Mood normal.      ED Treatments / Results  Labs (all labs ordered are listed, but only abnormal results are displayed) Labs Reviewed  COMPREHENSIVE METABOLIC PANEL - Abnormal; Notable for the following components:      Result Value   Sodium 133 (*)    Potassium 3.3 (*)    CO2 15 (*)    Glucose, Bld 176 (*)    BUN 73 (*)    Creatinine, Ser 2.67 (*)    Calcium 7.8 (*)    Albumin 2.4 (*)    GFR calc non Af Amer 16 (*)    GFR calc Af Amer 19 (*)    Anion gap 19 (*)    All other components within normal limits  CBC WITH DIFFERENTIAL/PLATELET - Abnormal; Notable for the following components:   WBC 16.1 (*)    RBC 3.59 (*)    Hemoglobin 11.3 (*)    HCT 34.9 (*)  Neutro Abs 14.8 (*)    Lymphs Abs 0.6 (*)     All other components within normal limits  URINALYSIS, ROUTINE W REFLEX MICROSCOPIC - Abnormal; Notable for the following components:   APPearance TURBID (*)    Hgb urine dipstick LARGE (*)    Ketones, ur 20 (*)    Protein, ur 100 (*)    Leukocytes, UA MODERATE (*)    RBC / HPF >50 (*)    WBC, UA >50 (*)    Bacteria, UA MANY (*)    Squamous Epithelial / LPF >50 (*)    Non Squamous Epithelial 21-50 (*)    All other components within normal limits  CULTURE, BLOOD (ROUTINE X 2)  CULTURE, BLOOD (ROUTINE X 2)  URINE CULTURE  LACTIC ACID, PLASMA  LACTIC ACID, PLASMA  LIPASE, BLOOD  INFLUENZA PANEL BY PCR (TYPE A & B)    EKG EKG Interpretation  Date/Time:  Friday March 03 2018 13:12:46 EST Ventricular Rate:  111 PR Interval:    QRS Duration: 85 QT Interval:  323 QTC Calculation: 439 R Axis:   2 Text Interpretation:  Sinus tachycardia Atrial premature complexes Consider right atrial enlargement Confirmed by Lennice Sites [phone]) on 03/03/2018 1:18:01 PM   Radiology Ct Abdomen Pelvis Wo Contrast  Result Date: 03/03/2018 CLINICAL DATA:  Abdominal distension. EXAM: CT ABDOMEN AND PELVIS WITHOUT CONTRAST TECHNIQUE: Multidetector CT imaging of the abdomen and pelvis was performed following the standard protocol without IV contrast. COMPARISON:  05/27/2015 FINDINGS: Lower chest: Small dependent pleural effusions with mild dependent basilar atelectasis. Lung bases are largely clear. Hepatobiliary: Gallbladder is distended. No calcified gallstones. No liver parenchymal abnormality seen. Pancreas: Negative Spleen: Negative Adrenals/Urinary Tract: No adrenal mass. No stones in the right kidney is self. There are 2 or 3 stones in the extrarenal pelvis on the right, apparently not causing obstruction presently. The remainder of the right ureter is normal. The left kidney contains multiple nonobstructing stones. There are a few stones in the renal collecting system and renal pelvis. The ureter  is markedly dilated all the way to the bladder where there is a UVJ stone measuring 7 8 mm in size. No stone in the bladder. Small bladder diverticulum on the left. Stomach/Bowel: No acute abnormal bowel finding. No evidence of ileus or obstruction. No inflammatory change seen. Some wall thickening in the sigmoid region could be due to mild chronic diverticulosis. Vascular/Lymphatic: Aortic atherosclerosis. No aneurysm. IVC is normal. No retroperitoneal adenopathy. Reproductive: Previous hysterectomy. No pelvic mass. Other: No free fluid or air. Musculoskeletal: Previous lumbosacral fusion. No acute bone finding. IMPRESSION: 1. 7-8 mm stone at the left UVJ with marked left hydroureteronephrosis. Multiple nonobstructing stones in the left kidney, renal collecting system and extrarenal pelvis. 2. Two or 3 stones in the extrarenal pelvis on the right, apparently not causing obstruction presently. 3. Small bilateral pleural effusions with mild dependent basilar atelectasis. 4. Distended gallbladder without calcified gallstones. 5. Wall thickening of the sigmoid colon without a masslike configuration. This could relate to chronic diverticulosis. Aortic Atherosclerosis (ICD10-I70.0). Electronically Signed   By: Nelson Chimes M.D.   On: 03/03/2018 15:23   Dg Chest Port 1 View  Result Date: 03/03/2018 CLINICAL DATA:  generalized abdominal pain, n/v x 4 days. fever EXAM: PORTABLE CHEST 1 VIEW COMPARISON:  09/12/2015 FINDINGS: Cardiac silhouette is normal in size. No mediastinal or hilar masses. No evidence of adenopathy. Mild linear opacity in the left mid lung consistent with scarring or atelectasis. Lungs otherwise clear. No  pleural effusion or pneumothorax. Skeletal structures are grossly intact. IMPRESSION: No active disease. Electronically Signed   By: Lajean Manes M.D.   On: 03/03/2018 13:54    Procedures .Critical Care Performed by: Lennice Sites, DO Authorized by: Lennice Sites, DO   Critical care  provider statement:    Critical care time (minutes):  40   Critical care time was exclusive of:  Separately billable procedures and treating other patients and teaching time   Critical care was necessary to treat or prevent imminent or life-threatening deterioration of the following conditions:  Sepsis   Critical care was time spent personally by me on the following activities:  Development of treatment plan with patient or surrogate, discussions with primary provider, evaluation of patient's response to treatment, examination of patient, obtaining history from patient or surrogate, ordering and performing treatments and interventions, ordering and review of laboratory studies, ordering and review of radiographic studies, pulse oximetry, re-evaluation of patient's condition and review of old charts   I assumed direction of critical care for this patient from another provider in my specialty: no     (including critical care time)  Medications Ordered in ED Medications  lactated ringers bolus 1,000 mL (0 mLs Intravenous Stopped 03/03/18 1456)  acetaminophen (TYLENOL) tablet 1,000 mg (1,000 mg Oral Given by EMS 03/03/18 1416)  cefTRIAXone (ROCEPHIN) 1 g in sodium chloride 0.9 % 100 mL IVPB (0 g Intravenous Stopped 03/03/18 1456)  0.9 %  sodium chloride infusion ( Intravenous New Bag/Given 03/03/18 1526)     Initial Impression / Assessment and Plan / ED Course  I have reviewed the triage vital signs and the nursing notes.  Pertinent labs & imaging results that were available during my care of the patient were reviewed by me and considered in my medical decision making (see chart for details).     Ana Newman is an 83 year old female with history of Alzheimer's, hypertension, prior urinary tract infections who presents to the ED with fever, tachycardia.  Facility concerned about sepsis.  Patient with fever of 101.7 upon arrival.  Given 1000 mg of Tylenol by EMS.  Patient tachycardic in the low  100s.  EKG shows sinus tachycardia.  Vitals concerning for sepsis.  Patient did have recent ultrasound of her right upper quadrant that showed gallstones but no signs of infection.  She has diffuse abdominal pain on exam.  Otherwise she denies any cough, sputum production.  Overall she is pleasantly demented.  No acute distress.  Blood pressure is normal.  Will initiate sepsis work-up with urine studies, blood cultures, lactic acid.  Will give IV lactated Ringer bolus.  Will obtain CT of the abdomen as well given her tenderness.  Suspect likely urinary source given her history.  Patient with urinalysis consistent with infection.  Has a leukocytosis of 16.  Patient was given IV Rocephin.  Urine culture collected.  Otherwise lactic acid within normal limits.  No other significant electrolyte abnormality.  Creatinine is also elevated.  Likely from sepsis, dehydration.  No significant anemia.  Gallbladder and liver enzymes within normal limits.  Lipase within normal limits.  No concern for acute gallbladder etiology.  CT of the abdomen and pelvis showed left-sided kidney stone about 7 to 8 mm at the left UVJ with marked hydroureteronephrosis.  Patient also with AKI.  Patient septic from infected kidney stone.  Urology consulted and will evaluate the patient in the ED, talked with Dr. Diona Fanti.  Patient to be admitted to hospitalist service for further  care.  Patient admitted to medicine service for further care.  Hemodynamically stable throughout my care.  This chart was dictated using voice recognition software.  Despite best efforts to proofread,  errors can occur which can change the documentation meaning.    Final Clinical Impressions(s) / ED Diagnoses   Final diagnoses:  Sepsis, due to unspecified organism, unspecified whether acute organ dysfunction present [facility])  Acute cystitis without hematuria  Kidney stone    ED Discharge Orders    None       Lennice Sites, DO 03/03/18 [place], Ida, DO 03/03/18 1611

## 2018-03-03 NOTE — ED Notes (Signed)
Patient transported to CT 

## 2018-03-03 NOTE — Op Note (Signed)
Preoperative diagnosis: Left distal ureteral stone with pyelonephrosis/sepsis, right renal pelvic stones  Postoperative diagnoses: Same  Principal procedure: Cystoscopy, bilateral retrograde ureteral pyelograms, fluoroscopic interpretation, placement of bilateral double-J stents- 6 French by 24 cm, without tether's  Surgeon: Shakerra Red  Anesthesia: General with LMA  Complications: None  Specimen: None  Drains: Placement of 41 French Foley catheter  Indications: Elderly female with sepsis/pyelonephrosis secondary to an obstructing left distal ureteral stone.  Urologic consultation was requested once this was found to be present.  She does have right renal pelvic stones as well as his distal ureteral stone.  I have suggested we place bilateral stents prior to eventual treatment, currently for adequate drainage of her left renal system.  I discussed the procedure with the patient's daughter over the phone.  Risks and complications as well as the urgent nature of this procedure were discussed.  The patient's daughter consents.  Description of procedure: The patient was properly identified in the holding area and brought to the operating room.  General anesthetic was administered with the LMA.  She was placed in the dorsolithotomy position.  Genitalia and perineum were prepped and draped.  Proper timeout was performed.  Rigid cystoscopy was performed.  This revealed cloudy urine which precluded full evaluation of the bladder.  Additionally, the patient did have some contractures and I was unable to utilize properly the camera for the cystoscope.  This made a somewhat easy case more difficult.  I identified both ureteral orifice ease which were cannulated separately with a 6 [place] open-ended catheter.  Retrograde study of the left ureter revealed a filling defect distally as well as proximal hydroureteronephrosis with tortuosity of the ureter.  I was unable to fill the renal pelvis Foley, however  there was evident pyelocaliectasis.  I then negotiated the guidewire into the left renal pelvis through the open-ended catheter, and once the open-ended catheter was removed the 6 French 24 cm contour stent was deployed in the ureter with excellent distal and proximal curl seen.  Similar procedure was performed on the right side.  Retrograde revealed a normal-appearing ureter.  Filling defects in the renal pelvis were noted.  No pyelocaliectasis was present.  I then remove the open-ended catheter with the sensor tip guidewire in place.  Again, a 24 cm x 6 [place] contour double-J stent was deployed in the right ureter with excellent proximal and distal curl seen.  Cloudy urine was evident coming from the left ureteral orifice following stent placement.  At this point, the scope was removed.  I then placed a 16 French Foley catheter.  The balloon was filled with water and then hooked to dependent drainage.  The patient tolerated the procedure well.  She was awakened and then taken to the PACU in stable condition.

## 2018-03-03 NOTE — ED Triage Notes (Signed)
Pt to ED from [facility] for generalized abdominal pain, n/v x 4 days. Staff reports lack of appetite and not being able to keep anything down. Abdominal ultrasound done at facility which show gallstones but pt refusing hospitalization until now, fever present. Pt at baseline mental status. 4 mg Zofran, 1000 Tylenol given PTA.

## 2018-03-03 NOTE — Consult Note (Signed)
Urology Consult   Physician requesting consult: Lennice Sites, MD  Reason for consult: Kidney stone, pyelonephrosis  History of Present Illness: Ana Newman is a 83 y.o. female resident of a nursing home with dementia who was admitted with abdominal pain, fever, with recent CT scan done during her emergency room course revealing a 7-1/2 mm left distal ureteral stone with hydronephrosis.  She has obvious urinary tract infection, elevated white blood cell count, possible sepsis.  Urologic consultation is requested.  The patient does have dementia.  She does not have family available.  Full history is unobtainable due to her mental status.  .  Past Medical History:  Diagnosis Date  . Acute pyelonephritis   . Alzheimer's disease ([place])   . Bladder outlet obstruction   . Depression   . Diarrhea   . Environmental and seasonal allergies   . Essential hypertension, benign   . Foley catheter in place   . GERD (gastroesophageal reflux disease)   . History of urinary tract infection   . Hypertension   . Hypothyroidism   . Insomnia   . Nephrolithiasis   . Osteoarthritis   . Reflux esophagitis   . Rickets, active   . Sepsis ([place])   . Unspecified constipation   . Unspecified psychosis   . Unspecified vitamin D deficiency     Past Surgical History:  Procedure Laterality Date  . ABDOMINAL HYSTERECTOMY    . CYSTOSCOPY W/ URETERAL STENT PLACEMENT Bilateral 05/18/2015   Procedure: CYSTOSCOPY WITH RETROGRADE PYELOGRAM/URETERAL STENT PLACEMENT;  Surgeon: Carolan Clines, MD;  Location: [facility];  Service: Urology;  Laterality: Bilateral;  . CYSTOSCOPY W/ URETERAL STENT REMOVAL Bilateral 09/12/2015   Procedure: CYSTOSCOPY WITH STENT REMOVAL;  Surgeon: Carolan Clines, MD;  Location: [facility];  Service: Urology;  Laterality: Bilateral;  1 HOUR  . CYSTOSCOPY WITH RETROGRADE PYELOGRAM, URETEROSCOPY AND STENT PLACEMENT Bilateral 07/18/2015   Procedure: CYSTOSCOPY REMOVE LEFT DOUBLE J STENT LEFT  RETROGRADE PYELOGRAM, LEFT URETEROSCOPY, PYELOSCOPY, REPLACEMENT OF LEFT DOUBLE J STENT;  Surgeon: Carolan Clines, MD;  Location[facility];  Service: Urology;  Laterality: Bilateral;  . HOLMIUM LASER APPLICATION Bilateral 1/88/4166   Procedure: HOLMIUM LASER OF LEFT LOWER URETERAL STONE, LEFT PYELOSCOPY, REPLACE LEFT DOUBLE J STENT REMOVE RIGHT DOUBLE J STENT RIGHT RETROGRADE PYELOGRAM, REPLACEMENT RIGHT DOUBLE J STENT ;  Surgeon: Carolan Clines, MD;  Location: [facility];  Service: Urology;  Laterality: Bilateral;  . KNEE ARTHROSCOPY Left 1997     Current Hospital Medications: Scheduled Meds: . insulin aspart  0-15 Units Subcutaneous TID WC  . insulin aspart  0-5 Units Subcutaneous QHS   Continuous Infusions: . sodium chloride 125 mL/hr at 03/03/18 1711  . [START ON 03/04/2018] cefTRIAXone (ROCEPHIN)  IV     PRN Meds:.acetaminophen **OR** acetaminophen, ketorolac, ondansetron **OR** ondansetron (ZOFRAN) IV  Allergies: No Known Allergies  Family History  Family history unknown: Yes    Social History:  reports that she has never smoked. She has never used smokeless tobacco. She reports that she does not drink alcohol or use drugs.  ROS: Level V caveat  Physical Exam:  Vital signs in last 24 hours: Temp:  [101.6 F (38.7 C)-101.7 F (38.7 C)] 101.7 F (38.7 C) (01/31 1315) Pulse Rate:  [82-117] 82 (01/31 1715) Resp:  [18-38] 22 (01/31 1715) BP: (98-130)/(37-115) 108/52 (01/31 1715) SpO2:  [93 %-97 %] 96 % (01/31 1715) Weight:  [68.5 kg] 68.5 kg (01/31 1311) General: She is arousable.  She does not answer appropriately. HEENT: Normocephalic, atraumatic  Neck: No JVD or lymphadenopathy Cardiovascular: Regular rate  Lungs: Normal inspiratory and expiratory effort Abdomen: Obese, left lower quadrant tenderness noted. Back: Left CVA tenderness Extremities: No edema   Laboratory Data:  Recent Labs    03/03/18 1327  WBC 16.1*  HGB 11.3*  HCT 34.9*  PLT 209    Recent  Labs    03/03/18 1327  NA 133*  K 3.3*  CL 99  GLUCOSE 176*  BUN 73*  CALCIUM 7.8*  CREATININE 2.67*     Results for orders placed or performed during the hospital encounter of 03/03/18 (from the past 24 hour(s))  Lactic acid, plasma     Status: None   Collection Time: 03/03/18  1:27 PM  Result Value Ref Range   Lactic Acid, Venous 1.7 0.5 - 1.9 mmol/L  Comprehensive metabolic panel     Status: Abnormal   Collection Time: 03/03/18  1:27 PM  Result Value Ref Range   Sodium 133 (L) 135 - 145 mmol/L   Potassium 3.3 (L) 3.5 - 5.1 mmol/L   Chloride 99 98 - 111 mmol/L   CO2 15 (L) 22 - 32 mmol/L   Glucose, Bld 176 (H) 70 - 99 mg/dL   BUN 73 (H) 8 - 23 mg/dL   Creatinine, Ser 2.67 (H) 0.44 - 1.00 mg/dL   Calcium 7.8 (L) 8.9 - 10.3 mg/dL   Total Protein 6.6 6.5 - 8.1 g/dL   Albumin 2.4 (L) 3.5 - 5.0 g/dL   AST 20 15 - 41 U/L   ALT 18 0 - 44 U/L   Alkaline Phosphatase 72 38 - 126 U/L   Total Bilirubin 1.2 0.3 - 1.2 mg/dL   GFR calc non Af Amer 16 (L) >60 mL/min   GFR calc Af Amer 19 (L) >60 mL/min   Anion gap 19 (H) 5 - 15  CBC WITH DIFFERENTIAL     Status: Abnormal   Collection Time: 03/03/18  1:27 PM  Result Value Ref Range   WBC 16.1 (H) 4.0 - 10.5 K/uL   RBC 3.59 (L) 3.87 - 5.11 MIL/uL   Hemoglobin 11.3 (L) 12.0 - 15.0 g/dL   HCT 34.9 (L) 36.0 - 46.0 %   MCV 97.2 80.0 - 100.0 fL   MCH 31.5 26.0 - 34.0 pg   MCHC 32.4 30.0 - 36.0 g/dL   RDW 12.9 11.5 - 15.5 %   Platelets 209 150 - 400 K/uL   nRBC 0.0 0.0 - 0.2 %   Neutrophils Relative % 92 %   Neutro Abs 14.8 (H) 1.7 - 7.7 K/uL   Lymphocytes Relative 4 %   Lymphs Abs 0.6 (L) 0.7 - 4.0 K/uL   Monocytes Relative 4 %   Monocytes Absolute 0.6 0.1 - 1.0 K/uL   Eosinophils Relative 0 %   Eosinophils Absolute 0.0 0.0 - 0.5 K/uL   Basophils Relative 0 %   Basophils Absolute 0.0 0.0 - 0.1 K/uL   nRBC 0 0 /100 WBC   Abs Immature Granulocytes 0.00 0.00 - 0.07 K/uL   Burr Cells PRESENT   Lipase, blood     Status: None    Collection Time: 03/03/18  1:27 PM  Result Value Ref Range   Lipase 23 11 - 51 U/L  Urinalysis, Routine w reflex microscopic     Status: Abnormal   Collection Time: 03/03/18  1:28 PM  Result Value Ref Range   Color, Urine YELLOW YELLOW   APPearance TURBID (A) CLEAR   Specific Gravity, Urine 1.017 1.005 -  1.030   pH 7.0 5.0 - 8.0   Glucose, UA NEGATIVE NEGATIVE mg/dL   Hgb urine dipstick LARGE (A) NEGATIVE   Bilirubin Urine NEGATIVE NEGATIVE   Ketones, ur 20 (A) NEGATIVE mg/dL   Protein, ur 100 (A) NEGATIVE mg/dL   Nitrite NEGATIVE NEGATIVE   Leukocytes, UA MODERATE (A) NEGATIVE   RBC / HPF >50 (H) 0 - 5 RBC/hpf   WBC, UA >50 (H) 0 - 5 WBC/hpf   Bacteria, UA MANY (A) NONE SEEN   Squamous Epithelial / LPF >50 (H) 0 - 5   Mucus PRESENT    Amorphous Crystal PRESENT    Non Squamous Epithelial 21-50 (A) NONE SEEN  Influenza panel by PCR (type A & B)     Status: None   Collection Time: 03/03/18  2:26 PM  Result Value Ref Range   Influenza A By PCR NEGATIVE NEGATIVE   Influenza B By PCR NEGATIVE NEGATIVE  Lactic acid, plasma     Status: None   Collection Time: 03/03/18  3:27 PM  Result Value Ref Range   Lactic Acid, Venous 1.0 0.5 - 1.9 mmol/L   No results found for this or any previous visit (from the past 240 hour(s)).  Renal Function: Recent Labs    03/03/18 1327  CREATININE 2.67*   Estimated Creatinine Clearance: 15.3 mL/min (A) (by C-G formula based on SCr of 2.67 mg/dL (H)).  Radiologic Imaging: Ct Abdomen Pelvis Wo Contrast  Result Date: 03/03/2018 CLINICAL DATA:  Abdominal distension. EXAM: CT ABDOMEN AND PELVIS WITHOUT CONTRAST TECHNIQUE: Multidetector CT imaging of the abdomen and pelvis was performed following the standard protocol without IV contrast. COMPARISON:  05/27/2015 FINDINGS: Lower chest: Small dependent pleural effusions with mild dependent basilar atelectasis. Lung bases are largely clear. Hepatobiliary: Gallbladder is distended. No calcified gallstones.  No liver parenchymal abnormality seen. Pancreas: Negative Spleen: Negative Adrenals/Urinary Tract: No adrenal mass. No stones in the right kidney is self. There are 2 or 3 stones in the extrarenal pelvis on the right, apparently not causing obstruction presently. The remainder of the right ureter is normal. The left kidney contains multiple nonobstructing stones. There are a few stones in the renal collecting system and renal pelvis. The ureter is markedly dilated all the way to the bladder where there is a UVJ stone measuring 7 8 mm in size. No stone in the bladder. Small bladder diverticulum on the left. Stomach/Bowel: No acute abnormal bowel finding. No evidence of ileus or obstruction. No inflammatory change seen. Some wall thickening in the sigmoid region could be due to mild chronic diverticulosis. Vascular/Lymphatic: Aortic atherosclerosis. No aneurysm. IVC is normal. No retroperitoneal adenopathy. Reproductive: Previous hysterectomy. No pelvic mass. Other: No free fluid or air. Musculoskeletal: Previous lumbosacral fusion. No acute bone finding. IMPRESSION: 1. 7-8 mm stone at the left UVJ with marked left hydroureteronephrosis. Multiple nonobstructing stones in the left kidney, renal collecting system and extrarenal pelvis. 2. Two or 3 stones in the extrarenal pelvis on the right, apparently not causing obstruction presently. 3. Small bilateral pleural effusions with mild dependent basilar atelectasis. 4. Distended gallbladder without calcified gallstones. 5. Wall thickening of the sigmoid colon without a masslike configuration. This could relate to chronic diverticulosis. Aortic Atherosclerosis (ICD10-I70.0). Electronically Signed   By: Nelson Chimes M.D.   On: 03/03/2018 15:23   Dg Chest Port 1 View  Result Date: 03/03/2018 CLINICAL DATA:  generalized abdominal pain, n/v x 4 days. fever EXAM: PORTABLE CHEST 1 VIEW COMPARISON:  09/12/2015 FINDINGS: Cardiac silhouette  is normal in size. No mediastinal or  hilar masses. No evidence of adenopathy. Mild linear opacity in the left mid lung consistent with scarring or atelectasis. Lungs otherwise clear. No pleural effusion or pneumothorax. Skeletal structures are grossly intact. IMPRESSION: No active disease. Electronically Signed   By: Lajean Manes M.D.   On: 03/03/2018 13:54    I independently reviewed the above imaging studies.  Impression/Assessment:  Left ureteral stone, distal, with proximal hydroureteronephrosis with multiple left renal stones, also with right renal pelvic calculi.  Plan:  I would recommend that we proceed with cystoscopy, bilateral retrogrades, bilateral double-J stent placements.  She does not have right hydronephrosis from these renal pelvic stones, but is at risk for this, and at this point, I would recommend bilateral stenting with eventual bilateral stone management.

## 2018-03-03 NOTE — Progress Notes (Signed)
Pt in remains in PACU due to c/o of nausea after nurse endorsed report to 5W RN-pt attempting to vomit, post op shivers, increased anxiety - Dr Marcie Bal called at 2038 for shivers, increased HR, anxiety, increased RRnausea, monitor unable to process EKG due to shivers, EKG electrodes repositioned, unable to do 12 lead EKG due to shivers- No relief w/ nausea w/ 6.25mg  Phenergan IV, HR remains elevated, irregular, Dr Marcie Bal called back at 2100- esmolol ordered, given at 2110 IV. Dr Marcie Bal to bedside at 2112 - versed ordered - no history of Afib.Will attempt 12 lead EKG once pt is shivering less. 2135-Dr Hodierne to bedside -no changes in BP or HR- slightly calmer after Versed. Dr Terrence Dupont (oncall cardiologist paged) -call back at 2145 to Dr Marcie Bal. Plan is to do a cardizem bolus with infusion -see orders. Pt's daughter at bedside -updated at 2142, 5W updated. Call to Dr Dorina Hoyer by Chip, RN to update on pt status - updated on CBG & repeat CBG(95), VS- temp 99 at 2149. Awaiting cardizem gtt. [facility] paged at 2159.

## 2018-03-03 NOTE — Consult Note (Signed)
Reason for Consult: A. fib with RVR Referring Physician: [facility]  Ana Newman is an 83 y.o. female.  HPI: Patient is 83 year old female with past medical history significant for hypertension, type 2 diabetes mellitus, hyperlipidemia, history of nephrolithiasis requiring cystoscopy and stent placement and removal in the past mild vascular dementia, nursing home resident mostly bedbound was admitted because of nausea vomiting and fever and was noted to have pyelonephritis and left UV junction stone with mild left hydronephrosis and right pelvic stone patient subsequently underwent urological procedure and placement of stents without complications while in PACU became very shaky and confused and was noted to have questionable atrial fibrillation with rapid ventricular response with baseline artifact patient received IV Cardizem bolus and was started on drip EKG done showed marked sinus tachycardia.  Upon further reviewing the rhythm strips it appears to be marked sinus tachycardia with baseline artifacts.  Patient presently denies any chest pain or shortness of breath denies palpitations.  Past Medical History:  Diagnosis Date  . Acute pyelonephritis   . Alzheimer's disease ([place])   . Bladder outlet obstruction   . Depression   . Diarrhea   . Environmental and seasonal allergies   . Essential hypertension, benign   . Foley catheter in place   . GERD (gastroesophageal reflux disease)   . History of urinary tract infection   . Hypertension   . Hypothyroidism   . Insomnia   . Nephrolithiasis   . Osteoarthritis   . Reflux esophagitis   . Rickets, active   . Sepsis ([place])   . Unspecified constipation   . Unspecified psychosis   . Unspecified vitamin D deficiency     Past Surgical History:  Procedure Laterality Date  . ABDOMINAL HYSTERECTOMY    . CYSTOSCOPY W/ URETERAL STENT PLACEMENT Bilateral 05/18/2015   Procedure: CYSTOSCOPY WITH RETROGRADE PYELOGRAM/URETERAL STENT  PLACEMENT;  Surgeon: Carolan Clines, MD;  [facility];  Service: Urology;  Laterality: Bilateral;  . CYSTOSCOPY W/ URETERAL STENT REMOVAL Bilateral 09/12/2015   Procedure: CYSTOSCOPY WITH STENT REMOVAL;  Surgeon: Carolan Clines, MD;  [facility];  Service: Urology;  Laterality: Bilateral;  1 HOUR  . CYSTOSCOPY WITH RETROGRADE PYELOGRAM, URETEROSCOPY AND STENT PLACEMENT Bilateral 07/18/2015   Procedure: CYSTOSCOPY REMOVE LEFT DOUBLE J STENT LEFT RETROGRADE PYELOGRAM, LEFT URETEROSCOPY, PYELOSCOPY, REPLACEMENT OF LEFT DOUBLE J STENT;  Surgeon: Carolan Clines, MD;  [facility];  Service: Urology;  Laterality: Bilateral;  . HOLMIUM LASER APPLICATION Bilateral 1/61/0960   Procedure: HOLMIUM LASER OF LEFT LOWER URETERAL STONE, LEFT PYELOSCOPY, REPLACE LEFT DOUBLE J STENT REMOVE RIGHT DOUBLE J STENT RIGHT RETROGRADE PYELOGRAM, REPLACEMENT RIGHT DOUBLE J STENT ;  Surgeon: Carolan Clines, MD;  Location: [facility];  Service: Urology;  Laterality: Bilateral;  . KNEE ARTHROSCOPY Left 1997    Family History  Family history unknown: Yes    Social History:  reports that she has never smoked. She has never used smokeless tobacco. She reports that she does not drink alcohol or use drugs.  Allergies: No Known Allergies  Medications: I have reviewed the patient's current medications.  Results for orders placed or performed during the hospital encounter of 03/03/18 (from the past 48 hour(s))  Lactic acid, plasma     Status: None   Collection Time: 03/03/18  1:27 PM  Result Value Ref Range   Lactic Acid, Venous 1.7 0.5 - 1.9 mmol/L    Comment: Performed at [facility], [place] [address], [place], [place] [address]  Comprehensive metabolic  panel     Status: Abnormal   Collection Time: 03/03/18  1:27 PM  Result Value Ref Range   Sodium 133 (L) 135 - 145 mmol/L   Potassium 3.3 (L) 3.5 - 5.1 mmol/L   Chloride 99 98 - 111 mmol/L   CO2 15 (L) 22 - 32 mmol/L   Glucose, Bld 176 (H)  70 - 99 mg/dL   BUN 73 (H) 8 - 23 mg/dL   Creatinine, Ser 2.67 (H) 0.44 - 1.00 mg/dL   Calcium 7.8 (L) 8.9 - 10.3 mg/dL   Total Protein 6.6 6.5 - 8.1 g/dL   Albumin 2.4 (L) 3.5 - 5.0 g/dL   AST 20 15 - 41 U/L   ALT 18 0 - 44 U/L   Alkaline Phosphatase 72 38 - 126 U/L   Total Bilirubin 1.2 0.3 - 1.2 mg/dL   GFR calc non Af Amer 16 (L) >60 mL/min   GFR calc Af Amer 19 (L) >60 mL/min   Anion gap 19 (H) 5 - 15    Comment: Performed at [facility], [place] [address] [place], [place] [address]  CBC WITH DIFFERENTIAL     Status: Abnormal   Collection Time: 03/03/18  1:27 PM  Result Value Ref Range   WBC 16.1 (H) 4.0 - 10.5 K/uL   RBC 3.59 (L) 3.87 - 5.11 MIL/uL   Hemoglobin 11.3 (L) 12.0 - 15.0 g/dL   HCT 34.9 (L) 36.0 - 46.0 %   MCV 97.2 80.0 - 100.0 fL   MCH 31.5 26.0 - 34.0 pg   MCHC 32.4 30.0 - 36.0 g/dL   RDW 12.9 11.5 - 15.5 %   Platelets 209 150 - 400 K/uL   nRBC 0.0 0.0 - 0.2 %   Neutrophils Relative % 92 %   Neutro Abs 14.8 (H) 1.7 - 7.7 K/uL   Lymphocytes Relative 4 %   Lymphs Abs 0.6 (L) 0.7 - 4.0 K/uL   Monocytes Relative 4 %   Monocytes Absolute 0.6 0.1 - 1.0 K/uL   Eosinophils Relative 0 %   Eosinophils Absolute 0.0 0.0 - 0.5 K/uL   Basophils Relative 0 %   Basophils Absolute 0.0 0.0 - 0.1 K/uL   nRBC 0 0 /100 WBC   Abs Immature Granulocytes 0.00 0.00 - 0.07 K/uL   Burr Cells PRESENT     Comment: Performed at [facility], [place] [address] [place], [place] [address]  Lipase, blood     Status: None   Collection Time: 03/03/18  1:27 PM  Result Value Ref Range   Lipase 23 11 - 51 U/L    Comment: Performed at [facility] [address], [place], [place] [address]  Urinalysis, Routine w reflex microscopic     Status: Abnormal   Collection Time: 03/03/18  1:28 PM  Result Value Ref Range   Color, Urine YELLOW YELLOW   APPearance TURBID (A) CLEAR   Specific Gravity, Urine 1.017 1.005 - 1.030   pH 7.0 5.0 - 8.0   Glucose, UA NEGATIVE NEGATIVE mg/dL    Hgb urine dipstick LARGE (A) NEGATIVE   Bilirubin Urine NEGATIVE NEGATIVE   Ketones, ur 20 (A) NEGATIVE mg/dL   Protein, ur 100 (A) NEGATIVE mg/dL   Nitrite NEGATIVE NEGATIVE   Leukocytes, UA MODERATE (A) NEGATIVE   RBC / HPF >50 (H) 0 - 5 RBC/hpf   WBC, UA >50 (H) 0 - 5 WBC/hpf   Bacteria, UA MANY (A) NONE SEEN   Squamous  Epithelial / LPF >50 (H) 0 - 5   Mucus PRESENT    Amorphous Crystal PRESENT    Non Squamous Epithelial 21-50 (A) NONE SEEN    Comment: Performed at [facility], [place] [address] [place], [place] [address]  Influenza panel by PCR (type A & B)     Status: None   Collection Time: 03/03/18  2:26 PM  Result Value Ref Range   Influenza A By PCR NEGATIVE NEGATIVE   Influenza B By PCR NEGATIVE NEGATIVE    Comment: (NOTE) The Xpert Xpress Flu assay is intended as an aid in the diagnosis of  influenza and should not be used as a sole basis for treatment.  This  assay is FDA approved for nasopharyngeal swab specimens only. Nasal  washings and aspirates are unacceptable for Xpert Xpress Flu testing. Performed at [facility], [place] [address] [place], [place] [address]   Lactic acid, plasma     Status: None   Collection Time: 03/03/18  3:27 PM  Result Value Ref Range   Lactic Acid, Venous 1.0 0.5 - 1.9 mmol/L    Comment: Performed at [facility] [address], [place], [place] [address]  Glucose, capillary     Status: Abnormal   Collection Time: 03/03/18  8:07 PM  Result Value Ref Range   Glucose-Capillary 115 (H) 70 - 99 mg/dL  Glucose, capillary     Status: None   Collection Time: 03/03/18  9:24 PM  Result Value Ref Range   Glucose-Capillary 95 70 - 99 mg/dL    Ct Abdomen Pelvis Wo Contrast  Result Date: 03/03/2018 CLINICAL DATA:  Abdominal distension. EXAM: CT ABDOMEN AND PELVIS WITHOUT CONTRAST TECHNIQUE: Multidetector CT imaging of the abdomen and pelvis was performed following the standard protocol without IV contrast. COMPARISON:   05/27/2015 FINDINGS: Lower chest: Small dependent pleural effusions with mild dependent basilar atelectasis. Lung bases are largely clear. Hepatobiliary: Gallbladder is distended. No calcified gallstones. No liver parenchymal abnormality seen. Pancreas: Negative Spleen: Negative Adrenals/Urinary Tract: No adrenal mass. No stones in the right kidney is self. There are 2 or 3 stones in the extrarenal pelvis on the right, apparently not causing obstruction presently. The remainder of the right ureter is normal. The left kidney contains multiple nonobstructing stones. There are a few stones in the renal collecting system and renal pelvis. The ureter is markedly dilated all the way to the bladder where there is a UVJ stone measuring 7 8 mm in size. No stone in the bladder. Small bladder diverticulum on the left. Stomach/Bowel: No acute abnormal bowel finding. No evidence of ileus or obstruction. No inflammatory change seen. Some wall thickening in the sigmoid region could be due to mild chronic diverticulosis. Vascular/Lymphatic: Aortic atherosclerosis. No aneurysm. IVC is normal. No retroperitoneal adenopathy. Reproductive: Previous hysterectomy. No pelvic mass. Other: No free fluid or air. Musculoskeletal: Previous lumbosacral fusion. No acute bone finding. IMPRESSION: 1. 7-8 mm stone at the left UVJ with marked left hydroureteronephrosis. Multiple nonobstructing stones in the left kidney, renal collecting system and extrarenal pelvis. 2. Two or 3 stones in the extrarenal pelvis on the right, apparently not causing obstruction presently. 3. Small bilateral pleural effusions with mild dependent basilar atelectasis. 4. Distended gallbladder without calcified gallstones. 5. Wall thickening of the sigmoid colon without a masslike configuration. This could relate to chronic diverticulosis. Aortic Atherosclerosis (ICD10-I70.0). Electronically Signed   By: Nelson Chimes M.D.   On: 03/03/2018 15:23   Dg Chest  Port 1  View  Result Date: 03/03/2018 CLINICAL DATA:  generalized abdominal pain, n/v x 4 days. fever EXAM: PORTABLE CHEST 1 VIEW COMPARISON:  09/12/2015 FINDINGS: Cardiac silhouette is normal in size. No mediastinal or hilar masses. No evidence of adenopathy. Mild linear opacity in the left mid lung consistent with scarring or atelectasis. Lungs otherwise clear. No pleural effusion or pneumothorax. Skeletal structures are grossly intact. IMPRESSION: No active disease. Electronically Signed   By: Lajean Manes M.D.   On: 03/03/2018 13:54    Review of Systems  Unable to perform ROS: Acuity of condition   Blood pressure (!) 95/46, pulse (!) 125, temperature 97.6 F (36.4 C), resp. rate (!) 28, height 5\' 2"  (1.575 m), weight 68.5 kg, SpO2 95 %. Physical Exam  HENT:  Head: Normocephalic and atraumatic.  Eyes: Conjunctivae are normal. Left eye exhibits no discharge. No scleral icterus.  Neck: Normal range of motion. Neck supple. No JVD present. No tracheal deviation present. No thyromegaly present.  Cardiovascular:  Tachycardic S1-S2 normal there is 2/6 systolic murmur noted  Respiratory: Effort normal and breath sounds normal. No respiratory distress. She has no wheezes.  GI: Soft. Bowel sounds are normal. She exhibits no distension. There is no abdominal tenderness.  Musculoskeletal:        General: No tenderness, deformity or edema.  Neurological: She is alert.  Awake responds to verbal commands     Assessment/Plan: Status post questionable A. fib with RVR possible baseline artifacts Acute pyelonephritis with mild left hydronephrosis secondary to UV junction left stone status post bilateral JJ stenting Right nephrolithiasis Hypertension Diabetes mellitus Hyperlipidemia Mild vascular dementia History of nephrolithiasis in the past Acute renal injury Dehydration Plan Continue present management we will switch IV Cardizem to p.o. in a.m. No further cardiac work-up at this point unless she  had episodes of A. fib Check TSH in a.m. Antibiotics and hydration as per primary team Avoid NSAIDs in view of acute kidney injury  Charolette Forward 03/03/2018, 10:59 PM

## 2018-03-03 NOTE — H&P (Signed)
History and Physical    Ana Newman DOB: 1935/09/18 DOA: 03/03/2018  PCP: Patient, No Pcp Per   Patient coming from: SNF  I have personally briefly reviewed patient's old medical records in [place]  Chief Complaint: Fever  HPI: Ana Newman is a 83 y.o. female with medical history significant of dementia, diabetes mellitus type 2, hypertension, hyperlipidemia, renal/ureteral stones requiring cystoscopy and stent placement and removal in the past, nursing home resident was sent from nursing home for evaluation of fever.  Patient has dementia and cannot provide appropriate history.  I have reviewed patient's prior medical records and ED providers documentation myself.  Apparently patient has been having nausea and vomiting with fever.  No further history could be obtained.  ED Course: She was found to be febrile, tachycardic with leukocytosis and urinalysis consistent with UTI and CT of the abdomen and pelvis showing left UVJ stone with mild left hydroureteronephrosis.  She was given IV fluids and antibiotics.  Urology has been called by the ER.  Review of Systems: Could not be obtained because of patient's dementia. Past Medical History:  Diagnosis Date  . Acute pyelonephritis   . Alzheimer's disease ([place])   . Bladder outlet obstruction   . Depression   . Diarrhea   . Environmental and seasonal allergies   . Essential hypertension, benign   . Foley catheter in place   . GERD (gastroesophageal reflux disease)   . History of urinary tract infection   . Hypertension   . Hypothyroidism   . Insomnia   . Nephrolithiasis   . Osteoarthritis   . Reflux esophagitis   . Rickets, active   . Sepsis ([place])   . Unspecified constipation   . Unspecified psychosis   . Unspecified vitamin D deficiency     Past Surgical History:  Procedure Laterality Date  . ABDOMINAL HYSTERECTOMY    . CYSTOSCOPY W/ URETERAL STENT PLACEMENT Bilateral 05/18/2015   Procedure:  CYSTOSCOPY WITH RETROGRADE PYELOGRAM/URETERAL STENT PLACEMENT;  Surgeon: Carolan Clines, MD;  Location: [facility];  Service: Urology;  Laterality: Bilateral;  . CYSTOSCOPY W/ URETERAL STENT REMOVAL Bilateral 09/12/2015   Procedure: CYSTOSCOPY WITH STENT REMOVAL;  Surgeon: Carolan Clines, MD;  Location: [facility];  Service: Urology;  Laterality: Bilateral;  1 HOUR  . CYSTOSCOPY WITH RETROGRADE PYELOGRAM, URETEROSCOPY AND STENT PLACEMENT Bilateral 07/18/2015   Procedure: CYSTOSCOPY REMOVE LEFT DOUBLE J STENT LEFT RETROGRADE PYELOGRAM, LEFT URETEROSCOPY, PYELOSCOPY, REPLACEMENT OF LEFT DOUBLE J STENT;  Surgeon: Carolan Clines, MD;  Location: [facility];  Service: Urology;  Laterality: Bilateral;  . HOLMIUM LASER APPLICATION Bilateral 10/02/5174   Procedure: HOLMIUM LASER OF LEFT LOWER URETERAL STONE, LEFT PYELOSCOPY, REPLACE LEFT DOUBLE J STENT REMOVE RIGHT DOUBLE J STENT RIGHT RETROGRADE PYELOGRAM, REPLACEMENT RIGHT DOUBLE J STENT ;  Surgeon: Carolan Clines, MD;  Location: [facility];  Service: Urology;  Laterality: Bilateral;  . KNEE ARTHROSCOPY Left 1997   Social history  reports that she has never smoked. She has never used smokeless tobacco. She reports that she does not drink alcohol or use drugs.  -Currently lives in a nursing home  No Known Allergies  Family History  Family history unknown: Yes    Prior to Admission medications   Medication Sig Start Date End Date Taking? Authorizing Provider  acetaminophen (TYLENOL) 325 MG tablet Take 650 mg by mouth every 6 (six) hours as needed for moderate pain.   Yes [provider]  acetaminophen (TYLENOL) 500 MG tablet Take 1,000 mg by  mouth daily. Pain management   Yes [provider]  bisacodyl (DULCOLAX) 10 MG suppository Place 10 mg rectally as needed for moderate constipation.   Yes [provider]  cholecalciferol (VITAMIN D) 1000 UNITS tablet Take 2,000 Units by mouth daily.   Yes [provider]    docusate sodium (COLACE) 100 MG capsule Take 100 mg by mouth 2 (two) times daily.   Yes [provider]  linaclotide (LINZESS) 72 MCG capsule Take 72 mcg by mouth daily.   Yes [provider]  lisinopril (PRINIVIL,ZESTRIL) 5 MG tablet Take 5 mg by mouth daily.    Yes [provider]  metoprolol tartrate (LOPRESSOR) 25 MG tablet Take 25 mg by mouth 2 (two) times daily. For HTN  Hold if B/P < 100/60 or HR < 60   Yes [provider]  Multiple Vitamin (MULTIVITAMIN) tablet Take 1 tablet by mouth daily. Wound healing 04/12/17  Yes [provider]  ondansetron (ZOFRAN) 4 MG tablet Take 4 mg by mouth every 6 (six) hours as needed for nausea or vomiting.   Yes [provider]  oxyCODONE-acetaminophen (PERCOCET/ROXICET) 5-325 MG tablet Take 1 tablet by mouth every 6 (six) hours. 11/02/17  Yes Gerlene Fee, NP  polyethylene glycol (MIRALAX / GLYCOLAX) packet Take 17 g by mouth every 12 (twelve) hours as needed for mild constipation.   Yes [provider]  Polyvinyl Alcohol-Povidone (FRESHKOTE) 2.7-2 % SOLN Apply 1 drop to eye 3 (three) times daily. To both eyes   Yes [provider]  aspirin 325 MG tablet Take 325 mg by mouth daily.    [provider]  atorvastatin (LIPITOR) 10 MG tablet Take 10 mg by mouth daily at 6 PM.    [provider]  Insulin Detemir (LEVEMIR FLEXPEN) 100 UNIT/ML Pen Inject 12 Units into the skin every morning. And 12 units subcutaneously in the evening    [provider]  nitrofurantoin, macrocrystal-monohydrate, (MACROBID) 100 MG capsule Take 1 capsule (100 mg total) by mouth at bedtime. 09/12/15   Carolan Clines, MD  Nutritional Supplements (NUTRITIONAL SUPPLEMENT PO) Regular Diet - Regular texture, Regular consistency    [provider]  nystatin (NYSTATIN) powder Apply to bilateral breast topically two times daily for rash 08/16/17   [provider]  Omega-3  Fatty Acids (FISH OIL OMEGA-3 PO) Give 1 capsule by mouth every morning    [provider]  triamcinolone (KENALOG) 0.025 % cream Apply to bilateral feet topically two times daily for excessive dry skin 06/16/17   [provider]    Physical Exam: Vitals:   03/03/18 1415 03/03/18 1445 03/03/18 1500 03/03/18 1515  BP: (!) 98/37 (!) 113/52 (!) 102/42 (!) 120/50  Pulse: 93 99 96 94  Resp: (!) 25 (!) 27 (!) 22 (!) 31  Temp:      TempSrc:      SpO2: 94% 95% 95% 95%  Weight:      Height:        Constitutional: Elderly female, lying in bed, sleepy, wakes up on calling her name.  Confused.  No distress. Vitals:   03/03/18 1415 03/03/18 1445 03/03/18 1500 03/03/18 1515  BP: (!) 98/37 (!) 113/52 (!) 102/42 (!) 120/50  Pulse: 93 99 96 94  Resp: (!) 25 (!) 27 (!) 22 (!) 31  Temp:      TempSrc:      SpO2: 94% 95% 95% 95%  Weight:      Height:  Eyes: PERRL, lids and conjunctivae normal ENMT: Mucous membranes are dry.  Posterior pharynx clear of any exudate or lesions. Neck: normal, supple, no masses, no thyromegaly Respiratory: bilateral decreased breath sounds at bases, no wheezing, no crackles.  Tachypneic. No accessory muscle use.  Cardiovascular: S1 S2 positive, rate controlled. No extremity edema. 2+ pedal pulses.  Abdomen: Lower quadrant tenderness present, no masses palpated. No hepatosplenomegaly. Bowel sounds positive.  Musculoskeletal: no clubbing / cyanosis. No joint deformity upper and lower extremities.  Skin: no rashes, lesions, ulcers. No induration Neurologic: CN 2-12 grossly intact. Moving extremities. No focal neurologic deficits.  Psychiatric: Could not be evaluated.  Patient is confused   Labs on Admission: I have personally reviewed following labs and imaging studies  CBC: Recent Labs  Lab 03/03/18 1327  WBC 16.1*  NEUTROABS 14.8*  HGB 11.3*  HCT 34.9*  MCV 97.2  PLT 938   Basic Metabolic Panel: Recent Labs  Lab 03/03/18 1327  NA  133*  K 3.3*  CL 99  CO2 15*  GLUCOSE 176*  BUN 73*  CREATININE 2.67*  CALCIUM 7.8*   GFR: Estimated Creatinine Clearance: 15.3 mL/min (A) (by C-G formula based on SCr of 2.67 mg/dL (H)). Liver Function Tests: Recent Labs  Lab 03/03/18 1327  AST 20  ALT 18  ALKPHOS 72  BILITOT 1.2  PROT 6.6  ALBUMIN 2.4*   Recent Labs  Lab 03/03/18 1327  LIPASE 23   No results for input(s): AMMONIA in the last 168 hours. Coagulation Profile: No results for input(s): INR, PROTIME in the last 168 hours. Cardiac Enzymes: No results for input(s): CKTOTAL, CKMB, CKMBINDEX, TROPONINI in the last 168 hours. BNP (last 3 results) No results for input(s): PROBNP in the last 8760 hours. HbA1C: No results for input(s): HGBA1C in the last 72 hours. CBG: No results for input(s): GLUCAP in the last 168 hours. Lipid Profile: No results for input(s): CHOL, HDL, LDLCALC, TRIG, CHOLHDL, LDLDIRECT in the last 72 hours. Thyroid Function Tests: No results for input(s): TSH, T4TOTAL, FREET4, T3FREE, THYROIDAB in the last 72 hours. Anemia Panel: No results for input(s): VITAMINB12, FOLATE, FERRITIN, TIBC, IRON, RETICCTPCT in the last 72 hours. Urine analysis:    Component Value Date/Time   COLORURINE YELLOW 03/03/2018 1328   APPEARANCEUR TURBID (A) 03/03/2018 1328   LABSPEC 1.017 03/03/2018 1328   PHURINE 7.0 03/03/2018 1328   GLUCOSEU NEGATIVE 03/03/2018 1328   HGBUR LARGE (A) 03/03/2018 1328   BILIRUBINUR NEGATIVE 03/03/2018 1328   KETONESUR 20 (A) 03/03/2018 1328   PROTEINUR 100 (A) 03/03/2018 1328   NITRITE NEGATIVE 03/03/2018 1328   LEUKOCYTESUR MODERATE (A) 03/03/2018 1328    Radiological Exams on Admission: Ct Abdomen Pelvis Wo Contrast  Result Date: 03/03/2018 CLINICAL DATA:  Abdominal distension. EXAM: CT ABDOMEN AND PELVIS WITHOUT CONTRAST TECHNIQUE: Multidetector CT imaging of the abdomen and pelvis was performed following the standard protocol without IV contrast. COMPARISON:   05/27/2015 FINDINGS: Lower chest: Small dependent pleural effusions with mild dependent basilar atelectasis. Lung bases are largely clear. Hepatobiliary: Gallbladder is distended. No calcified gallstones. No liver parenchymal abnormality seen. Pancreas: Negative Spleen: Negative Adrenals/Urinary Tract: No adrenal mass. No stones in the right kidney is self. There are 2 or 3 stones in the extrarenal pelvis on the right, apparently not causing obstruction presently. The remainder of the right ureter is normal. The left kidney contains multiple nonobstructing stones. There are a few stones in the renal collecting system and renal pelvis. The ureter is markedly dilated all  the way to the bladder where there is a UVJ stone measuring 7 8 mm in size. No stone in the bladder. Small bladder diverticulum on the left. Stomach/Bowel: No acute abnormal bowel finding. No evidence of ileus or obstruction. No inflammatory change seen. Some wall thickening in the sigmoid region could be due to mild chronic diverticulosis. Vascular/Lymphatic: Aortic atherosclerosis. No aneurysm. IVC is normal. No retroperitoneal adenopathy. Reproductive: Previous hysterectomy. No pelvic mass. Other: No free fluid or air. Musculoskeletal: Previous lumbosacral fusion. No acute bone finding. IMPRESSION: 1. 7-8 mm stone at the left UVJ with marked left hydroureteronephrosis. Multiple nonobstructing stones in the left kidney, renal collecting system and extrarenal pelvis. 2. Two or 3 stones in the extrarenal pelvis on the right, apparently not causing obstruction presently. 3. Small bilateral pleural effusions with mild dependent basilar atelectasis. 4. Distended gallbladder without calcified gallstones. 5. Wall thickening of the sigmoid colon without a masslike configuration. This could relate to chronic diverticulosis. Aortic Atherosclerosis (ICD10-I70.0). Electronically Signed   By: Nelson Chimes M.D.   On: 03/03/2018 15:23   Dg Chest Port 1  View  Result Date: 03/03/2018 CLINICAL DATA:  generalized abdominal pain, n/v x 4 days. fever EXAM: PORTABLE CHEST 1 VIEW COMPARISON:  09/12/2015 FINDINGS: Cardiac silhouette is normal in size. No mediastinal or hilar masses. No evidence of adenopathy. Mild linear opacity in the left mid lung consistent with scarring or atelectasis. Lungs otherwise clear. No pleural effusion or pneumothorax. Skeletal structures are grossly intact. IMPRESSION: No active disease. Electronically Signed   By: Lajean Manes M.D.   On: 03/03/2018 13:54      Assessment/Plan Principal Problem:   Pyelonephritis Active Problems:   Hypothyroidism   Dyslipidemia associated with type 2 diabetes mellitus (HCC)   Diabetes mellitus, type II, insulin dependent ([place])   Sepsis ([place])   AKI (acute kidney injury) ([place])   Leukocytosis   Ureterolithiasis    Sepsis secondary to acute pyelonephritis -Continue antibiotics as below.  Patient has been given normal saline bolus in the ED.  Continue normal saline at 125 cc an hour.  Follow cultures.  Chest x-ray negative for infiltrates.  Acute left-sided pyelonephritis with obstructing left UVJ stone causing left hydroureteronephrosis -Continue Rocephin.  Follow cultures.  Keep n.p.o. for now.  Urology has been consulted from the ED.  Follow urology recommendations  Leukocytosis -Secondary to above.  Monitor  Acute kidney injury -Most likely prerenal from above.  IV fluids as above.  Repeat a.m. labs  Alzheimer's dementia without behavioral disturbance -Monitor mental status.  Fall precautions.  Diabetes mellitus type 2, insulin-dependent -CBGs with SSI.  Hypertension  -Blood pressure on the lower side.  Monitor.  Hold antihypertensives for now  Dyslipidemia  -continue Lipitor  DVT prophylaxis: SCDs.  Hold heparin pending urological intervention Code Status: DNR as per nursing home records Family Communication: None at bedside Disposition Plan: Probable back to  nursing home in 1 to 3 days pending clinical improvement Consults called: Urology was called by the ED provider Admission status: MedSurg/inpatient  Severity of Illness: The appropriate patient status for this patient is INPATIENT. Inpatient status is judged to be reasonable and necessary in order to provide the required intensity of service to ensure the patient's safety. The patient's presenting symptoms, physical exam findings, and initial radiographic and laboratory data in the context of their chronic comorbidities is felt to place them at high risk for further clinical deterioration. Furthermore, it is not anticipated that the patient will be medically stable for discharge  from the hospital within 2 midnights of admission. The following factors support the patient status of inpatient.   " The patient's presenting symptoms include fever. " The worrisome physical exam findings include fever/tachycardia/abdominal tenderness. " The initial radiographic and laboratory data are worrisome because of leukocytosis/acute kidney injury/left ureterolithiasis with hydronephrosis. " The chronic co-morbidities include dementia/diabetes mellitus/hypertension.   * I certify that at the point of admission it is my clinical judgment that the patient will require inpatient hospital care spanning beyond 2 midnights from the point of admission due to high intensity of service, high risk for further deterioration and high frequency of surveillance required.[name] MD [facility]    03/03/2018, 4:06 PM

## 2018-03-04 DIAGNOSIS — R7881 Bacteremia: Secondary | ICD-10-CM

## 2018-03-04 LAB — BLOOD CULTURE ID PANEL (REFLEXED)
Acinetobacter baumannii: NOT DETECTED
Candida albicans: NOT DETECTED
Candida glabrata: NOT DETECTED
Candida krusei: NOT DETECTED
Candida parapsilosis: NOT DETECTED
Candida tropicalis: NOT DETECTED
Enterobacter cloacae complex: NOT DETECTED
Enterobacteriaceae species: NOT DETECTED
Enterococcus species: NOT DETECTED
Escherichia coli: NOT DETECTED
Haemophilus influenzae: NOT DETECTED
KLEBSIELLA PNEUMONIAE: NOT DETECTED
Klebsiella oxytoca: NOT DETECTED
Listeria monocytogenes: NOT DETECTED
NEISSERIA MENINGITIDIS: NOT DETECTED
Proteus species: NOT DETECTED
Pseudomonas aeruginosa: NOT DETECTED
STAPHYLOCOCCUS AUREUS BCID: NOT DETECTED
Serratia marcescens: NOT DETECTED
Staphylococcus species: NOT DETECTED
Streptococcus agalactiae: NOT DETECTED
Streptococcus pneumoniae: NOT DETECTED
Streptococcus pyogenes: NOT DETECTED
Streptococcus species: NOT DETECTED

## 2018-03-04 LAB — MAGNESIUM: MAGNESIUM: 1.7 mg/dL (ref 1.7–2.4)

## 2018-03-04 LAB — CBC
HCT: 34.1 % — ABNORMAL LOW (ref 36.0–46.0)
Hemoglobin: 11.3 g/dL — ABNORMAL LOW (ref 12.0–15.0)
MCH: 31.8 pg (ref 26.0–34.0)
MCHC: 33.1 g/dL (ref 30.0–36.0)
MCV: 96.1 fL (ref 80.0–100.0)
PLATELETS: 183 10*3/uL (ref 150–400)
RBC: 3.55 MIL/uL — ABNORMAL LOW (ref 3.87–5.11)
RDW: 12.9 % (ref 11.5–15.5)
WBC: 14 10*3/uL — ABNORMAL HIGH (ref 4.0–10.5)
nRBC: 0 % (ref 0.0–0.2)

## 2018-03-04 LAB — GLUCOSE, CAPILLARY
Glucose-Capillary: 100 mg/dL — ABNORMAL HIGH (ref 70–99)
Glucose-Capillary: 113 mg/dL — ABNORMAL HIGH (ref 70–99)
Glucose-Capillary: 128 mg/dL — ABNORMAL HIGH (ref 70–99)
Glucose-Capillary: 129 mg/dL — ABNORMAL HIGH (ref 70–99)
Glucose-Capillary: 76 mg/dL (ref 70–99)
Glucose-Capillary: 82 mg/dL (ref 70–99)
Glucose-Capillary: 96 mg/dL (ref 70–99)

## 2018-03-04 LAB — BASIC METABOLIC PANEL
Anion gap: 16 — ABNORMAL HIGH (ref 5–15)
BUN: 64 mg/dL — ABNORMAL HIGH (ref 8–23)
CO2: 16 mmol/L — ABNORMAL LOW (ref 22–32)
Calcium: 7.7 mg/dL — ABNORMAL LOW (ref 8.9–10.3)
Chloride: 106 mmol/L (ref 98–111)
Creatinine, Ser: 2.21 mg/dL — ABNORMAL HIGH (ref 0.44–1.00)
GFR calc Af Amer: 24 mL/min — ABNORMAL LOW (ref >60)
GFR calc non Af Amer: 20 mL/min — ABNORMAL LOW (ref >60)
Glucose, Bld: 98 mg/dL (ref 70–99)
Potassium: 3 mmol/L — ABNORMAL LOW (ref 3.5–5.1)
SODIUM: 138 mmol/L (ref 135–145)

## 2018-03-04 LAB — MRSA PCR SCREENING: MRSA by PCR: NEGATIVE

## 2018-03-04 LAB — TSH: TSH: 1.042 u[IU]/mL (ref 0.350–4.500)

## 2018-03-04 MED ORDER — POTASSIUM CHLORIDE CRYS ER 20 MEQ PO TBCR
40.0000 meq | EXTENDED_RELEASE_TABLET | Freq: Once | ORAL | Status: AC
  Administered 2018-03-04: 40 meq via ORAL
  Filled 2018-03-04: qty 2

## 2018-03-04 MED ORDER — SODIUM CHLORIDE 0.9 % IV SOLN
1.0000 g | Freq: Every day | INTRAVENOUS | Status: DC
  Administered 2018-03-04 – 2018-03-06 (×3): 1 g via INTRAVENOUS
  Filled 2018-03-04 (×3): qty 1

## 2018-03-04 MED ORDER — ORAL CARE MOUTH RINSE
15.0000 mL | Freq: Two times a day (BID) | OROMUCOSAL | Status: DC
  Administered 2018-03-04 – 2018-03-05 (×4): 15 mL via OROMUCOSAL

## 2018-03-04 MED ORDER — SODIUM CHLORIDE 0.9 % IV BOLUS
1000.0000 mL | Freq: Once | INTRAVENOUS | Status: AC
  Administered 2018-03-04: 1000 mL via INTRAVENOUS

## 2018-03-04 MED ORDER — POTASSIUM CHLORIDE 10 MEQ/100ML IV SOLN
10.0000 meq | INTRAVENOUS | Status: AC
  Administered 2018-03-04 (×4): 10 meq via INTRAVENOUS
  Filled 2018-03-04 (×4): qty 100

## 2018-03-04 MED ORDER — KETOROLAC TROMETHAMINE 30 MG/ML IJ SOLN: 15.0000 mg | Freq: Once | INTRAMUSCULAR | Status: DC

## 2018-03-04 MED ORDER — SODIUM CHLORIDE 0.9 % IV BOLUS
500.0000 mL | Freq: Once | INTRAVENOUS | Status: AC
  Administered 2018-03-04: 500 mL via INTRAVENOUS

## 2018-03-04 NOTE — Progress Notes (Signed)
Patient c/o generalized pain of 9/10 on a pain scale. It wasn't time to for her to get any of her pain medications. Paged [place] NP and received new order for Toradol 15 mg IV x one dose. Order already implemented. Will continue to monitor.

## 2018-03-04 NOTE — Progress Notes (Addendum)
Resumed care from Dahlia Bailiff RN around 0400 with low (84/43) BP. She had already paged the provider to get order for bolus. BP now 125/96 after the 1 liter bolus. No acute distress noted. Will continue to monitor.

## 2018-03-04 NOTE — Progress Notes (Signed)
1 Day Post-Op Subjective: Patient reports that she feels fine.  Much more lucid today.  Objective: Vital signs in last 24 hours: Temp:  [97.6 F (36.4 C)-103.3 F (39.6 C)] 98.2 F (36.8 C) (02/01 1315) Pulse Rate:  [79-156] 79 (02/01 1315) Resp:  [19-40] 23 (02/01 1315) BP: (63-154)/(31-139) 130/74 (02/01 1315) SpO2:  [81 %-100 %] 98 % (02/01 1315) Weight:  [69.7 kg] 69.7 kg (02/01 0500)  Intake/Output from previous day: 01/31 0701 - 02/01 0700 In: 3388 [I.V.:1940.3; IV Piggyback:1447.7] Out: 315 [Urine:315] Intake/Output this shift: Total I/O In: -  Out: [place] [Urine:675]  Physical Exam:  Constitutional: Vital signs reviewed. WD WN in NAD   Eyes: PERRL, No scleral icterus.   Cardiovascular: RRR Pulmonary/Chest: Normal effort   Lab Results: Recent Labs    03/03/18 1327 03/04/18 0045  HGB 11.3* 11.3*  HCT 34.9* 34.1*   BMET Recent Labs    03/03/18 1327 03/04/18 0045  NA 133* 138  K 3.3* 3.0*  CL 99 106  CO2 15* 16*  GLUCOSE 176* 98  BUN 73* 64*  CREATININE 2.67* 2.21*  CALCIUM 7.8* 7.7*   No results for input(s): LABPT, INR in the last 72 hours. No results for input(s): LABURIN in the last 72 hours. Results for orders placed or performed during the hospital encounter of 03/03/18  Blood Culture (routine x 2)     Status: None (Preliminary result)   Collection Time: 03/03/18  1:20 PM  Result Value Ref Range Status   Specimen Description BLOOD LEFT FOREARM  Final   Special Requests   Final    BOTTLES DRAWN AEROBIC AND ANAEROBIC Blood Culture adequate volume   Culture  Setup Time   Final    GRAM NEGATIVE RODS ANAEROBIC BOTTLE ONLY CRITICAL RESULT CALLED TO, READ BACK BY AND VERIFIED WITHDenton Brick [facility] [id number] 03/04/18 A BROWNING Performed at [facility], [address], [place], [place] [address]    Culture NO GROWTH < 24 HOURS  Final   Report Status PENDING  Incomplete  Urine culture     Status: Abnormal (Preliminary result)   Collection Time:  03/03/18  1:28 PM  Result Value Ref Range Status   Specimen Description URINE, RANDOM  Final   Special Requests NONE  Final   Culture (A)  Final    >=100,000 COLONIES/mL GRAM NEGATIVE RODS CULTURE REINCUBATED FOR BETTER GROWTH Performed at [facility], [place] [address] [place], [place] [address]    Report Status PENDING  Incomplete  Blood Culture (routine x 2)     Status: None (Preliminary result)   Collection Time: 03/03/18  1:33 PM  Result Value Ref Range Status   Specimen Description BLOOD RIGHT WRIST  Final   Special Requests   Final    BOTTLES DRAWN AEROBIC AND ANAEROBIC Blood Culture adequate volume Performed at [facility], [place] [address] [place], [place] [address]    Culture  Setup Time   Final    GRAM NEGATIVE RODS Organism ID to follow CRITICAL RESULT CALLED TO, READ BACK BY AND VERIFIED WITH: Denton Brick PHARMD [id number] 03/04/18 A BROWNING    Culture NO GROWTH < 24 HOURS  Final   Report Status PENDING  Incomplete  Blood Culture ID Panel (Reflexed)     Status: None   Collection Time: 03/03/18  1:33 PM  Result Value Ref Range Status   Enterococcus species NOT DETECTED NOT DETECTED Final   Listeria monocytogenes NOT DETECTED NOT DETECTED Final  Staphylococcus species NOT DETECTED NOT DETECTED Final   Staphylococcus aureus (BCID) NOT DETECTED NOT DETECTED Final   Streptococcus species NOT DETECTED NOT DETECTED Final   Streptococcus agalactiae NOT DETECTED NOT DETECTED Final   Streptococcus pneumoniae NOT DETECTED NOT DETECTED Final   Streptococcus pyogenes NOT DETECTED NOT DETECTED Final   Acinetobacter baumannii NOT DETECTED NOT DETECTED Final   Enterobacteriaceae species NOT DETECTED NOT DETECTED Final   Enterobacter cloacae complex NOT DETECTED NOT DETECTED Final   Escherichia coli NOT DETECTED NOT DETECTED Final   Klebsiella oxytoca NOT DETECTED NOT DETECTED Final   Klebsiella pneumoniae NOT DETECTED NOT DETECTED Final   Proteus species NOT DETECTED NOT  DETECTED Final   Serratia marcescens NOT DETECTED NOT DETECTED Final   Haemophilus influenzae NOT DETECTED NOT DETECTED Final   Neisseria meningitidis NOT DETECTED NOT DETECTED Final   Pseudomonas aeruginosa NOT DETECTED NOT DETECTED Final   Candida albicans NOT DETECTED NOT DETECTED Final   Candida glabrata NOT DETECTED NOT DETECTED Final   Candida krusei NOT DETECTED NOT DETECTED Final   Candida parapsilosis NOT DETECTED NOT DETECTED Final   Candida tropicalis NOT DETECTED NOT DETECTED Final    Comment: Performed at [facility], [place] [address], [place], [place] [address]  MRSA PCR Screening     Status: None   Collection Time: 03/04/18  3:34 AM  Result Value Ref Range Status   MRSA by PCR NEGATIVE NEGATIVE Final    Comment:        The GeneXpert MRSA Assay (FDA approved for NASAL specimens only), is one component of a comprehensive MRSA colonization surveillance program. It is not intended to diagnose MRSA infection nor to guide or monitor treatment for MRSA infections. Performed at [facility], [place] [address], [place], [place] [address]     Studies/Results: Ct Abdomen Pelvis Wo Contrast  Result Date: 03/03/2018 CLINICAL DATA:  Abdominal distension. EXAM: CT ABDOMEN AND PELVIS WITHOUT CONTRAST TECHNIQUE: Multidetector CT imaging of the abdomen and pelvis was performed following the standard protocol without IV contrast. COMPARISON:  05/27/2015 FINDINGS: Lower chest: Small dependent pleural effusions with mild dependent basilar atelectasis. Lung bases are largely clear. Hepatobiliary: Gallbladder is distended. No calcified gallstones. No liver parenchymal abnormality seen. Pancreas: Negative Spleen: Negative Adrenals/Urinary Tract: No adrenal mass. No stones in the right kidney is self. There are 2 or 3 stones in the extrarenal pelvis on the right, apparently not causing obstruction presently. The remainder of the right ureter is normal. The left kidney contains multiple  nonobstructing stones. There are a few stones in the renal collecting system and renal pelvis. The ureter is markedly dilated all the way to the bladder where there is a UVJ stone measuring 7 8 mm in size. No stone in the bladder. Small bladder diverticulum on the left. Stomach/Bowel: No acute abnormal bowel finding. No evidence of ileus or obstruction. No inflammatory change seen. Some wall thickening in the sigmoid region could be due to mild chronic diverticulosis. Vascular/Lymphatic: Aortic atherosclerosis. No aneurysm. IVC is normal. No retroperitoneal adenopathy. Reproductive: Previous hysterectomy. No pelvic mass. Other: No free fluid or air. Musculoskeletal: Previous lumbosacral fusion. No acute bone finding. IMPRESSION: 1. 7-8 mm stone at the left UVJ with marked left hydroureteronephrosis. Multiple nonobstructing stones in the left kidney, renal collecting system and extrarenal pelvis. 2. Two or 3 stones in the extrarenal pelvis on the right, apparently not causing obstruction presently. 3. Small bilateral pleural effusions with mild dependent basilar atelectasis. 4. Distended gallbladder  without calcified gallstones. 5. Wall thickening of the sigmoid colon without a masslike configuration. This could relate to chronic diverticulosis. Aortic Atherosclerosis (ICD10-I70.0). Electronically Signed   By: Nelson Chimes M.D.   On: 03/03/2018 15:23   Dg Chest Port 1 View  Result Date: 03/03/2018 CLINICAL DATA:  generalized abdominal pain, n/v x 4 days. fever EXAM: PORTABLE CHEST 1 VIEW COMPARISON:  09/12/2015 FINDINGS: Cardiac silhouette is normal in size. No mediastinal or hilar masses. No evidence of adenopathy. Mild linear opacity in the left mid lung consistent with scarring or atelectasis. Lungs otherwise clear. No pleural effusion or pneumothorax. Skeletal structures are grossly intact. IMPRESSION: No active disease. Electronically Signed   By: Lajean Manes M.D.   On: 03/03/2018 13:54   Dg C-arm 1-60  Min-no Report  Result Date: 03/04/2018 Fluoroscopy was utilized by the requesting physician.  No radiographic interpretation.   Urine culture growing greater than 100,000 colonies of gram-negative rods. Blood cultures negative so far Assessment/Plan:   Postoperative day #1 bilateral stenting for right renal pelvic stones as well as infected left lower ureteral stone.  She seems to be doing well    She will need long-term course of IV/p.o. antibiotics.  She will need follow-up in our office with eventual scheduling of bilateral ureteroscopy.    We will continue to follow   LOS: 1 day   Jorja Loa 03/04/2018, 1:49 PM

## 2018-03-04 NOTE — Progress Notes (Signed)
Pts BP 63/31 (MAP-41). TRH-on call provider Kennon Holter, NP paged and notified. Will continue to monitor closely. Clint Bolder, RN 03/04/18 02:20 AM

## 2018-03-04 NOTE — Progress Notes (Signed)
Subjective:  Patient more alert and awake denies any chest pain or shortness of breath.  Remains in sinus rhythm.  No active cardiac issues.  Renal function slowly improving.  Off Cardizem drip heart rate in 70s now  Objective:  Vital Signs in the last 24 hours: Temp:  [97.6 F (36.4 C)-103.3 F (39.6 C)] 97.8 F (36.6 C) (02/01 0819) Pulse Rate:  [79-156] 79 (02/01 0500) Resp:  [18-40] 22 (02/01 0819) BP: (63-154)/(31-139) 112/58 (02/01 0819) SpO2:  [81 %-100 %] 100 % (02/01 0819) Weight:  [68.5 kg-69.7 kg] 69.7 kg (02/01 0500)  Intake/Output from previous day: 01/31 0701 - 02/01 0700 In: 3388 [I.V.:1940.3; IV Piggyback:1447.7] Out: 315 [Urine:315] Intake/Output from this shift: No intake/output data recorded.  Physical Exam: Neck: no adenopathy, no carotid bruit, no JVD and supple, symmetrical, trachea midline Lungs: clear to auscultation bilaterally Heart: regular rate and rhythm, S1, S2 normal and 2/6 systolic murmur noted Abdomen: soft, non-tender; bowel sounds normal; no masses,  no organomegaly  Lab Results: Recent Labs    03/03/18 1327 03/04/18 0045  WBC 16.1* 14.0*  HGB 11.3* 11.3*  PLT 209 183   Recent Labs    03/03/18 1327 03/04/18 0045  NA 133* 138  K 3.3* 3.0*  CL 99 106  CO2 15* 16*  GLUCOSE 176* 98  BUN 73* 64*  CREATININE 2.67* 2.21*   No results for input(s): TROPONINI in the last 72 hours.  Invalid input(s): CK, MB Hepatic Function Panel Recent Labs    03/03/18 1327  PROT 6.6  ALBUMIN 2.4*  AST 20  ALT 18  ALKPHOS 72  BILITOT 1.2   No results for input(s): CHOL in the last 72 hours. No results for input(s): PROTIME in the last 72 hours.  Imaging: Imaging results have been reviewed and Ct Abdomen Pelvis Wo Contrast  Result Date: 03/03/2018 CLINICAL DATA:  Abdominal distension. EXAM: CT ABDOMEN AND PELVIS WITHOUT CONTRAST TECHNIQUE: Multidetector CT imaging of the abdomen and pelvis was performed following the standard protocol  without IV contrast. COMPARISON:  05/27/2015 FINDINGS: Lower chest: Small dependent pleural effusions with mild dependent basilar atelectasis. Lung bases are largely clear. Hepatobiliary: Gallbladder is distended. No calcified gallstones. No liver parenchymal abnormality seen. Pancreas: Negative Spleen: Negative Adrenals/Urinary Tract: No adrenal mass. No stones in the right kidney is self. There are 2 or 3 stones in the extrarenal pelvis on the right, apparently not causing obstruction presently. The remainder of the right ureter is normal. The left kidney contains multiple nonobstructing stones. There are a few stones in the renal collecting system and renal pelvis. The ureter is markedly dilated all the way to the bladder where there is a UVJ stone measuring 7 8 mm in size. No stone in the bladder. Small bladder diverticulum on the left. Stomach/Bowel: No acute abnormal bowel finding. No evidence of ileus or obstruction. No inflammatory change seen. Some wall thickening in the sigmoid region could be due to mild chronic diverticulosis. Vascular/Lymphatic: Aortic atherosclerosis. No aneurysm. IVC is normal. No retroperitoneal adenopathy. Reproductive: Previous hysterectomy. No pelvic mass. Other: No free fluid or air. Musculoskeletal: Previous lumbosacral fusion. No acute bone finding. IMPRESSION: 1. 7-8 mm stone at the left UVJ with marked left hydroureteronephrosis. Multiple nonobstructing stones in the left kidney, renal collecting system and extrarenal pelvis. 2. Two or 3 stones in the extrarenal pelvis on the right, apparently not causing obstruction presently. 3. Small bilateral pleural effusions with mild dependent basilar atelectasis. 4. Distended gallbladder without calcified gallstones. 5. Wall  thickening of the sigmoid colon without a masslike configuration. This could relate to chronic diverticulosis. Aortic Atherosclerosis (ICD10-I70.0). Electronically Signed   By: Nelson Chimes M.D.   On: 03/03/2018  15:23   Dg Chest Port 1 View  Result Date: 03/03/2018 CLINICAL DATA:  generalized abdominal pain, n/v x 4 days. fever EXAM: PORTABLE CHEST 1 VIEW COMPARISON:  09/12/2015 FINDINGS: Cardiac silhouette is normal in size. No mediastinal or hilar masses. No evidence of adenopathy. Mild linear opacity in the left mid lung consistent with scarring or atelectasis. Lungs otherwise clear. No pleural effusion or pneumothorax. Skeletal structures are grossly intact. IMPRESSION: No active disease. Electronically Signed   By: Lajean Manes M.D.   On: 03/03/2018 13:54   Dg C-arm 1-60 Min-no Report  Result Date: 03/04/2018 Fluoroscopy was utilized by the requesting physician.  No radiographic interpretation.    Cardiac Studies:  Assessment/Plan:  Status post questionable A. fib with RVR possible baseline artifacts Acute pyelonephritis with mild left hydronephrosis secondary to UV junction left stone status post bilateral JJ stenting Right nephrolithiasis Hypertension Diabetes mellitus Hyperlipidemia Mild vascular dementia History of nephrolithiasis in the past Acute renal injury Dehydration Plan Continue present management per PMD/urology No active cardiac issues I will sign off please call if needed  LOS: 1 day    Charolette Forward 03/04/2018, 9:50 AM

## 2018-03-04 NOTE — Progress Notes (Signed)
PT Cancellation Note  Patient Details Name: NAYANA LENIG MRN: [id number] DOB: 12-15-1935   Cancelled Treatment:    Reason Eval/Treat Not Completed: Other (comment).  Pt declined but her family is there, stated she is not very active at baseline.  Pt is paraplegic, has minor LE movement observed by PT.  Return tomorrow to see if pt can be assisted to side of bed and see what mobility is available.  Per family she is usually spending more time in bed.   Ramond Dial 03/04/2018, 2:27 PM  Mee Hives, PT MS Acute Rehab Dept. Number: [place] [facility]

## 2018-03-04 NOTE — Progress Notes (Signed)
PHARMACY - PHYSICIAN COMMUNICATION CRITICAL VALUE ALERT - BLOOD CULTURE IDENTIFICATION (BCID)  Ana Newman is an 83 y.o. female who presented to [facility] on 03/03/2018 from [facility] with a chief complaint of UTI/kidney stones  Assessment:  2/2 Bloods cultures growing Gram negative rods, bacteria indeterminate on BCID  Name of physician (or Provider) Contacted: Jeannette Corpus  Current antibiotics: Rocephin  Changes to prescribed antibiotics recommended:   Suggest broadening with Cefepime to include better Pseudomonal/Citrobacter and Enterobacter coverage   Results for orders placed or performed during the hospital encounter of 03/03/18  Blood Culture ID Panel (Reflexed) (Collected: 03/03/2018  1:33 PM)  Result Value Ref Range   Enterococcus species NOT DETECTED NOT DETECTED   Listeria monocytogenes NOT DETECTED NOT DETECTED   Staphylococcus species NOT DETECTED NOT DETECTED   Staphylococcus aureus (BCID) NOT DETECTED NOT DETECTED   Streptococcus species NOT DETECTED NOT DETECTED   Streptococcus agalactiae NOT DETECTED NOT DETECTED   Streptococcus pneumoniae NOT DETECTED NOT DETECTED   Streptococcus pyogenes NOT DETECTED NOT DETECTED   Acinetobacter baumannii NOT DETECTED NOT DETECTED   Enterobacteriaceae species NOT DETECTED NOT DETECTED   Enterobacter cloacae complex NOT DETECTED NOT DETECTED   Escherichia coli NOT DETECTED NOT DETECTED   Klebsiella oxytoca NOT DETECTED NOT DETECTED   Klebsiella pneumoniae NOT DETECTED NOT DETECTED   Proteus species NOT DETECTED NOT DETECTED   Serratia marcescens NOT DETECTED NOT DETECTED   Haemophilus influenzae NOT DETECTED NOT DETECTED   Neisseria meningitidis NOT DETECTED NOT DETECTED   Pseudomonas aeruginosa NOT DETECTED NOT DETECTED   Candida albicans NOT DETECTED NOT DETECTED   Candida glabrata NOT DETECTED NOT DETECTED   Candida krusei NOT DETECTED NOT DETECTED   Candida parapsilosis NOT DETECTED NOT DETECTED   Candida  tropicalis NOT DETECTED NOT DETECTED    Caryl Pina 03/04/2018  4:15 AM

## 2018-03-04 NOTE — Progress Notes (Signed)
Pts BP still low after 575mL bolus of NS 90/40 (MAP-55). [facility] on-call provider paged and notified. Received orders for 1L NS bolus. Will continue to monitor closely. Clint Bolder, RN 03/04/18 03:30 AM

## 2018-03-04 NOTE — Progress Notes (Signed)
RN assumed care of pt @ 0100. Pt in bed resting. BP on assessment 94/40 (MAP-55) on diltiazem gtt @ 5mg /hr HR-86. Diltiazem gtt turned off. Will continue to monitor pt closely. Clint Bolder, RN 03/04/18

## 2018-03-04 NOTE — Progress Notes (Signed)
Patient ID: Ana Newman, female   DOB: September 02, 1935, 83 y.o.   MRN: [id number]  PROGRESS NOTE    Ana Newman  PYP:[id number] DOB: 23-May-1935 DOA: 03/03/2018 PCP: Patient, No Pcp Per   Brief Narrative:  83 y.o. female with medical history significant of dementia, diabetes mellitus type 2, hypertension, hyperlipidemia, renal/ureteral stones requiring cystoscopy and stent placement and removal in the past, nursing home resident was sent from nursing home for evaluation of fever.  She was found to have acute pyonephritis from left UVJ stone and hydroureteronephrosis.  Urology was consulted.  She underwent cystoscopy and bilateral double-J stents placement by urology on 03/03/2018.  Postprocedure, there was concern for A. fib with RVR for which cardiology was consulted.  Assessment & Plan:   Principal Problem:   Pyelonephritis Active Problems:   Hypothyroidism   Dyslipidemia associated with type 2 diabetes mellitus (HCC)   Diabetes mellitus, type II, insulin dependent ([place])   Sepsis ([place])   AKI (acute kidney injury) ([place])   Leukocytosis   Ureterolithiasis   Sepsis from bacteremia and acute pyelonephritis -Antibiotics plan as below.  Change normal saline to 75 cc an hour.  Follow cultures and identification.  Gram-negative rod bacteremia -Continue antibiotics.  Follow identification and sensitivity  Acute left-sided pyelonephritis with obstructing left UVJ stone causing left hydroureteronephrosis -Urology was consulted.  She underwent cystoscopy and bilateral double-J stents placement by urology on 03/03/2018.  Continue broad-spectrum antibiotics.   Postprocedure tachycardia -Question of A. fib with RVR.  Cardiology evaluation appreciated.  Required Cardizem postprocedure.  Currently rate is controlled.  Monitor  Leukocytosis -Secondary to above.  Improving.  Monitor  Acute kidney injury -Most likely prerenal from above.  Improving. IV fluids as above.  Repeat a.m.  labs  Hypokalemia -Replace.  Repeat a.m. labs  Alzheimer's dementia without behavioral disturbance -Monitor mental status.  Fall precautions.  Diabetes mellitus type 2, insulin-dependent -CBGs with SSI.  Blood sugars on the lower side.  Hypertension  -Antihypertensives on hold.  Monitor  Dyslipidemia  -continue Lipitor   DVT prophylaxis: We will start heparin subcutaneous Code Status: DNR Family Communication: None at bedside Disposition Plan: SNF in 1 to 2 days if clinically improves  Consultants: Urology/cardiology  Procedures:  cystoscopy and bilateral double-J stents placement by urology on 03/03/2018.    Antimicrobials: Rocephin on 03/03/2018 Cefepime from 03/04/2018 onwards   Subjective: Patient seen and examined at bedside.  She is a poor historian, confused.  Complains of some abdominal pain.  Objective: Vitals:   03/04/18 0403 03/04/18 0500 03/04/18 0630 03/04/18 0819  BP: (!) 109/95  (!) 125/96 (!) 112/58  Pulse: 91 79    Resp: 19 20 19  (!) 22  Temp:    97.8 F (36.6 C)  TempSrc:    Oral  SpO2: 100% 100%  100%  Weight:  69.7 kg    Height:        Intake/Output Summary (Last 24 hours) at 03/04/2018 0945 Last data filed at 03/04/2018 0300 Gross per 24 hour  Intake 3388.03 ml  Output 315 ml  Net 3073.03 ml   Filed Weights   03/03/18 1311 03/04/18 0500  Weight: 68.5 kg 69.7 kg    Examination:  General exam: Elderly female lying in bed, awake but confused.  No distress Respiratory system: Bilateral decreased breath sounds at bases, no wheezing Cardiovascular system: S1 & S2 heard, Rate controlled Gastrointestinal system: Abdomen is nondistended, soft and mildly tender in the lower quadrant. Normal bowel sounds heard. Extremities: No cyanosis,  clubbing with trace edema    Data Reviewed: I have personally reviewed following labs and imaging studies  CBC: Recent Labs  Lab 03/03/18 1327 03/04/18 0045  WBC 16.1* 14.0*  NEUTROABS 14.8*  --   HGB  11.3* 11.3*  HCT 34.9* 34.1*  MCV 97.2 96.1  PLT 209 010   Basic Metabolic Panel: Recent Labs  Lab 03/03/18 1327 03/04/18 0045  NA 133* 138  K 3.3* 3.0*  CL 99 106  CO2 15* 16*  GLUCOSE 176* 98  BUN 73* 64*  CREATININE 2.67* 2.21*  CALCIUM 7.8* 7.7*  MG  --  1.7   GFR: Estimated Creatinine Clearance: 18.6 mL/min (A) (by C-G formula based on SCr of 2.21 mg/dL (H)). Liver Function Tests: Recent Labs  Lab 03/03/18 1327  AST 20  ALT 18  ALKPHOS 72  BILITOT 1.2  PROT 6.6  ALBUMIN 2.4*   Recent Labs  Lab 03/03/18 1327  LIPASE 23   No results for input(s): AMMONIA in the last 168 hours. Coagulation Profile: No results for input(s): INR, PROTIME in the last 168 hours. Cardiac Enzymes: No results for input(s): CKTOTAL, CKMB, CKMBINDEX, TROPONINI in the last 168 hours. BNP (last 3 results) No results for input(s): PROBNP in the last 8760 hours. HbA1C: No results for input(s): HGBA1C in the last 72 hours. CBG: Recent Labs  Lab 03/03/18 2007 03/03/18 2124 03/04/18 0102 03/04/18 0557 03/04/18 0757  GLUCAP 115* 95 96 113* 100*   Lipid Profile: No results for input(s): CHOL, HDL, LDLCALC, TRIG, CHOLHDL, LDLDIRECT in the last 72 hours. Thyroid Function Tests: Recent Labs    03/04/18 0045  TSH 1.042   Anemia Panel: No results for input(s): VITAMINB12, FOLATE, FERRITIN, TIBC, IRON, RETICCTPCT in the last 72 hours. Sepsis Labs: Recent Labs  Lab 03/03/18 1327 03/03/18 1527  LATICACIDVEN 1.7 1.0    Recent Results (from the past 240 hour(s))  Blood Culture (routine x 2)     Status: None (Preliminary result)   Collection Time: 03/03/18  1:20 PM  Result Value Ref Range Status   Specimen Description BLOOD LEFT FOREARM  Final   Special Requests   Final    BOTTLES DRAWN AEROBIC AND ANAEROBIC Blood Culture adequate volume   Culture  Setup Time   Final    GRAM NEGATIVE RODS ANAEROBIC BOTTLE ONLY CRITICAL RESULT CALLED TO, READ BACK BY AND VERIFIED WITHDenton Brick [facility] [id number] 03/04/18 A BROWNING Performed at [facility], [place] [address], [place], [place] [address]    Culture NO GROWTH < 24 HOURS  Final   Report Status PENDING  Incomplete  Blood Culture (routine x 2)     Status: None (Preliminary result)   Collection Time: 03/03/18  1:33 PM  Result Value Ref Range Status   Specimen Description BLOOD RIGHT WRIST  Final   Special Requests   Final    BOTTLES DRAWN AEROBIC AND ANAEROBIC Blood Culture adequate volume Performed at [facility], [place] [address], [place], [place] [address]    Culture  Setup Time   Final    GRAM NEGATIVE RODS Organism ID to follow CRITICAL RESULT CALLED TO, READ BACK BY AND VERIFIED WITH: Denton Brick PHAR[facility] [id number] 03/04/18 A BROWNING    Culture NO GROWTH < 24 HOURS  Final   Report Status PENDING  Incomplete  Blood Culture ID Panel (Reflexed)     Status: None   Collection Time: 03/03/18  1:33 PM  Result Value Ref Range Status  Enterococcus species NOT DETECTED NOT DETECTED Final   Listeria monocytogenes NOT DETECTED NOT DETECTED Final   Staphylococcus species NOT DETECTED NOT DETECTED Final   Staphylococcus aureus (BCID) NOT DETECTED NOT DETECTED Final   Streptococcus species NOT DETECTED NOT DETECTED Final   Streptococcus agalactiae NOT DETECTED NOT DETECTED Final   Streptococcus pneumoniae NOT DETECTED NOT DETECTED Final   Streptococcus pyogenes NOT DETECTED NOT DETECTED Final   Acinetobacter baumannii NOT DETECTED NOT DETECTED Final   Enterobacteriaceae species NOT DETECTED NOT DETECTED Final   Enterobacter cloacae complex NOT DETECTED NOT DETECTED Final   Escherichia coli NOT DETECTED NOT DETECTED Final   Klebsiella oxytoca NOT DETECTED NOT DETECTED Final   Klebsiella pneumoniae NOT DETECTED NOT DETECTED Final   Proteus species NOT DETECTED NOT DETECTED Final   Serratia marcescens NOT DETECTED NOT DETECTED Final   Haemophilus influenzae NOT DETECTED NOT DETECTED Final   Neisseria meningitidis NOT  DETECTED NOT DETECTED Final   Pseudomonas aeruginosa NOT DETECTED NOT DETECTED Final   Candida albicans NOT DETECTED NOT DETECTED Final   Candida glabrata NOT DETECTED NOT DETECTED Final   Candida krusei NOT DETECTED NOT DETECTED Final   Candida parapsilosis NOT DETECTED NOT DETECTED Final   Candida tropicalis NOT DETECTED NOT DETECTED Final    Comment: Performed at [facility], [address] [place], [place] [address]  MRSA PCR Screening     Status: None   Collection Time: 03/04/18  3:34 AM  Result Value Ref Range Status   MRSA by PCR NEGATIVE NEGATIVE Final    Comment:        The GeneXpert MRSA Assay (FDA approved for NASAL specimens only), is one component of a comprehensive MRSA colonization surveillance program. It is not intended to diagnose MRSA infection nor to guide or monitor treatment for MRSA infections. Performed at [facility], [place] [address], [place], [place] [address]          Radiology Studies: Ct Abdomen Pelvis Wo Contrast  Result Date: 03/03/2018 CLINICAL DATA:  Abdominal distension. EXAM: CT ABDOMEN AND PELVIS WITHOUT CONTRAST TECHNIQUE: Multidetector CT imaging of the abdomen and pelvis was performed following the standard protocol without IV contrast. COMPARISON:  05/27/2015 FINDINGS: Lower chest: Small dependent pleural effusions with mild dependent basilar atelectasis. Lung bases are largely clear. Hepatobiliary: Gallbladder is distended. No calcified gallstones. No liver parenchymal abnormality seen. Pancreas: Negative Spleen: Negative Adrenals/Urinary Tract: No adrenal mass. No stones in the right kidney is self. There are 2 or 3 stones in the extrarenal pelvis on the right, apparently not causing obstruction presently. The remainder of the right ureter is normal. The left kidney contains multiple nonobstructing stones. There are a few stones in the renal collecting system and renal pelvis. The ureter is markedly dilated all the way to the  bladder where there is a UVJ stone measuring 7 8 mm in size. No stone in the bladder. Small bladder diverticulum on the left. Stomach/Bowel: No acute abnormal bowel finding. No evidence of ileus or obstruction. No inflammatory change seen. Some wall thickening in the sigmoid region could be due to mild chronic diverticulosis. Vascular/Lymphatic: Aortic atherosclerosis. No aneurysm. IVC is normal. No retroperitoneal adenopathy. Reproductive: Previous hysterectomy. No pelvic mass. Other: No free fluid or air. Musculoskeletal: Previous lumbosacral fusion. No acute bone finding. IMPRESSION: 1. 7-8 mm stone at the left UVJ with marked left hydroureteronephrosis. Multiple nonobstructing stones in the left kidney, renal collecting system and extrarenal pelvis. 2. Two or 3 stones in  the extrarenal pelvis on the right, apparently not causing obstruction presently. 3. Small bilateral pleural effusions with mild dependent basilar atelectasis. 4. Distended gallbladder without calcified gallstones. 5. Wall thickening of the sigmoid colon without a masslike configuration. This could relate to chronic diverticulosis. Aortic Atherosclerosis (ICD10-I70.0). Electronically Signed   By: Nelson Chimes M.D.   On: 03/03/2018 15:23   Dg Chest Port 1 View  Result Date: 03/03/2018 CLINICAL DATA:  generalized abdominal pain, n/v x 4 days. fever EXAM: PORTABLE CHEST 1 VIEW COMPARISON:  09/12/2015 FINDINGS: Cardiac silhouette is normal in size. No mediastinal or hilar masses. No evidence of adenopathy. Mild linear opacity in the left mid lung consistent with scarring or atelectasis. Lungs otherwise clear. No pleural effusion or pneumothorax. Skeletal structures are grossly intact. IMPRESSION: No active disease. Electronically Signed   By: Lajean Manes M.D.   On: 03/03/2018 13:54   Dg C-arm 1-60 Min-no Report  Result Date: 03/04/2018 Fluoroscopy was utilized by the requesting physician.  No radiographic interpretation.         Scheduled Meds: . insulin aspart  0-15 Units Subcutaneous TID WC  . insulin aspart  0-5 Units Subcutaneous QHS  . ketorolac  15 mg Intravenous Once  . mouth rinse  15 mL Mouth Rinse BID   Continuous Infusions: . sodium chloride 75 mL/hr at 03/04/18 0804  . sodium chloride    . ceFEPime (MAXIPIME) IV 1 g (03/04/18 0811)  . diltiazem (CARDIZEM) infusion Stopped (03/04/18 0104)     LOS: 1 day        [name], MD [facility] Pager [phone]  If 7PM-7AM, please contact night-coverage [email or web address] Password [facility] 03/04/2018, 9:45 AM

## 2018-03-04 NOTE — Progress Notes (Signed)
Pts BP 80/43 (MAP-55). K+ 3.0 [facility] on-call provider, Kennon Holter, NP paged and notified. Will continue to monitor closely. See MAR for new orders. Clint Bolder, RN 03/04/18

## 2018-03-05 LAB — CBC WITH DIFFERENTIAL/PLATELET
Abs Immature Granulocytes: 0.19 10*3/uL — ABNORMAL HIGH (ref 0.00–0.07)
Basophils Absolute: 0 10*3/uL (ref 0.0–0.1)
Basophils Relative: 0 %
EOS PCT: 0 %
Eosinophils Absolute: 0 10*3/uL (ref 0.0–0.5)
HCT: 32.6 % — ABNORMAL LOW (ref 36.0–46.0)
Hemoglobin: 10.5 g/dL — ABNORMAL LOW (ref 12.0–15.0)
Immature Granulocytes: 2 %
Lymphocytes Relative: 14 %
Lymphs Abs: 1.7 10*3/uL (ref 0.7–4.0)
MCH: 31.1 pg (ref 26.0–34.0)
MCHC: 32.2 g/dL (ref 30.0–36.0)
MCV: 96.4 fL (ref 80.0–100.0)
Monocytes Absolute: 1.8 10*3/uL — ABNORMAL HIGH (ref 0.1–1.0)
Monocytes Relative: 15 %
Neutro Abs: 8.5 10*3/uL — ABNORMAL HIGH (ref 1.7–7.7)
Neutrophils Relative %: 69 %
Platelets: 180 10*3/uL (ref 150–400)
RBC: 3.38 MIL/uL — ABNORMAL LOW (ref 3.87–5.11)
RDW: 13 % (ref 11.5–15.5)
WBC: 12.2 10*3/uL — ABNORMAL HIGH (ref 4.0–10.5)
nRBC: 0 % (ref 0.0–0.2)

## 2018-03-05 LAB — BASIC METABOLIC PANEL
Anion gap: 9 (ref 5–15)
BUN: 29 mg/dL — ABNORMAL HIGH (ref 8–23)
CO2: 16 mmol/L — AB (ref 22–32)
Calcium: 7.9 mg/dL — ABNORMAL LOW (ref 8.9–10.3)
Chloride: 110 mmol/L (ref 98–111)
Creatinine, Ser: 0.93 mg/dL (ref 0.44–1.00)
GFR calc Af Amer: >60 mL/min (ref >60)
GFR calc non Af Amer: 58 mL/min — ABNORMAL LOW (ref >60)
Glucose, Bld: 113 mg/dL — ABNORMAL HIGH (ref 70–99)
Potassium: 4 mmol/L (ref 3.5–5.1)
Sodium: 135 mmol/L (ref 135–145)

## 2018-03-05 LAB — GLUCOSE, CAPILLARY
GLUCOSE-CAPILLARY: 133 mg/dL — AB (ref 70–99)
Glucose-Capillary: 95 mg/dL (ref 70–99)
Glucose-Capillary: 91 mg/dL (ref 70–99)
Glucose-Capillary: 138 mg/dL — ABNORMAL HIGH (ref 70–99)
Glucose-Capillary: 122 mg/dL — ABNORMAL HIGH (ref 70–99)

## 2018-03-05 LAB — MAGNESIUM: Magnesium: 1.5 mg/dL — ABNORMAL LOW (ref 1.7–2.4)

## 2018-03-05 MED ORDER — POLYETHYLENE GLYCOL 3350 17 G PO PACK: 17.0000 g | PACK | Freq: Two times a day (BID) | ORAL | Status: DC | PRN

## 2018-03-05 MED ORDER — POLYVINYL ALCOHOL-POVIDONE 2.7-2 % OP SOLN: 1.0000 [drp] | Freq: Three times a day (TID) | OPHTHALMIC | Status: DC

## 2018-03-05 MED ORDER — ATORVASTATIN CALCIUM 10 MG PO TABS
10.0000 mg | ORAL_TABLET | Freq: Every day | ORAL | Status: DC
  Administered 2018-03-05: 10 mg via ORAL
  Filled 2018-03-05: qty 1

## 2018-03-05 MED ORDER — POLYVINYL ALCOHOL 1.4 % OP SOLN
1.0000 [drp] | Freq: Three times a day (TID) | OPHTHALMIC | Status: DC
  Administered 2018-03-05 – 2018-03-06 (×3): 1 [drp] via OPHTHALMIC
  Filled 2018-03-05: qty 15

## 2018-03-05 MED ORDER — METOPROLOL TARTRATE 25 MG PO TABS
25.0000 mg | ORAL_TABLET | Freq: Two times a day (BID) | ORAL | Status: DC
  Administered 2018-03-05 – 2018-03-06 (×3): 25 mg via ORAL
  Filled 2018-03-05 (×3): qty 1

## 2018-03-05 MED ORDER — LINACLOTIDE 72 MCG PO CAPS
72.0000 ug | ORAL_CAPSULE | Freq: Every day | ORAL | Status: DC
  Administered 2018-03-05 – 2018-03-06 (×2): 72 ug via ORAL
  Filled 2018-03-05 (×2): qty 1

## 2018-03-05 MED ORDER — ENOXAPARIN SODIUM 40 MG/0.4ML ~~LOC~~ SOLN
40.0000 mg | SUBCUTANEOUS | Status: DC
  Administered 2018-03-05 – 2018-03-06 (×2): 40 mg via SUBCUTANEOUS
  Filled 2018-03-05 (×2): qty 0.4

## 2018-03-05 MED ORDER — MAGNESIUM SULFATE 2 GM/50ML IV SOLN
2.0000 g | Freq: Once | INTRAVENOUS | Status: AC
  Administered 2018-03-05: 2 g via INTRAVENOUS
  Filled 2018-03-05: qty 50

## 2018-03-05 NOTE — Plan of Care (Signed)
  Problem: Education: Goal: Knowledge of General Education information will improve Description: Including pain rating scale, medication(s)/side effects and non-pharmacologic comfort measures Outcome: Progressing   Problem: Pain Managment: Goal: General experience of comfort will improve Outcome: Progressing   

## 2018-03-05 NOTE — Anesthesia Postprocedure Evaluation (Signed)
Anesthesia Post Note  Patient: Ana Newman  Procedure(s) Performed: CYSTOSCOPY WITH RETROGRADE PYELOGRAM/URETERAL STENT PLACEMENT (Bilateral Urethra)     Patient location during evaluation: PACU Anesthesia Type: General Level of consciousness: awake and alert Pain management: pain level controlled Vital Signs Assessment: post-procedure vital signs reviewed and stable Respiratory status: spontaneous breathing, nonlabored ventilation, respiratory function stable and patient connected to nasal cannula oxygen Cardiovascular status: tachycardic (HR in 140-150 range.  Appears irregular on monitor) Postop Assessment: no apparent nausea or vomiting Anesthetic complications: no Comments: Possible new onset a-fib occurring in PACU.  Cardiology consulted.  Cardizem gtt started.    Last Vitals:  Vitals:   03/05/18 0438 03/05/18 0516  BP:  (!) 152/82  Pulse:  92  Resp:  18  Temp: 37 C   SpO2:  97%    Last Pain:  Vitals:   03/05/18 0800  TempSrc:   PainSc: [place]

## 2018-03-05 NOTE — Transfer of Care (Signed)
Immediate Anesthesia Transfer of Care Note  Patient: Ana Newman  Procedure(s) Performed: CYSTOSCOPY WITH RETROGRADE PYELOGRAM/URETERAL STENT PLACEMENT (Bilateral Urethra)  Patient Location: PACU  Anesthesia Type:General  Level of Consciousness: drowsy, patient cooperative and responds to stimulation  Airway & Oxygen Therapy: Patient Spontanous Breathing and Patient connected to nasal cannula oxygen  Post-op Assessment: Report given to RN and Post -op Vital signs reviewed and stable  Post vital signs: Reviewed and stable  Last Vitals:  Vitals Value Taken Time  BP    Temp    Pulse 80 03/05/2018  8:12 AM  Resp 23 03/05/2018  8:12 AM  SpO2 96 % 03/05/2018  8:12 AM  Vitals shown include unvalidated device data.  Last Pain:  Vitals:   03/05/18 0457  TempSrc:   PainSc: Asleep      Patients Stated Pain Goal: 0 (79/39/03 0092)  Complications: No apparent anesthesia complications

## 2018-03-05 NOTE — Evaluation (Signed)
Physical Therapy Evaluation Patient Details Name: Ana Newman MRN: [id number] DOB: 1935-09-06 Today's Date: 03/05/2018   History of Present Illness  Ana Newman is a 83 y.o. female with medical history significant for dementia, diabetes mellitus type 2, hypertension, hyperlipidemia, renal/ureteral stones requiring cystoscopy and stent placement and removal in the past, nursing home resident was sent from nursing home for evaluation of fever. She was found to have acute pyonephritis from left UVJ stone and hydroureteronephrosis.  She underwent cystoscopy and bilateral double-J stents placement by urology on 03/03/2018.  Postprocedure, there was concern for A. fib with RVR for which cardiology was consulted..  Clinical Impression  Pt is not ready or willing to participate with PT.  She appears to have been bedbound for an extended period based on extension contracture.  Will sign off at this time.      Follow Up Recommendations No PT follow up;Other (comment)(restorative nursing.  pt resistant to therapy.)    Equipment Recommendations  None recommended by PT    Recommendations for Other Services       Precautions / Restrictions Precautions Precautions: Fall      Mobility  Bed Mobility Overal bed mobility: Needs Assistance Bed Mobility: Rolling;Supine to Sit;Sit to Supine Rolling: Max assist   Supine to sit: Total assist Sit to supine: Total assist   General bed mobility comments: pt's spine was so stiff as to not be able to sit up at EOB.  Pt also resistant to attempting to sit upright.  Transfers                 General transfer comment: Not attempt due to pt refusing  Ambulation/Gait             General Gait Details: Unable  Stairs            Wheelchair Mobility    Modified Rankin (Stroke Patients Only)       Balance Overall balance assessment: Needs assistance   Sitting balance-Leahy Scale: Zero Sitting balance - Comments: pt unable  to attain upright sitting whether from contracture and or resistant to sitting/.                                     Pertinent Vitals/Pain Pain Assessment: Faces Faces Pain Scale: Hurts little more Pain Location: vague, general Pain Descriptors / Indicators: Grimacing;Moaning Pain Intervention(s): Limited activity within patient's tolerance;Repositioned    Home Living Family/patient expects to be discharged to:: Skilled nursing facility                      Prior Function Level of Independence: Needs assistance         Comments: pt reports doing all ADL's in the bed and never getting OOB to a w/c.  No family to corroborate, but given the contracture into extension, I con imagine her laying supine all the time.     Hand Dominance        Extremity/Trunk Assessment   Upper Extremity Assessment Upper Extremity Assessment: (decrease ROM at shoulders, o/w generally weak bil)    Lower Extremity Assessment Lower Extremity Assessment: RLE deficits/detail;LLE deficits/detail RLE Deficits / Details: pf contracture, knee flexion to ~70* and painful due to quad shortening, grossly 3/5 hip  flexors, quads, in a limited range. RLE Coordination: decreased gross motor;decreased fine motor LLE Deficits / Details: see R LE LLE Coordination: decreased fine motor;decreased  gross motor       Communication   Communication: No difficulties  Cognition Arousal/Alertness: Awake/alert Behavior During Therapy: Anxious Overall Cognitive Status: History of cognitive impairments - at baseline                                        General Comments General comments (skin integrity, edema, etc.): pt stated that she was not willing to try any therapy at this time, maybe in the future.    Exercises Other Exercises Other Exercises: Attempt to complete ROM led to resistance from pt due to discomfort.   Assessment/Plan    PT Assessment Patent does not need  any further PT services  PT Problem List         PT Treatment Interventions      PT Goals (Current goals can be found in the Care Plan section)  Acute Rehab PT Goals Patient Stated Goal: not stated. PT Goal Formulation: Patient unable to participate in goal setting Potential to Achieve Goals: Poor    Frequency     Barriers to discharge        Co-evaluation               AM-PAC PT "6 Clicks" Mobility  Outcome Measure Help needed turning from your back to your side while in a flat bed without using bedrails?: Total Help needed moving from lying on your back to sitting on the side of a flat bed without using bedrails?: Total Help needed moving to and from a bed to a chair (including a wheelchair)?: Total Help needed standing up from a chair using your arms (e.g., wheelchair or bedside chair)?: Total Help needed to walk in hospital room?: Total Help needed climbing 3-5 steps with a railing? : Total 6 Click Score: 6    End of Session   Activity Tolerance: Patient limited by pain;Other (comment)(pt resistant to movement need to work toward sitting EOB) Patient left: in bed;with call bell/phone within reach;with bed alarm set Nurse Communication: Mobility status PT Visit Diagnosis: Muscle weakness (generalized) (M62.81)    Time: 1610-9604 PT Time Calculation (min) (ACUTE ONLY): 21 min   Charges:   PT Evaluation $PT Eval Moderate Complexity: 1 Mod          03/05/2018  Donnella Sham, PT Acute Rehabilitation Services [phone]  (pager) [phone]  (office)  Tessie Fass Mirielle Byrum 03/05/2018, 5:01 PM

## 2018-03-05 NOTE — Progress Notes (Addendum)
Patient ID: Ana Newman, female   DOB: [birth year], 83 y.o.   MRN: [id number]  PROGRESS NOTE    Ana Newman  FAO:[id number] DOB: 1935/07/30 DOA: 03/03/2018 PCP: Patient, No Pcp Per   Brief Narrative:  83 y.o. female with medical history significant of dementia, diabetes mellitus type 2, hypertension, hyperlipidemia, renal/ureteral stones requiring cystoscopy and stent placement and removal in the past, nursing home resident was sent from nursing home for evaluation of fever.  She was found to have acute pyonephritis from left UVJ stone and hydroureteronephrosis.  Urology was consulted.  She underwent cystoscopy and bilateral double-J stents placement by urology on 03/03/2018.  Postprocedure, there was concern for A. fib with RVR for which cardiology was consulted.  Assessment & Plan:   Principal Problem:   Pyelonephritis Active Problems:   Hypothyroidism   Dyslipidemia associated with type 2 diabetes mellitus (HCC)   Diabetes mellitus, type II, insulin dependent ([place])   Sepsis ([place])   AKI (acute kidney injury) ([place])   Leukocytosis   Ureterolithiasis   Sepsis from bacteremia and acute pyelonephritis -Antibiotics plan as below.  DC IV fluids.  Follow cultures and identification. -Hemodynamically improving.  Gram-negative rod bacteremia -Continue antibiotics.  Follow identification and sensitivity  Acute left-sided pyelonephritis with obstructing left UVJ stone causing left hydroureteronephrosis -She underwent cystoscopy and bilateral double-J stents placement by urology on 03/03/2018.  Continue broad-spectrum antibiotics. Urology following.  Will need outpatient urology follow-up. -Urine culture is growing gram-negative rods.  Identification pending.  Postprocedure tachycardia -Question of A. fib with RVR.  Cardiology evaluation appreciated.  Required Cardizem postprocedure.  Currently rate is controlled.  Monitor.  No further cardiology work-up.  Leukocytosis -Secondary to  above.  Improving.  Monitor  Acute kidney injury -Most likely prerenal from above.  Resolved.  DC IV fluids.  Hypokalemia -Improved with replacement  Hypomagnesemia -Replace.  Repeat a.m. labs  Alzheimer's dementia without behavioral disturbance -Monitor mental status.  Fall precautions.  Diabetes mellitus type 2, insulin-dependent -CBGs with SSI.  Blood sugars on the lower side.  Hypertension  -Antihypertensives on hold.  Blood pressure starting to creep up.  Will resume home antihypertensives.  Dyslipidemia  -continue Lipitor   DVT prophylaxis: We will start Lovenox Code Status: DNR Family Communication: None at bedside Disposition Plan: SNF in 1 to 2 days if clinically improves  Consultants: Urology/cardiology  Procedures:  cystoscopy and bilateral double-J stents placement by urology on 03/03/2018.    Antimicrobials: Rocephin on 03/03/2018 Cefepime from 03/04/2018 onwards   Subjective: Patient seen and examined at bedside.  She is a poor historian, pleasantly confused.  Denies worsening abdominal pain.  No overnight fever or vomiting. Objective: Vitals:   03/05/18 0000 03/05/18 0230 03/05/18 0438 03/05/18 0516  BP:  (!) 161/70  (!) 152/82  Pulse: 94 92  92  Resp: (!) 22 (!) 25  18  Temp:   98.6 F (37 C)   TempSrc:   Oral   SpO2: 93% 97%  97%  Weight:    73.5 kg  Height:        Intake/Output Summary (Last 24 hours) at 03/05/2018 1028 Last data filed at 03/05/2018 0436 Gross per 24 hour  Intake 120 ml  Output 2775 ml  Net -2655 ml   Filed Weights   03/03/18 1311 03/04/18 0500 03/05/18 0516  Weight: 68.5 kg 69.7 kg 73.5 kg    Examination:  General exam: Elderly female lying in bed, awake but pleasantly confused.  No acute distress Respiratory system: Bilateral  decreased breath sounds at bases, some scattered crackles Cardiovascular system: S1 & S2 heard, Rate controlled Gastrointestinal system: Abdomen is nondistended, soft and nontender. Normal  bowel sounds heard. Extremities: No cyanosis; trace edema    Data Reviewed: I have personally reviewed following labs and imaging studies  CBC: Recent Labs  Lab 03/03/18 1327 03/04/18 0045 03/05/18 0513  WBC 16.1* 14.0* 12.2*  NEUTROABS 14.8*  --  8.5*  HGB 11.3* 11.3* 10.5*  HCT 34.9* 34.1* 32.6*  MCV 97.2 96.1 96.4  PLT 209 183 683   Basic Metabolic Panel: Recent Labs  Lab 03/03/18 1327 03/04/18 0045 03/05/18 0513  NA 133* 138 135  K 3.3* 3.0* 4.0  CL 99 106 110  CO2 15* 16* 16*  GLUCOSE 176* 98 113*  BUN 73* 64* 29*  CREATININE 2.67* 2.21* 0.93  CALCIUM 7.8* 7.7* 7.9*  MG  --  1.7 1.5*   GFR: Estimated Creatinine Clearance: 45.3 mL/min (by C-G formula based on SCr of 0.93 mg/dL). Liver Function Tests: Recent Labs  Lab 03/03/18 1327  AST 20  ALT 18  ALKPHOS 72  BILITOT 1.2  PROT 6.6  ALBUMIN 2.4*   Recent Labs  Lab 03/03/18 1327  LIPASE 23   No results for input(s): AMMONIA in the last 168 hours. Coagulation Profile: No results for input(s): INR, PROTIME in the last 168 hours. Cardiac Enzymes: No results for input(s): CKTOTAL, CKMB, CKMBINDEX, TROPONINI in the last 168 hours. BNP (last 3 results) No results for input(s): PROBNP in the last 8760 hours. HbA1C: No results for input(s): HGBA1C in the last 72 hours. CBG: Recent Labs  Lab 03/04/18 1733 03/04/18 2011 03/04/18 2342 03/05/18 0439 03/05/18 0826  GLUCAP 76 128* 129* 95 91   Lipid Profile: No results for input(s): CHOL, HDL, LDLCALC, TRIG, CHOLHDL, LDLDIRECT in the last 72 hours. Thyroid Function Tests: Recent Labs    03/04/18 0045  TSH 1.042   Anemia Panel: No results for input(s): VITAMINB12, FOLATE, FERRITIN, TIBC, IRON, RETICCTPCT in the last 72 hours. Sepsis Labs: Recent Labs  Lab 03/03/18 1327 03/03/18 1527  LATICACIDVEN 1.7 1.0    Recent Results (from the past 240 hour(s))  Blood Culture (routine x 2)     Status: Abnormal (Preliminary result)   Collection Time:  03/03/18  1:20 PM  Result Value Ref Range Status   Specimen Description BLOOD LEFT FOREARM  Final   Special Requests   Final    BOTTLES DRAWN AEROBIC AND ANAEROBIC Blood Culture adequate volume   Culture  Setup Time   Final    GRAM NEGATIVE RODS ANAEROBIC BOTTLE ONLY CRITICAL RESULT CALLED TO, READ BACK BY AND VERIFIED WITHDenton Brick [facility] [id number] 03/04/18 A BROWNING Performed at [facility], [place] [address] [place], [place] [address]    Culture PROVIDENCIA STUARTII (A)  Final   Report Status PENDING  Incomplete  Urine culture     Status: Abnormal (Preliminary result)   Collection Time: 03/03/18  1:28 PM  Result Value Ref Range Status   Specimen Description URINE, RANDOM  Final   Special Requests NONE  Final   Culture (A)  Final    >=100,000 COLONIES/mL GRAM NEGATIVE RODS CULTURE REINCUBATED FOR BETTER GROWTH Performed at [facility], [address] [place], [place] [address]    Report Status PENDING  Incomplete  Blood Culture (routine x 2)     Status: Abnormal (Preliminary result)   Collection Time: 03/03/18  1:33 PM  Result Value Ref Range Status  Specimen Description BLOOD RIGHT WRIST  Final   Special Requests   Final    BOTTLES DRAWN AEROBIC AND ANAEROBIC Blood Culture adequate volume Performed at [facility], [place] [address] [place], [place] [address]    Culture  Setup Time   Final    GRAM NEGATIVE RODS IN BOTH AEROBIC AND ANAEROBIC BOTTLES CRITICAL RESULT CALLED TO, READ BACK BY AND VERIFIED WITH: Denton Brick PHARMD [id number] 03/04/18 A BROWNING    Culture PROVIDENCIA STUARTII (A)  Final   Report Status PENDING  Incomplete  Blood Culture ID Panel (Reflexed)     Status: None   Collection Time: 03/03/18  1:33 PM  Result Value Ref Range Status   Enterococcus species NOT DETECTED NOT DETECTED Final   Listeria monocytogenes NOT DETECTED NOT DETECTED Final   Staphylococcus species NOT DETECTED NOT DETECTED Final   Staphylococcus aureus (BCID) NOT DETECTED NOT  DETECTED Final   Streptococcus species NOT DETECTED NOT DETECTED Final   Streptococcus agalactiae NOT DETECTED NOT DETECTED Final   Streptococcus pneumoniae NOT DETECTED NOT DETECTED Final   Streptococcus pyogenes NOT DETECTED NOT DETECTED Final   Acinetobacter baumannii NOT DETECTED NOT DETECTED Final   Enterobacteriaceae species NOT DETECTED NOT DETECTED Final   Enterobacter cloacae complex NOT DETECTED NOT DETECTED Final   Escherichia coli NOT DETECTED NOT DETECTED Final   Klebsiella oxytoca NOT DETECTED NOT DETECTED Final   Klebsiella pneumoniae NOT DETECTED NOT DETECTED Final   Proteus species NOT DETECTED NOT DETECTED Final   Serratia marcescens NOT DETECTED NOT DETECTED Final   Haemophilus influenzae NOT DETECTED NOT DETECTED Final   Neisseria meningitidis NOT DETECTED NOT DETECTED Final   Pseudomonas aeruginosa NOT DETECTED NOT DETECTED Final   Candida albicans NOT DETECTED NOT DETECTED Final   Candida glabrata NOT DETECTED NOT DETECTED Final   Candida krusei NOT DETECTED NOT DETECTED Final   Candida parapsilosis NOT DETECTED NOT DETECTED Final   Candida tropicalis NOT DETECTED NOT DETECTED Final    Comment: Performed at [facility], [address], [place], [place] [address]  MRSA PCR Screening     Status: None   Collection Time: 03/04/18  3:34 AM  Result Value Ref Range Status   MRSA by PCR NEGATIVE NEGATIVE Final    Comment:        The GeneXpert MRSA Assay (FDA approved for NASAL specimens only), is one component of a comprehensive MRSA colonization surveillance program. It is not intended to diagnose MRSA infection nor to guide or monitor treatment for MRSA infections. Performed at [facility], [place] [address], [place], [place] [address]          Radiology Studies: Ct Abdomen Pelvis Wo Contrast  Result Date: 03/03/2018 CLINICAL DATA:  Abdominal distension. EXAM: CT ABDOMEN AND PELVIS WITHOUT CONTRAST TECHNIQUE: Multidetector CT imaging of the  abdomen and pelvis was performed following the standard protocol without IV contrast. COMPARISON:  05/27/2015 FINDINGS: Lower chest: Small dependent pleural effusions with mild dependent basilar atelectasis. Lung bases are largely clear. Hepatobiliary: Gallbladder is distended. No calcified gallstones. No liver parenchymal abnormality seen. Pancreas: Negative Spleen: Negative Adrenals/Urinary Tract: No adrenal mass. No stones in the right kidney is self. There are 2 or 3 stones in the extrarenal pelvis on the right, apparently not causing obstruction presently. The remainder of the right ureter is normal. The left kidney contains multiple nonobstructing stones. There are a few stones in the renal collecting system and renal pelvis. The ureter is markedly dilated  all the way to the bladder where there is a UVJ stone measuring 7 8 mm in size. No stone in the bladder. Small bladder diverticulum on the left. Stomach/Bowel: No acute abnormal bowel finding. No evidence of ileus or obstruction. No inflammatory change seen. Some wall thickening in the sigmoid region could be due to mild chronic diverticulosis. Vascular/Lymphatic: Aortic atherosclerosis. No aneurysm. IVC is normal. No retroperitoneal adenopathy. Reproductive: Previous hysterectomy. No pelvic mass. Other: No free fluid or air. Musculoskeletal: Previous lumbosacral fusion. No acute bone finding. IMPRESSION: 1. 7-8 mm stone at the left UVJ with marked left hydroureteronephrosis. Multiple nonobstructing stones in the left kidney, renal collecting system and extrarenal pelvis. 2. Two or 3 stones in the extrarenal pelvis on the right, apparently not causing obstruction presently. 3. Small bilateral pleural effusions with mild dependent basilar atelectasis. 4. Distended gallbladder without calcified gallstones. 5. Wall thickening of the sigmoid colon without a masslike configuration. This could relate to chronic diverticulosis. Aortic Atherosclerosis (ICD10-I70.0).  Electronically Signed   By: Nelson Chimes M.D.   On: 03/03/2018 15:23   Dg Chest Port 1 View  Result Date: 03/03/2018 CLINICAL DATA:  generalized abdominal pain, n/v x 4 days. fever EXAM: PORTABLE CHEST 1 VIEW COMPARISON:  09/12/2015 FINDINGS: Cardiac silhouette is normal in size. No mediastinal or hilar masses. No evidence of adenopathy. Mild linear opacity in the left mid lung consistent with scarring or atelectasis. Lungs otherwise clear. No pleural effusion or pneumothorax. Skeletal structures are grossly intact. IMPRESSION: No active disease. Electronically Signed   By: Lajean Manes M.D.   On: 03/03/2018 13:54   Dg C-arm 1-60 Min-no Report  Result Date: 03/04/2018 Fluoroscopy was utilized by the requesting physician.  No radiographic interpretation.        Scheduled Meds: . insulin aspart  0-15 Units Subcutaneous TID WC  . insulin aspart  0-5 Units Subcutaneous QHS  . ketorolac  15 mg Intravenous Once  . mouth rinse  15 mL Mouth Rinse BID   Continuous Infusions: . sodium chloride 75 mL/hr at 03/04/18 1002  . sodium chloride    . ceFEPime (MAXIPIME) IV 1 g (03/04/18 0811)  . diltiazem (CARDIZEM) infusion Stopped (03/04/18 0104)     LOS: 2 days        Ana August, MD [facility] Pager [phone]  If 7PM-7AM, please contact night-coverage [email or web address] Password [facility] 03/05/2018, 10:28 AM

## 2018-03-06 ENCOUNTER — Encounter (HOSPITAL_COMMUNITY): Payer: Self-pay | Admitting: Urology

## 2018-03-06 DIAGNOSIS — N110 Nonobstructive reflux-associated chronic pyelonephritis: Secondary | ICD-10-CM | POA: Diagnosis not present

## 2018-03-06 DIAGNOSIS — R6520 Severe sepsis without septic shock: Secondary | ICD-10-CM | POA: Diagnosis not present

## 2018-03-06 DIAGNOSIS — N179 Acute kidney failure, unspecified: Secondary | ICD-10-CM | POA: Diagnosis not present

## 2018-03-06 DIAGNOSIS — N201 Calculus of ureter: Secondary | ICD-10-CM | POA: Diagnosis not present

## 2018-03-06 DIAGNOSIS — N10 Acute tubulo-interstitial nephritis: Secondary | ICD-10-CM | POA: Diagnosis present

## 2018-03-06 DIAGNOSIS — E785 Hyperlipidemia, unspecified: Secondary | ICD-10-CM | POA: Diagnosis not present

## 2018-03-06 DIAGNOSIS — N319 Neuromuscular dysfunction of bladder, unspecified: Secondary | ICD-10-CM | POA: Diagnosis present

## 2018-03-06 DIAGNOSIS — D72829 Elevated white blood cell count, unspecified: Secondary | ICD-10-CM | POA: Diagnosis not present

## 2018-03-06 DIAGNOSIS — R52 Pain, unspecified: Secondary | ICD-10-CM | POA: Diagnosis not present

## 2018-03-06 DIAGNOSIS — Z960 Presence of urogenital implants: Secondary | ICD-10-CM | POA: Diagnosis present

## 2018-03-06 DIAGNOSIS — R531 Weakness: Secondary | ICD-10-CM | POA: Diagnosis not present

## 2018-03-06 DIAGNOSIS — A419 Sepsis, unspecified organism: Secondary | ICD-10-CM | POA: Diagnosis present

## 2018-03-06 DIAGNOSIS — Z794 Long term (current) use of insulin: Secondary | ICD-10-CM | POA: Diagnosis not present

## 2018-03-06 DIAGNOSIS — R488 Other symbolic dysfunctions: Secondary | ICD-10-CM | POA: Diagnosis present

## 2018-03-06 DIAGNOSIS — N12 Tubulo-interstitial nephritis, not specified as acute or chronic: Secondary | ICD-10-CM | POA: Diagnosis not present

## 2018-03-06 DIAGNOSIS — Z7401 Bed confinement status: Secondary | ICD-10-CM | POA: Diagnosis not present

## 2018-03-06 DIAGNOSIS — N200 Calculus of kidney: Secondary | ICD-10-CM | POA: Diagnosis not present

## 2018-03-06 DIAGNOSIS — E1169 Type 2 diabetes mellitus with other specified complication: Secondary | ICD-10-CM | POA: Diagnosis not present

## 2018-03-06 DIAGNOSIS — M2550 Pain in unspecified joint: Secondary | ICD-10-CM | POA: Diagnosis not present

## 2018-03-06 DIAGNOSIS — F329 Major depressive disorder, single episode, unspecified: Secondary | ICD-10-CM | POA: Diagnosis not present

## 2018-03-06 DIAGNOSIS — K5904 Chronic idiopathic constipation: Secondary | ICD-10-CM | POA: Diagnosis not present

## 2018-03-06 DIAGNOSIS — E1159 Type 2 diabetes mellitus with other circulatory complications: Secondary | ICD-10-CM | POA: Diagnosis not present

## 2018-03-06 DIAGNOSIS — E119 Type 2 diabetes mellitus without complications: Secondary | ICD-10-CM | POA: Diagnosis not present

## 2018-03-06 DIAGNOSIS — G309 Alzheimer's disease, unspecified: Secondary | ICD-10-CM | POA: Diagnosis present

## 2018-03-06 DIAGNOSIS — N1330 Unspecified hydronephrosis: Secondary | ICD-10-CM | POA: Diagnosis not present

## 2018-03-06 DIAGNOSIS — I10 Essential (primary) hypertension: Secondary | ICD-10-CM | POA: Diagnosis not present

## 2018-03-06 DIAGNOSIS — N390 Urinary tract infection, site not specified: Secondary | ICD-10-CM | POA: Diagnosis not present

## 2018-03-06 LAB — URINE CULTURE: Culture: >=100000 — AB

## 2018-03-06 LAB — CULTURE, BLOOD (ROUTINE X 2)
Special Requests: ADEQUATE
Special Requests: ADEQUATE

## 2018-03-06 LAB — BASIC METABOLIC PANEL
ANION GAP: 6 (ref 5–15)
BUN: 24 mg/dL — ABNORMAL HIGH (ref 8–23)
CO2: 20 mmol/L — ABNORMAL LOW (ref 22–32)
Calcium: 8.1 mg/dL — ABNORMAL LOW (ref 8.9–10.3)
Chloride: 113 mmol/L — ABNORMAL HIGH (ref 98–111)
Creatinine, Ser: 0.76 mg/dL (ref 0.44–1.00)
GFR calc Af Amer: >60 mL/min (ref >60)
GFR calc non Af Amer: >60 mL/min (ref >60)
Glucose, Bld: 128 mg/dL — ABNORMAL HIGH (ref 70–99)
Potassium: 4.4 mmol/L (ref 3.5–5.1)
Sodium: 139 mmol/L (ref 135–145)

## 2018-03-06 LAB — CBC WITH DIFFERENTIAL/PLATELET
Abs Immature Granulocytes: 0.31 10*3/uL — ABNORMAL HIGH (ref 0.00–0.07)
Basophils Absolute: 0 10*3/uL (ref 0.0–0.1)
Basophils Relative: 0 %
Eosinophils Absolute: 0.1 10*3/uL (ref 0.0–0.5)
Eosinophils Relative: 1 %
HEMATOCRIT: 34.9 % — AB (ref 36.0–46.0)
Hemoglobin: 11.5 g/dL — ABNORMAL LOW (ref 12.0–15.0)
Immature Granulocytes: 3 %
Lymphocytes Relative: 26 %
Lymphs Abs: 2.4 10*3/uL (ref 0.7–4.0)
MCH: 31.4 pg (ref 26.0–34.0)
MCHC: 33 g/dL (ref 30.0–36.0)
MCV: 95.4 fL (ref 80.0–100.0)
Monocytes Absolute: 1.2 10*3/uL — ABNORMAL HIGH (ref 0.1–1.0)
Monocytes Relative: 13 %
Neutro Abs: 5.3 10*3/uL (ref 1.7–7.7)
Neutrophils Relative %: 57 %
Platelets: 197 10*3/uL (ref 150–400)
RBC: 3.66 MIL/uL — ABNORMAL LOW (ref 3.87–5.11)
RDW: 13 % (ref 11.5–15.5)
WBC: 9.2 10*3/uL (ref 4.0–10.5)
nRBC: 0 % (ref 0.0–0.2)

## 2018-03-06 LAB — GLUCOSE, CAPILLARY
GLUCOSE-CAPILLARY: 113 mg/dL — AB (ref 70–99)
GLUCOSE-CAPILLARY: 118 mg/dL — AB (ref 70–99)
Glucose-Capillary: 148 mg/dL — ABNORMAL HIGH (ref 70–99)

## 2018-03-06 LAB — MAGNESIUM: Magnesium: 1.8 mg/dL (ref 1.7–2.4)

## 2018-03-06 MED ORDER — OXYCODONE-ACETAMINOPHEN 5-325 MG PO TABS: 1.0000 | ORAL_TABLET | Freq: Four times a day (QID) | ORAL | 0 refills | Status: DC | PRN

## 2018-03-06 MED ORDER — CEFPODOXIME PROXETIL 200 MG PO TABS: 200.0000 mg | ORAL_TABLET | Freq: Two times a day (BID) | ORAL | 0 refills | Status: AC

## 2018-03-06 MED ORDER — SODIUM CHLORIDE 0.9 % IV SOLN
1.0000 g | Freq: Two times a day (BID) | INTRAVENOUS | Status: DC
  Filled 2018-03-06: qty 1

## 2018-03-06 MED ORDER — INSULIN ASPART 100 UNIT/ML ~~LOC~~ SOLN: 0.0000 [IU] | Freq: Three times a day (TID) | SUBCUTANEOUS | Status: DC

## 2018-03-06 NOTE — Clinical Social Work Note (Signed)
Clinical Social Work Assessment  Patient Details  Name: Ana Newman MRN: [id number] Date of Birth: Jan 30, 1936  Date of referral:  03/06/18               Reason for consult:  Facility Placement                Permission sought to share information with:  Facility Sport and exercise psychologist, Family Supports Permission granted to share information::  Yes, Verbal Permission Granted  Name::        Agency::  [employer]  Relationship::     Contact Information:     Housing/Transportation Living arrangements for the past 2 months:  [place] of Information:  Patient Patient Interpreter Needed:  None Criminal Activity/Legal Involvement Pertinent to Current Situation/Hospitalization:  No - Comment as needed Significant Relationships:  Adult Children, Spouse Lives with:  Facility Resident Do you feel safe going back to the place where you live?  Yes Need for family participation in patient care:  No (Coment)  Care giving concerns:  CSW received consult for possible SNF placement at time of discharge. CSW spoke with patient regarding discharge plan. She reports that she will return to [place] at discharge. CSW to continue to follow and assist with discharge planning needs.   Social Worker assessment / plan:  CSW spoke with patient concerning possibility of rehab at [facility] before returning home.  Employment status:  Retired Forensic scientist:  Medicare PT Recommendations:  No Follow Up Information / Referral to community resources:  [place]  Patient/Family's Response to care:  Patient reports agreement with discharge back to [place].   Patient/Family's Understanding of and Emotional Response to Diagnosis, Current Treatment, and Prognosis:  Patient/family is realistic regarding therapy needs and expressed being hopeful for return to SNF placement. Patient expressed understanding of CSW role and discharge process as well as medical  condition. No questions/concerns about plan or treatment.    Emotional Assessment Appearance:  Appears stated age Attitude/Demeanor/Rapport:  Gracious Affect (typically observed):  Accepting, Appropriate Orientation:  Oriented to Self, Oriented to Place, Oriented to  Time, Oriented to Situation Alcohol / Substance use:  Not Applicable Psych involvement (Current and /or in the community):  No (Comment)  Discharge Needs  Concerns to be addressed:  Care Coordination Readmission within the last 30 days:  No Current discharge risk:  None Barriers to Discharge:  No Barriers Identified   Benard Halsted, LCSW 03/06/2018, 12:51 PM

## 2018-03-06 NOTE — Discharge Summary (Signed)
Physician Discharge Summary  Ana Newman LOV:[id number] DOB: 1936-01-22 DOA: 03/03/2018  PCP: Patient, No Pcp Per  Admit date: 03/03/2018 Discharge date: 03/06/2018  Admitted From: SNF Disposition:  SNF  Recommendations for Outpatient Follow-up:  1. Follow up with SNF provider at earliest convenience with repeat CBC/BMP in the next 2 days 2. Follow-up with urology as an outpatient 3. Follow-up in the ED if symptoms worsen or new appear   Home Health: No Equipment/Devices: None  Discharge Condition: Stable CODE STATUS: DNR Diet recommendation: Heart Healthy / Carb Modified   Brief/Interim Summary: 83 y.o.femalewith medical history significant ofdementia, diabetes mellitus type 2, hypertension, hyperlipidemia, renal/ureteral stones requiring cystoscopy and stent placement and removal in the past, nursing home resident was sent from nursing home for evaluation of fever.  She was found to have acute pyonephritis from left UVJ stone and hydroureteronephrosis.  Urology was consulted.  She underwent cystoscopy and bilateral double-J stents placement by urology on 03/03/2018.  Postprocedure, there was concern for A. fib with RVR for which cardiology was consulted.  Her condition has much improved.  Blood culture is growing Providencia stuartii and urine culture is growing Providencia stuartii along with Proteus mirabilis.  She is afebrile and leukocytosis is resolved.  She will be discharged on oral antibiotics.   Discharge Diagnoses:  Principal Problem:   Pyelonephritis Active Problems:   Hypothyroidism   Dyslipidemia associated with type 2 diabetes mellitus (HCC)   Diabetes mellitus, type II, insulin dependent ([place])   Sepsis ([place])   AKI (acute kidney injury) ([place])   Leukocytosis   Ureterolithiasis  Sepsis from bacteremia and acute pyelonephritis -Antibiotics plan as below.    Treated with IV fluids, subsequently discontinued. -Hemodynamically stable. -Sepsis has  resolved  Providencia stuartii bacteremia -From UTI.  Currently on IV cefepime.    Afebrile.  Leukocytosis resolved.  Will discharge on oral Vantin for 10 days.  Acute left-sided pyelonephritis with obstructing left UVJ stone causing left hydroureteronephrosis -She underwent cystoscopy and bilateral double-J stents placement by urology on 03/03/2018.   -Antibiotic plan as above. Will need outpatient urology follow-up. -Urine culture is growing Providencia stuartii and Proteus mirabilis.  Sensitivities for Proteus mirabilis is pending but I suspect that it should be sensitive to Vantin.  Postprocedure tachycardia -Question of A. fib with RVR.  Cardiology evaluation appreciated.  Required Cardizem postprocedure.  Currently rate is controlled.  Monitor.  No further cardiology work-up.  Leukocytosis -Secondary to above.  Resolved.  Acute kidney injury -Most likely prerenal from above.  Resolved.    Off IV fluids.  Outpatient follow-up  Hypokalemia -Improved with replacement  Hypomagnesemia -Replaced.    Improved.  Alzheimer's dementia without behavioral disturbance -Monitor mental status. Fall precautions.  Mental status stable for now.  Diabetes mellitus type 2, insulin-dependent -Blood sugars on the lower side.  We will keep long-acting insulin on hold.  Continue sliding scale coverage for now.  Hypertension -Antihypertensives held initially.  Blood pressure on the higher side.  Resume antihypertensives on discharge  Dyslipidemia  -continue Lipitor  Discharge Instructions  Discharge Instructions    Ambulatory referral to Urology   Complete by:  As directed    Call MD for:  difficulty breathing, headache or visual disturbances   Complete by:  As directed    Call MD for:  extreme fatigue   Complete by:  As directed    Call MD for:  hives   Complete by:  As directed    Call MD for:  persistant dizziness or  light-headedness   Complete by:  As directed    Call MD  for:  persistant nausea and vomiting   Complete by:  As directed    Call MD for:  severe uncontrolled pain   Complete by:  As directed    Call MD for:  temperature >100.4   Complete by:  As directed    Diet - low sodium heart healthy   Complete by:  As directed    Diet Carb Modified   Complete by:  As directed    Increase activity slowly   Complete by:  As directed      Allergies as of 03/06/2018   No Known Allergies     Medication List    STOP taking these medications   LEVEMIR FLEXPEN 100 UNIT/ML Pen Generic drug:  Insulin Detemir   nitrofurantoin (macrocrystal-monohydrate) 100 MG capsule Commonly known as:  MACROBID     TAKE these medications   acetaminophen 500 MG tablet Commonly known as:  TYLENOL Take 1,000 mg by mouth daily. Pain management   acetaminophen 325 MG tablet Commonly known as:  TYLENOL Take 650 mg by mouth every 6 (six) hours as needed for moderate pain.   aspirin 325 MG tablet Take 325 mg by mouth daily.   atorvastatin 10 MG tablet Commonly known as:  LIPITOR Take 10 mg by mouth daily at 6 PM.   bisacodyl 10 MG suppository Commonly known as:  DULCOLAX Place 10 mg rectally as needed for moderate constipation.   cefpodoxime 200 MG tablet Commonly known as:  VANTIN Take 1 tablet (200 mg total) by mouth 2 (two) times daily for 10 days.   cholecalciferol 1000 units tablet Commonly known as:  VITAMIN D Take 2,000 Units by mouth daily.   docusate sodium 100 MG capsule Commonly known as:  COLACE Take 100 mg by mouth 2 (two) times daily.   FISH OIL OMEGA-3 PO Give 1 capsule by mouth every morning   FRESHKOTE 2.7-2 % Soln Generic drug:  Polyvinyl Alcohol-Povidone Apply 1 drop to eye 3 (three) times daily. To both eyes   insulin aspart 100 UNIT/ML injection Commonly known as:  novoLOG Inject 0-15 Units into the skin 3 (three) times daily with meals.   LINZESS 72 MCG capsule Generic drug:  linaclotide Take 72 mcg by mouth daily.    lisinopril 5 MG tablet Commonly known as:  PRINIVIL,ZESTRIL Take 5 mg by mouth daily.   metoprolol tartrate 25 MG tablet Commonly known as:  LOPRESSOR Take 25 mg by mouth 2 (two) times daily. For HTN  Hold if B/P < 100/60 or HR < 60   multivitamin tablet Take 1 tablet by mouth daily. Wound healing   NUTRITIONAL SUPPLEMENT PO Regular Diet - Regular texture, Regular consistency   ondansetron 4 MG tablet Commonly known as:  ZOFRAN Take 4 mg by mouth every 6 (six) hours as needed for nausea or vomiting.   oxyCODONE-acetaminophen 5-325 MG tablet Commonly known as:  PERCOCET/ROXICET Take 1 tablet by mouth every 6 (six) hours as needed for severe pain. What changed:    when to take this  reasons to take this   polyethylene glycol packet Commonly known as:  MIRALAX / GLYCOLAX Take 17 g by mouth every 12 (twelve) hours as needed for mild constipation.      Follow-up Information    Franchot Gallo, MD. Schedule an appointment as soon as possible for a visit in 1 week(s).   Specialty:  Urology Contact information: [place]  [place] [address] [phone]        PCP. Schedule an appointment as soon as possible for a visit in 1 week(s).   Why:  with cbc/bmp         No Known Allergies  Consultations:  Urology   Procedures/Studies: Ct Abdomen Pelvis Wo Contrast  Result Date: 03/03/2018 CLINICAL DATA:  Abdominal distension. EXAM: CT ABDOMEN AND PELVIS WITHOUT CONTRAST TECHNIQUE: Multidetector CT imaging of the abdomen and pelvis was performed following the standard protocol without IV contrast. COMPARISON:  05/27/2015 FINDINGS: Lower chest: Small dependent pleural effusions with mild dependent basilar atelectasis. Lung bases are largely clear. Hepatobiliary: Gallbladder is distended. No calcified gallstones. No liver parenchymal abnormality seen. Pancreas: Negative Spleen: Negative Adrenals/Urinary Tract: No adrenal mass. No stones in the right kidney is self.  There are 2 or 3 stones in the extrarenal pelvis on the right, apparently not causing obstruction presently. The remainder of the right ureter is normal. The left kidney contains multiple nonobstructing stones. There are a few stones in the renal collecting system and renal pelvis. The ureter is markedly dilated all the way to the bladder where there is a UVJ stone measuring 7 8 mm in size. No stone in the bladder. Small bladder diverticulum on the left. Stomach/Bowel: No acute abnormal bowel finding. No evidence of ileus or obstruction. No inflammatory change seen. Some wall thickening in the sigmoid region could be due to mild chronic diverticulosis. Vascular/Lymphatic: Aortic atherosclerosis. No aneurysm. IVC is normal. No retroperitoneal adenopathy. Reproductive: Previous hysterectomy. No pelvic mass. Other: No free fluid or air. Musculoskeletal: Previous lumbosacral fusion. No acute bone finding. IMPRESSION: 1. 7-8 mm stone at the left UVJ with marked left hydroureteronephrosis. Multiple nonobstructing stones in the left kidney, renal collecting system and extrarenal pelvis. 2. Two or 3 stones in the extrarenal pelvis on the right, apparently not causing obstruction presently. 3. Small bilateral pleural effusions with mild dependent basilar atelectasis. 4. Distended gallbladder without calcified gallstones. 5. Wall thickening of the sigmoid colon without a masslike configuration. This could relate to chronic diverticulosis. Aortic Atherosclerosis (ICD10-I70.0). Electronically Signed   By: Nelson Chimes M.D.   On: 03/03/2018 15:23   Dg Chest Port 1 View  Result Date: 03/03/2018 CLINICAL DATA:  generalized abdominal pain, n/v x 4 days. fever EXAM: PORTABLE CHEST 1 VIEW COMPARISON:  09/12/2015 FINDINGS: Cardiac silhouette is normal in size. No mediastinal or hilar masses. No evidence of adenopathy. Mild linear opacity in the left mid lung consistent with scarring or atelectasis. Lungs otherwise clear. No  pleural effusion or pneumothorax. Skeletal structures are grossly intact. IMPRESSION: No active disease. Electronically Signed   By: Lajean Manes M.D.   On: 03/03/2018 13:54   Dg C-arm 1-60 Min-no Report  Result Date: 03/04/2018 Fluoroscopy was utilized by the requesting physician.  No radiographic interpretation.     Subjective: Patient seen and examined at bedside.  She is awake and pleasantly confused.  Denies worsening fever, nausea, vomiting.  Tolerating diet as per nursing staff.  Discharge Exam: Vitals:   03/05/18 2330 03/06/18 0426  BP:  (!) 143/95  Pulse:  78  Resp:    Temp: 98.2 F (36.8 C) 98.2 F (36.8 C)  SpO2:  97%   Vitals:   03/05/18 0516 03/05/18 1948 03/05/18 2330 03/06/18 0426  BP: (!) 152/82 (!) 158/72  (!) 143/95  Pulse: 92   78  Resp: 18     Temp:  98.6 F (37 C) 98.2 F (36.8 C) 98.2 F (  36.8 C)  TempSrc:  Oral Oral Oral  SpO2: 97%   97%  Weight: 73.5 kg     Height:        General: Pt is awake, pleasantly confused.  No distress Cardiovascular: rate controlled, S1/S2 + Respiratory: bilateral decreased breath sounds at bases Abdominal: Soft, NT, ND, bowel sounds + Extremities: Trace edema, no cyanosis    The results of significant diagnostics from this hospitalization (including imaging, microbiology, ancillary and laboratory) are listed below for reference.     Microbiology: Recent Results (from the past 240 hour(s))  Blood Culture (routine x 2)     Status: Abnormal   Collection Time: 03/03/18  1:20 PM  Result Value Ref Range Status   Specimen Description BLOOD LEFT FOREARM  Final   Special Requests   Final    BOTTLES DRAWN AEROBIC AND ANAEROBIC Blood Culture adequate volume   Culture  Setup Time   Final    GRAM NEGATIVE RODS ANAEROBIC BOTTLE ONLY CRITICAL RESULT CALLED TO, READ BACK BY AND VERIFIED WITH: Denton Brick PHARMD [id number] 03/04/18 A BROWNING    Culture (A)  Final    PROVIDENCIA STUARTII SUSCEPTIBILITIES PERFORMED ON PREVIOUS  CULTURE WITHIN THE LAST 5 DAYS. Performed at [facility], [place] [address], [place], [place] [address]    Report Status 03/06/2018 FINAL  Final  Urine culture     Status: Abnormal (Preliminary result)   Collection Time: 03/03/18  1:28 PM  Result Value Ref Range Status   Specimen Description URINE, RANDOM  Final   Special Requests   Final    NONE Performed at [facility], [place] [address], [place], [place] [address]    Culture (A)  Final    >=100,000 COLONIES/mL PROVIDENCIA STUARTII >=100,000 COLONIES/mL PROTEUS MIRABILIS    Report Status PENDING  Incomplete  Blood Culture (routine x 2)     Status: Abnormal   Collection Time: 03/03/18  1:33 PM  Result Value Ref Range Status   Specimen Description BLOOD RIGHT WRIST  Final   Special Requests   Final    BOTTLES DRAWN AEROBIC AND ANAEROBIC Blood Culture adequate volume Performed at [facility], [place] [address] [place], [place] [address]    Culture  Setup Time   Final    GRAM NEGATIVE RODS IN BOTH AEROBIC AND ANAEROBIC BOTTLES CRITICAL RESULT CALLED TO, READ BACK BY AND VERIFIED WITH: Denton Brick PHARMD [id number] 03/04/18 A BROWNING    Culture PROVIDENCIA STUARTII (A)  Final   Report Status 03/06/2018 FINAL  Final   Organism ID, Bacteria PROVIDENCIA STUARTII  Final      Susceptibility   Providencia stuartii - MIC*    AMPICILLIN >=32 RESISTANT Resistant     CEFAZOLIN >=64 RESISTANT Resistant     CEFEPIME <=1 SENSITIVE Sensitive     CEFTAZIDIME <=1 SENSITIVE Sensitive     CEFTRIAXONE 2 SENSITIVE Sensitive     CIPROFLOXACIN >=4 RESISTANT Resistant     GENTAMICIN RESISTANT Resistant     IMIPENEM 2 SENSITIVE Sensitive     TRIMETH/SULFA <=20 SENSITIVE Sensitive     AMPICILLIN/SULBACTAM >=32 RESISTANT Resistant     PIP/TAZO <=4 SENSITIVE Sensitive     * PROVIDENCIA STUARTII  Blood Culture ID Panel (Reflexed)     Status: None   Collection Time: 03/03/18  1:33 PM  Result Value Ref Range Status   Enterococcus species NOT  DETECTED NOT DETECTED Final   Listeria monocytogenes NOT DETECTED NOT DETECTED Final   Staphylococcus species  NOT DETECTED NOT DETECTED Final   Staphylococcus aureus (BCID) NOT DETECTED NOT DETECTED Final   Streptococcus species NOT DETECTED NOT DETECTED Final   Streptococcus agalactiae NOT DETECTED NOT DETECTED Final   Streptococcus pneumoniae NOT DETECTED NOT DETECTED Final   Streptococcus pyogenes NOT DETECTED NOT DETECTED Final   Acinetobacter baumannii NOT DETECTED NOT DETECTED Final   Enterobacteriaceae species NOT DETECTED NOT DETECTED Final   Enterobacter cloacae complex NOT DETECTED NOT DETECTED Final   Escherichia coli NOT DETECTED NOT DETECTED Final   Klebsiella oxytoca NOT DETECTED NOT DETECTED Final   Klebsiella pneumoniae NOT DETECTED NOT DETECTED Final   Proteus species NOT DETECTED NOT DETECTED Final   Serratia marcescens NOT DETECTED NOT DETECTED Final   Haemophilus influenzae NOT DETECTED NOT DETECTED Final   Neisseria meningitidis NOT DETECTED NOT DETECTED Final   Pseudomonas aeruginosa NOT DETECTED NOT DETECTED Final   Candida albicans NOT DETECTED NOT DETECTED Final   Candida glabrata NOT DETECTED NOT DETECTED Final   Candida krusei NOT DETECTED NOT DETECTED Final   Candida parapsilosis NOT DETECTED NOT DETECTED Final   Candida tropicalis NOT DETECTED NOT DETECTED Final    Comment: Performed at [facility], [place] [address] [place], [place] [address]  MRSA PCR Screening     Status: None   Collection Time: 03/04/18  3:34 AM  Result Value Ref Range Status   MRSA by PCR NEGATIVE NEGATIVE Final    Comment:        The GeneXpert MRSA Assay (FDA approved for NASAL specimens only), is one component of a comprehensive MRSA colonization surveillance program. It is not intended to diagnose MRSA infection nor to guide or monitor treatment for MRSA infections. Performed at [facility], [place] [address], [place],  [address]      Labs: BNP (last 3  results) No results for input(s): BNP in the last 8760 hours. Basic Metabolic Panel: Recent Labs  Lab 03/03/18 1327 03/04/18 0045 03/05/18 0513 03/06/18 0819  NA 133* 138 135 139  K 3.3* 3.0* 4.0 4.4  CL 99 106 110 113*  CO2 15* 16* 16* 20*  GLUCOSE 176* 98 113* 128*  BUN 73* 64* 29* 24*  CREATININE 2.67* 2.21* 0.93 0.76  CALCIUM 7.8* 7.7* 7.9* 8.1*  MG  --  1.7 1.5* 1.8   Liver Function Tests: Recent Labs  Lab 03/03/18 1327  AST 20  ALT 18  ALKPHOS 72  BILITOT 1.2  PROT 6.6  ALBUMIN 2.4*   Recent Labs  Lab 03/03/18 1327  LIPASE 23   No results for input(s): AMMONIA in the last 168 hours. CBC: Recent Labs  Lab 03/03/18 1327 03/04/18 0045 03/05/18 0513 03/06/18 0819  WBC 16.1* 14.0* 12.2* 9.2  NEUTROABS 14.8*  --  8.5* 5.3  HGB 11.3* 11.3* 10.5* 11.5*  HCT 34.9* 34.1* 32.6* 34.9*  MCV 97.2 96.1 96.4 95.4  PLT 209 183 180 197   Cardiac Enzymes: No results for input(s): CKTOTAL, CKMB, CKMBINDEX, TROPONINI in the last 168 hours. BNP: Invalid input(s): POCBNP CBG: Recent Labs  Lab 03/05/18 0826 03/05/18 1230 03/05/18 1741 03/05/18 2348 03/06/18 0805  GLUCAP 91 138* 122* 133* 118*   D-Dimer No results for input(s): DDIMER in the last 72 hours. Hgb A1c No results for input(s): HGBA1C in the last 72 hours. Lipid Profile No results for input(s): CHOL, HDL, LDLCALC, TRIG, CHOLHDL, LDLDIRECT in the last 72 hours. Thyroid function studies Recent Labs    03/04/18 0045  TSH 1.042  Anemia work up No results for input(s): VITAMINB12, FOLATE, FERRITIN, TIBC, IRON, RETICCTPCT in the last 72 hours. Urinalysis    Component Value Date/Time   COLORURINE YELLOW 03/03/2018 1328   APPEARANCEUR TURBID (A) 03/03/2018 1328   LABSPEC 1.017 03/03/2018 1328   PHURINE 7.0 03/03/2018 1328   GLUCOSEU NEGATIVE 03/03/2018 1328   HGBUR LARGE (A) 03/03/2018 1328   BILIRUBINUR NEGATIVE 03/03/2018 1328   KETONESUR 20 (A) 03/03/2018 1328   PROTEINUR 100 (A) 03/03/2018  1328   NITRITE NEGATIVE 03/03/2018 1328   LEUKOCYTESUR MODERATE (A) 03/03/2018 1328   Sepsis Labs Invalid input(s): PROCALCITONIN,  WBC,  LACTICIDVEN Microbiology Recent Results (from the past 240 hour(s))  Blood Culture (routine x 2)     Status: Abnormal   Collection Time: 03/03/18  1:20 PM  Result Value Ref Range Status   Specimen Description BLOOD LEFT FOREARM  Final   Special Requests   Final    BOTTLES DRAWN AEROBIC AND ANAEROBIC Blood Culture adequate volume   Culture  Setup Time   Final    GRAM NEGATIVE RODS ANAEROBIC BOTTLE ONLY CRITICAL RESULT CALLED TO, READ BACK BY AND VERIFIED WITH: Denton Brick PHARMD [id number] 03/04/18 A BROWNING    Culture (A)  Final    PROVIDENCIA STUARTII SUSCEPTIBILITIES PERFORMED ON PREVIOUS CULTURE WITHIN THE LAST 5 DAYS. Performed at [facility], [place] [address], [place], [place] [address]    Report Status 03/06/2018 FINAL  Final  Urine culture     Status: Abnormal (Preliminary result)   Collection Time: 03/03/18  1:28 PM  Result Value Ref Range Status   Specimen Description URINE, RANDOM  Final   Special Requests   Final    NONE Performed at [facility], [place] [address] [place], [place] [address]    Culture (A)  Final    >=100,000 COLONIES/mL PROVIDENCIA STUARTII >=100,000 COLONIES/mL PROTEUS MIRABILIS    Report Status PENDING  Incomplete  Blood Culture (routine x 2)     Status: Abnormal   Collection Time: 03/03/18  1:33 PM  Result Value Ref Range Status   Specimen Description BLOOD RIGHT WRIST  Final   Special Requests   Final    BOTTLES DRAWN AEROBIC AND ANAEROBIC Blood Culture adequate volume Performed at [facility], [place] [address] [place],  [address]    Culture  Setup Time   Final    GRAM NEGATIVE RODS IN BOTH AEROBIC AND ANAEROBIC BOTTLES CRITICAL RESULT CALLED TO, READ BACK BY AND VERIFIED WITH: Denton Brick PHARMD [id number] 03/04/18 A BROWNING    Culture PROVIDENCIA STUARTII (A)  Final   Report Status 03/06/2018  FINAL  Final   Organism ID, Bacteria PROVIDENCIA STUARTII  Final      Susceptibility   Providencia stuartii - MIC*    AMPICILLIN >=32 RESISTANT Resistant     CEFAZOLIN >=64 RESISTANT Resistant     CEFEPIME <=1 SENSITIVE Sensitive     CEFTAZIDIME <=1 SENSITIVE Sensitive     CEFTRIAXONE 2 SENSITIVE Sensitive     CIPROFLOXACIN >=4 RESISTANT Resistant     GENTAMICIN RESISTANT Resistant     IMIPENEM 2 SENSITIVE Sensitive     TRIMETH/SULFA <=20 SENSITIVE Sensitive     AMPICILLIN/SULBACTAM >=32 RESISTANT Resistant     PIP/TAZO <=4 SENSITIVE Sensitive     * PROVIDENCIA STUARTII  Blood Culture ID Panel (Reflexed)     Status: None   Collection Time: 03/03/18  1:33 PM  Result Value Ref Range Status   Enterococcus species  NOT DETECTED NOT DETECTED Final   Listeria monocytogenes NOT DETECTED NOT DETECTED Final   Staphylococcus species NOT DETECTED NOT DETECTED Final   Staphylococcus aureus (BCID) NOT DETECTED NOT DETECTED Final   Streptococcus species NOT DETECTED NOT DETECTED Final   Streptococcus agalactiae NOT DETECTED NOT DETECTED Final   Streptococcus pneumoniae NOT DETECTED NOT DETECTED Final   Streptococcus pyogenes NOT DETECTED NOT DETECTED Final   Acinetobacter baumannii NOT DETECTED NOT DETECTED Final   Enterobacteriaceae species NOT DETECTED NOT DETECTED Final   Enterobacter cloacae complex NOT DETECTED NOT DETECTED Final   Escherichia coli NOT DETECTED NOT DETECTED Final   Klebsiella oxytoca NOT DETECTED NOT DETECTED Final   Klebsiella pneumoniae NOT DETECTED NOT DETECTED Final   Proteus species NOT DETECTED NOT DETECTED Final   Serratia marcescens NOT DETECTED NOT DETECTED Final   Haemophilus influenzae NOT DETECTED NOT DETECTED Final   Neisseria meningitidis NOT DETECTED NOT DETECTED Final   Pseudomonas aeruginosa NOT DETECTED NOT DETECTED Final   Candida albicans NOT DETECTED NOT DETECTED Final   Candida glabrata NOT DETECTED NOT DETECTED Final   Candida krusei NOT DETECTED  NOT DETECTED Final   Candida parapsilosis NOT DETECTED NOT DETECTED Final   Candida tropicalis NOT DETECTED NOT DETECTED Final    Comment: Performed at [facility], [address] [place], [place] [address]  MRSA PCR Screening     Status: None   Collection Time: 03/04/18  3:34 AM  Result Value Ref Range Status   MRSA by PCR NEGATIVE NEGATIVE Final    Comment:        The GeneXpert MRSA Assay (FDA approved for NASAL specimens only), is one component of a comprehensive MRSA colonization surveillance program. It is not intended to diagnose MRSA infection nor to guide or monitor treatment for MRSA infections. Performed at [facility], [place] [address] [place],  [address]      Time coordinating discharge: 35 minutes  SIGNED:   [name], MD  [facility] 03/06/2018, 9:54 AM Pager: [phone]  If 7PM-7AM, please contact night-coverage [email or web address] Password [email or web address]

## 2018-03-06 NOTE — Progress Notes (Signed)
Patient will DC to: [place] Anticipated DC date: 03/06/2018 Family notified: Patient declined Transport by: PTAR 1:30pm   Per MD patient ready for DC to [facility]. RN, patient, patient's family, and facility notified of DC. Discharge Summary and FL2 sent to facility. RN to call report prior to discharge ([phone]). DC packet on chart. Ambulance transport requested for patient.   CSW will sign off for now as social work intervention is no longer needed. Please consult [place] again if new needs arise.  Cedric Fishman, LCSW Clinical Social Worker [phone]

## 2018-03-06 NOTE — NC FL2 (Signed)
[place] MEDICAID FL2 LEVEL OF CARE SCREENING TOOL     IDENTIFICATION  Patient Name: Ana Newman Birthdate: November 14, 1935 Sex: female Admission Date (Current Location): 03/03/2018  [facility] and [place] Number:  Herbalist and Address:  [facility] [place], [place] [address] [place], [place] [address]      Provider Number: [id number]  Attending Physician Name and Address:  [name], MD  Relative Name and Phone Number:  [name] [phone]    Current Level of Care: Hospital Recommended Level of Care: [place] Prior Approval Number:    Date Approved/Denied:   PASRR Number:    Discharge Plan: SNF    Current Diagnoses: Patient Active Problem List   Diagnosis Date Noted  . Pyelonephritis 03/03/2018  . Leukocytosis 03/03/2018  . Ureterolithiasis 03/03/2018  . Late onset Alzheimer's disease without behavioral disturbance ([place]) 09/18/2017  . Major depression with psychotic features ([place]) 03/14/2017  . Weight loss, non-intentional 10/25/2016  . Chronic constipation 10/08/2016  . Hypertension associated with diabetes ([place]) 05/14/2016  . Bladder outlet obstruction 06/17/2015  . Nephrolithiasis 05/18/2015  . Sepsis ([place]) 05/17/2015  . AKI (acute kidney injury) ([place]) 05/17/2015  . Environmental and seasonal allergies 04/06/2015  . Dyslipidemia associated with type 2 diabetes mellitus ([place]) 02/04/2015  . Diabetes mellitus, type II, insulin dependent ([place]) 02/04/2015  . Depression 01/18/2014  . Hypothyroidism 01/18/2014  . Psychosis ([place])   . Osteoarthritis     Orientation RESPIRATION BLADDER Height & Weight     Self, Place, Situation  O2(Nasal cannula 2L) Continent, External catheter(Uretral drain) Weight: 73.5 kg Height:  5\' 2"  (157.5 cm)  BEHAVIORAL SYMPTOMS/MOOD NEUROLOGICAL BOWEL NUTRITION STATUS      Continent Diet(Please see DC Summary)  AMBULATORY STATUS COMMUNICATION OF NEEDS Skin   Limited Assist Verbally  Surgical wounds(Closed incision on vagina)                       Personal Care Assistance Level of Assistance  Bathing, Feeding, Dressing Bathing Assistance: Limited assistance Feeding assistance: Limited assistance Dressing Assistance: Limited assistance     Functional Limitations Info  Sight, Hearing, Speech Sight Info: Adequate Hearing Info: Adequate Speech Info: Adequate    SPECIAL CARE FACTORS FREQUENCY  PT (By licensed PT)     PT Frequency: 3x/week              Contractures Contractures Info: Not present    Additional Factors Info  Code Status, Allergies, Insulin Sliding Scale Code Status Info: DNR Allergies Info: NKA   Insulin Sliding Scale Info: 3x daily with meals and at bedtime       Current Medications (03/06/2018):  This is the current hospital active medication list Current Facility-Administered Medications  Medication Dose Route Frequency Provider Last Rate Last Dose  . 0.9 %  sodium chloride infusion   Intravenous Continuous Franchot Gallo, MD      . acetaminophen (TYLENOL) tablet 650 mg  650 mg Oral Q6H PRN Franchot Gallo, MD       Or  . acetaminophen (TYLENOL) suppository 650 mg  650 mg Rectal Q6H PRN Franchot Gallo, MD      . atorvastatin (LIPITOR) tablet 10 mg  10 mg Oral q1800 [name], MD   10 mg at 03/05/18 1800  . ceFEPIme (MAXIPIME) 1 g in sodium chloride 0.9 % 100 mL IVPB  1 g Intravenous Q12H Alekh, Kshitiz, MD      . diltiazem (CARDIZEM) 100 mg in dextrose 5%  164mL (1 mg/mL) infusion  5-20 mg/hr Intravenous Titrated Charolette Forward, MD   Stopped at 03/04/18 0104  . enoxaparin (LOVENOX) injection 40 mg  40 mg Subcutaneous Q24H Starla Link, Kshitiz, MD   40 mg at 03/06/18 1245  . insulin aspart (novoLOG) injection 0-15 Units  0-15 Units Subcutaneous TID WC Franchot Gallo, MD   2 Units at 03/06/18 1246  . insulin aspart (novoLOG) injection 0-5 Units  0-5 Units Subcutaneous QHS Franchot Gallo, MD      . ketorolac  (TORADOL) 15 MG/ML injection 15 mg  15 mg Intravenous Q6H PRN Franchot Gallo, MD   15 mg at 03/06/18 0959  . ketorolac (TORADOL) 30 MG/ML injection 15 mg  15 mg Intravenous Once Blount, Scarlette Shorts T, NP      . linaclotide (LINZESS) capsule 72 mcg  72 mcg Oral Daily [name], MD   72 mcg at 03/06/18 0948  . MEDLINE mouth rinse  15 mL Mouth Rinse BID [name], MD   15 mL at 03/05/18 2009  . metoprolol tartrate (LOPRESSOR) tablet 25 mg  25 mg Oral BID [name], MD   25 mg at 03/06/18 0948  . ondansetron (ZOFRAN) tablet 4 mg  4 mg Oral Q6H PRN Franchot Gallo, MD       Or  . ondansetron [facility]) injection 4 mg  4 mg Intravenous Q6H PRN Franchot Gallo, MD   4 mg at 03/03/18 1928  . polyethylene glycol (MIRALAX / GLYCOLAX) packet 17 g  17 g Oral Q12H PRN Starla Link, Kshitiz, MD      . polyvinyl alcohol (LIQUIFILM TEARS) 1.4 % ophthalmic solution 1 drop  1 drop Both Eyes TID [name], MD   1 drop at 03/06/18 1000     Discharge Medications: Please see discharge summary for a list of discharge medications.  Relevant Imaging Results:  Relevant Lab Results:   Additional Information SSN: [id number]  Benard Halsted, LCSW

## 2018-03-06 NOTE — Progress Notes (Signed)
Pt discharging to SNF, [facility]. Copy of instructions will be printed and will send with transporters for facility.  Waiting PTAR to arrive to transport pt. Will call facility to give updated report as pt is returning to facility she was at prior to admit to hospital.

## 2018-03-06 NOTE — Progress Notes (Signed)
PTAR arrived, assisted with pt to ready physically for transport to facility. Copy of d/c instructions printed and given to transporters.  Report called to facility [facility]), spoke with staff member Jolane and gave report to her.  Pt d/c'd with her belongings with PTAR.

## 2018-03-08 DIAGNOSIS — N1330 Unspecified hydronephrosis: Secondary | ICD-10-CM | POA: Diagnosis not present

## 2018-03-08 DIAGNOSIS — K5904 Chronic idiopathic constipation: Secondary | ICD-10-CM | POA: Diagnosis not present

## 2018-03-08 DIAGNOSIS — N10 Acute tubulo-interstitial nephritis: Secondary | ICD-10-CM | POA: Diagnosis not present

## 2018-03-08 DIAGNOSIS — A419 Sepsis, unspecified organism: Secondary | ICD-10-CM | POA: Diagnosis not present

## 2018-03-09 DIAGNOSIS — N1330 Unspecified hydronephrosis: Secondary | ICD-10-CM | POA: Diagnosis not present

## 2018-03-09 DIAGNOSIS — F329 Major depressive disorder, single episode, unspecified: Secondary | ICD-10-CM | POA: Diagnosis not present

## 2018-03-09 DIAGNOSIS — A419 Sepsis, unspecified organism: Secondary | ICD-10-CM | POA: Diagnosis not present

## 2018-03-09 DIAGNOSIS — N10 Acute tubulo-interstitial nephritis: Secondary | ICD-10-CM | POA: Diagnosis not present

## 2018-03-09 DIAGNOSIS — E1159 Type 2 diabetes mellitus with other circulatory complications: Secondary | ICD-10-CM | POA: Diagnosis not present

## 2018-03-10 NOTE — Consult Note (Signed)
            [facility] CM Primary Care Navigator  03/10/2018  Angelee Bahr [facility] [birth year] [id number]   Attempt to see patient at the bedside to identify possible discharge needs but she was already discharged.  Per MD note, patient was sent from the nursing home for evaluation of fever and was found to have acute pyonephritis from stone and hydroureteronephrosis, urology was consulted.  Per Inpatient social worker, patient returned back [facility] skilled nursing facility where she has been residing prior to admission. Per discharge instruction, patient will follow up with SNF-skilled nursing facility provider at earliest convenience with repeat CBC/BMP in the next 2 days and follow-up with urology in 1 week.  NofurtherTHN care management needs identifiableatthis point.   For additional questions please contact:  Edwena Felty A. Eugenie Harewood, BSN, RN-BC [facility] PRIMARY CARE Navigator Cell: [phone]

## 2018-03-14 DIAGNOSIS — N110 Nonobstructive reflux-associated chronic pyelonephritis: Secondary | ICD-10-CM | POA: Diagnosis not present

## 2018-03-14 DIAGNOSIS — N200 Calculus of kidney: Secondary | ICD-10-CM | POA: Diagnosis not present

## 2018-03-16 DIAGNOSIS — F329 Major depressive disorder, single episode, unspecified: Secondary | ICD-10-CM | POA: Diagnosis not present

## 2018-03-22 ENCOUNTER — Other Ambulatory Visit: Payer: Self-pay | Admitting: Urology

## 2018-03-22 DIAGNOSIS — F329 Major depressive disorder, single episode, unspecified: Secondary | ICD-10-CM | POA: Diagnosis not present

## 2018-03-22 NOTE — Progress Notes (Signed)
Spoke to [place], Personal assistant phone and requested for the nurse of the patient to Fax to [place]- most recent progress note, MAR, any labs within last 2 weeks, EKG and CXR within last year, If she has a POA and that face sheet with her POA information.

## 2018-03-28 ENCOUNTER — Encounter (HOSPITAL_COMMUNITY): Payer: Self-pay | Admitting: *Deleted

## 2018-03-28 DIAGNOSIS — F329 Major depressive disorder, single episode, unspecified: Secondary | ICD-10-CM | POA: Diagnosis not present

## 2018-03-29 DIAGNOSIS — N1330 Unspecified hydronephrosis: Secondary | ICD-10-CM | POA: Diagnosis not present

## 2018-03-29 DIAGNOSIS — E1159 Type 2 diabetes mellitus with other circulatory complications: Secondary | ICD-10-CM | POA: Diagnosis not present

## 2018-03-29 DIAGNOSIS — N10 Acute tubulo-interstitial nephritis: Secondary | ICD-10-CM | POA: Diagnosis not present

## 2018-03-29 DIAGNOSIS — K5904 Chronic idiopathic constipation: Secondary | ICD-10-CM | POA: Diagnosis not present

## 2018-03-29 NOTE — Progress Notes (Addendum)
Preop instructions for: Rickita Forstner                         Date of Birth : [birth year]                        Date of Procedure: 04/03/2018       Doctor: DR Franchot Gallo  Time to arrive at [facility]: [id number] Report to: Admitting  Procedure: cystoscopy, with retrograde, ureteroscopy and stent placement  Any procedure time changes, MD office will notify you!   Do not eat or drink past midnight the night before your procedure.(To include any tube feedings-must be discontinued)    Take these morning medications only with sips of water.(or give through gastrostomy or feeding tube). Metoprolol ( Lopressor) if applicable, Eye drops as usual  Note: No Insulin or Diabetic meds should be given or taken the morning of the procedure!   Facility contact: [employer]                  Phone: [place]:  Transportation contact phone#: [employer]   Please send day of procedure:current med list and meds last taken that day, confirm nothing by mouth status from what time, Patient Demographic info( to include DNR status, problem list, allergies)   RN contact name/phone#: Gillian Shields RN                              and Fax [phone]  [place] card and picture ID Leave all jewelry and other valuables at place where living( no metal or rings to be worn) No contact lens Women-no make-up, no lotions,perfumes,powders   Any questions day of procedure,call Short Stay [phone]    Sent from :[facility]                   [place]                   Fax:[phone]  Sent by :Gillian Shields RN

## 2018-03-31 ENCOUNTER — Encounter (HOSPITAL_COMMUNITY): Payer: Self-pay | Admitting: Physician Assistant

## 2018-03-31 NOTE — Progress Notes (Signed)
Called and spoke to Chaya Jan, daughter and asked if she had POA and will be coming to sign patient's consent form. Chaya Jan, daughter states that the patient signs her own consent and Alyse Low has only Power of Attorney. Chaya Jan states that she will probably come after surgery as she has 2 children to get off to school and she has a job.

## 2018-04-03 ENCOUNTER — Encounter (HOSPITAL_COMMUNITY): Admission: RE | Payer: Self-pay | Source: Home / Self Care

## 2018-04-03 ENCOUNTER — Telehealth (HOSPITAL_COMMUNITY): Payer: Self-pay | Admitting: *Deleted

## 2018-04-03 ENCOUNTER — Ambulatory Visit (HOSPITAL_COMMUNITY): Admission: RE | Admit: 2018-04-03 | Payer: Medicare Other | Source: Home / Self Care | Admitting: Urology

## 2018-04-03 HISTORY — DX: Tubulo-interstitial nephritis, not specified as acute or chronic: N12

## 2018-04-03 HISTORY — DX: Calculus of gallbladder without cholecystitis without obstruction: K80.20

## 2018-04-03 HISTORY — DX: Constipation, unspecified: K59.00

## 2018-04-03 HISTORY — DX: Type 2 diabetes mellitus without complications: E11.9

## 2018-04-03 HISTORY — DX: Hyperlipidemia, unspecified: E78.5

## 2018-04-03 HISTORY — DX: Personal history of urinary calculi: Z87.442

## 2018-04-03 HISTORY — DX: Vitamin D deficiency, unspecified: E55.9

## 2018-04-03 SURGERY — CYSTOURETEROSCOPY, WITH RETROGRADE PYELOGRAM AND STENT INSERTION
Anesthesia: General | Laterality: Bilateral

## 2018-04-06 DIAGNOSIS — K8021 Calculus of gallbladder without cholecystitis with obstruction: Secondary | ICD-10-CM | POA: Diagnosis not present

## 2018-04-06 DIAGNOSIS — E1159 Type 2 diabetes mellitus with other circulatory complications: Secondary | ICD-10-CM | POA: Diagnosis not present

## 2018-04-06 DIAGNOSIS — F329 Major depressive disorder, single episode, unspecified: Secondary | ICD-10-CM | POA: Diagnosis not present

## 2018-04-06 DIAGNOSIS — N10 Acute tubulo-interstitial nephritis: Secondary | ICD-10-CM | POA: Diagnosis not present

## 2018-04-06 DIAGNOSIS — N1330 Unspecified hydronephrosis: Secondary | ICD-10-CM | POA: Diagnosis not present

## 2018-04-10 ENCOUNTER — Ambulatory Visit: Payer: Self-pay | Admitting: Surgery

## 2018-04-10 DIAGNOSIS — Z7401 Bed confinement status: Secondary | ICD-10-CM | POA: Diagnosis not present

## 2018-04-10 DIAGNOSIS — M2550 Pain in unspecified joint: Secondary | ICD-10-CM | POA: Diagnosis not present

## 2018-04-10 DIAGNOSIS — K8020 Calculus of gallbladder without cholecystitis without obstruction: Secondary | ICD-10-CM | POA: Diagnosis not present

## 2018-04-11 DIAGNOSIS — F329 Major depressive disorder, single episode, unspecified: Secondary | ICD-10-CM | POA: Diagnosis not present

## 2018-04-12 NOTE — Progress Notes (Signed)
Called and spoke with Elmyra Ricks, nurse at [facility] who is caregiver for patient.  Proep instructions for 04/13/18 have been received by nurse.  Elmyra Ricks asked if patient could take Percocet with sip of water am of surgery.  Informed nurse that patient could take Percocet as prescribed.

## 2018-04-12 NOTE — Progress Notes (Addendum)
Preop instructions for: Ana Newman                         Date of Birth : 02-08-1935                            Date of Procedure: 04/13/2018       Doctor: Dr Franchot Gallo  Time to arrive at [facility]: 0730am Report to: Admitting  Procedure: Cystoscopy, with retrograde, ureteroscopy and stent placement  Any procedure time changes, MD office will notify you!   Do not eat or drink past midnight the night before your procedure.(To include any tube feedings-must be discontinued)    Take these morning medications only with sips of water.(or give through gastrostomy or feeding tube). Metoprolol ( Lopressor) if applicable Note: No Insulin or Diabetic meds should be given or taken the morning of the procedure!   Facility contact: [employer]                  Phone: [place]:  Transportation contact phone#:[place]   Please send day of procedure:current med list and meds last taken that day, confirm nothing by mouth status from what time, Patient Demographic info( to include DNR status, problem list, allergies)   RN contact name/phone#: Gillian Shields RN                              and Fax #: [phone]  Bring Insurance card and picture ID Leave all jewelry and other valuables at place where living( no metal or rings to be worn) No contact lens Women-no make-up, no lotions,perfumes,powders   Any questions day of procedure,call Short Stay at [phone]     Sent from :[facility]                   [place]                   [phone]  Sent by : Gillian Shields RN

## 2018-04-13 ENCOUNTER — Ambulatory Visit (HOSPITAL_COMMUNITY): Admission: RE | Admit: 2018-04-13 | Payer: Medicare Other | Source: Home / Self Care | Admitting: Urology

## 2018-04-13 ENCOUNTER — Other Ambulatory Visit: Payer: Self-pay | Admitting: Urology

## 2018-04-13 NOTE — Progress Notes (Unsigned)
Urine culture urine culture

## 2018-04-14 DIAGNOSIS — R319 Hematuria, unspecified: Secondary | ICD-10-CM | POA: Diagnosis not present

## 2018-04-14 DIAGNOSIS — N390 Urinary tract infection, site not specified: Secondary | ICD-10-CM | POA: Diagnosis not present

## 2018-04-17 DIAGNOSIS — E1151 Type 2 diabetes mellitus with diabetic peripheral angiopathy without gangrene: Secondary | ICD-10-CM | POA: Diagnosis not present

## 2018-04-17 DIAGNOSIS — B351 Tinea unguium: Secondary | ICD-10-CM | POA: Diagnosis not present

## 2018-04-22 DIAGNOSIS — F329 Major depressive disorder, single episode, unspecified: Secondary | ICD-10-CM | POA: Diagnosis not present

## 2018-04-25 DIAGNOSIS — M810 Age-related osteoporosis without current pathological fracture: Secondary | ICD-10-CM | POA: Diagnosis not present

## 2018-04-25 DIAGNOSIS — I2510 Atherosclerotic heart disease of native coronary artery without angina pectoris: Secondary | ICD-10-CM | POA: Diagnosis not present

## 2018-04-25 DIAGNOSIS — R609 Edema, unspecified: Secondary | ICD-10-CM | POA: Diagnosis not present

## 2018-04-25 DIAGNOSIS — I10 Essential (primary) hypertension: Secondary | ICD-10-CM | POA: Diagnosis not present

## 2018-04-30 DIAGNOSIS — F329 Major depressive disorder, single episode, unspecified: Secondary | ICD-10-CM | POA: Diagnosis not present

## 2018-05-01 ENCOUNTER — Encounter (HOSPITAL_COMMUNITY): Admission: RE | Payer: Self-pay | Source: Home / Self Care

## 2018-05-01 SURGERY — CYSTOURETEROSCOPY, WITH RETROGRADE PYELOGRAM AND STENT INSERTION
Anesthesia: General | Laterality: Bilateral

## 2018-05-03 DIAGNOSIS — R5381 Other malaise: Secondary | ICD-10-CM | POA: Diagnosis not present

## 2018-05-03 DIAGNOSIS — N1330 Unspecified hydronephrosis: Secondary | ICD-10-CM | POA: Diagnosis not present

## 2018-05-03 DIAGNOSIS — N10 Acute tubulo-interstitial nephritis: Secondary | ICD-10-CM | POA: Diagnosis not present

## 2018-05-03 DIAGNOSIS — E1159 Type 2 diabetes mellitus with other circulatory complications: Secondary | ICD-10-CM | POA: Diagnosis not present

## 2018-05-04 DIAGNOSIS — E1159 Type 2 diabetes mellitus with other circulatory complications: Secondary | ICD-10-CM | POA: Diagnosis not present

## 2018-05-04 DIAGNOSIS — E559 Vitamin D deficiency, unspecified: Secondary | ICD-10-CM | POA: Diagnosis not present

## 2018-05-04 DIAGNOSIS — E039 Hypothyroidism, unspecified: Secondary | ICD-10-CM | POA: Diagnosis not present

## 2018-05-04 DIAGNOSIS — D649 Anemia, unspecified: Secondary | ICD-10-CM | POA: Diagnosis not present

## 2018-05-04 DIAGNOSIS — E785 Hyperlipidemia, unspecified: Secondary | ICD-10-CM | POA: Diagnosis not present

## 2018-05-07 DIAGNOSIS — F329 Major depressive disorder, single episode, unspecified: Secondary | ICD-10-CM | POA: Diagnosis not present

## 2018-05-20 DIAGNOSIS — F329 Major depressive disorder, single episode, unspecified: Secondary | ICD-10-CM | POA: Diagnosis not present

## 2018-05-25 DIAGNOSIS — N390 Urinary tract infection, site not specified: Secondary | ICD-10-CM | POA: Diagnosis not present

## 2018-05-25 DIAGNOSIS — R319 Hematuria, unspecified: Secondary | ICD-10-CM | POA: Diagnosis not present

## 2018-05-27 DIAGNOSIS — F329 Major depressive disorder, single episode, unspecified: Secondary | ICD-10-CM | POA: Diagnosis not present

## 2018-06-01 ENCOUNTER — Encounter (HOSPITAL_COMMUNITY): Payer: Self-pay | Admitting: *Deleted

## 2018-06-01 DIAGNOSIS — K5904 Chronic idiopathic constipation: Secondary | ICD-10-CM | POA: Diagnosis not present

## 2018-06-01 DIAGNOSIS — R5381 Other malaise: Secondary | ICD-10-CM | POA: Diagnosis not present

## 2018-06-01 DIAGNOSIS — F329 Major depressive disorder, single episode, unspecified: Secondary | ICD-10-CM | POA: Diagnosis not present

## 2018-06-01 DIAGNOSIS — N1330 Unspecified hydronephrosis: Secondary | ICD-10-CM | POA: Diagnosis not present

## 2018-06-01 DIAGNOSIS — E1159 Type 2 diabetes mellitus with other circulatory complications: Secondary | ICD-10-CM | POA: Diagnosis not present

## 2018-06-01 NOTE — Progress Notes (Addendum)
Preop instructions for: Dorine Duffey                Date of Birth: [birth year]                             Date of Procedure:  06/08/2018       Doctor: Dr Franchot Gallo  Time to arrive at [facility]: 1030am  Report to: Admitting  Procedure:Cystoscopy, stent placement  Any procedure time changes, MD office will notify you!   Do not eat or drink past midnight the night before your procedure.(To include any tube feedings-must be discontinued)    Take these morning medications only with sips of water.(or give through gastrostomy or feeding tube). Metoprolol  Note: No Insulin or Diabetic meds should be given or taken the morning of the procedure!   Facility contact: [employer]                  Phone: [place]:  Transportation contact phone#: [employer]  Please send day of procedure:current med list and meds last taken that day, confirm nothing by mouth status from what time, Patient Demographic info( to include DNR status, problem list, allergies)   RN contact name/phone#:                             and Fax [phone]  [employer] card and picture ID Leave all jewelry and other valuables at place where living( no metal or rings to be worn) No contact lens Women-no make-up, no lotions,perfumes,powders   Any questions day of procedure,call [place]- [phone]   Sent from :[facility]                   [place]                   Fax:[phone]  Sent by :Gillian Shields RN

## 2018-06-02 NOTE — Progress Notes (Signed)
REceived phone call from Elmyra Ricks, nurse of patient at [facility].  Nurse stated that she had received the preop instructions for the patient .  Instructed nurse to call office of Dr Diona Fanti and to inform them .  Phone number given.  PST nurse called and LVMM for Noemi Chapel at [facility] regarding above.

## 2018-06-06 DIAGNOSIS — I10 Essential (primary) hypertension: Secondary | ICD-10-CM | POA: Diagnosis not present

## 2018-06-06 DIAGNOSIS — E1159 Type 2 diabetes mellitus with other circulatory complications: Secondary | ICD-10-CM | POA: Diagnosis not present

## 2018-06-06 DIAGNOSIS — R5381 Other malaise: Secondary | ICD-10-CM | POA: Diagnosis not present

## 2018-06-06 DIAGNOSIS — N1330 Unspecified hydronephrosis: Secondary | ICD-10-CM | POA: Diagnosis not present

## 2018-06-06 NOTE — Progress Notes (Signed)
Spoke with mary nurse at [facility], patient has now agreed to surgery and per nurse mary patient signs own consents. Mary to check with unit manager and call me back at [phone] if poa needs to sign consent. Left voice mail message with selita stating patient has agreed to surgery

## 2018-06-07 NOTE — Anesthesia Preprocedure Evaluation (Addendum)
Anesthesia Evaluation  Patient identified by MRN, date of birth, ID band Patient awake    Reviewed: Allergy & Precautions, NPO status , Patient's Chart, lab work & pertinent test results  Airway Mallampati: II  TM Distance: >3 FB Neck ROM: Full    Dental no notable dental hx.    Pulmonary neg pulmonary ROS,    Pulmonary exam normal breath sounds clear to auscultation       Cardiovascular hypertension, Normal cardiovascular exam Rhythm:Regular Rate:Normal     Neuro/Psych Dementia negative neurological ROS     GI/Hepatic negative GI ROS, Neg liver ROS,   Endo/Other  diabetes  Renal/GU negative Renal ROS  negative genitourinary   Musculoskeletal negative musculoskeletal ROS (+)   Abdominal   Peds negative pediatric ROS (+)  Hematology negative hematology ROS (+)   Anesthesia Other Findings   Reproductive/Obstetrics negative OB ROS                                                             Anesthesia Evaluation  Patient identified by MRN, date of birth, ID band Patient confused    Reviewed: Allergy & Precautions, NPO status , Patient's Chart, lab work & pertinent test results  Airway Mallampati: II   Neck ROM: full    Dental   Pulmonary neg pulmonary ROS,    breath sounds clear to auscultation       Cardiovascular hypertension,  Rhythm:regular Rate:Normal     Neuro/Psych PSYCHIATRIC DISORDERS Anxiety Depression Schizophrenia Dementia    GI/Hepatic GERD  ,  Endo/Other  diabetesHypothyroidism   Renal/GU Renal disease     Musculoskeletal  (+) Arthritis ,   Abdominal   Peds  Hematology   Anesthesia Other Findings   Reproductive/Obstetrics                             Anesthesia Physical Anesthesia Plan  ASA: III and emergent  Anesthesia Plan: General   Post-op Pain Management:    Induction: Intravenous  PONV Risk Score  and Plan: 3 and Ondansetron, Dexamethasone and Treatment may vary due to age or medical condition  Airway Management Planned: LMA  Additional Equipment:   Intra-op Plan:   Post-operative Plan: Extubation in OR  Informed Consent: I have reviewed the patients History and Physical, chart, labs and discussed the procedure including the risks, benefits and alternatives for the proposed anesthesia with the patient or authorized representative who has indicated his/her understanding and acceptance.       Plan Discussed with: CRNA, Anesthesiologist and Surgeon  Anesthesia Plan Comments:         Anesthesia Quick Evaluation  Anesthesia Physical Anesthesia Plan  ASA: III  Anesthesia Plan: General   Post-op Pain Management:    Induction: Intravenous  PONV Risk Score and Plan: 3 and Treatment may vary due to age or medical condition  Airway Management Planned: LMA  Additional Equipment:   Intra-op Plan:   Post-operative Plan: Extubation in OR  Informed Consent: I have reviewed the patients History and Physical, chart, labs and discussed the procedure including the risks, benefits and alternatives for the proposed anesthesia with the patient or authorized representative who has indicated his/her understanding and acceptance.     Dental advisory given  Plan Discussed with:  CRNA  Anesthesia Plan Comments: (See PAT note 06/07/2018, Konrad Felix, PA-C)       Anesthesia Quick Evaluation

## 2018-06-07 NOTE — Progress Notes (Signed)
Anesthesia Chart Review   Case:  [id number] Date/Time:  06/08/18 1215   Procedures:      CYSTOSCOPY WITH RETROGRADE PYELOGRAM, URETEROSCOPY AND STENT PLACEMENT (Bilateral ) - 10 MINS     HOLMIUM LASER APPLICATION (Bilateral )   Anesthesia type:  General   Pre-op diagnosis:  LEFT URETERAL AND RIGHT RENAL PELVIS CALCULUS   Location:  [facility] PROCEDURE [facility]OM / [facility]   Surgeon:  Franchot Gallo, MD      DISCUSSION: 83 yo never smoker with h/o anxiety, depression, schizophrenia, dementia, HTN, hypothyroidism, DM II, GERD, HLD, bilateral ureteral pelvis calculus scheduled for above procedure 06/08/18 with Dr. Franchot Gallo.    Pt with admission 03/03/2018-03/06/2018 due to acute pyelonephritis from left UVJ stone and hydroureteronephrosis. Underwent cystoscopy and bilateral double-J stents 03/03/18.  Post procedure there was concern for a-fib with RVR, cardiology consulted.  Per cardiology note, "upon further reviewing rhythm strips it appears to be marked sinus tachycardia with baseline artifacts." No further cardiac workup recommended.  Stable at baseline.   Pt resides in SNF. Pt can proceed with planned procedure barring acute status change and after evaluation DOS (SDW).  VS: There were no vitals taken for this visit.  PROVIDERS: Patient, No Pcp Per   LABS: Labs DOS (all labs ordered are listed, but only abnormal results are displayed)  Labs Reviewed - No data to display   IMAGES: CT Abdomen Pelvis 03/03/2018 IMPRESSION: 1. 7-8 mm stone at the left UVJ with marked left hydroureteronephrosis. Multiple nonobstructing stones in the left kidney, renal collecting system and extrarenal pelvis. 2. Two or 3 stones in the extrarenal pelvis on the right, apparently not causing obstruction presently. 3. Small bilateral pleural effusions with mild dependent basilar atelectasis. 4. Distended gallbladder without calcified gallstones. 5. Wall thickening of the sigmoid colon without a  masslike configuration. This could relate to chronic diverticulosis.  EKG: 03/06/2018 Rate 111 Sinus tachycardia Atrial premature complexes Consider right atrial enlargement Abnormal R-wave progression, early transition Abnormal inferior Q waves Baseline wander in lead V3  CV:  Past Medical History:  Diagnosis Date  . Acute pyelonephritis   . Alzheimer's disease ([place])    alzheimer's dementia   . Bilateral foot-drop   . Bladder outlet obstruction   . Cholelithiasis   . Constipation   . Depression   . Diabetes mellitus without complication ([place])    type 2   . Diabetic ulcer of right buttock ([place])   . Diarrhea   . Dysrhythmia    ? hx of afib postop 1/31-03/06/18   . Environmental and seasonal allergies   . Essential hypertension, benign   . Foley catheter in place   . GERD (gastroesophageal reflux disease)   . History of kidney stones   . History of urinary tract infection   . Hyperlipidemia   . Hypertension   . Hypothyroidism   . Incontinence    wears diaper   . Insomnia   . Nephrolithiasis   . Osteoarthritis   . Pyelonephritis   . Reflux esophagitis   . Rickets, active   . Sepsis ([place])   . Sepsis ([place])    hx of   . Unspecified constipation   . Unspecified psychosis   . Unspecified vitamin D deficiency   . Vitamin D deficiency     Past Surgical History:  Procedure Laterality Date  . ABDOMINAL HYSTERECTOMY    . CYSTOSCOPY W/ URETERAL STENT PLACEMENT Bilateral 05/18/2015   Procedure: CYSTOSCOPY WITH RETROGRADE PYELOGRAM/URETERAL STENT PLACEMENT;  Surgeon: Pierre Bali  Gaynelle Arabian, MD;  Location: [facility];  Service: Urology;  Laterality: Bilateral;  . CYSTOSCOPY W/ URETERAL STENT PLACEMENT Bilateral 03/03/2018   Procedure: CYSTOSCOPY WITH RETROGRADE PYELOGRAM/URETERAL STENT PLACEMENT;  Surgeon: Franchot Gallo, MD;  Location: [place];  Service: Urology;  Laterality: Bilateral;  . CYSTOSCOPY W/ URETERAL STENT REMOVAL Bilateral 09/12/2015   Procedure: CYSTOSCOPY WITH  STENT REMOVAL;  Surgeon: Carolan Clines, MD;  Location: [facility];  Service: Urology;  Laterality: Bilateral;  1 HOUR  . CYSTOSCOPY WITH RETROGRADE PYELOGRAM, URETEROSCOPY AND STENT PLACEMENT Bilateral 07/18/2015   Procedure: CYSTOSCOPY REMOVE LEFT DOUBLE J STENT LEFT RETROGRADE PYELOGRAM, LEFT URETEROSCOPY, PYELOSCOPY, REPLACEMENT OF LEFT DOUBLE J STENT;  Surgeon: Carolan Clines, MD;  Location[facility];  Service: Urology;  Laterality: Bilateral;  . HOLMIUM LASER APPLICATION Bilateral 6/72/0947   Procedure: HOLMIUM LASER OF LEFT LOWER URETERAL STONE, LEFT PYELOSCOPY, REPLACE LEFT DOUBLE J STENT REMOVE RIGHT DOUBLE J STENT RIGHT RETROGRADE PYELOGRAM, REPLACEMENT RIGHT DOUBLE J STENT ;  Surgeon: Carolan Clines, MD;  Location: [facility];  Service: Urology;  Laterality: Bilateral;  . KNEE ARTHROSCOPY Left 1997    MEDICATIONS: No current facility-administered medications for this encounter.    Marland Kitchen acetaminophen (TYLENOL) 500 MG tablet  . aspirin 81 MG tablet  . atorvastatin (LIPITOR) 10 MG tablet  . bisacodyl (DULCOLAX) 10 MG suppository  . Cholecalciferol (VITAMIN D3) 50 MCG (2000 UT) TABS  . Cranberry 450 MG CAPS  . docusate sodium (COLACE) 100 MG capsule  . Ensure (ENSURE)  . insulin aspart (NOVOLOG) 100 UNIT/ML injection  . linaclotide (LINZESS) 72 MCG capsule  . lisinopril (PRINIVIL,ZESTRIL) 5 MG tablet  . metoprolol tartrate (LOPRESSOR) 25 MG tablet  . Multiple Vitamin (MULTIVITAMIN WITH MINERALS) TABS tablet  . Nutritional Supplements (PROMOD) LIQD  . ondansetron (ZOFRAN) 4 MG tablet  . oxyCODONE-acetaminophen (PERCOCET/ROXICET) 5-325 MG tablet  . polyethylene glycol (MIRALAX / GLYCOLAX) packet  . Polyvinyl Alcohol-Povidone (FRESHKOTE) 2.7-2 % SOLN  . senna (SENOKOT) 8.6 MG tablet    Maia Plan [facility] Pre-Surgical Testing [phone] 06/07/18 2:58 PM

## 2018-06-08 ENCOUNTER — Ambulatory Visit (HOSPITAL_COMMUNITY): Payer: Medicare Other | Admitting: Physician Assistant

## 2018-06-08 ENCOUNTER — Encounter (HOSPITAL_COMMUNITY)
Admission: RE | Disposition: A | Discharge status: Skilled Nursing Facility | Payer: Self-pay | Source: Home / Self Care | Attending: Urology

## 2018-06-08 ENCOUNTER — Ambulatory Visit (HOSPITAL_COMMUNITY): Payer: Medicare Other

## 2018-06-08 ENCOUNTER — Encounter (HOSPITAL_COMMUNITY): Payer: Self-pay | Admitting: General Practice

## 2018-06-08 ENCOUNTER — Ambulatory Visit (HOSPITAL_COMMUNITY)
Admission: RE | Admit: 2018-06-08 | Discharge: 2018-06-08 | Disposition: A | Discharge status: Skilled Nursing Facility | Payer: Medicare Other | Attending: Urology | Admitting: Urology

## 2018-06-08 DIAGNOSIS — Z794 Long term (current) use of insulin: Secondary | ICD-10-CM | POA: Diagnosis not present

## 2018-06-08 DIAGNOSIS — F0280 Dementia in other diseases classified elsewhere without behavioral disturbance: Secondary | ICD-10-CM | POA: Insufficient documentation

## 2018-06-08 DIAGNOSIS — Z79899 Other long term (current) drug therapy: Secondary | ICD-10-CM | POA: Diagnosis not present

## 2018-06-08 DIAGNOSIS — I10 Essential (primary) hypertension: Secondary | ICD-10-CM | POA: Insufficient documentation

## 2018-06-08 DIAGNOSIS — G309 Alzheimer's disease, unspecified: Secondary | ICD-10-CM | POA: Insufficient documentation

## 2018-06-08 DIAGNOSIS — E785 Hyperlipidemia, unspecified: Secondary | ICD-10-CM | POA: Diagnosis not present

## 2018-06-08 DIAGNOSIS — Z7401 Bed confinement status: Secondary | ICD-10-CM | POA: Diagnosis not present

## 2018-06-08 DIAGNOSIS — M2550 Pain in unspecified joint: Secondary | ICD-10-CM | POA: Diagnosis not present

## 2018-06-08 DIAGNOSIS — N202 Calculus of kidney with calculus of ureter: Secondary | ICD-10-CM | POA: Insufficient documentation

## 2018-06-08 DIAGNOSIS — F329 Major depressive disorder, single episode, unspecified: Secondary | ICD-10-CM | POA: Insufficient documentation

## 2018-06-08 DIAGNOSIS — E039 Hypothyroidism, unspecified: Secondary | ICD-10-CM | POA: Diagnosis not present

## 2018-06-08 DIAGNOSIS — R410 Disorientation, unspecified: Secondary | ICD-10-CM | POA: Diagnosis not present

## 2018-06-08 DIAGNOSIS — K219 Gastro-esophageal reflux disease without esophagitis: Secondary | ICD-10-CM | POA: Diagnosis not present

## 2018-06-08 DIAGNOSIS — E559 Vitamin D deficiency, unspecified: Secondary | ICD-10-CM | POA: Insufficient documentation

## 2018-06-08 DIAGNOSIS — Z7982 Long term (current) use of aspirin: Secondary | ICD-10-CM | POA: Insufficient documentation

## 2018-06-08 DIAGNOSIS — N201 Calculus of ureter: Secondary | ICD-10-CM | POA: Diagnosis not present

## 2018-06-08 DIAGNOSIS — R5381 Other malaise: Secondary | ICD-10-CM | POA: Diagnosis not present

## 2018-06-08 DIAGNOSIS — I959 Hypotension, unspecified: Secondary | ICD-10-CM | POA: Diagnosis not present

## 2018-06-08 DIAGNOSIS — M1990 Unspecified osteoarthritis, unspecified site: Secondary | ICD-10-CM | POA: Diagnosis not present

## 2018-06-08 DIAGNOSIS — N179 Acute kidney failure, unspecified: Secondary | ICD-10-CM | POA: Diagnosis not present

## 2018-06-08 HISTORY — DX: Foot drop, left foot: M21.372

## 2018-06-08 HISTORY — DX: Cardiac arrhythmia, unspecified: I49.9

## 2018-06-08 HISTORY — PX: CYSTOSCOPY WITH RETROGRADE PYELOGRAM, URETEROSCOPY AND STENT PLACEMENT: SHX5789

## 2018-06-08 HISTORY — DX: Foot drop, left foot: M21.371

## 2018-06-08 HISTORY — DX: Unspecified urinary incontinence: R32

## 2018-06-08 HISTORY — PX: HOLMIUM LASER APPLICATION: SHX5852

## 2018-06-08 HISTORY — DX: Non-pressure chronic ulcer of buttock with unspecified severity: L98.419

## 2018-06-08 HISTORY — DX: Type 2 diabetes mellitus with other skin ulcer: E11.622

## 2018-06-08 LAB — BASIC METABOLIC PANEL
Anion gap: 8 (ref 5–15)
BUN: 64 mg/dL — ABNORMAL HIGH (ref 8–23)
CO2: 17 mmol/L — ABNORMAL LOW (ref 22–32)
Calcium: 9.2 mg/dL (ref 8.9–10.3)
Chloride: 107 mmol/L (ref 98–111)
Creatinine, Ser: 1.14 mg/dL — ABNORMAL HIGH (ref 0.44–1.00)
GFR calc Af Amer: 52 mL/min — ABNORMAL LOW (ref >60)
GFR calc non Af Amer: 45 mL/min — ABNORMAL LOW (ref >60)
Glucose, Bld: 106 mg/dL — ABNORMAL HIGH (ref 70–99)
Potassium: 6.1 mmol/L — ABNORMAL HIGH (ref 3.5–5.1)
Sodium: 132 mmol/L — ABNORMAL LOW (ref 135–145)

## 2018-06-08 LAB — CBC
HCT: 37.9 % (ref 36.0–46.0)
Hemoglobin: 11.8 g/dL — ABNORMAL LOW (ref 12.0–15.0)
MCH: 31.1 pg (ref 26.0–34.0)
MCHC: 31.1 g/dL (ref 30.0–36.0)
MCV: 100 fL (ref 80.0–100.0)
Platelets: 365 10*3/uL (ref 150–400)
RBC: 3.79 MIL/uL — ABNORMAL LOW (ref 3.87–5.11)
RDW: 13.4 % (ref 11.5–15.5)
WBC: 10.5 10*3/uL (ref 4.0–10.5)
nRBC: 0 % (ref 0.0–0.2)

## 2018-06-08 LAB — GLUCOSE, CAPILLARY
Glucose-Capillary: 96 mg/dL (ref 70–99)
Glucose-Capillary: 101 mg/dL — ABNORMAL HIGH (ref 70–99)

## 2018-06-08 LAB — URINALYSIS, ROUTINE W REFLEX MICROSCOPIC
Bilirubin Urine: NEGATIVE
Glucose, UA: NEGATIVE mg/dL
Ketones, ur: NEGATIVE mg/dL
Nitrite: NEGATIVE
Protein, ur: 30 mg/dL — AB
RBC / HPF: >50 RBC/hpf — ABNORMAL HIGH (ref 0–5)
Specific Gravity, Urine: 1.011 (ref 1.005–1.030)
WBC, UA: >50 WBC/hpf — ABNORMAL HIGH (ref 0–5)
pH: 5 (ref 5.0–8.0)

## 2018-06-08 SURGERY — CYSTOURETEROSCOPY, WITH RETROGRADE PYELOGRAM AND STENT INSERTION
Anesthesia: General | Laterality: Bilateral

## 2018-06-08 MED ORDER — IOHEXOL 300 MG/ML  SOLN
INTRAMUSCULAR | Status: DC | PRN
  Administered 2018-06-08: 14:00:00 8 mL via URETHRAL

## 2018-06-08 MED ORDER — LIDOCAINE HCL (CARDIAC) PF 100 MG/5ML IV SOSY
PREFILLED_SYRINGE | INTRAVENOUS | Status: DC | PRN
  Administered 2018-06-08: 50 mg via INTRAVENOUS

## 2018-06-08 MED ORDER — CEFAZOLIN SODIUM-DEXTROSE 2-4 GM/100ML-% IV SOLN
2.0000 g | INTRAVENOUS | Status: AC
  Administered 2018-06-08: 2 g via INTRAVENOUS
  Filled 2018-06-08: qty 100

## 2018-06-08 MED ORDER — PROPOFOL 10 MG/ML IV BOLUS
INTRAVENOUS | Status: DC | PRN
  Administered 2018-06-08: 80 mg via INTRAVENOUS

## 2018-06-08 MED ORDER — EPHEDRINE SULFATE 50 MG/ML IJ SOLN
INTRAMUSCULAR | Status: DC | PRN
  Administered 2018-06-08: 20 mg via INTRAVENOUS
  Administered 2018-06-08: 10 mg via INTRAVENOUS

## 2018-06-08 MED ORDER — PHENYLEPHRINE HCL (PRESSORS) 10 MG/ML IV SOLN
INTRAVENOUS | Status: DC | PRN
  Administered 2018-06-08: 40 ug via INTRAVENOUS
  Administered 2018-06-08: 80 ug via INTRAVENOUS

## 2018-06-08 MED ORDER — OXYCODONE HCL 5 MG PO TABS
5.0000 mg | ORAL_TABLET | Freq: Once | ORAL | Status: AC
  Administered 2018-06-08: 5 mg via ORAL

## 2018-06-08 MED ORDER — ONDANSETRON HCL 4 MG/2ML IJ SOLN
INTRAMUSCULAR | Status: DC | PRN
  Administered 2018-06-08: 4 mg via INTRAVENOUS

## 2018-06-08 MED ORDER — FENTANYL CITRATE (PF) 100 MCG/2ML IJ SOLN
INTRAMUSCULAR | Status: DC | PRN
  Administered 2018-06-08 (×3): 25 ug via INTRAVENOUS

## 2018-06-08 MED ORDER — PROPOFOL 10 MG/ML IV BOLUS
INTRAVENOUS | Status: AC
  Filled 2018-06-08: qty 20

## 2018-06-08 MED ORDER — ONDANSETRON HCL 4 MG/2ML IJ SOLN
INTRAMUSCULAR | Status: AC
  Filled 2018-06-08: qty 2

## 2018-06-08 MED ORDER — FENTANYL CITRATE (PF) 100 MCG/2ML IJ SOLN
INTRAMUSCULAR | Status: AC
  Filled 2018-06-08: qty 2

## 2018-06-08 MED ORDER — OXYCODONE HCL 5 MG PO TABS
ORAL_TABLET | ORAL | Status: AC
  Filled 2018-06-08: qty 1

## 2018-06-08 MED ORDER — SODIUM CHLORIDE 0.9 % IR SOLN
Status: DC | PRN
  Administered 2018-06-08 (×2): 3000 mL via INTRAVESICAL

## 2018-06-08 MED ORDER — CIPROFLOXACIN IN D5W 400 MG/200ML IV SOLN
400.0000 mg | Freq: Once | INTRAVENOUS | Status: AC
  Administered 2018-06-08: 400 mg via INTRAVENOUS

## 2018-06-08 MED ORDER — CIPROFLOXACIN HCL 500 MG PO TABS: 500.0000 mg | ORAL_TABLET | Freq: Two times a day (BID) | ORAL | 0 refills | Status: DC

## 2018-06-08 MED ORDER — LACTATED RINGERS IV SOLN
INTRAVENOUS | Status: DC
  Administered 2018-06-08: 12:00:00 via INTRAVENOUS

## 2018-06-08 MED ORDER — CIPROFLOXACIN IN D5W 400 MG/200ML IV SOLN
INTRAVENOUS | Status: AC
  Filled 2018-06-08: qty 200

## 2018-06-08 SURGICAL SUPPLY — 28 items
BAG URO CATCHER STRL LF (MISCELLANEOUS) ×3 IMPLANT
BASKET LASER NITINOL 1.9FR (BASKET) ×1 IMPLANT
BASKET ZERO TIP NITINOL 2.4FR (BASKET) IMPLANT
BSKT STON RTRVL 120 1.9FR (BASKET)
BSKT STON RTRVL ZERO TP 2.4FR (BASKET)
CATH INTERMIT  6FR 70CM (CATHETERS) ×2 IMPLANT
CLOTH BEACON ORANGE TIMEOUT ST (SAFETY) ×3 IMPLANT
COVER SURGICAL LIGHT HANDLE (MISCELLANEOUS) ×3 IMPLANT
COVER WAND RF STERILE (DRAPES) IMPLANT
EXTRACTOR STONE 1.7FRX115CM (UROLOGICAL SUPPLIES) ×2 IMPLANT
FIBER LASER FLEXIVA 365 (UROLOGICAL SUPPLIES) IMPLANT
FIBER LASER TRAC TIP (UROLOGICAL SUPPLIES) ×2 IMPLANT
GLOVE BIOGEL M 8.0 STRL (GLOVE) ×3 IMPLANT
GOWN STRL REUS W/ TWL XL LVL3 (GOWN DISPOSABLE) ×1 IMPLANT
GOWN STRL REUS W/TWL LRG LVL3 (GOWN DISPOSABLE) ×6 IMPLANT
GOWN STRL REUS W/TWL XL LVL3 (GOWN DISPOSABLE) ×3
GUIDEWIRE ANG ZIPWIRE 038X150 (WIRE) ×3 IMPLANT
GUIDEWIRE STR DUAL SENSOR (WIRE) ×5 IMPLANT
IV NS 1000ML (IV SOLUTION) ×3
IV NS 1000ML BAXH (IV SOLUTION) ×1 IMPLANT
KIT TURNOVER KIT A (KITS) IMPLANT
MANIFOLD NEPTUNE II (INSTRUMENTS) ×3 IMPLANT
PACK CYSTO (CUSTOM PROCEDURE TRAY) ×3 IMPLANT
SHEATH URETERAL 12FRX35CM (MISCELLANEOUS) ×2 IMPLANT
STENT URET 6FRX24 CONTOUR (STENTS) ×2 IMPLANT
TUBING CONNECTING 10 (TUBING) ×2 IMPLANT
TUBING CONNECTING 10' (TUBING) ×1
TUBING UROLOGY SET (TUBING) ×5 IMPLANT

## 2018-06-08 NOTE — Anesthesia Postprocedure Evaluation (Signed)
Anesthesia Post Note  Patient: Ana Newman  Procedure(s) Performed: CYSTOSCOPY WITH RETROGRADE PYELOGRAM, URETEROSCOPY AND STENT PLACEMENT (Bilateral ) HOLMIUM LASER APPLICATION (Bilateral )     Patient location during evaluation: PACU Anesthesia Type: General Level of consciousness: awake and alert Pain management: pain level controlled Vital Signs Assessment: post-procedure vital signs reviewed and stable Respiratory status: spontaneous breathing, nonlabored ventilation, respiratory function stable and patient connected to nasal cannula oxygen Cardiovascular status: blood pressure returned to baseline and stable Postop Assessment: no apparent nausea or vomiting Anesthetic complications: no    Last Vitals:  Vitals:   06/08/18 1430 06/08/18 1445  BP: (!) 131/52 133/65  Pulse: 74 73  Resp: 19 16  Temp:  (!) 36.4 C  SpO2: 100% 100%    Last Pain:  Vitals:   06/08/18 1445  TempSrc:   PainSc: 0-No pain                 [name]

## 2018-06-08 NOTE — Progress Notes (Signed)
Urine obtained via In& Out Cath.

## 2018-06-08 NOTE — H&P (Signed)
H&P  Chief Complaint: Bilateral kidney stones, status post stenting  History of Present Illness: Ana Newman is a 83 y.o. year old female presenting at this time for management of bilateral urolithiasis.  The patient underwent urgent cystoscopy and bilateral ureteral stenting on January 31 for an obstructing left distal ureteral stone, right renal pelvic stones as well as obvious infection.  She was treated in hospital for infection.  Follow-up a couple of weeks later revealed urinary tract infection which was treated.  The patient presents at this time for management of bilateral urolithiasis.  Within the past day or 2 we have attempted to get urinalysis from her at her skilled nursing facility.  The patient has refused to have this done.  She denies that she has had an infection, and has had no recent dysuria or gross hematuria.  Past Medical History:  Diagnosis Date  . Acute pyelonephritis   . Alzheimer's disease ([place])    alzheimer's dementia   . Bilateral foot-drop   . Bladder outlet obstruction   . Cholelithiasis   . Constipation   . Depression   . Diabetes mellitus without complication ([place])    type 2   . Diabetic ulcer of right buttock ([place])   . Diarrhea   . Dysrhythmia    ? hx of afib postop 1/31-03/06/18   . Environmental and seasonal allergies   . Essential hypertension, benign   . Foley catheter in place   . GERD (gastroesophageal reflux disease)   . History of kidney stones   . History of urinary tract infection   . Hyperlipidemia   . Hypertension   . Hypothyroidism   . Incontinence    wears diaper   . Insomnia   . Nephrolithiasis   . Osteoarthritis   . Pyelonephritis   . Reflux esophagitis   . Rickets, active   . Sepsis ([place])   . Sepsis ([place])    hx of   . Unspecified constipation   . Unspecified psychosis   . Unspecified vitamin D deficiency   . Vitamin D deficiency     Past Surgical History:  Procedure Laterality Date  . ABDOMINAL HYSTERECTOMY    .  CYSTOSCOPY W/ URETERAL STENT PLACEMENT Bilateral 05/18/2015   Procedure: CYSTOSCOPY WITH RETROGRADE PYELOGRAM/URETERAL STENT PLACEMENT;  Surgeon: Carolan Clines, MD;  Location: [facility];  Service: Urology;  Laterality: Bilateral;  . CYSTOSCOPY W/ URETERAL STENT PLACEMENT Bilateral 03/03/2018   Procedure: CYSTOSCOPY WITH RETROGRADE PYELOGRAM/URETERAL STENT PLACEMENT;  Surgeon: Franchot Gallo, MD;  Location: [place];  Service: Urology;  Laterality: Bilateral;  . CYSTOSCOPY W/ URETERAL STENT REMOVAL Bilateral 09/12/2015   Procedure: CYSTOSCOPY WITH STENT REMOVAL;  Surgeon: Carolan Clines, MD;  Location: [facility];  Service: Urology;  Laterality: Bilateral;  1 HOUR  . CYSTOSCOPY WITH RETROGRADE PYELOGRAM, URETEROSCOPY AND STENT PLACEMENT Bilateral 07/18/2015   Procedure: CYSTOSCOPY REMOVE LEFT DOUBLE J STENT LEFT RETROGRADE PYELOGRAM, LEFT URETEROSCOPY, PYELOSCOPY, REPLACEMENT OF LEFT DOUBLE J STENT;  Surgeon: Carolan Clines, MD;  Location: [facility];  Service: Urology;  Laterality: Bilateral;  . HOLMIUM LASER APPLICATION Bilateral 7/62/8315   Procedure: HOLMIUM LASER OF LEFT LOWER URETERAL STONE, LEFT PYELOSCOPY, REPLACE LEFT DOUBLE J STENT REMOVE RIGHT DOUBLE J STENT RIGHT RETROGRADE PYELOGRAM, REPLACEMENT RIGHT DOUBLE J STENT ;  Surgeon: Carolan Clines, MD;  Location: [facility];  Service: Urology;  Laterality: Bilateral;  . KNEE ARTHROSCOPY Left 1997    Home Medications:  Medications Prior to Admission  Medication Sig Dispense Refill  . acetaminophen (TYLENOL) 500  MG tablet Take 1,000 mg by mouth daily. Pain management    . aspirin 81 MG tablet Take 81 mg by mouth daily.     Marland Kitchen atorvastatin (LIPITOR) 10 MG tablet Take 10 mg by mouth daily at 6 PM.    . bisacodyl (DULCOLAX) 10 MG suppository Place 10 mg rectally daily as needed for moderate constipation.     . Cholecalciferol (VITAMIN D3) 50 MCG (2000 UT) TABS Take 2,000 Units by mouth daily.    . Cranberry 450 MG CAPS Take 450 mg by mouth  daily.    Marland Kitchen docusate sodium (COLACE) 100 MG capsule Take 100 mg by mouth 2 (two) times daily.    . Ensure (ENSURE) Take 120 mLs by mouth 2 (two) times daily between meals.    . insulin aspart (NOVOLOG) 100 UNIT/ML injection Inject 0-15 Units into the skin 3 (three) times daily with meals. (Patient taking differently: Inject 0-15 Units into the skin 3 (three) times daily with meals. Sliding Scale Insulin 151-200=2 units, 201-250=4 units, 251-300=6 units, 301-350=8 units, 351-400=10 units, Call MD for BS less than 70 and more than 400.)    . linaclotide (LINZESS) 72 MCG capsule Take 72 mcg by mouth daily.    Marland Kitchen lisinopril (PRINIVIL,ZESTRIL) 5 MG tablet Take 5 mg by mouth daily.     . metoprolol tartrate (LOPRESSOR) 25 MG tablet Take 25 mg by mouth 2 (two) times daily. For HTN  Hold if B/P < 100/60 or HR < 60    . Multiple Vitamin (MULTIVITAMIN WITH MINERALS) TABS tablet Take 1 tablet by mouth daily.    . Nutritional Supplements (PROMOD) LIQD Take 30 mLs by mouth 2 (two) times daily.    . ondansetron (ZOFRAN) 4 MG tablet Take 4 mg by mouth every 6 (six) hours as needed for nausea or vomiting.    Marland Kitchen oxyCODONE-acetaminophen (PERCOCET/ROXICET) 5-325 MG tablet Take 1 tablet by mouth every 6 (six) hours as needed for severe pain. 10 tablet 0  . polyethylene glycol (MIRALAX / GLYCOLAX) packet Take 17 g by mouth every 12 (twelve) hours as needed for mild constipation.    . Polyvinyl Alcohol-Povidone (FRESHKOTE) 2.7-2 % SOLN Apply 1 drop to eye 3 (three) times daily. To both eyes    . senna (SENOKOT) 8.6 MG tablet Take 2 tablets by mouth at bedtime.      Allergies:  Allergies  Allergen Reactions  . 5-Alpha Reductase Inhibitors     Family History  Family history unknown: Yes    Social History:  reports that she has never smoked. She has never used smokeless tobacco. She reports that she does not drink alcohol or use drugs.  ROS: A complete review of systems was performed.  All systems are negative  except for pertinent findings as noted.  Physical Exam:  Vital signs in last 24 hours: Temp:  [98.1 F (36.7 C)] 98.1 F (36.7 C) (05/07 1035) Pulse Rate:  [58] 58 (05/07 1035) Resp:  [16] 16 (05/07 1035) BP: (113)/(67) 113/67 (05/07 1035) SpO2:  [97 %] 97 % (05/07 1035) Weight:  [63.5 kg] 63.5 kg (05/07 1035) General:  Alert and oriented, No acute distress HEENT: Normocephalic, atraumatic Neck: No JVD or lymphadenopathy Cardiovascular: Regular rate and rhythm Lungs: Clear bilaterally Abdomen: Soft, nontender, nondistended, no abdominal masses Back: No CVA tenderness Extremities: No edema Neurologic: Grossly intact  Laboratory Data:  Results for orders placed or performed during the hospital encounter of 06/08/18 (from the past 24 hour(s))  Glucose, capillary  Status: None   Collection Time: 06/08/18 11:06 AM  Result Value Ref Range   Glucose-Capillary 96 70 - 99 mg/dL   Comment 1 Notify RN    Comment 2 Document in Chart   Urinalysis, Routine w reflex microscopic     Status: Abnormal   Collection Time: 06/08/18 11:29 AM  Result Value Ref Range   Color, Urine YELLOW YELLOW   APPearance TURBID (A) CLEAR   Specific Gravity, Urine 1.011 1.005 - 1.030   pH 5.0 5.0 - 8.0   Glucose, UA NEGATIVE NEGATIVE mg/dL   Hgb urine dipstick LARGE (A) NEGATIVE   Bilirubin Urine NEGATIVE NEGATIVE   Ketones, ur NEGATIVE NEGATIVE mg/dL   Protein, ur 30 (A) NEGATIVE mg/dL   Nitrite NEGATIVE NEGATIVE   Leukocytes,Ua LARGE (A) NEGATIVE   RBC / HPF >50 (H) 0 - 5 RBC/hpf   WBC, UA >50 (H) 0 - 5 WBC/hpf   Bacteria, UA FEW (A) NONE SEEN   Squamous Epithelial / LPF 6-10 0 - 5   WBC Clumps PRESENT    Mucus PRESENT   Basic metabolic panel     Status: Abnormal   Collection Time: 06/08/18 11:29 AM  Result Value Ref Range   Sodium 132 (L) 135 - 145 mmol/L   Potassium 6.1 (H) 3.5 - 5.1 mmol/L   Chloride 107 98 - 111 mmol/L   CO2 17 (L) 22 - 32 mmol/L   Glucose, Bld 106 (H) 70 - 99 mg/dL    BUN 64 (H) 8 - 23 mg/dL   Creatinine, Ser 1.14 (H) 0.44 - 1.00 mg/dL   Calcium 9.2 8.9 - 10.3 mg/dL   GFR calc non Af Amer 45 (L) >60 mL/min   GFR calc Af Amer 52 (L) >60 mL/min   Anion gap 8 5 - 15   No results found for this or any previous visit (from the past 240 hour(s)). Creatinine: Recent Labs    06/08/18 1129  CREATININE 1.14*    Radiologic Imaging: No results found.  Impression/Assessment:  Left distal ureteral stone, right renal pelvic stones, status post stenting  Plan:  Cystoscopy, double-J stent extractions bilaterally, bilateral ureteroscopy with stent replacement  Lillette Boxer Easton Fetty 06/08/2018, 12:20 PM  Lillette Boxer. Alise Calais MD

## 2018-06-08 NOTE — Discharge Instructions (Signed)

## 2018-06-08 NOTE — Progress Notes (Signed)
Dr Diona Fanti reports pt does not have to void prior to discharge.  Pt belongings bag had necklace and booties.  Pt put necklace with key around neck and  Booties on.  Report called to Tee at [facility] and [facility] aware pt ready for discharge.

## 2018-06-08 NOTE — Transfer of Care (Signed)
Immediate Anesthesia Transfer of Care Note  Patient: Ana Newman  Procedure(s) Performed: CYSTOSCOPY WITH RETROGRADE PYELOGRAM, URETEROSCOPY AND STENT PLACEMENT (Bilateral ) HOLMIUM LASER APPLICATION (Bilateral )  Patient Location: PACU  Anesthesia Type:General  Level of Consciousness: awake, alert  and patient cooperative  Airway & Oxygen Therapy: Patient Spontanous Breathing and Patient connected to face mask oxygen  Post-op Assessment: Report given to RN and Post -op Vital signs reviewed and stable  Post vital signs: Reviewed and stable  Last Vitals:  Vitals Value Taken Time  BP 145/71 06/08/2018  2:11 PM  Temp    Pulse 80 06/08/2018  2:12 PM  Resp 17 06/08/2018  2:12 PM  SpO2 100 % 06/08/2018  2:12 PM  Vitals shown include unvalidated device data.  Last Pain:  Vitals:   06/08/18 1035  TempSrc: Oral         Complications: No apparent anesthesia complications

## 2018-06-08 NOTE — Interval H&P Note (Signed)
History and Physical Interval Note:  06/08/2018 12:35 PM  Ana Newman  has presented today for surgery, with the diagnosis of LEFT URETERAL AND RIGHT RENAL PELVIS CALCULUS.  The various methods of treatment have been discussed with the patient and family. After consideration of risks, benefits and other options for treatment, the patient has consented to  Procedure(s) with comments: CYSTOSCOPY WITH RETROGRADE PYELOGRAM, URETEROSCOPY AND STENT PLACEMENT (Bilateral) - 30 MINS HOLMIUM LASER APPLICATION (Bilateral) as a surgical intervention.  The patient's history has been reviewed, patient examined, no change in status, stable for surgery.  I have reviewed the patient's chart and labs.  Questions were answered to the patient's satisfaction.     Lillette Boxer Winston Misner

## 2018-06-08 NOTE — Op Note (Signed)
Preoperative diagnosis: Left distal ureteral stone, right renal pelvic calculi  Postoperative diagnosis: Same  Principal procedure: Cystoscopy, bilateral double-J stent extraction, bilateral ureteroscopy, extraction of left distal ureteral calculus, holmium laser lithotripsy and extraction of right renal pelvic calculi, placement of 6 French by 24 cm contour double-J stent in right ureter  Surgeon: Taytum Wheller  Anesthesia: General  Complications: None  Specimen: Stones  Drains: None  Estimated blood loss: None  Indications: 83 year old female who underwent urgent stenting of the bilateral ureters in late January, 2020 for an obstructing left distal ureteral stone with infection.  She had right renal pelvic calculi at that time that I felt would necessitate eventual treatment.  She was treated with antibiotics, and presents at this time, albeit delayed, for management of these calculi.  Recent urinalysis was performed revealing rare bacteria and white cells with multiple red cells.  It is felt, with the patient having had her procedure canceled several times already, that we will proceed with the above.  She is aware of the procedure.  Also, her daughter is aware of the procedure.  Description of procedure: The patient was properly identified in the holding area.  She was taken to the operating room where IV antibiotics were administered and general anesthetic was administered as well.  She was placed in the dorsolithotomy position with care taken to not over manipulate the left hip/knee.  Genitalia and perineum were prepped and draped.  Proper timeout was performed.  A 21 French panendoscope was advanced into the bladder which was inspected and found to be normal except for the 2 ureteral stents present.  The left double-J stent was grasped and brought out just through the meatus.  The stent was cannulated with a sensor tip guidewire which was easily passed into the renal pelvis on the left.  The  guidewire was left indwelling and the stent removed.  The ureteral orifice was dilated first with the obturator and then the entire 12/14 ureteral access catheter.  The ureteroscope was then passed.  The stone was identified approximately 3 cm proximal to the ureterovesical junction.  It was grasped with the engage basket and brought out, although with some difficulty through the ureteral orifice.  Reinspection revealed no other calculi within the ureter.  There was no significant edema at the ureteral orifice and stent was not left in.  The guidewire was removed.  Similar procedure was done with the right double-J stent which was cannulated with a sensor tip guidewire  after partially extracted from the bladder.  The guidewire was easily advanced up into the renal pelvis.  The ureteral access catheter was then easily passed up to the ureterovesical junction on the right.  Additionally, a safety wire was placed.  I then placed the flexible dual-lumen ureteroscope into the renal pelvis.  The 2 stones were identified and were felt to be too large to be extracted through the next access catheter.  They were then fragmented using the 200 m fiber with holmium laser energy set at 30 Hz and 0.8 J.  Fragments were then easily extracted.  Several small sand-like fragments were left as they could not be adequately removed with the engage basket.  Following this, with the guidewire left in place, the ureteroscope and access catheter were removed.  The guidewire was backloaded through the 21 French cystoscope and a 24 cm x 6 [place] contour double-J stent was easily placed with excellent proximal and distal curl seen once the guidewire was removed.  At this point,  the procedure was terminated.  The bladder was drained.  The patient was then awakened and taken to the PACU in stable condition.  She tolerated the procedure well.

## 2018-06-08 NOTE — Anesthesia Procedure Notes (Signed)
Procedure Name: LMA Insertion Date/Time: 06/08/2018 12:47 PM Performed by: Glory Buff, CRNA Pre-anesthesia Checklist: Patient identified, Emergency Drugs available, Suction available and Patient being monitored Patient Re-evaluated:Patient Re-evaluated prior to induction Oxygen Delivery Method: Circle system utilized Preoxygenation: Pre-oxygenation with 100% oxygen Induction Type: IV induction LMA: LMA inserted LMA Size: 4.0 Number of attempts: 1 Placement Confirmation: positive ETCO2 Tube secured with: Tape Dental Injury: Teeth and Oropharynx as per pre-operative assessment

## 2018-06-08 NOTE — Progress Notes (Signed)
[place] notifed pt to start taking Cipro after discharge from hospital.

## 2018-06-09 ENCOUNTER — Encounter (HOSPITAL_COMMUNITY): Payer: Self-pay | Admitting: Urology

## 2018-06-09 LAB — URINE CULTURE

## 2018-06-10 DIAGNOSIS — F329 Major depressive disorder, single episode, unspecified: Secondary | ICD-10-CM | POA: Diagnosis not present

## 2018-06-17 DIAGNOSIS — F329 Major depressive disorder, single episode, unspecified: Secondary | ICD-10-CM | POA: Diagnosis not present

## 2018-06-20 DIAGNOSIS — N218 Other lower urinary tract calculus: Secondary | ICD-10-CM | POA: Diagnosis not present

## 2018-06-20 DIAGNOSIS — R8271 Bacteriuria: Secondary | ICD-10-CM | POA: Diagnosis not present

## 2018-06-20 DIAGNOSIS — K8021 Calculus of gallbladder without cholecystitis with obstruction: Secondary | ICD-10-CM | POA: Diagnosis not present

## 2018-06-20 DIAGNOSIS — N1330 Unspecified hydronephrosis: Secondary | ICD-10-CM | POA: Diagnosis not present

## 2018-06-20 DIAGNOSIS — N200 Calculus of kidney: Secondary | ICD-10-CM | POA: Diagnosis not present

## 2018-06-20 DIAGNOSIS — N390 Urinary tract infection, site not specified: Secondary | ICD-10-CM | POA: Diagnosis not present

## 2018-06-20 DIAGNOSIS — N12 Tubulo-interstitial nephritis, not specified as acute or chronic: Secondary | ICD-10-CM | POA: Diagnosis not present

## 2018-06-23 DIAGNOSIS — F329 Major depressive disorder, single episode, unspecified: Secondary | ICD-10-CM | POA: Diagnosis not present

## 2018-06-29 DIAGNOSIS — N12 Tubulo-interstitial nephritis, not specified as acute or chronic: Secondary | ICD-10-CM | POA: Diagnosis not present

## 2018-06-29 DIAGNOSIS — K8021 Calculus of gallbladder without cholecystitis with obstruction: Secondary | ICD-10-CM | POA: Diagnosis not present

## 2018-06-29 DIAGNOSIS — E1159 Type 2 diabetes mellitus with other circulatory complications: Secondary | ICD-10-CM | POA: Diagnosis not present

## 2018-06-29 DIAGNOSIS — N1330 Unspecified hydronephrosis: Secondary | ICD-10-CM | POA: Diagnosis not present

## 2018-07-01 DIAGNOSIS — F329 Major depressive disorder, single episode, unspecified: Secondary | ICD-10-CM | POA: Diagnosis not present

## 2018-07-04 DIAGNOSIS — K219 Gastro-esophageal reflux disease without esophagitis: Secondary | ICD-10-CM | POA: Diagnosis not present

## 2018-07-04 DIAGNOSIS — K5904 Chronic idiopathic constipation: Secondary | ICD-10-CM | POA: Diagnosis not present

## 2018-07-04 DIAGNOSIS — N12 Tubulo-interstitial nephritis, not specified as acute or chronic: Secondary | ICD-10-CM | POA: Diagnosis not present

## 2018-07-04 DIAGNOSIS — R5381 Other malaise: Secondary | ICD-10-CM | POA: Diagnosis not present

## 2018-07-06 DIAGNOSIS — F329 Major depressive disorder, single episode, unspecified: Secondary | ICD-10-CM | POA: Diagnosis not present

## 2018-07-08 DIAGNOSIS — F329 Major depressive disorder, single episode, unspecified: Secondary | ICD-10-CM | POA: Diagnosis not present

## 2018-07-15 DIAGNOSIS — F329 Major depressive disorder, single episode, unspecified: Secondary | ICD-10-CM | POA: Diagnosis not present

## 2018-07-27 DIAGNOSIS — R5381 Other malaise: Secondary | ICD-10-CM | POA: Diagnosis not present

## 2018-07-27 DIAGNOSIS — E785 Hyperlipidemia, unspecified: Secondary | ICD-10-CM | POA: Diagnosis not present

## 2018-07-27 DIAGNOSIS — K5904 Chronic idiopathic constipation: Secondary | ICD-10-CM | POA: Diagnosis not present

## 2018-07-27 DIAGNOSIS — E1159 Type 2 diabetes mellitus with other circulatory complications: Secondary | ICD-10-CM | POA: Diagnosis not present

## 2018-07-28 DIAGNOSIS — F329 Major depressive disorder, single episode, unspecified: Secondary | ICD-10-CM | POA: Diagnosis not present

## 2018-07-31 DIAGNOSIS — E1159 Type 2 diabetes mellitus with other circulatory complications: Secondary | ICD-10-CM | POA: Diagnosis not present

## 2018-07-31 DIAGNOSIS — I10 Essential (primary) hypertension: Secondary | ICD-10-CM | POA: Diagnosis not present

## 2018-07-31 DIAGNOSIS — G309 Alzheimer's disease, unspecified: Secondary | ICD-10-CM | POA: Diagnosis not present

## 2018-08-04 DIAGNOSIS — F329 Major depressive disorder, single episode, unspecified: Secondary | ICD-10-CM | POA: Diagnosis not present

## 2018-08-09 DIAGNOSIS — K219 Gastro-esophageal reflux disease without esophagitis: Secondary | ICD-10-CM | POA: Diagnosis not present

## 2018-08-09 DIAGNOSIS — I10 Essential (primary) hypertension: Secondary | ICD-10-CM | POA: Diagnosis not present

## 2018-08-09 DIAGNOSIS — G309 Alzheimer's disease, unspecified: Secondary | ICD-10-CM | POA: Diagnosis not present

## 2018-08-09 DIAGNOSIS — E1159 Type 2 diabetes mellitus with other circulatory complications: Secondary | ICD-10-CM | POA: Diagnosis not present

## 2018-08-10 DIAGNOSIS — E559 Vitamin D deficiency, unspecified: Secondary | ICD-10-CM | POA: Diagnosis not present

## 2018-08-10 DIAGNOSIS — E039 Hypothyroidism, unspecified: Secondary | ICD-10-CM | POA: Diagnosis not present

## 2018-08-10 DIAGNOSIS — E785 Hyperlipidemia, unspecified: Secondary | ICD-10-CM | POA: Diagnosis not present

## 2018-08-10 DIAGNOSIS — E119 Type 2 diabetes mellitus without complications: Secondary | ICD-10-CM | POA: Diagnosis not present

## 2018-08-10 DIAGNOSIS — R7989 Other specified abnormal findings of blood chemistry: Secondary | ICD-10-CM | POA: Diagnosis not present

## 2018-08-10 DIAGNOSIS — D649 Anemia, unspecified: Secondary | ICD-10-CM | POA: Diagnosis not present

## 2018-08-11 DIAGNOSIS — E875 Hyperkalemia: Secondary | ICD-10-CM | POA: Diagnosis not present

## 2018-08-11 DIAGNOSIS — E782 Mixed hyperlipidemia: Secondary | ICD-10-CM | POA: Diagnosis not present

## 2018-08-12 DIAGNOSIS — F329 Major depressive disorder, single episode, unspecified: Secondary | ICD-10-CM | POA: Diagnosis not present

## 2018-08-14 DIAGNOSIS — D649 Anemia, unspecified: Secondary | ICD-10-CM | POA: Diagnosis not present

## 2018-08-14 DIAGNOSIS — N200 Calculus of kidney: Secondary | ICD-10-CM | POA: Diagnosis not present

## 2018-08-14 DIAGNOSIS — I10 Essential (primary) hypertension: Secondary | ICD-10-CM | POA: Diagnosis not present

## 2018-08-15 DIAGNOSIS — E1159 Type 2 diabetes mellitus with other circulatory complications: Secondary | ICD-10-CM | POA: Diagnosis not present

## 2018-08-15 DIAGNOSIS — I10 Essential (primary) hypertension: Secondary | ICD-10-CM | POA: Diagnosis not present

## 2018-08-15 DIAGNOSIS — E782 Mixed hyperlipidemia: Secondary | ICD-10-CM | POA: Diagnosis not present

## 2018-08-15 DIAGNOSIS — E875 Hyperkalemia: Secondary | ICD-10-CM | POA: Diagnosis not present

## 2018-08-18 DIAGNOSIS — F329 Major depressive disorder, single episode, unspecified: Secondary | ICD-10-CM | POA: Diagnosis not present

## 2018-08-24 DIAGNOSIS — I10 Essential (primary) hypertension: Secondary | ICD-10-CM | POA: Diagnosis not present

## 2018-08-24 DIAGNOSIS — E782 Mixed hyperlipidemia: Secondary | ICD-10-CM | POA: Diagnosis not present

## 2018-08-24 DIAGNOSIS — K219 Gastro-esophageal reflux disease without esophagitis: Secondary | ICD-10-CM | POA: Diagnosis not present

## 2018-08-24 DIAGNOSIS — E1159 Type 2 diabetes mellitus with other circulatory complications: Secondary | ICD-10-CM | POA: Diagnosis not present

## 2018-09-01 ENCOUNTER — Other Ambulatory Visit: Payer: Self-pay

## 2018-09-01 DIAGNOSIS — E1159 Type 2 diabetes mellitus with other circulatory complications: Secondary | ICD-10-CM | POA: Diagnosis not present

## 2018-09-01 DIAGNOSIS — I10 Essential (primary) hypertension: Secondary | ICD-10-CM | POA: Diagnosis not present

## 2018-09-01 DIAGNOSIS — G309 Alzheimer's disease, unspecified: Secondary | ICD-10-CM | POA: Diagnosis not present

## 2018-09-06 DIAGNOSIS — K219 Gastro-esophageal reflux disease without esophagitis: Secondary | ICD-10-CM | POA: Diagnosis not present

## 2018-09-06 DIAGNOSIS — K5904 Chronic idiopathic constipation: Secondary | ICD-10-CM | POA: Diagnosis not present

## 2018-09-06 DIAGNOSIS — E1159 Type 2 diabetes mellitus with other circulatory complications: Secondary | ICD-10-CM | POA: Diagnosis not present

## 2018-09-06 DIAGNOSIS — I10 Essential (primary) hypertension: Secondary | ICD-10-CM | POA: Diagnosis not present

## 2018-09-07 DIAGNOSIS — F329 Major depressive disorder, single episode, unspecified: Secondary | ICD-10-CM | POA: Diagnosis not present

## 2018-09-13 DIAGNOSIS — F329 Major depressive disorder, single episode, unspecified: Secondary | ICD-10-CM | POA: Diagnosis not present

## 2018-09-21 DIAGNOSIS — E1159 Type 2 diabetes mellitus with other circulatory complications: Secondary | ICD-10-CM | POA: Diagnosis not present

## 2018-09-21 DIAGNOSIS — K219 Gastro-esophageal reflux disease without esophagitis: Secondary | ICD-10-CM | POA: Diagnosis not present

## 2018-09-21 DIAGNOSIS — I10 Essential (primary) hypertension: Secondary | ICD-10-CM | POA: Diagnosis not present

## 2018-09-21 DIAGNOSIS — F0280 Dementia in other diseases classified elsewhere without behavioral disturbance: Secondary | ICD-10-CM | POA: Diagnosis not present

## 2018-09-22 DIAGNOSIS — G309 Alzheimer's disease, unspecified: Secondary | ICD-10-CM | POA: Diagnosis not present

## 2018-09-22 DIAGNOSIS — E1159 Type 2 diabetes mellitus with other circulatory complications: Secondary | ICD-10-CM | POA: Diagnosis not present

## 2018-09-22 DIAGNOSIS — I10 Essential (primary) hypertension: Secondary | ICD-10-CM | POA: Diagnosis not present

## 2018-09-23 DIAGNOSIS — F329 Major depressive disorder, single episode, unspecified: Secondary | ICD-10-CM | POA: Diagnosis not present

## 2018-09-29 DIAGNOSIS — F329 Major depressive disorder, single episode, unspecified: Secondary | ICD-10-CM | POA: Diagnosis not present

## 2018-10-03 DIAGNOSIS — F329 Major depressive disorder, single episode, unspecified: Secondary | ICD-10-CM | POA: Diagnosis not present

## 2018-10-05 DIAGNOSIS — M1991 Primary osteoarthritis, unspecified site: Secondary | ICD-10-CM | POA: Diagnosis not present

## 2018-10-05 DIAGNOSIS — G309 Alzheimer's disease, unspecified: Secondary | ICD-10-CM | POA: Diagnosis not present

## 2018-10-05 DIAGNOSIS — F329 Major depressive disorder, single episode, unspecified: Secondary | ICD-10-CM | POA: Diagnosis not present

## 2018-10-05 DIAGNOSIS — E1159 Type 2 diabetes mellitus with other circulatory complications: Secondary | ICD-10-CM | POA: Diagnosis not present

## 2018-10-05 DIAGNOSIS — M2450 Contracture, unspecified joint: Secondary | ICD-10-CM | POA: Diagnosis not present

## 2018-10-06 DIAGNOSIS — G309 Alzheimer's disease, unspecified: Secondary | ICD-10-CM | POA: Diagnosis not present

## 2018-10-06 DIAGNOSIS — E1159 Type 2 diabetes mellitus with other circulatory complications: Secondary | ICD-10-CM | POA: Diagnosis not present

## 2018-10-06 DIAGNOSIS — M2450 Contracture, unspecified joint: Secondary | ICD-10-CM | POA: Diagnosis not present

## 2018-10-06 DIAGNOSIS — K219 Gastro-esophageal reflux disease without esophagitis: Secondary | ICD-10-CM | POA: Diagnosis not present

## 2018-10-06 DIAGNOSIS — K5904 Chronic idiopathic constipation: Secondary | ICD-10-CM | POA: Diagnosis not present

## 2018-10-06 DIAGNOSIS — F333 Major depressive disorder, recurrent, severe with psychotic symptoms: Secondary | ICD-10-CM | POA: Diagnosis not present

## 2018-10-06 DIAGNOSIS — M1991 Primary osteoarthritis, unspecified site: Secondary | ICD-10-CM | POA: Diagnosis not present

## 2018-10-08 DIAGNOSIS — M1991 Primary osteoarthritis, unspecified site: Secondary | ICD-10-CM | POA: Diagnosis not present

## 2018-10-08 DIAGNOSIS — E1159 Type 2 diabetes mellitus with other circulatory complications: Secondary | ICD-10-CM | POA: Diagnosis not present

## 2018-10-08 DIAGNOSIS — G309 Alzheimer's disease, unspecified: Secondary | ICD-10-CM | POA: Diagnosis not present

## 2018-10-08 DIAGNOSIS — M2450 Contracture, unspecified joint: Secondary | ICD-10-CM | POA: Diagnosis not present

## 2018-10-10 DIAGNOSIS — F329 Major depressive disorder, single episode, unspecified: Secondary | ICD-10-CM | POA: Diagnosis not present

## 2018-10-12 DIAGNOSIS — G309 Alzheimer's disease, unspecified: Secondary | ICD-10-CM | POA: Diagnosis not present

## 2018-10-12 DIAGNOSIS — E1159 Type 2 diabetes mellitus with other circulatory complications: Secondary | ICD-10-CM | POA: Diagnosis not present

## 2018-10-12 DIAGNOSIS — M1991 Primary osteoarthritis, unspecified site: Secondary | ICD-10-CM | POA: Diagnosis not present

## 2018-10-12 DIAGNOSIS — M2450 Contracture, unspecified joint: Secondary | ICD-10-CM | POA: Diagnosis not present

## 2018-10-18 DIAGNOSIS — M2450 Contracture, unspecified joint: Secondary | ICD-10-CM | POA: Diagnosis not present

## 2018-10-18 DIAGNOSIS — G309 Alzheimer's disease, unspecified: Secondary | ICD-10-CM | POA: Diagnosis not present

## 2018-10-18 DIAGNOSIS — E1159 Type 2 diabetes mellitus with other circulatory complications: Secondary | ICD-10-CM | POA: Diagnosis not present

## 2018-10-18 DIAGNOSIS — M1991 Primary osteoarthritis, unspecified site: Secondary | ICD-10-CM | POA: Diagnosis not present

## 2018-10-19 DIAGNOSIS — E1159 Type 2 diabetes mellitus with other circulatory complications: Secondary | ICD-10-CM | POA: Diagnosis not present

## 2018-10-19 DIAGNOSIS — F329 Major depressive disorder, single episode, unspecified: Secondary | ICD-10-CM | POA: Diagnosis not present

## 2018-10-19 DIAGNOSIS — I10 Essential (primary) hypertension: Secondary | ICD-10-CM | POA: Diagnosis not present

## 2018-10-19 DIAGNOSIS — K219 Gastro-esophageal reflux disease without esophagitis: Secondary | ICD-10-CM | POA: Diagnosis not present

## 2018-10-19 DIAGNOSIS — E785 Hyperlipidemia, unspecified: Secondary | ICD-10-CM | POA: Diagnosis not present

## 2018-10-23 DIAGNOSIS — F329 Major depressive disorder, single episode, unspecified: Secondary | ICD-10-CM | POA: Diagnosis not present

## 2018-10-23 DIAGNOSIS — Z20828 Contact with and (suspected) exposure to other viral communicable diseases: Secondary | ICD-10-CM | POA: Diagnosis not present

## 2018-10-27 DIAGNOSIS — I10 Essential (primary) hypertension: Secondary | ICD-10-CM | POA: Diagnosis not present

## 2018-10-27 DIAGNOSIS — G309 Alzheimer's disease, unspecified: Secondary | ICD-10-CM | POA: Diagnosis not present

## 2018-10-27 DIAGNOSIS — E1159 Type 2 diabetes mellitus with other circulatory complications: Secondary | ICD-10-CM | POA: Diagnosis not present

## 2018-11-03 DIAGNOSIS — I10 Essential (primary) hypertension: Secondary | ICD-10-CM | POA: Diagnosis not present

## 2018-11-03 DIAGNOSIS — G309 Alzheimer's disease, unspecified: Secondary | ICD-10-CM | POA: Diagnosis not present

## 2018-11-03 DIAGNOSIS — E1159 Type 2 diabetes mellitus with other circulatory complications: Secondary | ICD-10-CM | POA: Diagnosis not present

## 2018-11-03 DIAGNOSIS — K219 Gastro-esophageal reflux disease without esophagitis: Secondary | ICD-10-CM | POA: Diagnosis not present

## 2018-11-04 DIAGNOSIS — Z23 Encounter for immunization: Secondary | ICD-10-CM | POA: Diagnosis not present

## 2018-11-09 DIAGNOSIS — E119 Type 2 diabetes mellitus without complications: Secondary | ICD-10-CM | POA: Diagnosis not present

## 2018-11-09 DIAGNOSIS — H26493 Other secondary cataract, bilateral: Secondary | ICD-10-CM | POA: Diagnosis not present

## 2018-11-09 DIAGNOSIS — Z794 Long term (current) use of insulin: Secondary | ICD-10-CM | POA: Diagnosis not present

## 2018-11-09 DIAGNOSIS — H04123 Dry eye syndrome of bilateral lacrimal glands: Secondary | ICD-10-CM | POA: Diagnosis not present

## 2018-11-13 DIAGNOSIS — F333 Major depressive disorder, recurrent, severe with psychotic symptoms: Secondary | ICD-10-CM | POA: Diagnosis not present

## 2018-11-16 DIAGNOSIS — I10 Essential (primary) hypertension: Secondary | ICD-10-CM | POA: Diagnosis not present

## 2018-11-16 DIAGNOSIS — K5904 Chronic idiopathic constipation: Secondary | ICD-10-CM | POA: Diagnosis not present

## 2018-11-16 DIAGNOSIS — K219 Gastro-esophageal reflux disease without esophagitis: Secondary | ICD-10-CM | POA: Diagnosis not present

## 2018-11-16 DIAGNOSIS — E1159 Type 2 diabetes mellitus with other circulatory complications: Secondary | ICD-10-CM | POA: Diagnosis not present

## 2018-11-22 DIAGNOSIS — F333 Major depressive disorder, recurrent, severe with psychotic symptoms: Secondary | ICD-10-CM | POA: Diagnosis not present

## 2018-11-24 DIAGNOSIS — I10 Essential (primary) hypertension: Secondary | ICD-10-CM | POA: Diagnosis not present

## 2018-11-24 DIAGNOSIS — E1159 Type 2 diabetes mellitus with other circulatory complications: Secondary | ICD-10-CM | POA: Diagnosis not present

## 2018-11-24 DIAGNOSIS — G309 Alzheimer's disease, unspecified: Secondary | ICD-10-CM | POA: Diagnosis not present

## 2018-11-27 DIAGNOSIS — Z20828 Contact with and (suspected) exposure to other viral communicable diseases: Secondary | ICD-10-CM | POA: Diagnosis not present

## 2018-11-29 DIAGNOSIS — F333 Major depressive disorder, recurrent, severe with psychotic symptoms: Secondary | ICD-10-CM | POA: Diagnosis not present

## 2018-12-01 DIAGNOSIS — I10 Essential (primary) hypertension: Secondary | ICD-10-CM | POA: Diagnosis not present

## 2018-12-01 DIAGNOSIS — K5904 Chronic idiopathic constipation: Secondary | ICD-10-CM | POA: Diagnosis not present

## 2018-12-01 DIAGNOSIS — E1159 Type 2 diabetes mellitus with other circulatory complications: Secondary | ICD-10-CM | POA: Diagnosis not present

## 2018-12-01 DIAGNOSIS — K219 Gastro-esophageal reflux disease without esophagitis: Secondary | ICD-10-CM | POA: Diagnosis not present

## 2018-12-04 DIAGNOSIS — Z20828 Contact with and (suspected) exposure to other viral communicable diseases: Secondary | ICD-10-CM | POA: Diagnosis not present

## 2018-12-06 DIAGNOSIS — I10 Essential (primary) hypertension: Secondary | ICD-10-CM | POA: Diagnosis present

## 2018-12-06 DIAGNOSIS — E1159 Type 2 diabetes mellitus with other circulatory complications: Secondary | ICD-10-CM | POA: Diagnosis present

## 2018-12-06 DIAGNOSIS — Z960 Presence of urogenital implants: Secondary | ICD-10-CM | POA: Diagnosis not present

## 2018-12-06 DIAGNOSIS — N319 Neuromuscular dysfunction of bladder, unspecified: Secondary | ICD-10-CM | POA: Diagnosis present

## 2018-12-06 DIAGNOSIS — U071 COVID-19: Secondary | ICD-10-CM | POA: Diagnosis present

## 2018-12-06 DIAGNOSIS — F333 Major depressive disorder, recurrent, severe with psychotic symptoms: Secondary | ICD-10-CM | POA: Diagnosis not present

## 2018-12-06 DIAGNOSIS — G309 Alzheimer's disease, unspecified: Secondary | ICD-10-CM | POA: Diagnosis present

## 2018-12-09 DIAGNOSIS — F333 Major depressive disorder, recurrent, severe with psychotic symptoms: Secondary | ICD-10-CM | POA: Diagnosis not present

## 2018-12-14 DIAGNOSIS — F333 Major depressive disorder, recurrent, severe with psychotic symptoms: Secondary | ICD-10-CM | POA: Diagnosis not present

## 2018-12-19 DIAGNOSIS — F333 Major depressive disorder, recurrent, severe with psychotic symptoms: Secondary | ICD-10-CM | POA: Diagnosis not present

## 2018-12-24 DIAGNOSIS — F333 Major depressive disorder, recurrent, severe with psychotic symptoms: Secondary | ICD-10-CM | POA: Diagnosis not present

## 2018-12-26 DIAGNOSIS — I10 Essential (primary) hypertension: Secondary | ICD-10-CM | POA: Diagnosis not present

## 2018-12-26 DIAGNOSIS — G309 Alzheimer's disease, unspecified: Secondary | ICD-10-CM | POA: Diagnosis not present

## 2018-12-26 DIAGNOSIS — E1159 Type 2 diabetes mellitus with other circulatory complications: Secondary | ICD-10-CM | POA: Diagnosis not present

## 2018-12-27 DIAGNOSIS — R5381 Other malaise: Secondary | ICD-10-CM | POA: Diagnosis not present

## 2018-12-27 DIAGNOSIS — E559 Vitamin D deficiency, unspecified: Secondary | ICD-10-CM | POA: Diagnosis not present

## 2018-12-27 DIAGNOSIS — U071 COVID-19: Secondary | ICD-10-CM | POA: Diagnosis not present

## 2018-12-27 DIAGNOSIS — E785 Hyperlipidemia, unspecified: Secondary | ICD-10-CM | POA: Diagnosis not present

## 2019-01-04 DIAGNOSIS — E1140 Type 2 diabetes mellitus with diabetic neuropathy, unspecified: Secondary | ICD-10-CM | POA: Diagnosis not present

## 2019-01-09 DIAGNOSIS — S0081XA Abrasion of other part of head, initial encounter: Secondary | ICD-10-CM | POA: Diagnosis not present

## 2019-01-15 DIAGNOSIS — F333 Major depressive disorder, recurrent, severe with psychotic symptoms: Secondary | ICD-10-CM | POA: Diagnosis not present

## 2019-02-05 DIAGNOSIS — F333 Major depressive disorder, recurrent, severe with psychotic symptoms: Secondary | ICD-10-CM | POA: Diagnosis not present

## 2019-02-07 DIAGNOSIS — L89316 Pressure-induced deep tissue damage of right buttock: Secondary | ICD-10-CM | POA: Diagnosis not present

## 2019-02-07 DIAGNOSIS — E1122 Type 2 diabetes mellitus with diabetic chronic kidney disease: Secondary | ICD-10-CM | POA: Diagnosis not present

## 2019-02-07 DIAGNOSIS — L2089 Other atopic dermatitis: Secondary | ICD-10-CM | POA: Diagnosis not present

## 2019-02-07 DIAGNOSIS — N39498 Other specified urinary incontinence: Secondary | ICD-10-CM | POA: Diagnosis not present

## 2019-02-08 DIAGNOSIS — Z23 Encounter for immunization: Secondary | ICD-10-CM | POA: Diagnosis not present

## 2019-02-12 DIAGNOSIS — F333 Major depressive disorder, recurrent, severe with psychotic symptoms: Secondary | ICD-10-CM | POA: Diagnosis not present

## 2019-02-15 DIAGNOSIS — G309 Alzheimer's disease, unspecified: Secondary | ICD-10-CM | POA: Diagnosis not present

## 2019-02-15 DIAGNOSIS — I10 Essential (primary) hypertension: Secondary | ICD-10-CM | POA: Diagnosis not present

## 2019-02-15 DIAGNOSIS — E1159 Type 2 diabetes mellitus with other circulatory complications: Secondary | ICD-10-CM | POA: Diagnosis not present

## 2019-02-20 DIAGNOSIS — F333 Major depressive disorder, recurrent, severe with psychotic symptoms: Secondary | ICD-10-CM | POA: Diagnosis not present

## 2019-02-22 DIAGNOSIS — I10 Essential (primary) hypertension: Secondary | ICD-10-CM | POA: Diagnosis not present

## 2019-02-22 DIAGNOSIS — R5381 Other malaise: Secondary | ICD-10-CM | POA: Diagnosis not present

## 2019-02-22 DIAGNOSIS — G309 Alzheimer's disease, unspecified: Secondary | ICD-10-CM | POA: Diagnosis not present

## 2019-02-26 DIAGNOSIS — F333 Major depressive disorder, recurrent, severe with psychotic symptoms: Secondary | ICD-10-CM | POA: Diagnosis not present

## 2019-03-06 DIAGNOSIS — F333 Major depressive disorder, recurrent, severe with psychotic symptoms: Secondary | ICD-10-CM | POA: Diagnosis not present

## 2019-03-08 DIAGNOSIS — Z23 Encounter for immunization: Secondary | ICD-10-CM | POA: Diagnosis not present

## 2019-03-11 DIAGNOSIS — I10 Essential (primary) hypertension: Secondary | ICD-10-CM | POA: Diagnosis not present

## 2019-03-11 DIAGNOSIS — G309 Alzheimer's disease, unspecified: Secondary | ICD-10-CM | POA: Diagnosis not present

## 2019-03-11 DIAGNOSIS — E1159 Type 2 diabetes mellitus with other circulatory complications: Secondary | ICD-10-CM | POA: Diagnosis not present

## 2019-03-12 DIAGNOSIS — G309 Alzheimer's disease, unspecified: Secondary | ICD-10-CM | POA: Diagnosis not present

## 2019-03-12 DIAGNOSIS — K219 Gastro-esophageal reflux disease without esophagitis: Secondary | ICD-10-CM | POA: Diagnosis not present

## 2019-03-12 DIAGNOSIS — E1159 Type 2 diabetes mellitus with other circulatory complications: Secondary | ICD-10-CM | POA: Diagnosis not present

## 2019-03-12 DIAGNOSIS — I10 Essential (primary) hypertension: Secondary | ICD-10-CM | POA: Diagnosis not present

## 2019-03-13 DIAGNOSIS — Z20828 Contact with and (suspected) exposure to other viral communicable diseases: Secondary | ICD-10-CM | POA: Diagnosis not present

## 2019-03-14 DIAGNOSIS — D6489 Other specified anemias: Secondary | ICD-10-CM | POA: Diagnosis not present

## 2019-03-14 DIAGNOSIS — D649 Anemia, unspecified: Secondary | ICD-10-CM | POA: Diagnosis not present

## 2019-03-14 DIAGNOSIS — E119 Type 2 diabetes mellitus without complications: Secondary | ICD-10-CM | POA: Diagnosis not present

## 2019-03-14 DIAGNOSIS — E559 Vitamin D deficiency, unspecified: Secondary | ICD-10-CM | POA: Diagnosis not present

## 2019-03-14 DIAGNOSIS — E785 Hyperlipidemia, unspecified: Secondary | ICD-10-CM | POA: Diagnosis not present

## 2019-03-14 DIAGNOSIS — Z79899 Other long term (current) drug therapy: Secondary | ICD-10-CM | POA: Diagnosis not present

## 2019-03-14 DIAGNOSIS — E039 Hypothyroidism, unspecified: Secondary | ICD-10-CM | POA: Diagnosis not present

## 2019-03-15 DIAGNOSIS — F333 Major depressive disorder, recurrent, severe with psychotic symptoms: Secondary | ICD-10-CM | POA: Diagnosis not present

## 2019-03-20 DIAGNOSIS — Z20828 Contact with and (suspected) exposure to other viral communicable diseases: Secondary | ICD-10-CM | POA: Diagnosis not present

## 2019-03-22 DIAGNOSIS — E1159 Type 2 diabetes mellitus with other circulatory complications: Secondary | ICD-10-CM | POA: Diagnosis not present

## 2019-03-22 DIAGNOSIS — G309 Alzheimer's disease, unspecified: Secondary | ICD-10-CM | POA: Diagnosis not present

## 2019-03-22 DIAGNOSIS — K219 Gastro-esophageal reflux disease without esophagitis: Secondary | ICD-10-CM | POA: Diagnosis not present

## 2019-03-22 DIAGNOSIS — I10 Essential (primary) hypertension: Secondary | ICD-10-CM | POA: Diagnosis not present

## 2019-03-23 DIAGNOSIS — F333 Major depressive disorder, recurrent, severe with psychotic symptoms: Secondary | ICD-10-CM | POA: Diagnosis not present

## 2019-03-24 DIAGNOSIS — Z20828 Contact with and (suspected) exposure to other viral communicable diseases: Secondary | ICD-10-CM | POA: Diagnosis not present

## 2019-03-26 DIAGNOSIS — F333 Major depressive disorder, recurrent, severe with psychotic symptoms: Secondary | ICD-10-CM | POA: Diagnosis not present

## 2019-03-27 DIAGNOSIS — Z20828 Contact with and (suspected) exposure to other viral communicable diseases: Secondary | ICD-10-CM | POA: Diagnosis not present

## 2019-03-30 DIAGNOSIS — Z794 Long term (current) use of insulin: Secondary | ICD-10-CM | POA: Diagnosis not present

## 2019-03-30 DIAGNOSIS — B351 Tinea unguium: Secondary | ICD-10-CM | POA: Diagnosis not present

## 2019-03-30 DIAGNOSIS — M6281 Muscle weakness (generalized): Secondary | ICD-10-CM | POA: Diagnosis not present

## 2019-03-30 DIAGNOSIS — E1140 Type 2 diabetes mellitus with diabetic neuropathy, unspecified: Secondary | ICD-10-CM | POA: Diagnosis not present

## 2019-03-30 DIAGNOSIS — E1159 Type 2 diabetes mellitus with other circulatory complications: Secondary | ICD-10-CM | POA: Diagnosis not present

## 2019-03-30 DIAGNOSIS — R2689 Other abnormalities of gait and mobility: Secondary | ICD-10-CM | POA: Diagnosis not present

## 2019-03-30 DIAGNOSIS — M79674 Pain in right toe(s): Secondary | ICD-10-CM | POA: Diagnosis not present

## 2019-04-02 DIAGNOSIS — Z20828 Contact with and (suspected) exposure to other viral communicable diseases: Secondary | ICD-10-CM | POA: Diagnosis not present

## 2019-04-03 DIAGNOSIS — R2689 Other abnormalities of gait and mobility: Secondary | ICD-10-CM | POA: Diagnosis not present

## 2019-04-03 DIAGNOSIS — E1159 Type 2 diabetes mellitus with other circulatory complications: Secondary | ICD-10-CM | POA: Diagnosis not present

## 2019-04-03 DIAGNOSIS — M6281 Muscle weakness (generalized): Secondary | ICD-10-CM | POA: Diagnosis not present

## 2019-04-05 DIAGNOSIS — E1159 Type 2 diabetes mellitus with other circulatory complications: Secondary | ICD-10-CM | POA: Diagnosis not present

## 2019-04-05 DIAGNOSIS — M6281 Muscle weakness (generalized): Secondary | ICD-10-CM | POA: Diagnosis not present

## 2019-04-05 DIAGNOSIS — R2689 Other abnormalities of gait and mobility: Secondary | ICD-10-CM | POA: Diagnosis not present

## 2019-04-06 DIAGNOSIS — F333 Major depressive disorder, recurrent, severe with psychotic symptoms: Secondary | ICD-10-CM | POA: Diagnosis not present

## 2019-04-06 DIAGNOSIS — E1159 Type 2 diabetes mellitus with other circulatory complications: Secondary | ICD-10-CM | POA: Diagnosis not present

## 2019-04-06 DIAGNOSIS — R2689 Other abnormalities of gait and mobility: Secondary | ICD-10-CM | POA: Diagnosis not present

## 2019-04-06 DIAGNOSIS — M6281 Muscle weakness (generalized): Secondary | ICD-10-CM | POA: Diagnosis not present

## 2019-04-09 DIAGNOSIS — M6281 Muscle weakness (generalized): Secondary | ICD-10-CM | POA: Diagnosis not present

## 2019-04-09 DIAGNOSIS — F0280 Dementia in other diseases classified elsewhere without behavioral disturbance: Secondary | ICD-10-CM | POA: Diagnosis not present

## 2019-04-09 DIAGNOSIS — R2689 Other abnormalities of gait and mobility: Secondary | ICD-10-CM | POA: Diagnosis not present

## 2019-04-09 DIAGNOSIS — I10 Essential (primary) hypertension: Secondary | ICD-10-CM | POA: Diagnosis not present

## 2019-04-09 DIAGNOSIS — G309 Alzheimer's disease, unspecified: Secondary | ICD-10-CM | POA: Diagnosis not present

## 2019-04-09 DIAGNOSIS — E1159 Type 2 diabetes mellitus with other circulatory complications: Secondary | ICD-10-CM | POA: Diagnosis not present

## 2019-04-09 DIAGNOSIS — Z20828 Contact with and (suspected) exposure to other viral communicable diseases: Secondary | ICD-10-CM | POA: Diagnosis not present

## 2019-04-10 DIAGNOSIS — G309 Alzheimer's disease, unspecified: Secondary | ICD-10-CM | POA: Diagnosis not present

## 2019-04-10 DIAGNOSIS — M6281 Muscle weakness (generalized): Secondary | ICD-10-CM | POA: Diagnosis not present

## 2019-04-10 DIAGNOSIS — F0280 Dementia in other diseases classified elsewhere without behavioral disturbance: Secondary | ICD-10-CM | POA: Diagnosis not present

## 2019-04-10 DIAGNOSIS — I10 Essential (primary) hypertension: Secondary | ICD-10-CM | POA: Diagnosis not present

## 2019-04-10 DIAGNOSIS — R2689 Other abnormalities of gait and mobility: Secondary | ICD-10-CM | POA: Diagnosis not present

## 2019-04-10 DIAGNOSIS — E1159 Type 2 diabetes mellitus with other circulatory complications: Secondary | ICD-10-CM | POA: Diagnosis not present

## 2019-04-11 DIAGNOSIS — E1159 Type 2 diabetes mellitus with other circulatory complications: Secondary | ICD-10-CM | POA: Diagnosis not present

## 2019-04-11 DIAGNOSIS — M6281 Muscle weakness (generalized): Secondary | ICD-10-CM | POA: Diagnosis not present

## 2019-04-11 DIAGNOSIS — R2689 Other abnormalities of gait and mobility: Secondary | ICD-10-CM | POA: Diagnosis not present

## 2019-04-16 DIAGNOSIS — Z20828 Contact with and (suspected) exposure to other viral communicable diseases: Secondary | ICD-10-CM | POA: Diagnosis not present

## 2019-04-19 DIAGNOSIS — F0280 Dementia in other diseases classified elsewhere without behavioral disturbance: Secondary | ICD-10-CM | POA: Diagnosis not present

## 2019-04-19 DIAGNOSIS — E1159 Type 2 diabetes mellitus with other circulatory complications: Secondary | ICD-10-CM | POA: Diagnosis not present

## 2019-04-19 DIAGNOSIS — R410 Disorientation, unspecified: Secondary | ICD-10-CM | POA: Diagnosis not present

## 2019-04-19 DIAGNOSIS — G309 Alzheimer's disease, unspecified: Secondary | ICD-10-CM | POA: Diagnosis not present

## 2019-04-23 DIAGNOSIS — Z20828 Contact with and (suspected) exposure to other viral communicable diseases: Secondary | ICD-10-CM | POA: Diagnosis not present

## 2019-04-25 DIAGNOSIS — F333 Major depressive disorder, recurrent, severe with psychotic symptoms: Secondary | ICD-10-CM | POA: Diagnosis not present

## 2019-04-27 DIAGNOSIS — Z20828 Contact with and (suspected) exposure to other viral communicable diseases: Secondary | ICD-10-CM | POA: Diagnosis not present

## 2019-05-01 DIAGNOSIS — F333 Major depressive disorder, recurrent, severe with psychotic symptoms: Secondary | ICD-10-CM | POA: Diagnosis not present

## 2019-05-02 DIAGNOSIS — Z20828 Contact with and (suspected) exposure to other viral communicable diseases: Secondary | ICD-10-CM | POA: Diagnosis not present

## 2019-05-05 DIAGNOSIS — Z79899 Other long term (current) drug therapy: Secondary | ICD-10-CM | POA: Diagnosis not present

## 2019-05-08 DIAGNOSIS — Z1159 Encounter for screening for other viral diseases: Secondary | ICD-10-CM | POA: Diagnosis not present

## 2019-05-09 DIAGNOSIS — I10 Essential (primary) hypertension: Secondary | ICD-10-CM | POA: Diagnosis not present

## 2019-05-09 DIAGNOSIS — G309 Alzheimer's disease, unspecified: Secondary | ICD-10-CM | POA: Diagnosis not present

## 2019-05-09 DIAGNOSIS — K219 Gastro-esophageal reflux disease without esophagitis: Secondary | ICD-10-CM | POA: Diagnosis not present

## 2019-05-09 DIAGNOSIS — E1159 Type 2 diabetes mellitus with other circulatory complications: Secondary | ICD-10-CM | POA: Diagnosis not present

## 2019-05-15 DIAGNOSIS — Z20828 Contact with and (suspected) exposure to other viral communicable diseases: Secondary | ICD-10-CM | POA: Diagnosis not present

## 2019-05-15 DIAGNOSIS — F333 Major depressive disorder, recurrent, severe with psychotic symptoms: Secondary | ICD-10-CM | POA: Diagnosis not present

## 2019-05-17 DIAGNOSIS — E119 Type 2 diabetes mellitus without complications: Secondary | ICD-10-CM | POA: Diagnosis not present

## 2019-05-17 DIAGNOSIS — F333 Major depressive disorder, recurrent, severe with psychotic symptoms: Secondary | ICD-10-CM | POA: Diagnosis not present

## 2019-05-17 DIAGNOSIS — F0280 Dementia in other diseases classified elsewhere without behavioral disturbance: Secondary | ICD-10-CM | POA: Diagnosis not present

## 2019-05-17 DIAGNOSIS — G309 Alzheimer's disease, unspecified: Secondary | ICD-10-CM | POA: Diagnosis not present

## 2019-05-22 DIAGNOSIS — F333 Major depressive disorder, recurrent, severe with psychotic symptoms: Secondary | ICD-10-CM | POA: Diagnosis not present

## 2019-05-23 DIAGNOSIS — Z20828 Contact with and (suspected) exposure to other viral communicable diseases: Secondary | ICD-10-CM | POA: Diagnosis not present

## 2019-05-28 DIAGNOSIS — I10 Essential (primary) hypertension: Secondary | ICD-10-CM | POA: Diagnosis not present

## 2019-05-28 DIAGNOSIS — F0280 Dementia in other diseases classified elsewhere without behavioral disturbance: Secondary | ICD-10-CM | POA: Diagnosis not present

## 2019-05-28 DIAGNOSIS — E1159 Type 2 diabetes mellitus with other circulatory complications: Secondary | ICD-10-CM | POA: Diagnosis not present

## 2019-05-28 DIAGNOSIS — G309 Alzheimer's disease, unspecified: Secondary | ICD-10-CM | POA: Diagnosis not present

## 2019-05-29 DIAGNOSIS — F333 Major depressive disorder, recurrent, severe with psychotic symptoms: Secondary | ICD-10-CM | POA: Diagnosis not present

## 2019-05-29 DIAGNOSIS — Z20828 Contact with and (suspected) exposure to other viral communicable diseases: Secondary | ICD-10-CM | POA: Diagnosis not present

## 2019-06-05 DIAGNOSIS — F333 Major depressive disorder, recurrent, severe with psychotic symptoms: Secondary | ICD-10-CM | POA: Diagnosis not present

## 2019-06-07 DIAGNOSIS — Z20828 Contact with and (suspected) exposure to other viral communicable diseases: Secondary | ICD-10-CM | POA: Diagnosis not present

## 2019-06-08 DIAGNOSIS — K219 Gastro-esophageal reflux disease without esophagitis: Secondary | ICD-10-CM | POA: Diagnosis not present

## 2019-06-08 DIAGNOSIS — E039 Hypothyroidism, unspecified: Secondary | ICD-10-CM | POA: Diagnosis not present

## 2019-06-08 DIAGNOSIS — G309 Alzheimer's disease, unspecified: Secondary | ICD-10-CM | POA: Diagnosis not present

## 2019-06-08 DIAGNOSIS — N319 Neuromuscular dysfunction of bladder, unspecified: Secondary | ICD-10-CM | POA: Diagnosis not present

## 2019-06-12 DIAGNOSIS — Z20828 Contact with and (suspected) exposure to other viral communicable diseases: Secondary | ICD-10-CM | POA: Diagnosis not present

## 2019-06-12 DIAGNOSIS — F333 Major depressive disorder, recurrent, severe with psychotic symptoms: Secondary | ICD-10-CM | POA: Diagnosis not present

## 2019-06-14 DIAGNOSIS — Z20828 Contact with and (suspected) exposure to other viral communicable diseases: Secondary | ICD-10-CM | POA: Diagnosis not present

## 2019-06-19 DIAGNOSIS — Z20828 Contact with and (suspected) exposure to other viral communicable diseases: Secondary | ICD-10-CM | POA: Diagnosis not present

## 2019-06-21 DIAGNOSIS — F0280 Dementia in other diseases classified elsewhere without behavioral disturbance: Secondary | ICD-10-CM | POA: Diagnosis not present

## 2019-06-21 DIAGNOSIS — G309 Alzheimer's disease, unspecified: Secondary | ICD-10-CM | POA: Diagnosis not present

## 2019-06-21 DIAGNOSIS — N319 Neuromuscular dysfunction of bladder, unspecified: Secondary | ICD-10-CM | POA: Diagnosis not present

## 2019-06-21 DIAGNOSIS — Z20828 Contact with and (suspected) exposure to other viral communicable diseases: Secondary | ICD-10-CM | POA: Diagnosis not present

## 2019-06-21 DIAGNOSIS — E1159 Type 2 diabetes mellitus with other circulatory complications: Secondary | ICD-10-CM | POA: Diagnosis not present

## 2019-06-22 DIAGNOSIS — F333 Major depressive disorder, recurrent, severe with psychotic symptoms: Secondary | ICD-10-CM | POA: Diagnosis not present

## 2019-06-26 DIAGNOSIS — F333 Major depressive disorder, recurrent, severe with psychotic symptoms: Secondary | ICD-10-CM | POA: Diagnosis not present

## 2019-07-03 DIAGNOSIS — F333 Major depressive disorder, recurrent, severe with psychotic symptoms: Secondary | ICD-10-CM | POA: Diagnosis not present

## 2019-07-12 DIAGNOSIS — F333 Major depressive disorder, recurrent, severe with psychotic symptoms: Secondary | ICD-10-CM | POA: Diagnosis not present

## 2019-07-13 DIAGNOSIS — F0280 Dementia in other diseases classified elsewhere without behavioral disturbance: Secondary | ICD-10-CM | POA: Diagnosis not present

## 2019-07-13 DIAGNOSIS — G309 Alzheimer's disease, unspecified: Secondary | ICD-10-CM | POA: Diagnosis not present

## 2019-07-13 DIAGNOSIS — E1159 Type 2 diabetes mellitus with other circulatory complications: Secondary | ICD-10-CM | POA: Diagnosis not present

## 2019-07-13 DIAGNOSIS — K219 Gastro-esophageal reflux disease without esophagitis: Secondary | ICD-10-CM | POA: Diagnosis not present

## 2019-07-23 DIAGNOSIS — F333 Major depressive disorder, recurrent, severe with psychotic symptoms: Secondary | ICD-10-CM | POA: Diagnosis not present

## 2019-08-07 DIAGNOSIS — F333 Major depressive disorder, recurrent, severe with psychotic symptoms: Secondary | ICD-10-CM | POA: Diagnosis not present

## 2019-08-13 DIAGNOSIS — F333 Major depressive disorder, recurrent, severe with psychotic symptoms: Secondary | ICD-10-CM | POA: Diagnosis not present

## 2019-08-21 DIAGNOSIS — E782 Mixed hyperlipidemia: Secondary | ICD-10-CM | POA: Diagnosis not present

## 2019-08-21 DIAGNOSIS — E119 Type 2 diabetes mellitus without complications: Secondary | ICD-10-CM | POA: Diagnosis not present

## 2019-08-21 DIAGNOSIS — E039 Hypothyroidism, unspecified: Secondary | ICD-10-CM | POA: Diagnosis not present

## 2019-08-21 DIAGNOSIS — G309 Alzheimer's disease, unspecified: Secondary | ICD-10-CM | POA: Diagnosis not present

## 2019-08-21 DIAGNOSIS — M1991 Primary osteoarthritis, unspecified site: Secondary | ICD-10-CM | POA: Diagnosis not present

## 2019-08-21 DIAGNOSIS — E559 Vitamin D deficiency, unspecified: Secondary | ICD-10-CM | POA: Diagnosis not present

## 2019-08-21 DIAGNOSIS — I10 Essential (primary) hypertension: Secondary | ICD-10-CM | POA: Diagnosis not present

## 2019-08-21 DIAGNOSIS — F0280 Dementia in other diseases classified elsewhere without behavioral disturbance: Secondary | ICD-10-CM | POA: Diagnosis not present

## 2019-08-21 DIAGNOSIS — E785 Hyperlipidemia, unspecified: Secondary | ICD-10-CM | POA: Diagnosis not present

## 2019-08-21 DIAGNOSIS — F333 Major depressive disorder, recurrent, severe with psychotic symptoms: Secondary | ICD-10-CM | POA: Diagnosis not present

## 2019-08-21 DIAGNOSIS — E1159 Type 2 diabetes mellitus with other circulatory complications: Secondary | ICD-10-CM | POA: Diagnosis not present

## 2019-09-04 DIAGNOSIS — F333 Major depressive disorder, recurrent, severe with psychotic symptoms: Secondary | ICD-10-CM | POA: Diagnosis not present

## 2019-09-06 DIAGNOSIS — G309 Alzheimer's disease, unspecified: Secondary | ICD-10-CM | POA: Diagnosis not present

## 2019-09-06 DIAGNOSIS — E1159 Type 2 diabetes mellitus with other circulatory complications: Secondary | ICD-10-CM | POA: Diagnosis not present

## 2019-09-06 DIAGNOSIS — K219 Gastro-esophageal reflux disease without esophagitis: Secondary | ICD-10-CM | POA: Diagnosis not present

## 2019-09-06 DIAGNOSIS — F0280 Dementia in other diseases classified elsewhere without behavioral disturbance: Secondary | ICD-10-CM | POA: Diagnosis not present

## 2019-09-06 DIAGNOSIS — I10 Essential (primary) hypertension: Secondary | ICD-10-CM | POA: Diagnosis not present

## 2019-09-17 DIAGNOSIS — K219 Gastro-esophageal reflux disease without esophagitis: Secondary | ICD-10-CM | POA: Diagnosis not present

## 2019-09-17 DIAGNOSIS — G309 Alzheimer's disease, unspecified: Secondary | ICD-10-CM | POA: Diagnosis not present

## 2019-09-17 DIAGNOSIS — R5381 Other malaise: Secondary | ICD-10-CM | POA: Diagnosis not present

## 2019-09-17 DIAGNOSIS — E1159 Type 2 diabetes mellitus with other circulatory complications: Secondary | ICD-10-CM | POA: Diagnosis not present

## 2019-09-17 DIAGNOSIS — E039 Hypothyroidism, unspecified: Secondary | ICD-10-CM | POA: Diagnosis not present

## 2019-09-17 DIAGNOSIS — F0280 Dementia in other diseases classified elsewhere without behavioral disturbance: Secondary | ICD-10-CM | POA: Diagnosis not present

## 2019-09-17 DIAGNOSIS — E782 Mixed hyperlipidemia: Secondary | ICD-10-CM | POA: Diagnosis not present

## 2019-09-21 DIAGNOSIS — E1159 Type 2 diabetes mellitus with other circulatory complications: Secondary | ICD-10-CM | POA: Diagnosis not present

## 2019-09-21 DIAGNOSIS — F0280 Dementia in other diseases classified elsewhere without behavioral disturbance: Secondary | ICD-10-CM | POA: Diagnosis not present

## 2019-09-21 DIAGNOSIS — E782 Mixed hyperlipidemia: Secondary | ICD-10-CM | POA: Diagnosis not present

## 2019-09-21 DIAGNOSIS — E039 Hypothyroidism, unspecified: Secondary | ICD-10-CM | POA: Diagnosis not present

## 2019-09-21 DIAGNOSIS — G309 Alzheimer's disease, unspecified: Secondary | ICD-10-CM | POA: Diagnosis not present

## 2019-09-21 DIAGNOSIS — E559 Vitamin D deficiency, unspecified: Secondary | ICD-10-CM | POA: Diagnosis not present

## 2019-09-21 DIAGNOSIS — E785 Hyperlipidemia, unspecified: Secondary | ICD-10-CM | POA: Diagnosis not present

## 2019-09-21 DIAGNOSIS — E119 Type 2 diabetes mellitus without complications: Secondary | ICD-10-CM | POA: Diagnosis not present

## 2019-09-21 DIAGNOSIS — M1991 Primary osteoarthritis, unspecified site: Secondary | ICD-10-CM | POA: Diagnosis not present

## 2019-09-21 DIAGNOSIS — F333 Major depressive disorder, recurrent, severe with psychotic symptoms: Secondary | ICD-10-CM | POA: Diagnosis not present

## 2019-09-21 DIAGNOSIS — I10 Essential (primary) hypertension: Secondary | ICD-10-CM | POA: Diagnosis not present

## 2019-10-04 DIAGNOSIS — I10 Essential (primary) hypertension: Secondary | ICD-10-CM | POA: Diagnosis not present

## 2019-10-04 DIAGNOSIS — K219 Gastro-esophageal reflux disease without esophagitis: Secondary | ICD-10-CM | POA: Diagnosis not present

## 2019-10-04 DIAGNOSIS — G309 Alzheimer's disease, unspecified: Secondary | ICD-10-CM | POA: Diagnosis not present

## 2019-10-04 DIAGNOSIS — E1159 Type 2 diabetes mellitus with other circulatory complications: Secondary | ICD-10-CM | POA: Diagnosis not present

## 2019-10-04 DIAGNOSIS — F0280 Dementia in other diseases classified elsewhere without behavioral disturbance: Secondary | ICD-10-CM | POA: Diagnosis not present

## 2019-10-12 DIAGNOSIS — F333 Major depressive disorder, recurrent, severe with psychotic symptoms: Secondary | ICD-10-CM | POA: Diagnosis not present

## 2019-10-17 DIAGNOSIS — N39498 Other specified urinary incontinence: Secondary | ICD-10-CM | POA: Diagnosis not present

## 2019-10-17 DIAGNOSIS — L98411 Non-pressure chronic ulcer of buttock limited to breakdown of skin: Secondary | ICD-10-CM | POA: Diagnosis not present

## 2019-10-17 DIAGNOSIS — G309 Alzheimer's disease, unspecified: Secondary | ICD-10-CM | POA: Diagnosis not present

## 2019-10-17 DIAGNOSIS — L304 Erythema intertrigo: Secondary | ICD-10-CM | POA: Diagnosis not present

## 2019-10-17 DIAGNOSIS — F0280 Dementia in other diseases classified elsewhere without behavioral disturbance: Secondary | ICD-10-CM | POA: Diagnosis not present

## 2019-10-17 DIAGNOSIS — L2089 Other atopic dermatitis: Secondary | ICD-10-CM | POA: Diagnosis not present

## 2019-10-17 DIAGNOSIS — K219 Gastro-esophageal reflux disease without esophagitis: Secondary | ICD-10-CM | POA: Diagnosis not present

## 2019-10-17 DIAGNOSIS — E1159 Type 2 diabetes mellitus with other circulatory complications: Secondary | ICD-10-CM | POA: Diagnosis not present

## 2019-10-23 DIAGNOSIS — E785 Hyperlipidemia, unspecified: Secondary | ICD-10-CM | POA: Diagnosis not present

## 2019-10-23 DIAGNOSIS — M1991 Primary osteoarthritis, unspecified site: Secondary | ICD-10-CM | POA: Diagnosis not present

## 2019-10-23 DIAGNOSIS — E782 Mixed hyperlipidemia: Secondary | ICD-10-CM | POA: Diagnosis not present

## 2019-10-23 DIAGNOSIS — E039 Hypothyroidism, unspecified: Secondary | ICD-10-CM | POA: Diagnosis not present

## 2019-10-23 DIAGNOSIS — F333 Major depressive disorder, recurrent, severe with psychotic symptoms: Secondary | ICD-10-CM | POA: Diagnosis not present

## 2019-10-23 DIAGNOSIS — G309 Alzheimer's disease, unspecified: Secondary | ICD-10-CM | POA: Diagnosis not present

## 2019-10-23 DIAGNOSIS — I10 Essential (primary) hypertension: Secondary | ICD-10-CM | POA: Diagnosis not present

## 2019-10-23 DIAGNOSIS — F0280 Dementia in other diseases classified elsewhere without behavioral disturbance: Secondary | ICD-10-CM | POA: Diagnosis not present

## 2019-10-23 DIAGNOSIS — E1159 Type 2 diabetes mellitus with other circulatory complications: Secondary | ICD-10-CM | POA: Diagnosis not present

## 2019-10-23 DIAGNOSIS — E559 Vitamin D deficiency, unspecified: Secondary | ICD-10-CM | POA: Diagnosis not present

## 2019-10-23 DIAGNOSIS — E119 Type 2 diabetes mellitus without complications: Secondary | ICD-10-CM | POA: Diagnosis not present

## 2019-10-24 DIAGNOSIS — L98411 Non-pressure chronic ulcer of buttock limited to breakdown of skin: Secondary | ICD-10-CM | POA: Diagnosis not present

## 2019-10-24 DIAGNOSIS — N39498 Other specified urinary incontinence: Secondary | ICD-10-CM | POA: Diagnosis not present

## 2019-10-24 DIAGNOSIS — L304 Erythema intertrigo: Secondary | ICD-10-CM | POA: Diagnosis not present

## 2019-10-24 DIAGNOSIS — L2089 Other atopic dermatitis: Secondary | ICD-10-CM | POA: Diagnosis not present

## 2019-10-31 DIAGNOSIS — N39498 Other specified urinary incontinence: Secondary | ICD-10-CM | POA: Diagnosis not present

## 2019-10-31 DIAGNOSIS — L2089 Other atopic dermatitis: Secondary | ICD-10-CM | POA: Diagnosis not present

## 2019-10-31 DIAGNOSIS — L98411 Non-pressure chronic ulcer of buttock limited to breakdown of skin: Secondary | ICD-10-CM | POA: Diagnosis not present

## 2019-10-31 DIAGNOSIS — L304 Erythema intertrigo: Secondary | ICD-10-CM | POA: Diagnosis not present

## 2019-11-01 DIAGNOSIS — F333 Major depressive disorder, recurrent, severe with psychotic symptoms: Secondary | ICD-10-CM | POA: Diagnosis not present

## 2019-11-05 DIAGNOSIS — E119 Type 2 diabetes mellitus without complications: Secondary | ICD-10-CM | POA: Diagnosis not present

## 2019-11-05 DIAGNOSIS — H26493 Other secondary cataract, bilateral: Secondary | ICD-10-CM | POA: Diagnosis not present

## 2019-11-05 DIAGNOSIS — H04123 Dry eye syndrome of bilateral lacrimal glands: Secondary | ICD-10-CM | POA: Diagnosis not present

## 2019-11-05 DIAGNOSIS — Z961 Presence of intraocular lens: Secondary | ICD-10-CM | POA: Diagnosis not present

## 2019-11-07 DIAGNOSIS — L98411 Non-pressure chronic ulcer of buttock limited to breakdown of skin: Secondary | ICD-10-CM | POA: Diagnosis not present

## 2019-11-07 DIAGNOSIS — N39498 Other specified urinary incontinence: Secondary | ICD-10-CM | POA: Diagnosis not present

## 2019-11-07 DIAGNOSIS — L304 Erythema intertrigo: Secondary | ICD-10-CM | POA: Diagnosis not present

## 2019-11-07 DIAGNOSIS — L2089 Other atopic dermatitis: Secondary | ICD-10-CM | POA: Diagnosis not present

## 2019-11-08 DIAGNOSIS — I10 Essential (primary) hypertension: Secondary | ICD-10-CM | POA: Diagnosis not present

## 2019-11-08 DIAGNOSIS — K219 Gastro-esophageal reflux disease without esophagitis: Secondary | ICD-10-CM | POA: Diagnosis not present

## 2019-11-08 DIAGNOSIS — G309 Alzheimer's disease, unspecified: Secondary | ICD-10-CM | POA: Diagnosis not present

## 2019-11-08 DIAGNOSIS — E1159 Type 2 diabetes mellitus with other circulatory complications: Secondary | ICD-10-CM | POA: Diagnosis not present

## 2019-11-14 DIAGNOSIS — K219 Gastro-esophageal reflux disease without esophagitis: Secondary | ICD-10-CM | POA: Diagnosis not present

## 2019-11-14 DIAGNOSIS — L98411 Non-pressure chronic ulcer of buttock limited to breakdown of skin: Secondary | ICD-10-CM | POA: Diagnosis not present

## 2019-11-14 DIAGNOSIS — L304 Erythema intertrigo: Secondary | ICD-10-CM | POA: Diagnosis not present

## 2019-11-14 DIAGNOSIS — F0280 Dementia in other diseases classified elsewhere without behavioral disturbance: Secondary | ICD-10-CM | POA: Diagnosis not present

## 2019-11-14 DIAGNOSIS — E1159 Type 2 diabetes mellitus with other circulatory complications: Secondary | ICD-10-CM | POA: Diagnosis not present

## 2019-11-14 DIAGNOSIS — L2089 Other atopic dermatitis: Secondary | ICD-10-CM | POA: Diagnosis not present

## 2019-11-14 DIAGNOSIS — N39498 Other specified urinary incontinence: Secondary | ICD-10-CM | POA: Diagnosis not present

## 2019-11-14 DIAGNOSIS — G309 Alzheimer's disease, unspecified: Secondary | ICD-10-CM | POA: Diagnosis not present

## 2019-11-14 DIAGNOSIS — I10 Essential (primary) hypertension: Secondary | ICD-10-CM | POA: Diagnosis not present

## 2019-11-15 DIAGNOSIS — F333 Major depressive disorder, recurrent, severe with psychotic symptoms: Secondary | ICD-10-CM | POA: Diagnosis not present

## 2019-11-17 DIAGNOSIS — R1110 Vomiting, unspecified: Secondary | ICD-10-CM | POA: Diagnosis not present

## 2019-11-19 DIAGNOSIS — E782 Mixed hyperlipidemia: Secondary | ICD-10-CM | POA: Diagnosis not present

## 2019-11-19 DIAGNOSIS — E119 Type 2 diabetes mellitus without complications: Secondary | ICD-10-CM | POA: Diagnosis not present

## 2019-11-19 DIAGNOSIS — F0280 Dementia in other diseases classified elsewhere without behavioral disturbance: Secondary | ICD-10-CM | POA: Diagnosis not present

## 2019-11-19 DIAGNOSIS — F333 Major depressive disorder, recurrent, severe with psychotic symptoms: Secondary | ICD-10-CM | POA: Diagnosis not present

## 2019-11-19 DIAGNOSIS — E1159 Type 2 diabetes mellitus with other circulatory complications: Secondary | ICD-10-CM | POA: Diagnosis not present

## 2019-11-19 DIAGNOSIS — G309 Alzheimer's disease, unspecified: Secondary | ICD-10-CM | POA: Diagnosis not present

## 2019-11-19 DIAGNOSIS — M1991 Primary osteoarthritis, unspecified site: Secondary | ICD-10-CM | POA: Diagnosis not present

## 2019-11-19 DIAGNOSIS — E039 Hypothyroidism, unspecified: Secondary | ICD-10-CM | POA: Diagnosis not present

## 2019-11-19 DIAGNOSIS — E785 Hyperlipidemia, unspecified: Secondary | ICD-10-CM | POA: Diagnosis not present

## 2019-11-19 DIAGNOSIS — E559 Vitamin D deficiency, unspecified: Secondary | ICD-10-CM | POA: Diagnosis not present

## 2019-11-19 DIAGNOSIS — I10 Essential (primary) hypertension: Secondary | ICD-10-CM | POA: Diagnosis not present

## 2019-11-21 DIAGNOSIS — L2089 Other atopic dermatitis: Secondary | ICD-10-CM | POA: Diagnosis not present

## 2019-11-21 DIAGNOSIS — R1110 Vomiting, unspecified: Secondary | ICD-10-CM | POA: Diagnosis not present

## 2019-11-21 DIAGNOSIS — R404 Transient alteration of awareness: Secondary | ICD-10-CM | POA: Diagnosis not present

## 2019-11-21 DIAGNOSIS — R000 Tachycardia, unspecified: Secondary | ICD-10-CM | POA: Diagnosis not present

## 2019-11-21 DIAGNOSIS — R1084 Generalized abdominal pain: Secondary | ICD-10-CM | POA: Diagnosis not present

## 2019-11-21 DIAGNOSIS — R0902 Hypoxemia: Secondary | ICD-10-CM | POA: Diagnosis not present

## 2019-11-21 DIAGNOSIS — L98411 Non-pressure chronic ulcer of buttock limited to breakdown of skin: Secondary | ICD-10-CM | POA: Diagnosis not present

## 2019-11-21 DIAGNOSIS — N39498 Other specified urinary incontinence: Secondary | ICD-10-CM | POA: Diagnosis not present

## 2019-11-21 DIAGNOSIS — R52 Pain, unspecified: Secondary | ICD-10-CM | POA: Diagnosis not present

## 2019-11-21 DIAGNOSIS — L304 Erythema intertrigo: Secondary | ICD-10-CM | POA: Diagnosis not present

## 2019-11-21 DIAGNOSIS — F333 Major depressive disorder, recurrent, severe with psychotic symptoms: Secondary | ICD-10-CM | POA: Diagnosis not present

## 2019-11-22 ENCOUNTER — Inpatient Hospital Stay (HOSPITAL_COMMUNITY)
Admission: EM | Admit: 2019-11-22 | Discharge: 2019-12-01 | DRG: 871 | Disposition: A | Discharge status: Skilled Nursing Facility | Payer: Medicare Other | Source: Skilled Nursing Facility | Attending: Internal Medicine | Admitting: Internal Medicine

## 2019-11-22 ENCOUNTER — Inpatient Hospital Stay (HOSPITAL_COMMUNITY): Payer: Medicare Other

## 2019-11-22 ENCOUNTER — Other Ambulatory Visit: Payer: Self-pay

## 2019-11-22 ENCOUNTER — Encounter (HOSPITAL_COMMUNITY): Payer: Self-pay | Admitting: Internal Medicine

## 2019-11-22 DIAGNOSIS — D62 Acute posthemorrhagic anemia: Secondary | ICD-10-CM | POA: Diagnosis present

## 2019-11-22 DIAGNOSIS — R195 Other fecal abnormalities: Secondary | ICD-10-CM | POA: Diagnosis not present

## 2019-11-22 DIAGNOSIS — R1084 Generalized abdominal pain: Secondary | ICD-10-CM | POA: Diagnosis not present

## 2019-11-22 DIAGNOSIS — E870 Hyperosmolality and hypernatremia: Secondary | ICD-10-CM | POA: Diagnosis present

## 2019-11-22 DIAGNOSIS — A419 Sepsis, unspecified organism: Secondary | ICD-10-CM | POA: Diagnosis not present

## 2019-11-22 DIAGNOSIS — R32 Unspecified urinary incontinence: Secondary | ICD-10-CM | POA: Diagnosis present

## 2019-11-22 DIAGNOSIS — N179 Acute kidney failure, unspecified: Secondary | ICD-10-CM | POA: Diagnosis not present

## 2019-11-22 DIAGNOSIS — Z888 Allergy status to other drugs, medicaments and biological substances status: Secondary | ICD-10-CM

## 2019-11-22 DIAGNOSIS — E876 Hypokalemia: Secondary | ICD-10-CM | POA: Diagnosis not present

## 2019-11-22 DIAGNOSIS — N200 Calculus of kidney: Secondary | ICD-10-CM | POA: Diagnosis present

## 2019-11-22 DIAGNOSIS — F0150 Vascular dementia without behavioral disturbance: Secondary | ICD-10-CM | POA: Diagnosis present

## 2019-11-22 DIAGNOSIS — J302 Other seasonal allergic rhinitis: Secondary | ICD-10-CM | POA: Diagnosis present

## 2019-11-22 DIAGNOSIS — N319 Neuromuscular dysfunction of bladder, unspecified: Secondary | ICD-10-CM | POA: Diagnosis present

## 2019-11-22 DIAGNOSIS — N390 Urinary tract infection, site not specified: Secondary | ICD-10-CM | POA: Diagnosis not present

## 2019-11-22 DIAGNOSIS — E785 Hyperlipidemia, unspecified: Secondary | ICD-10-CM | POA: Diagnosis present

## 2019-11-22 DIAGNOSIS — N170 Acute kidney failure with tubular necrosis: Secondary | ICD-10-CM | POA: Diagnosis present

## 2019-11-22 DIAGNOSIS — K5900 Constipation, unspecified: Secondary | ICD-10-CM | POA: Diagnosis present

## 2019-11-22 DIAGNOSIS — E119 Type 2 diabetes mellitus without complications: Secondary | ICD-10-CM | POA: Diagnosis present

## 2019-11-22 DIAGNOSIS — R6521 Severe sepsis with septic shock: Secondary | ICD-10-CM

## 2019-11-22 DIAGNOSIS — K920 Hematemesis: Secondary | ICD-10-CM | POA: Diagnosis present

## 2019-11-22 DIAGNOSIS — F0280 Dementia in other diseases classified elsewhere without behavioral disturbance: Secondary | ICD-10-CM | POA: Diagnosis present

## 2019-11-22 DIAGNOSIS — F333 Major depressive disorder, recurrent, severe with psychotic symptoms: Secondary | ICD-10-CM | POA: Diagnosis present

## 2019-11-22 DIAGNOSIS — K922 Gastrointestinal hemorrhage, unspecified: Secondary | ICD-10-CM | POA: Diagnosis not present

## 2019-11-22 DIAGNOSIS — F0390 Unspecified dementia without behavioral disturbance: Secondary | ICD-10-CM | POA: Diagnosis not present

## 2019-11-22 DIAGNOSIS — R54 Age-related physical debility: Secondary | ICD-10-CM | POA: Diagnosis not present

## 2019-11-22 DIAGNOSIS — I071 Rheumatic tricuspid insufficiency: Secondary | ICD-10-CM | POA: Diagnosis present

## 2019-11-22 DIAGNOSIS — E860 Dehydration: Secondary | ICD-10-CM | POA: Diagnosis present

## 2019-11-22 DIAGNOSIS — E8809 Other disorders of plasma-protein metabolism, not elsewhere classified: Secondary | ICD-10-CM | POA: Diagnosis present

## 2019-11-22 DIAGNOSIS — D539 Nutritional anemia, unspecified: Secondary | ICD-10-CM | POA: Diagnosis present

## 2019-11-22 DIAGNOSIS — R4182 Altered mental status, unspecified: Secondary | ICD-10-CM | POA: Diagnosis not present

## 2019-11-22 DIAGNOSIS — I358 Other nonrheumatic aortic valve disorders: Secondary | ICD-10-CM | POA: Diagnosis present

## 2019-11-22 DIAGNOSIS — I361 Nonrheumatic tricuspid (valve) insufficiency: Secondary | ICD-10-CM | POA: Diagnosis not present

## 2019-11-22 DIAGNOSIS — I10 Essential (primary) hypertension: Secondary | ICD-10-CM | POA: Diagnosis present

## 2019-11-22 DIAGNOSIS — E8770 Fluid overload, unspecified: Secondary | ICD-10-CM | POA: Diagnosis not present

## 2019-11-22 DIAGNOSIS — Z87442 Personal history of urinary calculi: Secondary | ICD-10-CM | POA: Diagnosis not present

## 2019-11-22 DIAGNOSIS — L89313 Pressure ulcer of right buttock, stage 3: Secondary | ICD-10-CM | POA: Diagnosis present

## 2019-11-22 DIAGNOSIS — E875 Hyperkalemia: Secondary | ICD-10-CM | POA: Diagnosis present

## 2019-11-22 DIAGNOSIS — R6520 Severe sepsis without septic shock: Secondary | ICD-10-CM | POA: Diagnosis not present

## 2019-11-22 DIAGNOSIS — G309 Alzheimer's disease, unspecified: Secondary | ICD-10-CM | POA: Diagnosis present

## 2019-11-22 DIAGNOSIS — Z789 Other specified health status: Secondary | ICD-10-CM

## 2019-11-22 DIAGNOSIS — Z90710 Acquired absence of both cervix and uterus: Secondary | ICD-10-CM

## 2019-11-22 DIAGNOSIS — I110 Hypertensive heart disease with heart failure: Secondary | ICD-10-CM | POA: Diagnosis present

## 2019-11-22 DIAGNOSIS — K2960 Other gastritis without bleeding: Secondary | ICD-10-CM | POA: Diagnosis present

## 2019-11-22 DIAGNOSIS — L89154 Pressure ulcer of sacral region, stage 4: Secondary | ICD-10-CM | POA: Diagnosis not present

## 2019-11-22 DIAGNOSIS — E1159 Type 2 diabetes mellitus with other circulatory complications: Secondary | ICD-10-CM | POA: Diagnosis present

## 2019-11-22 DIAGNOSIS — R52 Pain, unspecified: Secondary | ICD-10-CM

## 2019-11-22 DIAGNOSIS — R059 Cough, unspecified: Secondary | ICD-10-CM | POA: Diagnosis not present

## 2019-11-22 DIAGNOSIS — E039 Hypothyroidism, unspecified: Secondary | ICD-10-CM | POA: Diagnosis present

## 2019-11-22 DIAGNOSIS — I9589 Other hypotension: Secondary | ICD-10-CM | POA: Diagnosis not present

## 2019-11-22 DIAGNOSIS — K5904 Chronic idiopathic constipation: Secondary | ICD-10-CM | POA: Diagnosis present

## 2019-11-22 DIAGNOSIS — M1990 Unspecified osteoarthritis, unspecified site: Secondary | ICD-10-CM | POA: Diagnosis present

## 2019-11-22 DIAGNOSIS — F32A Depression, unspecified: Secondary | ICD-10-CM | POA: Diagnosis present

## 2019-11-22 DIAGNOSIS — Z960 Presence of urogenital implants: Secondary | ICD-10-CM | POA: Diagnosis present

## 2019-11-22 DIAGNOSIS — R1013 Epigastric pain: Secondary | ICD-10-CM | POA: Diagnosis not present

## 2019-11-22 DIAGNOSIS — I5031 Acute diastolic (congestive) heart failure: Secondary | ICD-10-CM | POA: Diagnosis present

## 2019-11-22 DIAGNOSIS — R58 Hemorrhage, not elsewhere classified: Secondary | ICD-10-CM

## 2019-11-22 DIAGNOSIS — K2100 Gastro-esophageal reflux disease with esophagitis, without bleeding: Secondary | ICD-10-CM | POA: Diagnosis present

## 2019-11-22 DIAGNOSIS — M2550 Pain in unspecified joint: Secondary | ICD-10-CM | POA: Diagnosis not present

## 2019-11-22 DIAGNOSIS — L89320 Pressure ulcer of left buttock, unstageable: Secondary | ICD-10-CM | POA: Diagnosis present

## 2019-11-22 DIAGNOSIS — M1991 Primary osteoarthritis, unspecified site: Secondary | ICD-10-CM | POA: Diagnosis present

## 2019-11-22 DIAGNOSIS — D649 Anemia, unspecified: Secondary | ICD-10-CM | POA: Diagnosis present

## 2019-11-22 DIAGNOSIS — R609 Edema, unspecified: Secondary | ICD-10-CM | POA: Diagnosis not present

## 2019-11-22 DIAGNOSIS — R7989 Other specified abnormal findings of blood chemistry: Secondary | ICD-10-CM | POA: Diagnosis present

## 2019-11-22 DIAGNOSIS — F99 Mental disorder, not otherwise specified: Secondary | ICD-10-CM | POA: Diagnosis not present

## 2019-11-22 DIAGNOSIS — D500 Iron deficiency anemia secondary to blood loss (chronic): Secondary | ICD-10-CM

## 2019-11-22 DIAGNOSIS — L89159 Pressure ulcer of sacral region, unspecified stage: Secondary | ICD-10-CM | POA: Diagnosis not present

## 2019-11-22 DIAGNOSIS — M21371 Foot drop, right foot: Secondary | ICD-10-CM | POA: Diagnosis present

## 2019-11-22 DIAGNOSIS — K219 Gastro-esophageal reflux disease without esophagitis: Secondary | ICD-10-CM | POA: Diagnosis present

## 2019-11-22 DIAGNOSIS — Z20822 Contact with and (suspected) exposure to covid-19: Secondary | ICD-10-CM | POA: Diagnosis present

## 2019-11-22 DIAGNOSIS — Z7401 Bed confinement status: Secondary | ICD-10-CM

## 2019-11-22 DIAGNOSIS — J9690 Respiratory failure, unspecified, unspecified whether with hypoxia or hypercapnia: Secondary | ICD-10-CM | POA: Diagnosis not present

## 2019-11-22 DIAGNOSIS — G4700 Insomnia, unspecified: Secondary | ICD-10-CM | POA: Diagnosis present

## 2019-11-22 DIAGNOSIS — I82622 Acute embolism and thrombosis of deep veins of left upper extremity: Secondary | ICD-10-CM | POA: Diagnosis present

## 2019-11-22 DIAGNOSIS — Z87440 Personal history of urinary (tract) infections: Secondary | ICD-10-CM

## 2019-11-22 DIAGNOSIS — M21372 Foot drop, left foot: Secondary | ICD-10-CM | POA: Diagnosis present

## 2019-11-22 DIAGNOSIS — Z79899 Other long term (current) drug therapy: Secondary | ICD-10-CM

## 2019-11-22 DIAGNOSIS — J9600 Acute respiratory failure, unspecified whether with hypoxia or hypercapnia: Secondary | ICD-10-CM

## 2019-11-22 DIAGNOSIS — R5381 Other malaise: Secondary | ICD-10-CM | POA: Diagnosis not present

## 2019-11-22 DIAGNOSIS — Z66 Do not resuscitate: Secondary | ICD-10-CM | POA: Diagnosis present

## 2019-11-22 DIAGNOSIS — L8990 Pressure ulcer of unspecified site, unspecified stage: Secondary | ICD-10-CM | POA: Insufficient documentation

## 2019-11-22 DIAGNOSIS — Z452 Encounter for adjustment and management of vascular access device: Secondary | ICD-10-CM | POA: Diagnosis not present

## 2019-11-22 DIAGNOSIS — Z7982 Long term (current) use of aspirin: Secondary | ICD-10-CM

## 2019-11-22 LAB — URINALYSIS, ROUTINE W REFLEX MICROSCOPIC
Bilirubin Urine: NEGATIVE
Glucose, UA: NEGATIVE mg/dL
Ketones, ur: 5 mg/dL — AB
Nitrite: NEGATIVE
Protein, ur: 100 mg/dL — AB
RBC / HPF: >50 RBC/hpf — ABNORMAL HIGH (ref 0–5)
Specific Gravity, Urine: 1.02 (ref 1.005–1.030)
Squamous Epithelial / HPF: >50 — ABNORMAL HIGH (ref 0–5)
WBC, UA: >50 WBC/hpf — ABNORMAL HIGH (ref 0–5)
pH: 5 (ref 5.0–8.0)

## 2019-11-22 LAB — CBC
HCT: 31.4 % — ABNORMAL LOW (ref 36.0–46.0)
HCT: 27.1 % — ABNORMAL LOW (ref 36.0–46.0)
Hemoglobin: 9.5 g/dL — ABNORMAL LOW (ref 12.0–15.0)
Hemoglobin: 8.7 g/dL — ABNORMAL LOW (ref 12.0–15.0)
MCH: 31.1 pg (ref 26.0–34.0)
MCH: 31.9 pg (ref 26.0–34.0)
MCHC: 30.3 g/dL (ref 30.0–36.0)
MCHC: 32.1 g/dL (ref 30.0–36.0)
MCV: 103 fL — ABNORMAL HIGH (ref 80.0–100.0)
MCV: 99.3 fL (ref 80.0–100.0)
Platelets: 310 10*3/uL (ref 150–400)
Platelets: 291 10*3/uL (ref 150–400)
RBC: 3.05 MIL/uL — ABNORMAL LOW (ref 3.87–5.11)
RBC: 2.73 MIL/uL — ABNORMAL LOW (ref 3.87–5.11)
RDW: 13.9 % (ref 11.5–15.5)
RDW: 13.9 % (ref 11.5–15.5)
WBC: 12.7 10*3/uL — ABNORMAL HIGH (ref 4.0–10.5)
WBC: 12.2 10*3/uL — ABNORMAL HIGH (ref 4.0–10.5)
nRBC: 0.2 % (ref 0.0–0.2)
nRBC: 0.2 % (ref 0.0–0.2)

## 2019-11-22 LAB — RESPIRATORY PANEL BY RT PCR (FLU A&B, COVID)
Influenza A by PCR: NEGATIVE
Influenza B by PCR: NEGATIVE
SARS Coronavirus 2 by RT PCR: NEGATIVE

## 2019-11-22 LAB — BASIC METABOLIC PANEL
Anion gap: 21 — ABNORMAL HIGH (ref 5–15)
Anion gap: 18 — ABNORMAL HIGH (ref 5–15)
BUN: 142 mg/dL — ABNORMAL HIGH (ref 8–23)
BUN: 134 mg/dL — ABNORMAL HIGH (ref 8–23)
CO2: 14 mmol/L — ABNORMAL LOW (ref 22–32)
CO2: 14 mmol/L — ABNORMAL LOW (ref 22–32)
Calcium: 8.6 mg/dL — ABNORMAL LOW (ref 8.9–10.3)
Calcium: 7.7 mg/dL — ABNORMAL LOW (ref 8.9–10.3)
Chloride: 101 mmol/L (ref 98–111)
Chloride: 105 mmol/L (ref 98–111)
Creatinine, Ser: 4.39 mg/dL — ABNORMAL HIGH (ref 0.44–1.00)
Creatinine, Ser: 3.71 mg/dL — ABNORMAL HIGH (ref 0.44–1.00)
GFR, Estimated: 9 mL/min — ABNORMAL LOW (ref >60)
GFR, Estimated: 11 mL/min — ABNORMAL LOW (ref >60)
Glucose, Bld: 144 mg/dL — ABNORMAL HIGH (ref 70–99)
Glucose, Bld: 225 mg/dL — ABNORMAL HIGH (ref 70–99)
Potassium: 6.9 mmol/L (ref 3.5–5.1)
Potassium: 5.8 mmol/L — ABNORMAL HIGH (ref 3.5–5.1)
Sodium: 136 mmol/L (ref 135–145)
Sodium: 137 mmol/L (ref 135–145)

## 2019-11-22 LAB — LACTIC ACID, PLASMA
Lactic Acid, Venous: 4.5 mmol/L (ref 0.5–1.9)
Lactic Acid, Venous: 3.3 mmol/L (ref 0.5–1.9)
Lactic Acid, Venous: 1.4 mmol/L (ref 0.5–1.9)

## 2019-11-22 LAB — CBC WITH DIFFERENTIAL/PLATELET
Abs Immature Granulocytes: 0.34 10*3/uL — ABNORMAL HIGH (ref 0.00–0.07)
Basophils Absolute: 0.1 10*3/uL (ref 0.0–0.1)
Basophils Relative: 0 %
Eosinophils Absolute: 0 10*3/uL (ref 0.0–0.5)
Eosinophils Relative: 0 %
HCT: 35.9 % — ABNORMAL LOW (ref 36.0–46.0)
Hemoglobin: 11 g/dL — ABNORMAL LOW (ref 12.0–15.0)
Immature Granulocytes: 2 %
Lymphocytes Relative: 5 %
Lymphs Abs: 1 10*3/uL (ref 0.7–4.0)
MCH: 31.9 pg (ref 26.0–34.0)
MCHC: 30.6 g/dL (ref 30.0–36.0)
MCV: 104.1 fL — ABNORMAL HIGH (ref 80.0–100.0)
Monocytes Absolute: 1.2 10*3/uL — ABNORMAL HIGH (ref 0.1–1.0)
Monocytes Relative: 6 %
Neutro Abs: 17.9 10*3/uL — ABNORMAL HIGH (ref 1.7–7.7)
Neutrophils Relative %: 87 %
Platelets: 443 10*3/uL — ABNORMAL HIGH (ref 150–400)
RBC: 3.45 MIL/uL — ABNORMAL LOW (ref 3.87–5.11)
RDW: 13.9 % (ref 11.5–15.5)
WBC: 20.5 10*3/uL — ABNORMAL HIGH (ref 4.0–10.5)
nRBC: 0.5 % — ABNORMAL HIGH (ref 0.0–0.2)

## 2019-11-22 LAB — ABO/RH: ABO/RH(D): B NEG

## 2019-11-22 LAB — LIPASE, BLOOD: Lipase: 63 U/L — ABNORMAL HIGH (ref 11–51)

## 2019-11-22 LAB — POC OCCULT BLOOD, ED: Fecal Occult Bld: POSITIVE — AB

## 2019-11-22 MED ORDER — MORPHINE SULFATE (PF) 2 MG/ML IV SOLN
0.5000 mg | Freq: Once | INTRAVENOUS | Status: AC
  Administered 2019-11-22: 0.5 mg via INTRAVENOUS

## 2019-11-22 MED ORDER — SODIUM CHLORIDE 0.9 % IV SOLN
250.0000 mL | INTRAVENOUS | Status: DC
  Administered 2019-11-22 – 2019-11-23 (×3): 250 mL via INTRAVENOUS

## 2019-11-22 MED ORDER — NOREPINEPHRINE 4 MG/250ML-% IV SOLN
2.0000 ug/min | INTRAVENOUS | Status: DC
  Administered 2019-11-22: 2 ug/min via INTRAVENOUS
  Administered 2019-11-22: 10 ug/min via INTRAVENOUS
  Administered 2019-11-22: 2 ug/min via INTRAVENOUS
  Administered 2019-11-23: 10 ug/min via INTRAVENOUS
  Filled 2019-11-22 (×3): qty 250

## 2019-11-22 MED ORDER — PANTOPRAZOLE SODIUM 40 MG IV SOLR
40.0000 mg | Freq: Once | INTRAVENOUS | Status: AC
  Administered 2019-11-22: 40 mg via INTRAVENOUS
  Filled 2019-11-22: qty 40

## 2019-11-22 MED ORDER — ONDANSETRON HCL 4 MG/2ML IJ SOLN
4.0000 mg | Freq: Once | INTRAMUSCULAR | Status: AC
  Administered 2019-11-22: 4 mg via INTRAVENOUS
  Filled 2019-11-22: qty 2

## 2019-11-22 MED ORDER — LACTATED RINGERS IV BOLUS
1000.0000 mL | Freq: Once | INTRAVENOUS | Status: AC
  Administered 2019-11-22: 1000 mL via INTRAVENOUS

## 2019-11-22 MED ORDER — SODIUM BICARBONATE 8.4 % IV SOLN
50.0000 meq | Freq: Once | INTRAVENOUS | Status: AC
  Administered 2019-11-22: 50 meq via INTRAVENOUS
  Filled 2019-11-22: qty 50

## 2019-11-22 MED ORDER — PANTOPRAZOLE SODIUM 40 MG IV SOLR: 40.0000 mg | Freq: Two times a day (BID) | INTRAVENOUS | Status: DC

## 2019-11-22 MED ORDER — SODIUM CHLORIDE 0.9 % IV SOLN
1.0000 g | INTRAVENOUS | Status: DC
  Administered 2019-11-22 – 2019-11-25 (×4): 1 g via INTRAVENOUS
  Filled 2019-11-22 (×5): qty 1

## 2019-11-22 MED ORDER — SODIUM ZIRCONIUM CYCLOSILICATE 5 G PO PACK: 5.0000 g | PACK | Freq: Once | ORAL | Status: DC

## 2019-11-22 MED ORDER — PROMETHAZINE HCL 25 MG/ML IJ SOLN
12.5000 mg | Freq: Four times a day (QID) | INTRAMUSCULAR | Status: DC | PRN
  Administered 2019-11-22: 12.5 mg via INTRAVENOUS
  Filled 2019-11-22: qty 1

## 2019-11-22 MED ORDER — ONDANSETRON HCL 4 MG PO TABS: 4.0000 mg | ORAL_TABLET | Freq: Four times a day (QID) | ORAL | Status: DC | PRN

## 2019-11-22 MED ORDER — STERILE WATER FOR INJECTION IV SOLN
INTRAVENOUS | Status: DC
  Filled 2019-11-22: qty 850
  Filled 2019-11-22: qty 150
  Filled 2019-11-22 (×2): qty 850
  Filled 2019-11-22: qty 150
  Filled 2019-11-22: qty 850

## 2019-11-22 MED ORDER — SODIUM CHLORIDE 0.9 % IV BOLUS
1000.0000 mL | Freq: Once | INTRAVENOUS | Status: AC
  Administered 2019-11-22: 1000 mL via INTRAVENOUS

## 2019-11-22 MED ORDER — SODIUM CHLORIDE 0.9 % IV SOLN: INTRAVENOUS | Status: DC

## 2019-11-22 MED ORDER — SODIUM CHLORIDE 0.9 % IV SOLN: Freq: Once | INTRAVENOUS | Status: AC

## 2019-11-22 MED ORDER — SODIUM CHLORIDE 0.9 % IV SOLN
1.0000 g | Freq: Once | INTRAVENOUS | Status: AC
  Administered 2019-11-22: 1 g via INTRAVENOUS
  Filled 2019-11-22: qty 10

## 2019-11-22 MED ORDER — MORPHINE SULFATE (PF) 2 MG/ML IV SOLN
1.0000 mg | Freq: Once | INTRAVENOUS | Status: DC
  Filled 2019-11-22: qty 1

## 2019-11-22 MED ORDER — SODIUM CHLORIDE 0.9 % IV SOLN
8.0000 mg/h | INTRAVENOUS | Status: DC
  Administered 2019-11-22 – 2019-11-24 (×6): 8 mg/h via INTRAVENOUS
  Filled 2019-11-22 (×7): qty 80

## 2019-11-22 MED ORDER — VANCOMYCIN HCL IN DEXTROSE 1-5 GM/200ML-% IV SOLN
1000.0000 mg | Freq: Once | INTRAVENOUS | Status: AC
  Administered 2019-11-22: 1000 mg via INTRAVENOUS
  Filled 2019-11-22: qty 200

## 2019-11-22 MED ORDER — ONDANSETRON HCL 4 MG/2ML IJ SOLN
4.0000 mg | Freq: Four times a day (QID) | INTRAMUSCULAR | Status: DC | PRN
  Administered 2019-11-22: 4 mg via INTRAVENOUS
  Filled 2019-11-22: qty 2

## 2019-11-22 MED ORDER — NOREPINEPHRINE 4 MG/250ML-% IV SOLN
0.0000 ug/min | INTRAVENOUS | Status: DC
Start: 2019-11-22 — End: 2019-11-22

## 2019-11-22 MED ORDER — SENNA 8.6 MG PO TABS
2.0000 | ORAL_TABLET | Freq: Every day | ORAL | Status: DC
  Administered 2019-11-25 – 2019-11-30 (×3): 17.2 mg via ORAL
  Filled 2019-11-22 (×5): qty 2

## 2019-11-22 MED ORDER — INSULIN ASPART 100 UNIT/ML IV SOLN
10.0000 [IU] | Freq: Once | INTRAVENOUS | Status: AC
  Administered 2019-11-22: 10 [IU] via INTRAVENOUS
  Filled 2019-11-22: qty 0.1

## 2019-11-22 MED ORDER — SODIUM CHLORIDE 0.9 % IV SOLN
80.0000 mg | Freq: Once | INTRAVENOUS | Status: AC
  Administered 2019-11-22: 80 mg via INTRAVENOUS
  Filled 2019-11-22: qty 80

## 2019-11-22 MED ORDER — POLYVINYL ALCOHOL 1.4 % OP SOLN
1.0000 [drp] | Freq: Three times a day (TID) | OPHTHALMIC | Status: DC
  Administered 2019-11-22 – 2019-12-01 (×23): 1 [drp] via OPHTHALMIC
  Filled 2019-11-22 (×2): qty 15

## 2019-11-22 MED ORDER — CALCIUM GLUCONATE-NACL 1-0.675 GM/50ML-% IV SOLN
1.0000 g | Freq: Once | INTRAVENOUS | Status: AC
  Administered 2019-11-22: 1000 mg via INTRAVENOUS
  Filled 2019-11-22: qty 50

## 2019-11-22 MED ORDER — VANCOMYCIN VARIABLE DOSE PER UNSTABLE RENAL FUNCTION (PHARMACIST DOSING): Status: DC

## 2019-11-22 MED ORDER — DEXTROSE 50 % IV SOLN
1.0000 | Freq: Once | INTRAVENOUS | Status: AC
  Administered 2019-11-22: 50 mL via INTRAVENOUS
  Filled 2019-11-22: qty 50

## 2019-11-22 MED ORDER — BISACODYL 10 MG RE SUPP: 10.0000 mg | Freq: Every day | RECTAL | Status: DC | PRN

## 2019-11-22 NOTE — ED Notes (Signed)
IV team at bedside 

## 2019-11-22 NOTE — Progress Notes (Signed)
A consult was received from an ED physician for vancomycin per pharmacy dosing.  The patient's profile has been reviewed for ht/wt/allergies/indication/available labs.   A one time order has been placed for vanc 1gm    Further antibiotics/pharmacy consults should be ordered by admitting physician if indicated.                       Thank you, Dolly Rias RPh 11/22/2019, 6:07 AM

## 2019-11-22 NOTE — H&P (Signed)
History and Physical    Ana Newman XFG:[id number] DOB: Aug 29, 1935 DOA: 11/22/2019  PCP: Patient, No Pcp Per  Patient coming from: Nursing home  Chief Complaint: abdominal pain  HPI: Ana Newman is a 84 y.o. female with medical history significant of HTN, HLD, Alhzeimer's dementia. Presenting with c/o of epigastric pain and hematemesis. Hx is from chart review as patient is at her baseline mentation of A&O x 2 and a poor historian. She was at her SNF with epigastric pain for 2 days. This morning. It was noted that she had coffee ground emesis and the facility called fro EMS. When asking her about her pain quality and character, she is only able to say "it hurts". No aggravating or alleviating factors noted. She was given zofran and fluids for nausea, but no treatment noted for abdominal pain.     ED Course: Lab work revealed AKI, lactic acidosis, positive FOBT. CT renal stone study show stones w/o hydronephrosis and showed a distended gallbladder w/o acute inflammation. She was given 3L fluid bolus. [facility] was called for admission.   Review of Systems:  Unable to obtain d/t mentation.   PMHx Past Medical History:  Diagnosis Date  . Acute pyelonephritis   . Alzheimer's disease ([place])    alzheimer's dementia   . Bilateral foot-drop   . Bladder outlet obstruction   . Cholelithiasis   . Constipation   . Depression   . Diabetes mellitus without complication ([place])    type 2   . Diabetic ulcer of right buttock ([place])   . Diarrhea   . Dysrhythmia    ? hx of afib postop 1/31-03/06/18   . Environmental and seasonal allergies   . Essential hypertension, benign   . Foley catheter in place   . GERD (gastroesophageal reflux disease)   . History of kidney stones   . History of urinary tract infection   . Hyperlipidemia   . Hypertension   . Hypothyroidism   . Incontinence    wears diaper   . Insomnia   . Nephrolithiasis   . Osteoarthritis   . Pyelonephritis   . Reflux esophagitis     . Rickets, active   . Sepsis ([place])   . Sepsis ([place])    hx of   . Unspecified constipation   . Unspecified psychosis   . Unspecified vitamin D deficiency   . Vitamin D deficiency     PSHx Past Surgical History:  Procedure Laterality Date  . ABDOMINAL HYSTERECTOMY    . CYSTOSCOPY W/ URETERAL STENT PLACEMENT Bilateral 05/18/2015   Procedure: CYSTOSCOPY WITH RETROGRADE PYELOGRAM/URETERAL STENT PLACEMENT;  Surgeon: Carolan Clines, MD;  Location: [facility];  Service: Urology;  Laterality: Bilateral;  . CYSTOSCOPY W/ URETERAL STENT PLACEMENT Bilateral 03/03/2018   Procedure: CYSTOSCOPY WITH RETROGRADE PYELOGRAM/URETERAL STENT PLACEMENT;  Surgeon: Franchot Gallo, MD;  Location: [facility];  Service: Urology;  Laterality: Bilateral;  . CYSTOSCOPY W/ URETERAL STENT REMOVAL Bilateral 09/12/2015   Procedure: CYSTOSCOPY WITH STENT REMOVAL;  Surgeon: Carolan Clines, MD;  Location: [facility];  Service: Urology;  Laterality: Bilateral;  1 HOUR  . CYSTOSCOPY WITH RETROGRADE PYELOGRAM, URETEROSCOPY AND STENT PLACEMENT Bilateral 07/18/2015   Procedure: CYSTOSCOPY REMOVE LEFT DOUBLE J STENT LEFT RETROGRADE PYELOGRAM, LEFT URETEROSCOPY, PYELOSCOPY, REPLACEMENT OF LEFT DOUBLE J STENT;  Surgeon: Carolan Clines, MD;  Location: [facility];  Service: Urology;  Laterality: Bilateral;  . CYSTOSCOPY WITH RETROGRADE PYELOGRAM, URETEROSCOPY AND STENT PLACEMENT Bilateral 06/08/2018   Procedure: CYSTOSCOPY WITH RETROGRADE PYELOGRAM, URETEROSCOPY AND STENT PLACEMENT;  Surgeon: Franchot Gallo, MD;  Location: [facility];  Service: Urology;  Laterality: Bilateral;  90 MINS  . HOLMIUM LASER APPLICATION Bilateral 06/03/7739   Procedure: HOLMIUM LASER OF LEFT LOWER URETERAL STONE, LEFT PYELOSCOPY, REPLACE LEFT DOUBLE J STENT REMOVE RIGHT DOUBLE J STENT RIGHT RETROGRADE PYELOGRAM, REPLACEMENT RIGHT DOUBLE J STENT ;  Surgeon: Carolan Clines, MD;  Location: [facility];  Service: Urology;  Laterality: Bilateral;  . HOLMIUM LASER  APPLICATION Bilateral 03/11/7865   Procedure: HOLMIUM LASER APPLICATION;  Surgeon: Franchot Gallo, MD;  Location: [facility];  Service: Urology;  Laterality: Bilateral;  . KNEE ARTHROSCOPY Left 1997    SocHx  reports that she has never smoked. She has never used smokeless tobacco. She reports that she does not drink alcohol and does not use drugs.  Allergies  Allergen Reactions  . 5-Alpha Reductase Inhibitors     FamHx Family History  Family history unknown: Yes    Prior to Admission medications   Medication Sig Start Date End Date Taking? Authorizing Provider  acetaminophen (TYLENOL) 500 MG tablet Take 1,000 mg by mouth daily. Pain management   Yes [provider]  aspirin 81 MG tablet Take 81 mg by mouth daily.    Yes [provider]  bisacodyl (DULCOLAX) 10 MG suppository Place 10 mg rectally daily as needed for moderate constipation.    Yes [provider]  Cholecalciferol (VITAMIN D3) 50 MCG (2000 UT) TABS Take 2,000 Units by mouth daily.   Yes [provider]  Cranberry 450 MG CAPS Take 450 mg by mouth daily.   Yes [provider]  docusate sodium (COLACE) 100 MG capsule Take 100 mg by mouth 2 (two) times daily.   Yes [provider]  linaclotide (LINZESS) 145 MCG CAPS capsule Take 145 mcg by mouth daily before breakfast.   Yes [provider]  lisinopril (PRINIVIL,ZESTRIL) 5 MG tablet Take 5 mg by mouth daily.    Yes [provider]  metoprolol tartrate (LOPRESSOR) 25 MG tablet Take 25 mg by mouth 2 (two) times daily. For HTN  Hold if B/P < 100/60 or HR < 60   Yes [provider]  Multiple Vitamin (MULTIVITAMIN WITH MINERALS) TABS tablet Take 1 tablet by mouth daily.   Yes [provider]  Nutritional Supplements (PROMOD) LIQD Take 30 mLs by mouth 2 (two) times daily.   Yes [provider]  ondansetron (ZOFRAN) 4 MG tablet Take 4 mg by mouth every 6 (six) hours as needed for nausea  or vomiting.   Yes [provider]  oxyCODONE-acetaminophen (PERCOCET/ROXICET) 5-325 MG tablet Take 1 tablet by mouth every 6 (six) hours as needed for severe pain. 03/06/18  Yes [name], MD  polyethylene glycol (MIRALAX / GLYCOLAX) packet Take 17 g by mouth every 12 (twelve) hours as needed for mild constipation.   Yes [provider]  Polyvinyl Alcohol-Povidone (FRESHKOTE) 2.7-2 % SOLN Apply 1 drop to eye 3 (three) times daily. To both eyes   Yes [provider]  rosuvastatin (CRESTOR) 10 MG tablet Take 10 mg by mouth daily.   Yes [provider]  senna (SENOKOT) 8.6 MG tablet Take 2 tablets by mouth at bedtime.   Yes [provider]  ciprofloxacin (CIPRO) 500 MG tablet Take 1 tablet (500 mg total) by mouth 2 (two) times daily. Patient not taking: Reported on 11/22/2019 06/08/18   Franchot Gallo, MD  insulin aspart (NOVOLOG) 100 UNIT/ML injection Inject 0-15 Units into the skin 3 (three) times daily  with meals. Patient not taking: Reported on 11/22/2019 03/06/18   [name], MD    Physical Exam: Vitals:   11/22/19 0605 11/22/19 0608 11/22/19 0630 11/22/19 0725  BP: (!) 71/44 (!) 86/76 (!) 76/65 (!) 79/44  Pulse: 98 (!) 101 (!) 101 98  Resp: 18 (!) 21 20 (!) 24  Temp:      TempSrc:      SpO2: 100% 100% 100% 95%  Weight:      Height:        General: 84 y.o. ill appearing female resting in bed Eyes: PERRL, normal sclera ENMT: Nares patent w/o discharge, orophaynx clear, dentition normal, ears w/o discharge/lesions/ulcers Neck: Supple, trachea midline Cardiovascular: tachy, +S1, S2, no m/g/r, equal pulses throughout Respiratory: CTABL, no w/r/r, normal WOB GI: BS+, ND, TTP RLQ and RUQ no masses noted, no organomegaly noted MSK: No e/c/c Skin: No rashes, bruises, ulcerations noted Neuro: A&O x name, place, no focal deficits Psyc: calm/cooperative  Labs on Admission: I have personally reviewed following labs and imaging  studies  CBC: Recent Labs  Lab 11/22/19 0049 11/22/19 0313  WBC 20.5* 12.7*  NEUTROABS 17.9*  --   HGB 11.0* 9.5*  HCT 35.9* 31.4*  MCV 104.1* 103.0*  PLT 443* 427   Basic Metabolic Panel: Recent Labs  Lab 11/22/19 0049 11/22/19 0313  NA 136 137  K 6.9* 5.8*  CL 101 105  CO2 14* 14*  GLUCOSE 144* 225*  BUN 142* 134*  CREATININE 4.39* 3.71*  CALCIUM 8.6* 7.7*   GFR: Estimated Creatinine Clearance: 10 mL/min (A) (by C-G formula based on SCr of 3.71 mg/dL (H)). Liver Function Tests: No results for input(s): AST, ALT, ALKPHOS, BILITOT, PROT, ALBUMIN in the last 168 hours. Recent Labs  Lab 11/22/19 0049  LIPASE 63*   No results for input(s): AMMONIA in the last 168 hours. Coagulation Profile: No results for input(s): INR, PROTIME in the last 168 hours. Cardiac Enzymes: No results for input(s): CKTOTAL, CKMB, CKMBINDEX, TROPONINI in the last 168 hours. BNP (last 3 results) No results for input(s): PROBNP in the last 8760 hours. HbA1C: No results for input(s): HGBA1C in the last 72 hours. CBG: No results for input(s): GLUCAP in the last 168 hours. Lipid Profile: No results for input(s): CHOL, HDL, LDLCALC, TRIG, CHOLHDL, LDLDIRECT in the last 72 hours. Thyroid Function Tests: No results for input(s): TSH, T4TOTAL, FREET4, T3FREE, THYROIDAB in the last 72 hours. Anemia Panel: No results for input(s): VITAMINB12, FOLATE, FERRITIN, TIBC, IRON, RETICCTPCT in the last 72 hours. Urine analysis:    Component Value Date/Time   COLORURINE YELLOW 11/22/2019 0312   APPEARANCEUR TURBID (A) 11/22/2019 0312   LABSPEC 1.020 11/22/2019 0312   PHURINE 5.0 11/22/2019 0312   GLUCOSEU NEGATIVE 11/22/2019 0312   HGBUR MODERATE (A) 11/22/2019 0312   BILIRUBINUR NEGATIVE 11/22/2019 0312   KETONESUR 5 (A) 11/22/2019 0312   PROTEINUR 100 (A) 11/22/2019 0312   NITRITE NEGATIVE 11/22/2019 0312   LEUKOCYTESUR SMALL (A) 11/22/2019 0312    Radiological Exams on Admission: DG Chest 2  View  Result Date: 11/22/2019 CLINICAL DATA:  Cough.  Possible sepsis. EXAM: CHEST - 2 VIEW COMPARISON:  03/04/2019. FINDINGS: Mediastinum and hilar structures normal. Low lung volumes. No focal infiltrate. No pleural effusion or pneumothorax. Heart size normal. Thoracic spine scoliosis and degenerative change. Mild gastric distention. IMPRESSION: No acute cardiopulmonary disease.  Mild gastric distention. Electronically Signed   By: Marcello Moores  Register   On: 11/22/2019 07:17   CT RENAL STONE STUDY  Result Date: 11/22/2019 CLINICAL DATA:  Hematuria with unknown cause EXAM: CT ABDOMEN AND PELVIS WITHOUT CONTRAST TECHNIQUE: Multidetector CT imaging of the abdomen and pelvis was performed following the standard protocol without IV contrast. COMPARISON:  03/03/2018 FINDINGS: Lower chest:  Dependent atelectasis. Hepatobiliary: No focal liver abnormality.Patulous gallbladder with dependent high-density material but no calcified stone. No superimposed fat stranding. No ductal dilatation. Pancreas: Generalized atrophy. Spleen: Unremarkable. Adrenals/Urinary Tract: Negative adrenals. Numerous bilateral renal calculi. 5 mm stone is present in the right renal pelvis and a 7 mm stone is present in the left renal pelvis. Bilateral renal cortical scarring. Symmetric perinephric stranding. No hydronephrosis. Calculi are more numerous on the left, nearly confluent in the upper pole moiety. Bladder diverticulum and mild wall thickening. No over distension currently. Stomach/Bowel:  No obstruction. No visible bowel inflammation Vascular/Lymphatic: Multifocal atherosclerotic calcification. No mass or adenopathy. Reproductive:No pathologic findings. Other: No ascites or pneumoperitoneum. Right inguinal hernia containing ascitic fluid and fat. Musculoskeletal: No acute abnormalities. L1 superior endplate fracture which subacute is interval. There is superimposed sclerosis and likely, mild height loss, and still visible fracture  plane. Advanced bilateral hip osteoarthritis. L2-S1 fusion. IMPRESSION: 1. Multiple bilateral renal calculi. Stones are seen at the bilateral renal pelvis but no associated hydronephrosis. 2. Distended gallbladder with sludge or noncalcified calculi. No superimposed acute inflammation. 3. L1 compression fracture with mild height loss, likely subacute. 4. Bladder diverticulum.  No over distension. Electronically Signed   By: Monte Fantasia M.D.   On: 11/22/2019 07:22    EKG: Independently reviewed. Sinus tach, no st change  Assessment/Plan Sepsis ?UTI     - admit to stepdown     - sepsis criteria: tachypnea, tachycardia, AKI, lactic acidosis, WBC 20.5; source presumed UTI     - she is s/p 3L bolus fluids and working on 4th liter; her pressures are still systolics in 17'C; start levophed; I consulted PCCM as well, appreciate their assistance     - continue vanc, change rocephin to cefepime for now     - Bld Cx, UCx pending.  Hyperkalemia     - fluids, insulin, lokelma, bicarb     - K+ 6.9 -> 5.8     - repeat K+ at 1000hrs  ?GIB Macrocytic anemia     - one episode of coffee ground emesis at NH per ED report     - protonix, zofran     - will consult GI     - continue q4 H&H  RLQ/epigastric abdominal pain     - CT renal stone study shows multiple bilat renal stones w/o hydronephrosis and a distended gallbladder w/o superimposed acute inflammation     - continue fluids, abx for now     - ?Check HIDA?  AKI HAGMA Azotemia     - baseline Scr: 1.1; she is 4.39 at admission, down to 3.71 last check; follow     - BUN at admission 144; down to 134 last check     - Switch fluids to sodium bicarb     - spoke with nephro; they will follow, appreciate assistance  Hx of HTN     - hold home BP meds  HLD     - hold atorvastatin for now  Hx of DM2     - no longer on DM meds     - check A1c, follow glucose  DVT prophylaxis: SCDs  Code Status: DNR confirmed with dtr Ammie Ferrier) Family Communication: With dtr by phone  Consults  called: PCCM, Nephro, GI  Admission status: Inpatient  Status is: Inpatient  Remains inpatient appropriate because:Inpatient level of care appropriate due to severity of illness   Dispo: The patient is from: Nursing Home              Anticipated d/c is to: Nursing Home              Anticipated d/c date is: > 3 days              Patient currently is not medically stable to d/c.  Jonnie Finner DO [facility]  If 7PM-7AM, please contact night-coverage [email or web address]  11/22/2019, 7:48 AM

## 2019-11-22 NOTE — ED Notes (Signed)
PCCM at bedside. °

## 2019-11-22 NOTE — Procedures (Signed)
Central Venous Catheter Insertion Procedure Note  Ana Newman  [id number]  1935-12-29  Date:11/22/19  Time:7:07 PM   Provider Performing:Iyanah Demont Viona Gilmore [name]   Procedure: Insertion of Non-tunneled Central Venous [phone]) with US guidance (72820)   Indication(s) Medication administration and Difficult access  Consent Risks of the procedure as well as the alternatives and risks of each were explained to the patient and/or caregiver.  Consent for the procedure was obtained and is signed in the bedside chart Patient gave verbal report to myself and Dr. Silas Flood. She is unable to sign and has no available proxy.   Anesthesia Topical only with 1% lidocaine   Timeout Verified patient identification, verified procedure, site/side was marked, verified correct patient position, special equipment/implants available, medications/allergies/relevant history reviewed, required imaging and test results available.  Sterile Technique Maximal sterile technique including full sterile barrier drape, hand hygiene, sterile gown, sterile gloves, mask, hair covering, sterile ultrasound probe cover (if used).  Procedure Description Area of catheter insertion was cleaned with chlorhexidine and draped in sterile fashion.  With real-time ultrasound guidance a central venous catheter was placed into the right internal jugular vein. Nonpulsatile blood flow and easy flushing noted in all ports.  The catheter was sutured in place and sterile dressing applied.  Complications/Tolerance None; patient tolerated the procedure well. Chest X-ray is ordered to verify placement for internal jugular or subclavian cannulation.   Chest x-ray is not ordered for femoral cannulation.  EBL Minimal  Specimen(s) None   Georgann Housekeeper, AGACNP-BC [place] for personal pager PCCM on call pager [phone]  11/22/2019 7:08 PM

## 2019-11-22 NOTE — ED Notes (Signed)
Date and time results received: 11/22/19 2:06 AM  (use smartphrase ".now" to insert current time)  Test: Potassium Critical Value: 6.9  Name of Provider Notified: [place]  Orders Received? Or Actions Taken?: Orders Received - See Orders for details

## 2019-11-22 NOTE — ED Notes (Signed)
CRITICAL VALUE ALERT  Critical Value:  Lactic 4.5  Date & Time Notied:  11/22/19  Provider Notified: J.Molpus  Orders Received/Actions taken:

## 2019-11-22 NOTE — Consult Note (Signed)
NAME:  Ana Newman, MRN:  [id number], DOB:  [birth year], LOS: 0 ADMISSION DATE:  11/22/2019, CONSULTATION DATE:  11/22/19 REFERRING MD:  Jonnie Finner, DOCHIEF COMPLAINT:  Abdominal pain (wailing in room)   Brief History   84 year old with recurrent UTI admitted with abdominal pain and hypotension with concern for septic shock and possible UGIB given concern for hematemesis whom we are consulted for evaluation of shock.  History of present illness   History per EMR. Patient in pain, unable to provide history, moaning in pain  Past Medical History  HTN, HLD  Significant Hospital Events   Admitted 10/21  Consults:  PCCM, GI  Procedures:  N/a, potential CVC  Significant Diagnostic Tests:  CT renal protocol: bilateral stone w/o hydro  Micro Data:  pending  Antimicrobials:  Vanc/cefepime 10/21 >>  Interim history/subjective:  As above  Objective   Blood pressure 124/74, pulse (!) 101, temperature 98 F (36.7 C), temperature source Oral, resp. rate (!) 23, height 5\' 1"  (1.549 m), weight 63.5 kg, SpO2 100 %.        Intake/Output Summary (Last 24 hours) at 11/22/2019 1240 Last data filed at 11/22/2019 0915 Gross per 24 hour  Intake 3051.8 ml  Output 100 ml  Net 2951.8 ml   Filed Weights   11/22/19 0039  Weight: 63.5 kg    Examination: General: frail, elderly, moaning in pain Abd: diffuse tenderness, guard, no rebound Resp: CTAB, NWOB CV: Tachy, regular rhythm  Resolved Hospital Problem list   n/a  Assessment & Plan:  Septic shock: Creatinine elevated, lactic acid elevated, presumed urinary source with dirty UA.  --MAP > 65, NE ordered --1L NS bolus as satting 100% on 3L [place] --Abx - cefepime and vanc (provedencia and proteus UTI in past)  Abdominal Pain: Concern for possible renal cholic/pyelo given likey UTI. Report of hematemesis raises concern for GI ulcer. --See below --Trial low dose morphine - caution with hypotension --Consider addl abdominal  imaging  Hematemesis/black emesis: Hgb drop but in setting of aggressive IV hydration, WBC and plts down slightly too which could suggest hemodilution. If bleed not present, worry about constipation and feculent emesis based on appearance. --IV protonix per GI  --Trend Hgb  84 year old frail female with multi-organ failure. DNR appropriate, prognosis is grave.   Best practice:  Per primary  Labs   CBC: Recent Labs  Lab 11/22/19 0049 11/22/19 0313 11/22/19 1150  WBC 20.5* 12.7* 12.2*  NEUTROABS 17.9*  --   --   HGB 11.0* 9.5* 8.7*  HCT 35.9* 31.4* 27.1*  MCV 104.1* 103.0* 99.3  PLT 443* 310 672    Basic Metabolic Panel: Recent Labs  Lab 11/22/19 0049 11/22/19 0313  NA 136 137  K 6.9* 5.8*  CL 101 105  CO2 14* 14*  GLUCOSE 144* 225*  BUN 142* 134*  CREATININE 4.39* 3.71*  CALCIUM 8.6* 7.7*   GFR: Estimated Creatinine Clearance: 10 mL/min (A) (by C-G formula based on SCr of 3.71 mg/dL (H)). Recent Labs  Lab 11/22/19 0049 11/22/19 0313 11/22/19 0525 11/22/19 0910 11/22/19 1150  WBC 20.5* 12.7*  --   --  12.2*  LATICACIDVEN  --  4.5* 3.3* 1.4  --     Liver Function Tests: No results for input(s): AST, ALT, ALKPHOS, BILITOT, PROT, ALBUMIN in the last 168 hours. Recent Labs  Lab 11/22/19 0049  LIPASE 63*   No results for input(s): AMMONIA in the last 168 hours.  ABG No results found  for: PHART, PCO2ART, PO2ART, HCO3, TCO2, ACIDBASEDEF, O2SAT   Coagulation Profile: No results for input(s): INR, PROTIME in the last 168 hours.  Cardiac Enzymes: No results for input(s): CKTOTAL, CKMB, CKMBINDEX, TROPONINI in the last 168 hours.  HbA1C: Hemoglobin A1C  Date/Time Value Ref Range Status  08/24/2017 12:00 AM 6.9  Final  06/17/2017 12:00 AM 6.0  Final    CBG: No results for input(s): GLUCAP in the last 168 hours.  Review of Systems:   Unable to obtain  Past Medical History  She,  has a past medical history of Acute pyelonephritis, Alzheimer's  disease ([place]), Bilateral foot-drop, Bladder outlet obstruction, Cholelithiasis, Constipation, Depression, Diabetes mellitus without complication ([place]), Diabetic ulcer of right buttock ([place]), Diarrhea, Dysrhythmia, Environmental and seasonal allergies, Essential hypertension, benign, Foley catheter in place, GERD (gastroesophageal reflux disease), History of kidney stones, History of urinary tract infection, Hyperlipidemia, Hypertension, Hypothyroidism, Incontinence, Insomnia, Nephrolithiasis, Osteoarthritis, Pyelonephritis, Reflux esophagitis, Rickets, active, Sepsis ([place]), Sepsis ([place]), Unspecified constipation, Unspecified psychosis, Unspecified vitamin D deficiency, and Vitamin D deficiency.   Surgical History    Past Surgical History:  Procedure Laterality Date  . ABDOMINAL HYSTERECTOMY    . CYSTOSCOPY W/ URETERAL STENT PLACEMENT Bilateral 05/18/2015   Procedure: CYSTOSCOPY WITH RETROGRADE PYELOGRAM/URETERAL STENT PLACEMENT;  Surgeon: Carolan Clines, MD;  Location: [facility];  Service: Urology;  Laterality: Bilateral;  . CYSTOSCOPY W/ URETERAL STENT PLACEMENT Bilateral 03/03/2018   Procedure: CYSTOSCOPY WITH RETROGRADE PYELOGRAM/URETERAL STENT PLACEMENT;  Surgeon: Franchot Gallo, MD;  Location: [facility];  Service: Urology;  Laterality: Bilateral;  . CYSTOSCOPY W/ URETERAL STENT REMOVAL Bilateral 09/12/2015   Procedure: CYSTOSCOPY WITH STENT REMOVAL;  Surgeon: Carolan Clines, MD;  Location: [facility];  Service: Urology;  Laterality: Bilateral;  1 HOUR  . CYSTOSCOPY WITH RETROGRADE PYELOGRAM, URETEROSCOPY AND STENT PLACEMENT Bilateral 07/18/2015   Procedure: CYSTOSCOPY REMOVE LEFT DOUBLE J STENT LEFT RETROGRADE PYELOGRAM, LEFT URETEROSCOPY, PYELOSCOPY, REPLACEMENT OF LEFT DOUBLE J STENT;  Surgeon: Carolan Clines, MD;  Location: [facility];  Service: Urology;  Laterality: Bilateral;  . CYSTOSCOPY WITH RETROGRADE PYELOGRAM, URETEROSCOPY AND STENT PLACEMENT Bilateral 06/08/2018   Procedure: CYSTOSCOPY  WITH RETROGRADE PYELOGRAM, URETEROSCOPY AND STENT PLACEMENT;  Surgeon: Franchot Gallo, MD;  Location: [facility];  Service: Urology;  Laterality: Bilateral;  90 MINS  . HOLMIUM LASER APPLICATION Bilateral 7/78/2423   Procedure: HOLMIUM LASER OF LEFT LOWER URETERAL STONE, LEFT PYELOSCOPY, REPLACE LEFT DOUBLE J STENT REMOVE RIGHT DOUBLE J STENT RIGHT RETROGRADE PYELOGRAM, REPLACEMENT RIGHT DOUBLE J STENT ;  Surgeon: Carolan Clines, MD;  Location: [facility];  Service: Urology;  Laterality: Bilateral;  . HOLMIUM LASER APPLICATION Bilateral 06/04/6142   Procedure: HOLMIUM LASER APPLICATION;  Surgeon: Franchot Gallo, MD;  [facility];  Service: Urology;  Laterality: Bilateral;  . KNEE ARTHROSCOPY Left 1997     Social History   reports that she has never smoked. She has never used smokeless tobacco. She reports that she does not drink alcohol and does not use drugs.   Family History   Her Family history is unknown by patient.   Allergies Allergies  Allergen Reactions  . 5-Alpha Reductase Inhibitors      Home Medications  Prior to Admission medications   Medication Sig Start Date End Date Taking? Authorizing Provider  acetaminophen (TYLENOL) 500 MG tablet Take 1,000 mg by mouth daily. Pain management   Yes [provider]  aspirin 81 MG tablet Take 81 mg by mouth daily.    Yes [provider]  bisacodyl (DULCOLAX) 10 MG suppository  Place 10 mg rectally daily as needed for moderate constipation.    Yes [provider]  Cholecalciferol (VITAMIN D3) 50 MCG (2000 UT) TABS Take 2,000 Units by mouth daily.   Yes [provider]  Cranberry 450 MG CAPS Take 450 mg by mouth daily.   Yes [provider]  docusate sodium (COLACE) 100 MG capsule Take 100 mg by mouth 2 (two) times daily.   Yes [provider]  linaclotide (LINZESS) 145 MCG CAPS capsule Take 145 mcg by mouth daily before breakfast.   Yes [provider]  lisinopril  (PRINIVIL,ZESTRIL) 5 MG tablet Take 5 mg by mouth daily.    Yes [provider]  metoprolol tartrate (LOPRESSOR) 25 MG tablet Take 25 mg by mouth 2 (two) times daily. For HTN  Hold if B/P < 100/60 or HR < 60   Yes [provider]  Multiple Vitamin (MULTIVITAMIN WITH MINERALS) TABS tablet Take 1 tablet by mouth daily.   Yes [provider]  Nutritional Supplements (PROMOD) LIQD Take 30 mLs by mouth 2 (two) times daily.   Yes [provider]  ondansetron (ZOFRAN) 4 MG tablet Take 4 mg by mouth every 6 (six) hours as needed for nausea or vomiting.   Yes [provider]  oxyCODONE-acetaminophen (PERCOCET/ROXICET) 5-325 MG tablet Take 1 tablet by mouth every 6 (six) hours as needed for severe pain. 03/06/18  Yes [name], MD  polyethylene glycol (MIRALAX / GLYCOLAX) packet Take 17 g by mouth every 12 (twelve) hours as needed for mild constipation.   Yes [provider]  Polyvinyl Alcohol-Povidone (FRESHKOTE) 2.7-2 % SOLN Apply 1 drop to eye 3 (three) times daily. To both eyes   Yes [provider]  rosuvastatin (CRESTOR) 10 MG tablet Take 10 mg by mouth daily.   Yes [provider]  senna (SENOKOT) 8.6 MG tablet Take 2 tablets by mouth at bedtime.   Yes [provider]  ciprofloxacin (CIPRO) 500 MG tablet Take 1 tablet (500 mg total) by mouth 2 (two) times daily. Patient not taking: Reported on 11/22/2019 06/08/18   Franchot Gallo, MD  insulin aspart (NOVOLOG) 100 UNIT/ML injection Inject 0-15 Units into the skin 3 (three) times daily with meals. Patient not taking: Reported on 11/22/2019 03/06/18   [name], MD     Critical care time:     CRITICAL CARE Performed by: Lanier Clam   Total critical care time: 35 minutes  Critical care time was exclusive of separately billable procedures and treating other patients.  Critical care was necessary to treat or prevent imminent or life-threatening  deterioration.  Critical care was time spent personally by me on the following activities: development of treatment plan with patient and/or surrogate as well as nursing, discussions with consultants, evaluation of patient's response to treatment, examination of patient, obtaining history from patient or surrogate, ordering and performing treatments and interventions, ordering and review of laboratory studies, ordering and review of radiographic studies, pulse oximetry and re-evaluation of patient's condition.
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
## 2019-11-22 NOTE — ED Notes (Addendum)
Pt family at bedside

## 2019-11-22 NOTE — Consult Note (Signed)
Referring Provider: ED Primary Care Physician:  Patient, No Pcp Per Primary Gastroenterologist:  [facility]  Reason for Consultation:  Upper GI bleeding  HPI: Ana Newman is a 84 y.o. female with history noted below to include Alzheimer's dementia presenting with suspected upper GI bleeding.  Patient disoriented and unable to provide any history.  Per RN, she has had 6-7 episodes of black emesis with volume of 250-500cc per episode.  No red bleeding.  Patient was hypotensive but has responded to Levophed.  On 81 mg ASA but no anticoagulation in home medications.  Colonoscopy 01/2004 normal. EGD 01/2004 with reflux changes, normal esophageal biopsies.  Past Medical History:  Diagnosis Date  . Acute pyelonephritis   . Alzheimer's disease ([place])    alzheimer's dementia   . Bilateral foot-drop   . Bladder outlet obstruction   . Cholelithiasis   . Constipation   . Depression   . Diabetes mellitus without complication ([place])    type 2   . Diabetic ulcer of right buttock ([place])   . Diarrhea   . Dysrhythmia    ? hx of afib postop 1/31-03/06/18   . Environmental and seasonal allergies   . Essential hypertension, benign   . Foley catheter in place   . GERD (gastroesophageal reflux disease)   . History of kidney stones   . History of urinary tract infection   . Hyperlipidemia   . Hypertension   . Hypothyroidism   . Incontinence    wears diaper   . Insomnia   . Nephrolithiasis   . Osteoarthritis   . Pyelonephritis   . Reflux esophagitis   . Rickets, active   . Sepsis ([place])   . Sepsis ([place])    hx of   . Unspecified constipation   . Unspecified psychosis   . Unspecified vitamin D deficiency   . Vitamin D deficiency     Past Surgical History:  Procedure Laterality Date  . ABDOMINAL HYSTERECTOMY    . CYSTOSCOPY W/ URETERAL STENT PLACEMENT Bilateral 05/18/2015   Procedure: CYSTOSCOPY WITH RETROGRADE PYELOGRAM/URETERAL STENT PLACEMENT;  Surgeon: Carolan Clines, MD;   Location: [facility];  Service: Urology;  Laterality: Bilateral;  . CYSTOSCOPY W/ URETERAL STENT PLACEMENT Bilateral 03/03/2018   Procedure: CYSTOSCOPY WITH RETROGRADE PYELOGRAM/URETERAL STENT PLACEMENT;  Surgeon: Franchot Gallo, MD;  Location: [facility];  Service: Urology;  Laterality: Bilateral;  . CYSTOSCOPY W/ URETERAL STENT REMOVAL Bilateral 09/12/2015   Procedure: CYSTOSCOPY WITH STENT REMOVAL;  Surgeon: Carolan Clines, MD;  Location: [facility];  Service: Urology;  Laterality: Bilateral;  1 HOUR  . CYSTOSCOPY WITH RETROGRADE PYELOGRAM, URETEROSCOPY AND STENT PLACEMENT Bilateral 07/18/2015   Procedure: CYSTOSCOPY REMOVE LEFT DOUBLE J STENT LEFT RETROGRADE PYELOGRAM, LEFT URETEROSCOPY, PYELOSCOPY, REPLACEMENT OF LEFT DOUBLE J STENT;  Surgeon: Carolan Clines, MD;  Location: [facility];  Service: Urology;  Laterality: Bilateral;  . CYSTOSCOPY WITH RETROGRADE PYELOGRAM, URETEROSCOPY AND STENT PLACEMENT Bilateral 06/08/2018   Procedure: CYSTOSCOPY WITH RETROGRADE PYELOGRAM, URETEROSCOPY AND STENT PLACEMENT;  Surgeon: Franchot Gallo, MD;  Location: [facility];  Service: Urology;  Laterality: Bilateral;  90 MINS  . HOLMIUM LASER APPLICATION Bilateral 07/18/735   Procedure: HOLMIUM LASER OF LEFT LOWER URETERAL STONE, LEFT PYELOSCOPY, REPLACE LEFT DOUBLE J STENT REMOVE RIGHT DOUBLE J STENT RIGHT RETROGRADE PYELOGRAM, REPLACEMENT RIGHT DOUBLE J STENT ;  Surgeon: Carolan Clines, MD;  Location: [facility];  Service: Urology;  Laterality: Bilateral;  . HOLMIUM LASER APPLICATION Bilateral 1/0/6269   Procedure: HOLMIUM LASER APPLICATION;  Surgeon: Franchot Gallo, MD;  Location: [facility];  Service: Urology;  Laterality: Bilateral;  . KNEE ARTHROSCOPY Left 1997    Prior to Admission medications   Medication Sig Start Date End Date Taking? Authorizing Provider  acetaminophen (TYLENOL) 500 MG tablet Take 1,000 mg by mouth daily. Pain management   Yes [provider]  aspirin 81 MG tablet Take 81 mg by mouth  daily.    Yes [provider]  bisacodyl (DULCOLAX) 10 MG suppository Place 10 mg rectally daily as needed for moderate constipation.    Yes [provider]  Cholecalciferol (VITAMIN D3) 50 MCG (2000 UT) TABS Take 2,000 Units by mouth daily.   Yes [provider]  Cranberry 450 MG CAPS Take 450 mg by mouth daily.   Yes [provider]  docusate sodium (COLACE) 100 MG capsule Take 100 mg by mouth 2 (two) times daily.   Yes [provider]  linaclotide (LINZESS) 145 MCG CAPS capsule Take 145 mcg by mouth daily before breakfast.   Yes [provider]  lisinopril (PRINIVIL,ZESTRIL) 5 MG tablet Take 5 mg by mouth daily.    Yes [provider]  metoprolol tartrate (LOPRESSOR) 25 MG tablet Take 25 mg by mouth 2 (two) times daily. For HTN  Hold if B/P < 100/60 or HR < 60   Yes [provider]  Multiple Vitamin (MULTIVITAMIN WITH MINERALS) TABS tablet Take 1 tablet by mouth daily.   Yes [provider]  Nutritional Supplements (PROMOD) LIQD Take 30 mLs by mouth 2 (two) times daily.   Yes [provider]  ondansetron (ZOFRAN) 4 MG tablet Take 4 mg by mouth every 6 (six) hours as needed for nausea or vomiting.   Yes [provider]  oxyCODONE-acetaminophen (PERCOCET/ROXICET) 5-325 MG tablet Take 1 tablet by mouth every 6 (six) hours as needed for severe pain. 03/06/18  Yes [name], MD  polyethylene glycol (MIRALAX / GLYCOLAX) packet Take 17 g by mouth every 12 (twelve) hours as needed for mild constipation.   Yes [provider]  Polyvinyl Alcohol-Povidone (FRESHKOTE) 2.7-2 % SOLN Apply 1 drop to eye 3 (three) times daily. To both eyes   Yes [provider]  rosuvastatin (CRESTOR) 10 MG tablet Take 10 mg by mouth daily.   Yes [provider]  senna (SENOKOT) 8.6 MG tablet Take 2 tablets by mouth at bedtime.   Yes [provider]  ciprofloxacin (CIPRO) 500 MG tablet Take 1  tablet (500 mg total) by mouth 2 (two) times daily. Patient not taking: Reported on 11/22/2019 06/08/18   Franchot Gallo, MD  insulin aspart (NOVOLOG) 100 UNIT/ML injection Inject 0-15 Units into the skin 3 (three) times daily with meals. Patient not taking: Reported on 11/22/2019 03/06/18   [name], MD    Current Facility-Administered Medications  Medication Dose Route Frequency Provider Last Rate Last Admin  . 0.9 %  sodium chloride infusion  250 mL Intravenous Continuous Kyle, Tyrone A, DO 10 mL/hr at 11/22/19 0825 250 mL at 11/22/19 0825  . ceFEPIme (MAXIPIME) 1 g in sodium chloride 0.9 % 100 mL IVPB  1 g Intravenous Q24H Shade, Christine E, RPH      . norepinephrine (LEVOPHED) 4mg  in 283mL premix infusion  2-10 mcg/min Intravenous Titrated Kyle, Tyrone A, DO 15 mL/hr at 11/22/19 1114 4 mcg/min at 11/22/19 1114  . ondansetron (ZOFRAN) tablet 4 mg  4 mg Oral Q6H PRN Marylyn Ishihara, Tyrone A, DO       Or  . ondansetron (ZOFRAN) injection  4 mg  4 mg Intravenous Q6H PRN Marylyn Ishihara, Tyrone A, DO   4 mg at 11/22/19 0916  . pantoprazole (PROTONIX) 80 mg in sodium chloride 0.9 % 100 mL (0.8 mg/mL) infusion  8 mg/hr Intravenous Continuous Rise Patience, MD 10 mL/hr at 11/22/19 0615 8 mg/hr at 11/22/19 0615  . [START ON 11/25/2019] pantoprazole (PROTONIX) injection 40 mg  40 mg Intravenous Q12H Rise Patience, MD      . promethazine (PHENERGAN) injection 12.5 mg  12.5 mg Intravenous Q6H PRN Marylyn Ishihara, Tyrone A, DO      . sodium bicarbonate 150 mEq in sterile water 1,000 mL infusion   Intravenous Continuous Kyle, Tyrone A, DO      . sodium zirconium cyclosilicate (LOKELMA) packet 5 g  5 g Oral Once Molpus, John, MD      . vancomycin variable dose per unstable renal function (pharmacist dosing)   Does not apply See admin instructions Shade, Haze Justin, [facility]       Current Outpatient Medications  Medication Sig Dispense Refill  . acetaminophen (TYLENOL) 500 MG tablet Take 1,000 mg by mouth daily. Pain  management    . aspirin 81 MG tablet Take 81 mg by mouth daily.     . bisacodyl (DULCOLAX) 10 MG suppository Place 10 mg rectally daily as needed for moderate constipation.     . Cholecalciferol (VITAMIN D3) 50 MCG (2000 UT) TABS Take 2,000 Units by mouth daily.    . Cranberry 450 MG CAPS Take 450 mg by mouth daily.    Marland Kitchen docusate sodium (COLACE) 100 MG capsule Take 100 mg by mouth 2 (two) times daily.    Marland Kitchen linaclotide (LINZESS) 145 MCG CAPS capsule Take 145 mcg by mouth daily before breakfast.    . lisinopril (PRINIVIL,ZESTRIL) 5 MG tablet Take 5 mg by mouth daily.     . metoprolol tartrate (LOPRESSOR) 25 MG tablet Take 25 mg by mouth 2 (two) times daily. For HTN  Hold if B/P < 100/60 or HR < 60    . Multiple Vitamin (MULTIVITAMIN WITH MINERALS) TABS tablet Take 1 tablet by mouth daily.    . Nutritional Supplements (PROMOD) LIQD Take 30 mLs by mouth 2 (two) times daily.    . ondansetron (ZOFRAN) 4 MG tablet Take 4 mg by mouth every 6 (six) hours as needed for nausea or vomiting.    Marland Kitchen oxyCODONE-acetaminophen (PERCOCET/ROXICET) 5-325 MG tablet Take 1 tablet by mouth every 6 (six) hours as needed for severe pain. 10 tablet 0  . polyethylene glycol (MIRALAX / GLYCOLAX) packet Take 17 g by mouth every 12 (twelve) hours as needed for mild constipation.    . Polyvinyl Alcohol-Povidone (FRESHKOTE) 2.7-2 % SOLN Apply 1 drop to eye 3 (three) times daily. To both eyes    . rosuvastatin (CRESTOR) 10 MG tablet Take 10 mg by mouth daily.    Marland Kitchen senna (SENOKOT) 8.6 MG tablet Take 2 tablets by mouth at bedtime.    . ciprofloxacin (CIPRO) 500 MG tablet Take 1 tablet (500 mg total) by mouth 2 (two) times daily. (Patient not taking: Reported on 11/22/2019) 10 tablet 0  . insulin aspart (NOVOLOG) 100 UNIT/ML injection Inject 0-15 Units into the skin 3 (three) times daily with meals. (Patient not taking: Reported on 11/22/2019)      Allergies as of 11/22/2019 - Review Complete 11/22/2019  Allergen Reaction Noted  .  5-alpha reductase inhibitors  04/12/2018    Family History  Family history unknown: Yes  Social History   Socioeconomic History  . Marital status: Married    Spouse name: Not on file  . Number of children: Not on file  . Years of education: Not on file  . Highest education level: Not on file  Occupational History  . Not on file  Tobacco Use  . Smoking status: Never Smoker  . Smokeless tobacco: Never Used  Substance and Sexual Activity  . Alcohol use: No  . Drug use: No  . Sexual activity: Not on file  Other Topics Concern  . Not on file  Social History Narrative  . Not on file   Social Determinants of Health   Financial Resource Strain:   . Difficulty of Paying Living Expenses: Not on file  Food Insecurity:   . Worried About Charity fundraiser in the Last Year: Not on file  . Ran Out of Food in the Last Year: Not on file  Transportation Needs:   . Lack of Transportation (Medical): Not on file  . Lack of Transportation (Non-Medical): Not on file  Physical Activity:   . Days of Exercise per Week: Not on file  . Minutes of Exercise per Session: Not on file  Stress:   . Feeling of Stress : Not on file  Social Connections:   . Frequency of Communication with Friends and Family: Not on file  . Frequency of Social Gatherings with Friends and Family: Not on file  . Attends Religious Services: Not on file  . Active Member of Clubs or Organizations: Not on file  . Attends Archivist Meetings: Not on file  . Marital Status: Not on file  Intimate Partner Violence:   . Fear of Current or Ex-Partner: Not on file  . Emotionally Abused: Not on file  . Physically Abused: Not on file  . Sexually Abused: Not on file    Review of Systems: Unable to obtain due to patient status  Physical Exam: Vital signs in last 24 hours: Temp:  [98 F (36.7 C)] 98 F (36.7 C) (10/21 0038) Pulse Rate:  [89-117] 109 (10/21 1100) Resp:  [16-37] 25 (10/21 1100) BP:  (63-124)/(40-105) 124/94 (10/21 1100) SpO2:  [95 %-100 %] 100 % (10/21 1100) Weight:  [63.5 kg] 63.5 kg (10/21 0039)   General:  Alert but disoriented Head: Normocephalic and atraumatic. Eyes: Sclera clear, no icterus.   Conjunctival pallor. Mouth:  No ulcerations or lesions.  Oropharynx pink & moist. Neck:  No masses or thyromegaly. Lungs: Clear throughout to auscultation.   No wheezes, crackles, or rhonchi. No evident respiratory distress. Heart:  Tachycardic with regular rhythm Abdomen: Soft and nondistended with moderate epigastric tenderness to palpation. Normal bowel sounds, without guarding or rebound.   Msk:  Symmetrical without gross deformities. Pulses:  Normal pulses noted. Extremities:   Without clubbing, cyanosis, or edema. Neurologic: Alert but disoriented Skin: Intact without significant lesions or rashes. Cervical Nodes: No significant cervical adenopathy. Psych:  Alert but appears distressed  Intake/Output from previous day: 10/20 0701 - 10/21 0700 In: 2750 [I.V.:400; IV Piggyback:2350] Out: 100 [Urine:100] Intake/Output this shift: Total I/O In: 301.8 [I.V.:1.8; IV Piggyback:300] Out: -   Lab Results: Recent Labs    11/22/19 0049 11/22/19 0313  WBC 20.5* 12.7*  HGB 11.0* 9.5*  HCT 35.9* 31.4*  PLT 443* 310   BMET Recent Labs    11/22/19 0049 11/22/19 0313  NA 136 137  K 6.9* 5.8*  CL 101 105  CO2 14* 14*  GLUCOSE 144* 225*  BUN 142* 134*  CREATININE 4.39* 3.71*  CALCIUM 8.6* 7.7*   LFT No results for input(s): PROT, ALBUMIN, AST, ALT, ALKPHOS, BILITOT, BILIDIR, IBILI in the last 72 hours. PT/INR No results for input(s): LABPROT, INR in the last 72 hours.  Studies/Results: DG Chest 2 View  Result Date: 11/22/2019 CLINICAL DATA:  Cough.  Possible sepsis. EXAM: CHEST - 2 VIEW COMPARISON:  03/04/2019. FINDINGS: Mediastinum and hilar structures normal. Low lung volumes. No focal infiltrate. No pleural effusion or pneumothorax. Heart size  normal. Thoracic spine scoliosis and degenerative change. Mild gastric distention. IMPRESSION: No acute cardiopulmonary disease.  Mild gastric distention. Electronically Signed   By: Marcello Moores  Register   On: 11/22/2019 07:17   CT RENAL STONE STUDY  Result Date: 11/22/2019 CLINICAL DATA:  Hematuria with unknown cause EXAM: CT ABDOMEN AND PELVIS WITHOUT CONTRAST TECHNIQUE: Multidetector CT imaging of the abdomen and pelvis was performed following the standard protocol without IV contrast. COMPARISON:  03/03/2018 FINDINGS: Lower chest:  Dependent atelectasis. Hepatobiliary: No focal liver abnormality.Patulous gallbladder with dependent high-density material but no calcified stone. No superimposed fat stranding. No ductal dilatation. Pancreas: Generalized atrophy. Spleen: Unremarkable. Adrenals/Urinary Tract: Negative adrenals. Numerous bilateral renal calculi. 5 mm stone is present in the right renal pelvis and a 7 mm stone is present in the left renal pelvis. Bilateral renal cortical scarring. Symmetric perinephric stranding. No hydronephrosis. Calculi are more numerous on the left, nearly confluent in the upper pole moiety. Bladder diverticulum and mild wall thickening. No over distension currently. Stomach/Bowel:  No obstruction. No visible bowel inflammation Vascular/Lymphatic: Multifocal atherosclerotic calcification. No mass or adenopathy. Reproductive:No pathologic findings. Other: No ascites or pneumoperitoneum. Right inguinal hernia containing ascitic fluid and fat. Musculoskeletal: No acute abnormalities. L1 superior endplate fracture which subacute is interval. There is superimposed sclerosis and likely, mild height loss, and still visible fracture plane. Advanced bilateral hip osteoarthritis. L2-S1 fusion. IMPRESSION: 1. Multiple bilateral renal calculi. Stones are seen at the bilateral renal pelvis but no associated hydronephrosis. 2. Distended gallbladder with sludge or noncalcified calculi. No  superimposed acute inflammation. 3. L1 compression fracture with mild height loss, likely subacute. 4. Bladder diverticulum.  No over distension. Electronically Signed   By: Monte Fantasia M.D.   On: 11/22/2019 07:22    Impression: Suspected upper GI bleeding: black emesis and blackish brown heme-positive stool reported per ED provider's note.  No red emesis, suggesting bleeding may not be active at the present time. -Hgb 8.7, decreased 11.0 yesterday -BUN elevated out of proportion of Cr, suggestive of upper GI bleeding.  BUN 134/Cr 3.71 -No known anticoagulation but patient on 81 mg ASA daily  Alzheimer's  UTI  Plan: Protonix drip.  Continue supportive care.  Continue to monitor H&H with transfusion as needed to maintain Hgb >7.  No endoscopic intervention planned unless destabilizing bleeding occurs, given patient's comorbidities.  [facility] will follow.   LOS: 0 days   Salley Slaughter  11/22/2019, 11:56 AM [phone]

## 2019-11-22 NOTE — ED Notes (Signed)
Assumed care of patient at this time, nad noted, sr up x2, bed locked and low, call bell w/I reach.  Will continue to monitor. ° °

## 2019-11-22 NOTE — Progress Notes (Signed)
I stopped by the patient's room and was able to meet with the patient's daughter, Ammie Ferrier [phone]).  I explained that I think the patient has stress gastritis or perhaps aspirin induced gastropathy without active, acute hemorrhage at this time, and that I favor conservative medical management unless our hand is forced by more acute, active bleeding.  We will review this decision daily.  The patient's daughter is in agreement and was appreciative and is happy to be contacted if we have any questions.  Cleotis Nipper, M.D. Pager [phone] If no answer or after 5 PM call [phone]

## 2019-11-22 NOTE — Progress Notes (Signed)
Pharmacy Antibiotic Note  Ana Newman is a 84 y.o. female admitted on 11/22/2019 with abdominal pain, hematemesis.  CT renal stone study show stones w/o hydronephrosis and showed a distended gallbladder w/o acute inflammation.  CXR without acute cardiopulmonary disease.  Pharmacy has been consulted for Cefepime, Vancomycin dosing for suspected urosepsis. Lactic acid 4.5 > 3.3 WBC 12.7 SCr baseline 1.1, admission 4.39 > 3.7  Plan: Cefepime 1g IV q24h Vancomycin 1g IV x1 dose given.  Further dosing pending renal function, levels. Follow up renal function, culture results, and clinical course.   Height: 5\' 1"  (154.9 cm) Weight: 63.5 kg (140 lb) IBW/kg (Calculated) : 47.8  Temp (24hrs), Avg:98 F (36.7 C), Min:98 F (36.7 C), Max:98 F (36.7 C)  Recent Labs  Lab 11/22/19 0049 11/22/19 0313 11/22/19 0525  WBC 20.5* 12.7*  --   CREATININE 4.39* 3.71*  --   LATICACIDVEN  --  4.5* 3.3*    Estimated Creatinine Clearance: 10 mL/min (A) (by C-G formula based on SCr of 3.71 mg/dL (H)).    Allergies  Allergen Reactions  . 5-Alpha Reductase Inhibitors     Antimicrobials this admission: 10/21 Ceftriaxone x1 10/21 Vancomycin >> 10/21 Cefepime >>   Dose adjustments this admission:  Microbiology results: 10/21 UCx: 10/21 COVID: neg; Influenza A/B: neg/neg 10/21 BCx:   Thank you for allowing pharmacy to be a part of this patient's care. Gretta Arab PharmD, BCPS Clinical Pharmacist [facility] [phone] 11/22/2019 9:13 AM

## 2019-11-22 NOTE — ED Notes (Signed)
Admitting provider Marylyn Ishihara made aware of pts Levophed reaching max titration of 10 mcg/min.

## 2019-11-22 NOTE — ED Notes (Signed)
IV site cleaned and redressed due to IV site leakage.

## 2019-11-22 NOTE — ED Notes (Signed)
PCCM Provider [place]son and Admitting Provider Marylyn Ishihara made aware of IV team unsuccessful attempts at a PIV placement.

## 2019-11-22 NOTE — ED Notes (Signed)
Pt placed in trendelenburg to help raise blood pressure.

## 2019-11-22 NOTE — ED Triage Notes (Addendum)
Patient BIB EMS from SNF c/o abdominal pain. EMS stated pt had 1 episode of coffee ground emesis at facility. Pt had 4 mg of Zofran P.O and 136ml of NS IV.

## 2019-11-22 NOTE — ED Notes (Addendum)
Provider at bedside

## 2019-11-22 NOTE — ED Notes (Signed)
Family at bedside. 

## 2019-11-22 NOTE — ED Notes (Signed)
Main phlebotomy notified of need for blood collection.

## 2019-11-22 NOTE — ED Provider Notes (Signed)
[facility] DEPT Provider Note: Ana Spurling, MD, FACEP  CSN: [id number] MRN: [id number] ARRIVAL: 11/22/19 at [name]: Ana Newman  Abdominal Pain  Level 5 caveat: Alzheimer's dementia HISTORY OF PRESENT ILLNESS  11/22/19 12:38 AM [name] is a 84 y.o. female that was brought from her nursing facility for abdominal pain which began the day before yesterday.  The pain is located in the epigastric area.  The patient is unable to relate the quality or severity of the pain but she has been holding her epigastrium with her hand.  EMS reports one episode of vomiting at the facility which was described as "coffee ground".  She was given 4 mg of Zofran orally and 150 mL of normal saline IV prior to arrival.    Past Medical History:  Diagnosis Date  . Acute pyelonephritis   . Alzheimer's disease ([name])    alzheimer's dementia   . Bilateral foot-drop   . Bladder outlet obstruction   . Cholelithiasis   . Constipation   . Depression   . Diabetes mellitus without complication ([name])    type 2   . Diabetic ulcer of right buttock ([name])   . Diarrhea   . Dysrhythmia    ? hx of afib postop 1/31-03/06/18   . Environmental and seasonal allergies   . Essential hypertension, benign   . Foley catheter in place   . GERD (gastroesophageal reflux disease)   . History of kidney stones   . History of urinary tract infection   . Hyperlipidemia   . Hypertension   . Hypothyroidism   . Incontinence    wears diaper   . Insomnia   . Nephrolithiasis   . Osteoarthritis   . Pyelonephritis   . Reflux esophagitis   . Rickets, active   . Sepsis ([place])   . Sepsis ([place])    hx of   . Unspecified constipation   . Unspecified psychosis   . Unspecified vitamin D deficiency   . Vitamin D deficiency     Past Surgical History:  Procedure Laterality Date  . ABDOMINAL HYSTERECTOMY    . CYSTOSCOPY W/ URETERAL STENT PLACEMENT Bilateral 05/18/2015   Procedure: CYSTOSCOPY WITH  RETROGRADE PYELOGRAM/URETERAL STENT PLACEMENT;  Surgeon: Carolan Clines, MD;  Location: [facility];  Service: Urology;  Laterality: Bilateral;  . CYSTOSCOPY W/ URETERAL STENT PLACEMENT Bilateral 03/03/2018   Procedure: CYSTOSCOPY WITH RETROGRADE PYELOGRAM/URETERAL STENT PLACEMENT;  Surgeon: Franchot Gallo, MD;  Location: [facility];  Service: Urology;  Laterality: Bilateral;  . CYSTOSCOPY W/ URETERAL STENT REMOVAL Bilateral 09/12/2015   Procedure: CYSTOSCOPY WITH STENT REMOVAL;  Surgeon: Carolan Clines, MD;  Location: [facility];  Service: Urology;  Laterality: Bilateral;  1 HOUR  . CYSTOSCOPY WITH RETROGRADE PYELOGRAM, URETEROSCOPY AND STENT PLACEMENT Bilateral 07/18/2015   Procedure: CYSTOSCOPY REMOVE LEFT DOUBLE J STENT LEFT RETROGRADE PYELOGRAM, LEFT URETEROSCOPY, PYELOSCOPY, REPLACEMENT OF LEFT DOUBLE J STENT;  Surgeon: Carolan Clines, MD;  Location: [facility];  Service: Urology;  Laterality: Bilateral;  . CYSTOSCOPY WITH RETROGRADE PYELOGRAM, URETEROSCOPY AND STENT PLACEMENT Bilateral 06/08/2018   Procedure: CYSTOSCOPY WITH RETROGRADE PYELOGRAM, URETEROSCOPY AND STENT PLACEMENT;  Surgeon: Franchot Gallo, MD;  Location: [facility];  Service: Urology;  Laterality: Bilateral;  90 MINS  . HOLMIUM LASER APPLICATION Bilateral 5/78/4696   Procedure: HOLMIUM LASER OF LEFT LOWER URETERAL STONE, LEFT PYELOSCOPY, REPLACE LEFT DOUBLE J STENT REMOVE RIGHT DOUBLE J STENT RIGHT RETROGRADE PYELOGRAM, REPLACEMENT RIGHT DOUBLE J STENT ;  Surgeon: Carolan Clines, MD;  Location: [facility];  Service: Urology;  Laterality: Bilateral;  . HOLMIUM LASER APPLICATION Bilateral 03/08/2351   Procedure: HOLMIUM LASER APPLICATION;  Surgeon: Franchot Gallo, MD;  Location: [facility];  Service: Urology;  Laterality: Bilateral;  . KNEE ARTHROSCOPY Left 1997    Family History  Family history unknown: Yes    Social History   Tobacco Use  . Smoking status: Never Smoker  . Smokeless tobacco: Never Used  Substance Use Topics  .  Alcohol use: No  . Drug use: No    Prior to Admission medications   Medication Sig Start Date End Date Taking? Authorizing Provider  acetaminophen (TYLENOL) 500 MG tablet Take 1,000 mg by mouth daily. Pain management   Yes [provider]  aspirin 81 MG tablet Take 81 mg by mouth daily.    Yes [provider]  bisacodyl (DULCOLAX) 10 MG suppository Place 10 mg rectally daily as needed for moderate constipation.    Yes [provider]  Cholecalciferol (VITAMIN D3) 50 MCG (2000 UT) TABS Take 2,000 Units by mouth daily.   Yes [provider]  Cranberry 450 MG CAPS Take 450 mg by mouth daily.   Yes [provider]  docusate sodium (COLACE) 100 MG capsule Take 100 mg by mouth 2 (two) times daily.   Yes [provider]  linaclotide (LINZESS) 145 MCG CAPS capsule Take 145 mcg by mouth daily before breakfast.   Yes [provider]  lisinopril (PRINIVIL,ZESTRIL) 5 MG tablet Take 5 mg by mouth daily.    Yes [provider]  metoprolol tartrate (LOPRESSOR) 25 MG tablet Take 25 mg by mouth 2 (two) times daily. For HTN  Hold if B/P < 100/60 or HR < 60   Yes [provider]  Multiple Vitamin (MULTIVITAMIN WITH MINERALS) TABS tablet Take 1 tablet by mouth daily.   Yes [provider]  Nutritional Supplements (PROMOD) LIQD Take 30 mLs by mouth 2 (two) times daily.   Yes [provider]  ondansetron (ZOFRAN) 4 MG tablet Take 4 mg by mouth every 6 (six) hours as needed for nausea or vomiting.   Yes [provider]  oxyCODONE-acetaminophen (PERCOCET/ROXICET) 5-325 MG tablet Take 1 tablet by mouth every 6 (six) hours as needed for severe pain. 03/06/18  Yes [name], MD  polyethylene glycol (MIRALAX / GLYCOLAX) packet Take 17 g by mouth every 12 (twelve) hours as needed for mild constipation.   Yes [provider]  Polyvinyl Alcohol-Povidone (FRESHKOTE) 2.7-2 % SOLN Apply 1 drop to eye 3 (three)  times daily. To both eyes   Yes [provider]  rosuvastatin (CRESTOR) 10 MG tablet Take 10 mg by mouth daily.   Yes [provider]  senna (SENOKOT) 8.6 MG tablet Take 2 tablets by mouth at bedtime.   Yes [provider]  ciprofloxacin (CIPRO) 500 MG tablet Take 1 tablet (500 mg total) by mouth 2 (two) times daily. Patient not taking: Reported on 11/22/2019 06/08/18   Franchot Gallo, MD  insulin aspart (NOVOLOG) 100 UNIT/ML injection Inject 0-15 Units into the skin 3 (three) times daily with meals. Patient not taking: Reported on 11/22/2019 03/06/18   [name], MD    Allergies 5-alpha reductase inhibitors   REVIEW OF SYSTEMS  Cannot assess: Dementia   PHYSICAL EXAMINATION  Initial Vital Signs Blood pressure (!) 118/105, pulse (!) 117, temperature 98 F (36.7 C), temperature source Oral, resp. rate 16, height 5\' 1"  (1.549 m), weight 63.5 kg, SpO2 100 %.  Examination General:  Well-developed, well-nourished female in no acute distress; appearance consistent with age of record HENT: normocephalic; atraumatic Eyes: pupils equal, round and reactive to light; extraocular muscles grossly intact Neck: supple Heart: regular rate and rhythm; tachycardia; tachycardia Lungs: clear to auscultation bilaterally Abdomen: soft; nondistended; epigastric tenderness; no masses or hepatosplenomegaly; bowel sounds present Rectal: Normal sphincter tone; blackish brown stool in rectal vault; sent for Hemoccult testing Extremities: Contractures of lower extremities Neurologic: Awake, alert, demented; motor function intact in all extremities and symmetric; no facial droop Skin: Warm and dry Psychiatric: Flat affect   RESULTS  Summary of this visit's results, reviewed and interpreted by myself:  EKG Interpretation:  Date & Time: 11/22/2019 12:46 AM  Rate: 108  Rhythm: sinus tachycardia  QRS Axis: left  Intervals: normal  ST/T Wave abnormalities: nonspecific T wave  changes  Conduction Disutrbances:none  Narrative Interpretation:   Old EKG Reviewed: changes noted  Laboratory Studies: Results for orders placed or performed during the hospital encounter of 11/22/19 (from the past 24 hour(s))  POC occult blood, ED     Status: Abnormal   Collection Time: 11/22/19 12:48 AM  Result Value Ref Range   Fecal Occult Bld POSITIVE (A) NEGATIVE  CBC with Differential/Platelet     Status: Abnormal   Collection Time: 11/22/19 12:49 AM  Result Value Ref Range   WBC 20.5 (H) 4.0 - 10.5 K/uL   RBC 3.45 (L) 3.87 - 5.11 MIL/uL   Hemoglobin 11.0 (L) 12.0 - 15.0 g/dL   HCT 35.9 (L) 36 - 46 %   MCV 104.1 (H) 80.0 - 100.0 fL   MCH 31.9 26.0 - 34.0 pg   MCHC 30.6 30.0 - 36.0 g/dL   RDW 13.9 11.5 - 15.5 %   Platelets 443 (H) 150 - 400 K/uL   nRBC 0.5 (H) 0.0 - 0.2 %   Neutrophils Relative % 87 %   Neutro Abs 17.9 (H) 1.7 - 7.7 K/uL   Lymphocytes Relative 5 %   Lymphs Abs 1.0 0.7 - 4.0 K/uL   Monocytes Relative 6 %   Monocytes Absolute 1.2 (H) 0.1 - 1.0 K/uL   Eosinophils Relative 0 %   Eosinophils Absolute 0.0 0.0 - 0.5 K/uL   Basophils Relative 0 %   Basophils Absolute 0.1 0.0 - 0.1 K/uL   Immature Granulocytes 2 %   Abs Immature Granulocytes 0.34 (H) 0.00 - 0.07 K/uL  Basic metabolic panel     Status: Abnormal   Collection Time: 11/22/19 12:49 AM  Result Value Ref Range   Sodium 136 135 - 145 mmol/L   Potassium 6.9 (HH) 3.5 - 5.1 mmol/L   Chloride 101 98 - 111 mmol/L   CO2 14 (L) 22 - 32 mmol/L   Glucose, Bld 144 (H) 70 - 99 mg/dL   BUN 142 (H) 8 - 23 mg/dL   Creatinine, Ser 4.39 (H) 0.44 - 1.00 mg/dL   Calcium 8.6 (L) 8.9 - 10.3 mg/dL   GFR, Estimated 9 (L) >60 mL/min   Anion gap 21 (H) 5 - 15  Lipase, blood     Status: Abnormal   Collection Time: 11/22/19 12:49 AM  Result Value Ref Range   Lipase 63 (H) 11 - 51 U/L  Type and screen     Status: None   Collection Time: 11/22/19 12:50 AM  Result Value Ref Range   ABO/RH(D) B NEG    Antibody  Screen NEG    Sample Expiration      11/25/2019,2359 Performed at [facility]  [facility], [place] [address], [place], [place] [address]   Respiratory Panel by RT PCR (Flu A&B, Covid) - Nasopharyngeal Swab     Status: None   Collection Time: 11/22/19  2:29 AM   Specimen: Nasopharyngeal Swab  Result Value Ref Range   SARS Coronavirus 2 by RT PCR NEGATIVE NEGATIVE   Influenza A by PCR NEGATIVE NEGATIVE   Influenza B by PCR NEGATIVE NEGATIVE  Urinalysis, Routine w reflex microscopic Urine, Catheterized     Status: Abnormal   Collection Time: 11/22/19  3:12 AM  Result Value Ref Range   Color, Urine YELLOW YELLOW   APPearance TURBID (A) CLEAR   Specific Gravity, Urine 1.020 1.005 - 1.030   pH 5.0 5.0 - 8.0   Glucose, UA NEGATIVE NEGATIVE mg/dL   Hgb urine dipstick MODERATE (A) NEGATIVE   Bilirubin Urine NEGATIVE NEGATIVE   Ketones, ur 5 (A) NEGATIVE mg/dL   Protein, ur 100 (A) NEGATIVE mg/dL   Nitrite NEGATIVE NEGATIVE   Leukocytes,Ua SMALL (A) NEGATIVE   RBC / HPF >50 (H) 0 - 5 RBC/hpf   WBC, UA >50 (H) 0 - 5 WBC/hpf   Bacteria, UA MANY (A) NONE SEEN   Squamous Epithelial / LPF >50 (H) 0 - 5   WBC Clumps PRESENT   Lactic acid, plasma     Status: Abnormal   Collection Time: 11/22/19  3:13 AM  Result Value Ref Range   Lactic Acid, Venous 4.5 (HH) 0.5 - 1.9 mmol/L  CBC     Status: Abnormal   Collection Time: 11/22/19  3:13 AM  Result Value Ref Range   WBC 12.7 (H) 4.0 - 10.5 K/uL   RBC 3.05 (L) 3.87 - 5.11 MIL/uL   Hemoglobin 9.5 (L) 12.0 - 15.0 g/dL   HCT 31.4 (L) 36 - 46 %   MCV 103.0 (H) 80.0 - 100.0 fL   MCH 31.1 26.0 - 34.0 pg   MCHC 30.3 30.0 - 36.0 g/dL   RDW 13.9 11.5 - 15.5 %   Platelets 310 150 - 400 K/uL   nRBC 0.2 0.0 - 0.2 %  Basic metabolic panel     Status: Abnormal   Collection Time: 11/22/19  3:13 AM  Result Value Ref Range   Sodium 137 135 - 145 mmol/L   Potassium 5.8 (H) 3.5 - 5.1 mmol/L   Chloride 105 98 - 111 mmol/L   CO2 14 (L) 22 - 32 mmol/L     Glucose, Bld 225 (H) 70 - 99 mg/dL   BUN 134 (H) 8 - 23 mg/dL   Creatinine, Ser 3.71 (H) 0.44 - 1.00 mg/dL   Calcium 7.7 (L) 8.9 - 10.3 mg/dL   GFR, Estimated 11 (L) >60 mL/min   Anion gap 18 (H) 5 - 15  Lactic acid, plasma     Status: Abnormal   Collection Time: 11/22/19  5:25 AM  Result Value Ref Range   Lactic Acid, Venous 3.3 (HH) 0.5 - 1.9 mmol/L   Imaging Studies: No results found.  ED COURSE and MDM  Nursing notes, initial and subsequent vitals signs, including pulse oximetry, reviewed and interpreted by myself.  Vitals:   11/22/19 0504 11/22/19 0524 11/22/19 0605 11/22/19 0608  BP: 97/61 (!) 92/53 (!) 71/44 (!) 86/76  Pulse:  (!) 104 98 (!) 101  Resp: (!) 25 (!) 23 18 (!) 21  Temp:      TempSrc:      SpO2:  95% 100% 100%  Weight:  Height:       Medications  sodium zirconium cyclosilicate (LOKELMA) packet 5 g (5 g Oral Refused 11/22/19 0332)  0.9 %  sodium chloride infusion ( Intravenous New Bag/Given 11/22/19 0610)  pantoprazole (PROTONIX) 80 mg in sodium chloride 0.9 % 100 mL (0.8 mg/mL) infusion (8 mg/hr Intravenous New Bag/Given 11/22/19 0615)  pantoprazole (PROTONIX) injection 40 mg (has no administration in time range)  0.9 %  sodium chloride infusion ( Intravenous Stopped 11/22/19 0218)  pantoprazole (PROTONIX) injection 40 mg (40 mg Intravenous Given 11/22/19 0116)  ondansetron (ZOFRAN) injection 4 mg (4 mg Intravenous Given 11/22/19 0116)  lactated ringers bolus 1,000 mL (0 mLs Intravenous Stopped 11/22/19 0250)  calcium gluconate 1 g/ 50 mL sodium chloride IVPB (0 g Intravenous Stopped 11/22/19 0350)  sodium bicarbonate injection 50 mEq (50 mEq Intravenous Given 11/22/19 0236)  dextrose 50 % solution 50 mL (50 mLs Intravenous Given 11/22/19 0236)  insulin aspart (novoLOG) injection 10 Units (10 Units Intravenous Given 11/22/19 0249)  sodium chloride 0.9 % bolus 1,000 mL (0 mLs Intravenous Stopped 11/22/19 0350)  sodium chloride 0.9 % bolus 1,000 mL (0  mLs Intravenous Stopped 11/22/19 0534)  cefTRIAXone (ROCEPHIN) 1 g in sodium chloride 0.9 % 100 mL IVPB (0 g Intravenous Stopped 11/22/19 0616)  pantoprazole (PROTONIX) 80 mg in sodium chloride 0.9 % 100 mL IVPB (80 mg Intravenous New Bag/Given 11/22/19 0617)  vancomycin (VANCOCIN) IVPB 1000 mg/200 mL premix (1,000 mg Intravenous New Bag/Given 11/22/19 0608)   2:28 AM Patient is in acute renal failure with hyperkalemia.  Calcium gluconate, sodium bicarb, D50 and insulin IV ordered.  Lokelma ordered orally.  Will have patient admitted.  She has some element of a GI bleed but her hemoglobin is 11 so I do not believe transfusion is indicated at this time but she is getting normal saline bolus (LR switched to normal saline after labs showed hyperkalemia) for hypotension in the 73U systolic.Marland Kitchen  She was given Protonix IV for possible gastric bleed.  2:42 AM Discussed with Dr. Hal Hope.  We will administer 2 L of normal saline and reassess her blood pressure.   3:12 AM Urine grossly purulent.  Urine sent for urinalysis and culture.  Blood cultures and lactate ordered.  5:05 AM Rocephin 1 g ordered for urinary tract infection.   5:54 AM Suspect patient is septic due to urinary tract infection.  Discussed with Dr. Hal Hope who requests that we add vancomycin.  Blood cultures and urinary culture pending.  6:20 AM Lactic acid has improved after 2 L normal saline.  We will hold off on additional fluid given patient's acute renal failure.  Her hemoglobin is dropped to 9.5 with hydration.   PROCEDURES  Procedures CRITICAL CARE Performed by: Karen Chafe Jasmon Mattice Total critical care time: 45 minutes Critical care time was exclusive of separately billable procedures and treating other patients. Critical care was necessary to treat or prevent imminent or life-threatening deterioration. Critical care was time spent personally by me on the following activities: development of treatment plan with patient  and/or surrogate as well as nursing, discussions with consultants, evaluation of patient's response to treatment, examination of patient, obtaining history from patient or surrogate, ordering and performing treatments and interventions, ordering and review of laboratory studies, ordering and review of radiographic studies, pulse oximetry and re-evaluation of patient's condition.   ED DIAGNOSES     ICD-10-CM   1. Sepsis due to urinary tract infection (HCC)  A41.9    N39.0   2. Acute renal  failure, unspecified acute renal failure type ([place])  N17.9   3. Hyperkalemia  E87.5   4. Epigastric pain  R10.13   5. Upper GI bleed  K92.2   6. Hypotension due to blood loss  I95.89    R58        Leven Hoel, Jenny Reichmann, MD 11/22/19 [id number]

## 2019-11-23 DIAGNOSIS — R1084 Generalized abdominal pain: Secondary | ICD-10-CM

## 2019-11-23 DIAGNOSIS — A419 Sepsis, unspecified organism: Secondary | ICD-10-CM | POA: Diagnosis not present

## 2019-11-23 DIAGNOSIS — N179 Acute kidney failure, unspecified: Secondary | ICD-10-CM | POA: Diagnosis not present

## 2019-11-23 DIAGNOSIS — N390 Urinary tract infection, site not specified: Secondary | ICD-10-CM

## 2019-11-23 DIAGNOSIS — L8990 Pressure ulcer of unspecified site, unspecified stage: Secondary | ICD-10-CM | POA: Insufficient documentation

## 2019-11-23 LAB — CBC
HCT: 23.7 % — ABNORMAL LOW (ref 36.0–46.0)
Hemoglobin: 7.4 g/dL — ABNORMAL LOW (ref 12.0–15.0)
MCH: 30.8 pg (ref 26.0–34.0)
MCHC: 31.2 g/dL (ref 30.0–36.0)
MCV: 98.8 fL (ref 80.0–100.0)
Platelets: 259 10*3/uL (ref 150–400)
RBC: 2.4 MIL/uL — ABNORMAL LOW (ref 3.87–5.11)
RDW: 14.2 % (ref 11.5–15.5)
WBC: 14 10*3/uL — ABNORMAL HIGH (ref 4.0–10.5)
nRBC: 0.2 % (ref 0.0–0.2)

## 2019-11-23 LAB — COMPREHENSIVE METABOLIC PANEL
ALT: 262 U/L — ABNORMAL HIGH (ref 0–44)
ALT: 244 U/L — ABNORMAL HIGH (ref 0–44)
AST: 181 U/L — ABNORMAL HIGH (ref 15–41)
AST: 149 U/L — ABNORMAL HIGH (ref 15–41)
Albumin: 2.1 g/dL — ABNORMAL LOW (ref 3.5–5.0)
Albumin: 2.1 g/dL — ABNORMAL LOW (ref 3.5–5.0)
Alkaline Phosphatase: 51 U/L (ref 38–126)
Alkaline Phosphatase: 52 U/L (ref 38–126)
Anion gap: 18 — ABNORMAL HIGH (ref 5–15)
Anion gap: 16 — ABNORMAL HIGH (ref 5–15)
BUN: 96 mg/dL — ABNORMAL HIGH (ref 8–23)
BUN: 97 mg/dL — ABNORMAL HIGH (ref 8–23)
CO2: 15 mmol/L — ABNORMAL LOW (ref 22–32)
CO2: 16 mmol/L — ABNORMAL LOW (ref 22–32)
Calcium: 7.5 mg/dL — ABNORMAL LOW (ref 8.9–10.3)
Calcium: 7.2 mg/dL — ABNORMAL LOW (ref 8.9–10.3)
Chloride: 114 mmol/L — ABNORMAL HIGH (ref 98–111)
Chloride: 111 mmol/L (ref 98–111)
Creatinine, Ser: 2.66 mg/dL — ABNORMAL HIGH (ref 0.44–1.00)
Creatinine, Ser: 2.56 mg/dL — ABNORMAL HIGH (ref 0.44–1.00)
GFR, Estimated: 17 mL/min — ABNORMAL LOW (ref >60)
GFR, Estimated: 18 mL/min — ABNORMAL LOW (ref >60)
Glucose, Bld: 97 mg/dL (ref 70–99)
Glucose, Bld: 98 mg/dL (ref 70–99)
Potassium: 4.6 mmol/L (ref 3.5–5.1)
Potassium: 4.2 mmol/L (ref 3.5–5.1)
Sodium: 147 mmol/L — ABNORMAL HIGH (ref 135–145)
Sodium: 143 mmol/L (ref 135–145)
Total Bilirubin: 0.9 mg/dL (ref 0.3–1.2)
Total Bilirubin: 1.2 mg/dL (ref 0.3–1.2)
Total Protein: 5.3 g/dL — ABNORMAL LOW (ref 6.5–8.1)
Total Protein: 5.1 g/dL — ABNORMAL LOW (ref 6.5–8.1)

## 2019-11-23 LAB — GLUCOSE, CAPILLARY
Glucose-Capillary: 123 mg/dL — ABNORMAL HIGH (ref 70–99)
Glucose-Capillary: 112 mg/dL — ABNORMAL HIGH (ref 70–99)
Glucose-Capillary: 131 mg/dL — ABNORMAL HIGH (ref 70–99)
Glucose-Capillary: 157 mg/dL — ABNORMAL HIGH (ref 70–99)

## 2019-11-23 LAB — CBG MONITORING, ED
Glucose-Capillary: 181 mg/dL — ABNORMAL HIGH (ref 70–99)
Glucose-Capillary: 48 mg/dL — ABNORMAL LOW (ref 70–99)
Glucose-Capillary: 186 mg/dL — ABNORMAL HIGH (ref 70–99)
Glucose-Capillary: 153 mg/dL — ABNORMAL HIGH (ref 70–99)
Glucose-Capillary: 141 mg/dL — ABNORMAL HIGH (ref 70–99)

## 2019-11-23 LAB — URINE CULTURE

## 2019-11-23 LAB — RENAL FUNCTION PANEL
Albumin: 2.2 g/dL — ABNORMAL LOW (ref 3.5–5.0)
Anion gap: 16 — ABNORMAL HIGH (ref 5–15)
BUN: 96 mg/dL — ABNORMAL HIGH (ref 8–23)
CO2: 17 mmol/L — ABNORMAL LOW (ref 22–32)
Calcium: 7.2 mg/dL — ABNORMAL LOW (ref 8.9–10.3)
Chloride: 111 mmol/L (ref 98–111)
Creatinine, Ser: 2.56 mg/dL — ABNORMAL HIGH (ref 0.44–1.00)
GFR, Estimated: 18 mL/min — ABNORMAL LOW (ref >60)
Glucose, Bld: 99 mg/dL (ref 70–99)
Phosphorus: 4 mg/dL (ref 2.5–4.6)
Potassium: 4.2 mmol/L (ref 3.5–5.1)
Sodium: 144 mmol/L (ref 135–145)

## 2019-11-23 LAB — PROTIME-INR
INR: 1.5 — ABNORMAL HIGH (ref 0.8–1.2)
Prothrombin Time: 17.3 seconds — ABNORMAL HIGH (ref 11.4–15.2)

## 2019-11-23 LAB — PROCALCITONIN: Procalcitonin: 7.32 ng/mL

## 2019-11-23 LAB — MRSA PCR SCREENING: MRSA by PCR: NEGATIVE

## 2019-11-23 LAB — CORTISOL-AM, BLOOD: Cortisol - AM: 23.5 ug/dL — ABNORMAL HIGH (ref 6.7–22.6)

## 2019-11-23 MED ORDER — ORAL CARE MOUTH RINSE
15.0000 mL | Freq: Two times a day (BID) | OROMUCOSAL | Status: DC
  Administered 2019-11-23 – 2019-11-30 (×13): 15 mL via OROMUCOSAL

## 2019-11-23 MED ORDER — DEXTROSE-NACL 5-0.2 % IV SOLN: INTRAVENOUS | Status: DC

## 2019-11-23 MED ORDER — DEXTROSE 50 % IV SOLN
1.0000 | Freq: Once | INTRAVENOUS | Status: AC
  Administered 2019-11-23: 50 mL via INTRAVENOUS
  Filled 2019-11-23: qty 50

## 2019-11-23 MED ORDER — CHLORHEXIDINE GLUCONATE CLOTH 2 % EX PADS
6.0000 | MEDICATED_PAD | Freq: Every day | CUTANEOUS | Status: DC
  Administered 2019-11-23 – 2019-12-01 (×9): 6 via TOPICAL

## 2019-11-23 MED ORDER — NOREPINEPHRINE 4 MG/250ML-% IV SOLN
0.0000 ug/min | INTRAVENOUS | Status: DC
  Administered 2019-11-23: 11 ug/min via INTRAVENOUS
  Administered 2019-11-23: 9 ug/min via INTRAVENOUS
  Administered 2019-11-24: 2 ug/min via INTRAVENOUS
  Administered 2019-11-24: 8 ug/min via INTRAVENOUS
  Filled 2019-11-23 (×3): qty 250

## 2019-11-23 MED ORDER — LACTATED RINGERS IV BOLUS
1000.0000 mL | Freq: Once | INTRAVENOUS | Status: AC
  Administered 2019-11-23: 1000 mL via INTRAVENOUS

## 2019-11-23 NOTE — Consult Note (Signed)
WOC Nurse Consult Note: Reason for Consult: stage 2 pressure injury with Moisture associated skin damage to sacrococcygeal area  Present on admission.  Wound type:pressure and moisture Pressure Injury POA: Yes Measurement: 4 cmx 3.5 cm x 0.2 cm with scattered intact skin between 3 open areas.  Wound KVQ:OHCO pink and moist Drainage (amount, consistency, odor) scant weeping. Periwound:frequently moist  Wears brief at nursing home. Contractures present to legs, difficult to reposition.  Dressing procedure/placement/frequency:Cleanse sacral wound with soap and water. Apply barrier cream.  Avoid use of disposable briefs or underpads.   Will not follow at this time.  Please re-consult if needed.  Domenic Moras MSN, RN, FNP-BC CWON Wound, Ostomy, Continence Nurse Pager [phone]

## 2019-11-23 NOTE — Plan of Care (Signed)
Discussed plan of care with patient, pain management and bedtime medications she refused to take any pills at this time but did allow mouth care and eye drops.  Patient had no evidence of learning at this time.

## 2019-11-23 NOTE — Progress Notes (Signed)
The patient seems to be stable from the GI bleeding standpoint.  Per conversation with the patient's nurse, she has not had either melena or coffee-ground emesis.  In fact, the patient had a greenish-brown stool earlier today.  Unfortunately, the patient did not get a CBC drawn this morning so I have ordered 1 for now and the result is pending.  The patient is still requiring low doses of pressors (9 mcg of Levophed).  However, she looks much more alert today and is actually talkative.  However, I do not think she is completely "with it."  Abdominal exam shows normal bowel sounds but any touching of her abdomen, even light contact with the stethoscope, causes her to withdraw and say "Stop that."  I do not think to abdominal tenderness is present.  It is recalled that yesterday CT showed a dilated gallbladder with probable sludge, but no acute abnormalities to account for abdominal pain.  Impression:  1.  Coffee-ground emesis, without evidence of further GI bleeding on PPI medication.  Follow-up CBC pending.  2.  Acute renal failure  3.  Dementia, immobility status post injury many years ago  4.  Sepsis-like picture, but with urine culture just showing multiple species and blood culture negative so far.    Discussion: The way this is playing out, I think she probably had stress gastritis rather than GI bleeding being the primary problem to account for her presenting, and ongoing, hypotension.   Plan:  1.  Continue PPI therapy.  Might decrease to once daily dosing if patient remains stable, in view of renal failure  2.  Follow CBC.  Given patient's multiple medical issues, I would lean away from endoscopic evaluation, and instead would favor medical therapy, particularly if her hemoglobin plateaus and she shows no further evidence of clinical bleeding.  3.  Will advance diet at this time.  So as to control potassium intake, I will use a renal diet rather than a clear liquid diet.  Cleotis Nipper, M.D. Pager [phone] If no answer or after 5 PM call [phone]
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
## 2019-11-23 NOTE — ED Notes (Signed)
Report called to [facility]. Pt to ICU via stretcher.

## 2019-11-23 NOTE — Consult Note (Addendum)
NAME:  Ana Newman, MRN:  [id number], DOB:  07-01-1935, LOS: 1 ADMISSION DATE:  11/22/2019, CONSULTATION DATE:  11/23/19 REFERRING MD:  Lanier Clam, MDCHIEF COMPLAINT:  Abdominal pain (wailing in room)   Brief History   84 year old with recurrent UTI admitted with abdominal pain and hypotension with concern for septic shock and possible UGIB given concern for hematemesis whom we are consulted for evaluation of shock.  History of present illness   History per EMR. Patient in pain, unable to provide history, moaning in pain  Past Medical History  HTN, HLD  Significant Hospital Events   Admitted 10/21  Consults:  PCCM, GI  Procedures:  N/a, potential CVC  Significant Diagnostic Tests:  CT renal protocol: bilateral stone w/o hydro  Micro Data:  pending  Antimicrobials:  Vanc10/21 >>10/22 Cefepime 10/21 >> Interim history/subjective:  As above  Objective   Blood pressure 93/64, pulse 97, temperature 99.3 F (37.4 C), temperature source Oral, resp. rate (!) 27, height 5\' 1"  (1.549 m), weight 63.5 kg, SpO2 100 %.        Intake/Output Summary (Last 24 hours) at 11/23/2019 1704 Last data filed at 11/23/2019 1336 Gross per 24 hour  Intake 4358.58 ml  Output --  Net 4358.58 ml   Filed Weights   11/22/19 0039  Weight: 63.5 kg    Examination: General: frail, elderly, moaning in pain Abd: diffuse tenderness, guard, no rebound Resp: CTAB, NWOB CV: Tachy, regular rhythm  Resolved Hospital Problem list   n/a  Assessment & Plan:  Septic shock: Creatinine elevated, lactic acid elevated, presumed urinary source with dirty UA.  --MAP > 65, NE ordered --1L NS this PM --Abx - cefepime (Urine Cx multiple species), d/c vanc (MRSA screen negative)  Abdominal Pain: Concern for possible renal cholic/pyelo given likey UTI. Report of hematemesis raises concern for GI ulcer. Most likely constipation ,exam non-focal.  --See below --PRN pain  meds  Hematemesis/black emesis: Hgb drop but in setting of aggressive IV hydration, WBC and plts down slightly too which could suggest hemodilution. If bleed not present, worry about constipation and feculent emesis based on appearance. --IV protonix per GI  --Trend Hgb  Anemia: Hgb trensing down. Treating medically for possible UGIB. Element of dilution with IVFs.  AKI: ATN in setting os hypotension, shock. --Cr mildly improving with fluids, pressors  84 year old frail female with multi-organ failure. DNR appropriate, prognosis is grave.   Best practice:  Per primary  Labs   CBC: Recent Labs  Lab 11/22/19 0049 11/22/19 0313 11/22/19 1150 11/23/19 1414  WBC 20.5* 12.7* 12.2* 14.0*  NEUTROABS 17.9*  --   --   --   HGB 11.0* 9.5* 8.7* 7.4*  HCT 35.9* 31.4* 27.1* 23.7*  MCV 104.1* 103.0* 99.3 98.8  PLT 443* 310 291 381    Basic Metabolic Panel: Recent Labs  Lab 11/22/19 0049 11/22/19 0313 11/22/19 2323 11/23/19 0418  NA 136 137 147* 143  144  K 6.9* 5.8* 4.6 4.2  4.2  CL 101 105 114* 111  111  CO2 14* 14* 15* 16*  17*  GLUCOSE 144* 225* 97 98  99  BUN 142* 134* 96* 97*  96*  CREATININE 4.39* 3.71* 2.66* 2.56*  2.56*  CALCIUM 8.6* 7.7* 7.5* 7.2*  7.2*  PHOS  --   --   --  4.0   GFR: Estimated Creatinine Clearance: 14.5 mL/min (A) (by C-G formula based on SCr of 2.56 mg/dL (H)). Recent Labs  Lab  11/22/19 0049 11/22/19 0313 11/22/19 0525 11/22/19 0910 11/22/19 1150 11/23/19 0418 11/23/19 1414  PROCALCITON  --   --   --   --   --  7.32  --   WBC 20.5* 12.7*  --   --  12.2*  --  14.0*  LATICACIDVEN  --  4.5* 3.3* 1.4  --   --   --     Liver Function Tests: Recent Labs  Lab 11/22/19 2323 11/23/19 0418  AST 181* 149*  ALT 262* 244*  ALKPHOS 51 52  BILITOT 0.9 1.2  PROT 5.3* 5.1*  ALBUMIN 2.1* 2.1*  2.2*   Recent Labs  Lab 11/22/19 0049  LIPASE 63*   No results for input(s): AMMONIA in the last 168 hours.  ABG No results found for:  PHART, PCO2ART, PO2ART, HCO3, TCO2, ACIDBASEDEF, O2SAT   Coagulation Profile: Recent Labs  Lab 11/23/19 0418  INR 1.5*    Cardiac Enzymes: No results for input(s): CKTOTAL, CKMB, CKMBINDEX, TROPONINI in the last 168 hours.  HbA1C: Hemoglobin A1C  Date/Time Value Ref Range Status  08/24/2017 12:00 AM 6.9  Final  06/17/2017 12:00 AM 6.0  Final    CBG: Recent Labs  Lab 11/23/19 0733 11/23/19 0847 11/23/19 0944 11/23/19 1119 11/23/19 1623  GLUCAP 153* 141* 123* 112* 131*    Review of Systems:   Unable to obtain  Past Medical History  She,  has a past medical history of Acute pyelonephritis, Alzheimer's disease ([place]), Bilateral foot-drop, Bladder outlet obstruction, Cholelithiasis, Constipation, Depression, Diabetes mellitus without complication ([place]), Diabetic ulcer of right buttock ([place]), Diarrhea, Dysrhythmia, Environmental and seasonal allergies, Essential hypertension, benign, Foley catheter in place, GERD (gastroesophageal reflux disease), History of kidney stones, History of urinary tract infection, Hyperlipidemia, Hypertension, Hypothyroidism, Incontinence, Insomnia, Nephrolithiasis, Osteoarthritis, Pyelonephritis, Reflux esophagitis, Rickets, active, Sepsis ([place]), Sepsis ([place]), Unspecified constipation, Unspecified psychosis, Unspecified vitamin D deficiency, and Vitamin D deficiency.   Surgical History    Past Surgical History:  Procedure Laterality Date  . ABDOMINAL HYSTERECTOMY    . CYSTOSCOPY W/ URETERAL STENT PLACEMENT Bilateral 05/18/2015   Procedure: CYSTOSCOPY WITH RETROGRADE PYELOGRAM/URETERAL STENT PLACEMENT;  Surgeon: Carolan Clines, MD;  Location: [facility];  Service: Urology;  Laterality: Bilateral;  . CYSTOSCOPY W/ URETERAL STENT PLACEMENT Bilateral 03/03/2018   Procedure: CYSTOSCOPY WITH RETROGRADE PYELOGRAM/URETERAL STENT PLACEMENT;  Surgeon: Franchot Gallo, MD;  Location: [facility];  Service: Urology;  Laterality: Bilateral;  . CYSTOSCOPY W/ URETERAL  STENT REMOVAL Bilateral 09/12/2015   Procedure: CYSTOSCOPY WITH STENT REMOVAL;  Surgeon: Carolan Clines, MD;  Location: [facility];  Service: Urology;  Laterality: Bilateral;  1 HOUR  . CYSTOSCOPY WITH RETROGRADE PYELOGRAM, URETEROSCOPY AND STENT PLACEMENT Bilateral 07/18/2015   Procedure: CYSTOSCOPY REMOVE LEFT DOUBLE J STENT LEFT RETROGRADE PYELOGRAM, LEFT URETEROSCOPY, PYELOSCOPY, REPLACEMENT OF LEFT DOUBLE J STENT;  Surgeon: Carolan Clines, MD;  Location: [facility];  Service: Urology;  Laterality: Bilateral;  . CYSTOSCOPY WITH RETROGRADE PYELOGRAM, URETEROSCOPY AND STENT PLACEMENT Bilateral 06/08/2018   Procedure: CYSTOSCOPY WITH RETROGRADE PYELOGRAM, URETEROSCOPY AND STENT PLACEMENT;  Surgeon: Franchot Gallo, MD;  Location: [facility];  Service: Urology;  Laterality: Bilateral;  90 MINS  . HOLMIUM LASER APPLICATION Bilateral 5/39/7673   Procedure: HOLMIUM LASER OF LEFT LOWER URETERAL STONE, LEFT PYELOSCOPY, REPLACE LEFT DOUBLE J STENT REMOVE RIGHT DOUBLE J STENT RIGHT RETROGRADE PYELOGRAM, REPLACEMENT RIGHT DOUBLE J STENT ;  Surgeon: Carolan Clines, MD;  Location: [facility];  Service: Urology;  Laterality: Bilateral;  . HOLMIUM LASER APPLICATION Bilateral 05/02/9377   Procedure: HOLMIUM  LASER APPLICATION;  Surgeon: Franchot Gallo, MD;  Location: [facility];  Service: Urology;  Laterality: Bilateral;  . KNEE ARTHROSCOPY Left 1997     Social History   reports that she has never smoked. She has never used smokeless tobacco. She reports that she does not drink alcohol and does not use drugs.   Family History   Her Family history is unknown by patient.   Allergies Allergies  Allergen Reactions  . 5-Alpha Reductase Inhibitors      Home Medications  Prior to Admission medications   Medication Sig Start Date End Date Taking? Authorizing Provider  acetaminophen (TYLENOL) 500 MG tablet Take 1,000 mg by mouth daily. Pain management   Yes [provider]  aspirin 81 MG tablet Take 81 mg by  mouth daily.    Yes [provider]  bisacodyl (DULCOLAX) 10 MG suppository Place 10 mg rectally daily as needed for moderate constipation.    Yes [provider]  Cholecalciferol (VITAMIN D3) 50 MCG (2000 UT) TABS Take 2,000 Units by mouth daily.   Yes [provider]  Cranberry 450 MG CAPS Take 450 mg by mouth daily.   Yes [provider]  docusate sodium (COLACE) 100 MG capsule Take 100 mg by mouth 2 (two) times daily.   Yes [provider]  linaclotide (LINZESS) 145 MCG CAPS capsule Take 145 mcg by mouth daily before breakfast.   Yes [provider]  lisinopril (PRINIVIL,ZESTRIL) 5 MG tablet Take 5 mg by mouth daily.    Yes [provider]  metoprolol tartrate (LOPRESSOR) 25 MG tablet Take 25 mg by mouth 2 (two) times daily. For HTN  Hold if B/P < 100/60 or HR < 60   Yes [provider]  Multiple Vitamin (MULTIVITAMIN WITH MINERALS) TABS tablet Take 1 tablet by mouth daily.   Yes [provider]  Nutritional Supplements (PROMOD) LIQD Take 30 mLs by mouth 2 (two) times daily.   Yes [provider]  ondansetron (ZOFRAN) 4 MG tablet Take 4 mg by mouth every 6 (six) hours as needed for nausea or vomiting.   Yes [provider]  oxyCODONE-acetaminophen (PERCOCET/ROXICET) 5-325 MG tablet Take 1 tablet by mouth every 6 (six) hours as needed for severe pain. 03/06/18  Yes [name], MD  polyethylene glycol (MIRALAX / GLYCOLAX) packet Take 17 g by mouth every 12 (twelve) hours as needed for mild constipation.   Yes [provider]  Polyvinyl Alcohol-Povidone (FRESHKOTE) 2.7-2 % SOLN Apply 1 drop to eye 3 (three) times daily. To both eyes   Yes [provider]  rosuvastatin (CRESTOR) 10 MG tablet Take 10 mg by mouth daily.   Yes [provider]  senna (SENOKOT) 8.6 MG tablet Take 2 tablets by mouth at bedtime.   Yes [provider]  ciprofloxacin (CIPRO) 500 MG tablet  Take 1 tablet (500 mg total) by mouth 2 (two) times daily. Patient not taking: Reported on 11/22/2019 06/08/18   Franchot Gallo, MD  insulin aspart (NOVOLOG) 100 UNIT/ML injection Inject 0-15 Units into the skin 3 (three) times daily with meals. Patient not taking: Reported on 11/22/2019 03/06/18   [name], MD     Critical care time:     CRITICAL CARE Performed by: Lanier Clam   Total critical care time: 32 minutes  Critical care time was exclusive of separately billable procedures and treating other patients.  Critical care was necessary to treat or prevent imminent or life-threatening deterioration.  Critical care  was time spent personally by me on the following activities: development of treatment plan with patient and/or surrogate as well as nursing, discussions with consultants, evaluation of patient's response to treatment, examination of patient, obtaining history from patient or surrogate, ordering and performing treatments and interventions, ordering and review of laboratory studies, ordering and review of radiographic studies, pulse oximetry and re-evaluation of patient's condition.

## 2019-11-24 DIAGNOSIS — A419 Sepsis, unspecified organism: Secondary | ICD-10-CM | POA: Diagnosis not present

## 2019-11-24 DIAGNOSIS — N390 Urinary tract infection, site not specified: Secondary | ICD-10-CM | POA: Diagnosis not present

## 2019-11-24 LAB — CBC
HCT: 22.9 % — ABNORMAL LOW (ref 36.0–46.0)
Hemoglobin: 7.4 g/dL — ABNORMAL LOW (ref 12.0–15.0)
MCH: 31.9 pg (ref 26.0–34.0)
MCHC: 32.3 g/dL (ref 30.0–36.0)
MCV: 98.7 fL (ref 80.0–100.0)
Platelets: 227 10*3/uL (ref 150–400)
RBC: 2.32 MIL/uL — ABNORMAL LOW (ref 3.87–5.11)
RDW: 14.3 % (ref 11.5–15.5)
WBC: 12.3 10*3/uL — ABNORMAL HIGH (ref 4.0–10.5)
nRBC: 0.2 % (ref 0.0–0.2)

## 2019-11-24 LAB — HEMOGLOBIN A1C
Hgb A1c MFr Bld: 5.5 % (ref 4.8–5.6)
Mean Plasma Glucose: 111 mg/dL

## 2019-11-24 LAB — GLUCOSE, CAPILLARY
Glucose-Capillary: 168 mg/dL — ABNORMAL HIGH (ref 70–99)
Glucose-Capillary: 167 mg/dL — ABNORMAL HIGH (ref 70–99)
Glucose-Capillary: 80 mg/dL (ref 70–99)
Glucose-Capillary: 98 mg/dL (ref 70–99)

## 2019-11-24 LAB — RENAL FUNCTION PANEL
Albumin: 2 g/dL — ABNORMAL LOW (ref 3.5–5.0)
Anion gap: 12 (ref 5–15)
BUN: 60 mg/dL — ABNORMAL HIGH (ref 8–23)
CO2: 26 mmol/L (ref 22–32)
Calcium: 7.1 mg/dL — ABNORMAL LOW (ref 8.9–10.3)
Chloride: 104 mmol/L (ref 98–111)
Creatinine, Ser: 1.53 mg/dL — ABNORMAL HIGH (ref 0.44–1.00)
GFR, Estimated: 34 mL/min — ABNORMAL LOW (ref >60)
Glucose, Bld: 198 mg/dL — ABNORMAL HIGH (ref 70–99)
Phosphorus: 1.8 mg/dL — ABNORMAL LOW (ref 2.5–4.6)
Potassium: 3.3 mmol/L — ABNORMAL LOW (ref 3.5–5.1)
Sodium: 142 mmol/L (ref 135–145)

## 2019-11-24 MED ORDER — SODIUM CHLORIDE 0.9 % IV SOLN: Freq: Once | INTRAVENOUS | Status: AC

## 2019-11-24 MED ORDER — POTASSIUM CHLORIDE 10 MEQ/100ML IV SOLN
10.0000 meq | INTRAVENOUS | Status: AC
  Administered 2019-11-24 (×2): 10 meq via INTRAVENOUS
  Filled 2019-11-24: qty 100

## 2019-11-24 MED ORDER — PANTOPRAZOLE SODIUM 40 MG IV SOLR
40.0000 mg | Freq: Every day | INTRAVENOUS | Status: DC
  Administered 2019-11-24: 40 mg via INTRAVENOUS
  Filled 2019-11-24: qty 40

## 2019-11-24 NOTE — Progress Notes (Signed)
Subjective: NO reported bleeding.  Objective: Vital signs in last 24 hours: Temp:  [98.1 F (36.7 C)-99.1 F (37.3 C)] 98.3 F (36.8 C) (10/23 1100) Pulse Rate:  [62-113] 68 (10/23 1400) Resp:  [16-35] 23 (10/23 1400) BP: (93-159)/(27-97) 104/30 (10/23 1400) SpO2:  [91 %-100 %] 94 % (10/23 1400) Weight change:  Last BM Date: 11/23/19  PE: GEN:  NAD, demented ABD:  Patient deferred  Lab Results: CBC    Component Value Date/Time   WBC 12.3 (H) 11/24/2019 0310   RBC 2.32 (L) 11/24/2019 0310   HGB 7.4 (L) 11/24/2019 0310   HCT 22.9 (L) 11/24/2019 0310   PLT 227 11/24/2019 0310   MCV 98.7 11/24/2019 0310   MCH 31.9 11/24/2019 0310   MCHC 32.3 11/24/2019 0310   RDW 14.3 11/24/2019 0310   LYMPHSABS 1.0 11/22/2019 0049   MONOABS 1.2 (H) 11/22/2019 0049   EOSABS 0.0 11/22/2019 0049   BASOSABS 0.1 11/22/2019 0049   CMP     Component Value Date/Time   NA 142 11/24/2019 0310   NA 138 08/24/2017 0000   K 3.3 (L) 11/24/2019 0310   CL 104 11/24/2019 0310   CO2 26 11/24/2019 0310   GLUCOSE 198 (H) 11/24/2019 0310   BUN 60 (H) 11/24/2019 0310   BUN 18 08/24/2017 0000   CREATININE 1.53 (H) 11/24/2019 0310   CALCIUM 7.1 (L) 11/24/2019 0310   PROT 5.1 (L) 11/23/2019 0418   ALBUMIN 2.0 (L) 11/24/2019 0310   AST 149 (H) 11/23/2019 0418   ALT 244 (H) 11/23/2019 0418   ALKPHOS 52 11/23/2019 0418   BILITOT 1.2 11/23/2019 0418   GFRNONAA 34 (L) 11/24/2019 0310   GFRAA 52 (L) 06/08/2018 1129   Assessment:  1.  Coffee ground emesis.  ? Melena.  Resolved. 2.  Anemia, acute on chronic. 3.  Dementia. 4.  Acute renal failure.  Plan:  1.  PPI, ok to transition to PO. 2.  No plans for endoscopy. 3.  [facility] will revisit Monday.   Ana Newman 11/24/2019, 4:56 PM   Cell [phone] If no answer or after 5 PM call [phone]

## 2019-11-24 NOTE — Progress Notes (Signed)
Patient did refuse care of and on throughout the shift. She didn't allow turns or midnight temperature.

## 2019-11-24 NOTE — Progress Notes (Signed)
NAME:  Ana Newman, MRN:  [id number], DOB:  08-09-1935, LOS: 2 ADMISSION DATE:  11/22/2019, CONSULTATION DATE: 11/23/19   REFERRING MD:  Lanier Clam, MDCHIEF COMPLAINT:  Abdominal pain (wailing in room)   Brief History   84 year old with recurrent UTI admitted with abdominal pain and hypotension with concern for septic shock and possible UGIB given concern for hematemesis whom we are consulted for evaluation of shock.  History of present illness   History per EMR. Patient in pain, unable to provide history, moaning in pain  Past Medical History  HTN, HLD  Significant Hospital Events   Admitted 10/21  Consults:  PCCM  10/22 GI 10/22 Renal 10/22    Procedures:   R IJ  10/21 >>>  Significant Diagnostic Tests:  CT renal protocol: 10/21 bilateral stone w/o hydro  Micro Data:  COVID/ flu A/B  10/21 ng  UC  10/21 multiple species  MRSA  PRCR  10/22 neg BC x 2  10/21 >>>  Antimicrobials:  Vanc10/20  Only    Rocephin 10/20 only  Cefepime 10/21 >>   Scheduled Meds: . Chlorhexidine Gluconate Cloth  6 each Topical Daily  . mouth rinse  15 mL Mouth Rinse BID  . [START ON 11/25/2019] pantoprazole  40 mg Intravenous Q12H  . polyvinyl alcohol  1 drop Both Eyes TID  . senna  2 tablet Oral QHS   Continuous Infusions: . sodium chloride Stopped (11/24/19 1124)  . ceFEPime (MAXIPIME) IV 1 g (11/24/19 1124)  . dextrose 5 % and 0.2 % NaCl 100 mL/hr at 11/24/19 1124  . norepinephrine (LEVOPHED) Adult infusion 3 mcg/min (11/24/19 1332)  . pantoprozole (PROTONIX) infusion 8 mg/hr (11/24/19 1124)   PRN Meds:.bisacodyl, ondansetron **OR** ondansetron (ZOFRAN) IV, promethazine   Interim history/subjective:   still on levophed/ very poor po intake, no specific complaints   Objective   Blood pressure (!) 104/30, pulse 68, temperature 98.3 F (36.8 C), temperature source Axillary, resp. rate (!) 23, height 5\' 1"  (1.549 m), weight 63.5 kg, SpO2 94 %.         Intake/Output Summary (Last 24 hours) at 11/24/2019 1425 Last data filed at 11/24/2019 1124 Gross per 24 hour  Intake 4244.81 ml  Output 900 ml  Net 3344.81 ml   Filed Weights   11/22/19 0039  Weight: 63.5 kg    Examination: Tmax 99.3  Pt alert, approp nad @ 45 degrees on Room air sats and wob acceptable  Looks more chronically than acutely ill Oropharynx clear,  Mucosa dry  Neck supple Lungs with a few scattered exp > insp rhonchi bilaterally RRR no s3 or or sign murmur Abd obese with nl  excursion  Neuro : very little spont speech         Resolved Hospital Problem list   n/a  Assessment & Plan:  Septic shock: Creatinine elevated, lactic acid elevated, presumed urinary source with dirty UA.  --MAP > 65 gpal, still on levoped am 10/23    - check  cvp and expand with NS if needed   Abdominal Pain: Concern for possible renal cholic/pyelo given likey UTI. Report of hematemesis raises concern for GI ulcer. Most likely constipation ,exam non-focal.  --See below --PRN pain meds  Hematemesis/black emesis: Hgb drop but in setting of aggressive IV hydration,  --IV protonix per GI    Anemia:  Lab Results  Component Value Date   HGB 7.4 (L) 11/24/2019   HGB 7.4 (L) 11/23/2019   HGB 8.7 (  L) 11/22/2019   Hgb trensing down. Treating medically for possible UGIB. Element of dilution with IVFs. --Trend Hgb since levophed dep may vol expand with prbc's if cvp low   AKI: ATN in setting of hypotension, shock. Lab Results  Component Value Date   CREATININE 1.53 (H) 11/24/2019   CREATININE 2.56 (H) 11/23/2019   CREATININE 2.56 (H) 11/23/2019  - Cr mildly improving with fluids, pressors >>> avoid nephrotoxins/ check cvps for adequate vol replacement   Hypokalemia  - correct to normal since creat imprving     Best practice:  Per primary  Labs   CBC: Recent Labs  Lab 11/22/19 0049 11/22/19 0313 11/22/19 1150 11/23/19 1414 11/24/19 0310  WBC 20.5* 12.7* 12.2*  14.0* 12.3*  NEUTROABS 17.9*  --   --   --   --   HGB 11.0* 9.5* 8.7* 7.4* 7.4*  HCT 35.9* 31.4* 27.1* 23.7* 22.9*  MCV 104.1* 103.0* 99.3 98.8 98.7  PLT 443* 310 291 259 762    Basic Metabolic Panel: Recent Labs  Lab 11/22/19 0049 11/22/19 0313 11/22/19 2323 11/23/19 0418 11/24/19 0310  NA 136 137 147* 143  144 142  K 6.9* 5.8* 4.6 4.2  4.2 3.3*  CL 101 105 114* 111  111 104  CO2 14* 14* 15* 16*  17* 26  GLUCOSE 144* 225* 97 98  99 198*  BUN 142* 134* 96* 97*  96* 60*  CREATININE 4.39* 3.71* 2.66* 2.56*  2.56* 1.53*  CALCIUM 8.6* 7.7* 7.5* 7.2*  7.2* 7.1*  PHOS  --   --   --  4.0 1.8*   GFR: Estimated Creatinine Clearance: 24.2 mL/min (A) (by C-G formula based on SCr of 1.53 mg/dL (H)). Recent Labs  Lab 11/22/19 0313 11/22/19 0525 11/22/19 0910 11/22/19 1150 11/23/19 0418 11/23/19 1414 11/24/19 0310  PROCALCITON  --   --   --   --  7.32  --   --   WBC 12.7*  --   --  12.2*  --  14.0* 12.3*  LATICACIDVEN 4.5* 3.3* 1.4  --   --   --   --     Liver Function Tests: Recent Labs  Lab 11/22/19 2323 11/23/19 0418 11/24/19 0310  AST 181* 149*  --   ALT 262* 244*  --   ALKPHOS 51 52  --   BILITOT 0.9 1.2  --   PROT 5.3* 5.1*  --   ALBUMIN 2.1* 2.1*  2.2* 2.0*   Recent Labs  Lab 11/22/19 0049  LIPASE 63*   No results for input(s): AMMONIA in the last 168 hours.  ABG No results found for: PHART, PCO2ART, PO2ART, HCO3, TCO2, ACIDBASEDEF, O2SAT   Coagulation Profile: Recent Labs  Lab 11/23/19 0418  INR 1.5*    Cardiac Enzymes: No results for input(s): CKTOTAL, CKMB, CKMBINDEX, TROPONINI in the last 168 hours.  HbA1C: Hemoglobin A1C  Date/Time Value Ref Range Status  08/24/2017 12:00 AM 6.9  Final  06/17/2017 12:00 AM 6.0  Final   Hgb A1c MFr Bld  Date/Time Value Ref Range Status  11/22/2019 11:23 PM 5.5 4.8 - 5.6 % Final    Comment:    (NOTE)         Prediabetes: 5.7 - 6.4         Diabetes: >6.4         Glycemic control for adults  with diabetes: <7.0     CBG: Recent Labs  Lab 11/23/19 1119 11/23/19 1623 11/23/19 2112 11/24/19 0728 11/24/19  [place]*      The patient is critically ill with multiple organ systems failure and requires high complexity decision making for assessment and support, frequent evaluation and titration of therapies, application of advanced monitoring technologies and extensive interpretation of multiple databases. Critical Care Time devoted to patient care services described in this note is 35 minutes.    Christinia Gully, MD [facility] [phone]   After 7:00 pm call Elink  [phone]

## 2019-11-25 ENCOUNTER — Inpatient Hospital Stay (HOSPITAL_COMMUNITY): Payer: Medicare Other

## 2019-11-25 DIAGNOSIS — L89154 Pressure ulcer of sacral region, stage 4: Secondary | ICD-10-CM

## 2019-11-25 DIAGNOSIS — N390 Urinary tract infection, site not specified: Secondary | ICD-10-CM | POA: Diagnosis not present

## 2019-11-25 DIAGNOSIS — D500 Iron deficiency anemia secondary to blood loss (chronic): Secondary | ICD-10-CM

## 2019-11-25 DIAGNOSIS — A419 Sepsis, unspecified organism: Secondary | ICD-10-CM | POA: Diagnosis not present

## 2019-11-25 LAB — TYPE AND SCREEN
ABO/RH(D): B NEG
Antibody Screen: NEGATIVE

## 2019-11-25 LAB — BASIC METABOLIC PANEL
Anion gap: 10 (ref 5–15)
BUN: 25 mg/dL — ABNORMAL HIGH (ref 8–23)
CO2: 20 mmol/L — ABNORMAL LOW (ref 22–32)
Calcium: 7.3 mg/dL — ABNORMAL LOW (ref 8.9–10.3)
Chloride: 112 mmol/L — ABNORMAL HIGH (ref 98–111)
Creatinine, Ser: 0.94 mg/dL (ref 0.44–1.00)
GFR, Estimated: >60 mL/min (ref >60)
Glucose, Bld: 120 mg/dL — ABNORMAL HIGH (ref 70–99)
Potassium: 4.3 mmol/L (ref 3.5–5.1)
Sodium: 142 mmol/L (ref 135–145)

## 2019-11-25 LAB — RENAL FUNCTION PANEL
Albumin: 1.7 g/dL — ABNORMAL LOW (ref 3.5–5.0)
Anion gap: 7 (ref 5–15)
BUN: 32 mg/dL — ABNORMAL HIGH (ref 8–23)
CO2: 25 mmol/L (ref 22–32)
Calcium: 6.7 mg/dL — ABNORMAL LOW (ref 8.9–10.3)
Chloride: 107 mmol/L (ref 98–111)
Creatinine, Ser: 1 mg/dL (ref 0.44–1.00)
GFR, Estimated: 56 mL/min — ABNORMAL LOW (ref >60)
Glucose, Bld: 102 mg/dL — ABNORMAL HIGH (ref 70–99)
Phosphorus: 1.2 mg/dL — ABNORMAL LOW (ref 2.5–4.6)
Potassium: 3.4 mmol/L — ABNORMAL LOW (ref 3.5–5.1)
Sodium: 139 mmol/L (ref 135–145)

## 2019-11-25 LAB — GLUCOSE, CAPILLARY
Glucose-Capillary: 96 mg/dL (ref 70–99)
Glucose-Capillary: 83 mg/dL (ref 70–99)
Glucose-Capillary: 104 mg/dL — ABNORMAL HIGH (ref 70–99)
Glucose-Capillary: 109 mg/dL — ABNORMAL HIGH (ref 70–99)
Glucose-Capillary: 103 mg/dL — ABNORMAL HIGH (ref 70–99)

## 2019-11-25 LAB — CBC
HCT: 22 % — ABNORMAL LOW (ref 36.0–46.0)
Hemoglobin: 6.8 g/dL — CL (ref 12.0–15.0)
MCH: 32.1 pg (ref 26.0–34.0)
MCHC: 30.9 g/dL (ref 30.0–36.0)
MCV: 103.8 fL — ABNORMAL HIGH (ref 80.0–100.0)
Platelets: 199 10*3/uL (ref 150–400)
RBC: 2.12 MIL/uL — ABNORMAL LOW (ref 3.87–5.11)
RDW: 14.2 % (ref 11.5–15.5)
WBC: 10.1 10*3/uL (ref 4.0–10.5)
nRBC: 0.2 % (ref 0.0–0.2)

## 2019-11-25 LAB — HEMOGLOBIN AND HEMATOCRIT, BLOOD
HCT: 30.4 % — ABNORMAL LOW (ref 36.0–46.0)
Hemoglobin: 9.7 g/dL — ABNORMAL LOW (ref 12.0–15.0)

## 2019-11-25 LAB — PREPARE RBC (CROSSMATCH)

## 2019-11-25 MED ORDER — SODIUM CHLORIDE 0.9 % IV SOLN
2.0000 g | Freq: Two times a day (BID) | INTRAVENOUS | Status: AC
  Administered 2019-11-25 – 2019-11-26 (×3): 2 g via INTRAVENOUS
  Filled 2019-11-25 (×3): qty 2

## 2019-11-25 MED ORDER — PANTOPRAZOLE SODIUM 40 MG PO TBEC
40.0000 mg | DELAYED_RELEASE_TABLET | Freq: Two times a day (BID) | ORAL | Status: DC
  Administered 2019-11-25 – 2019-12-01 (×12): 40 mg via ORAL
  Filled 2019-11-25 (×12): qty 1

## 2019-11-25 MED ORDER — SODIUM CHLORIDE 0.9% FLUSH: 10.0000 mL | INTRAVENOUS | Status: DC | PRN

## 2019-11-25 MED ORDER — SODIUM CHLORIDE 0.9% FLUSH
10.0000 mL | Freq: Two times a day (BID) | INTRAVENOUS | Status: DC
  Administered 2019-11-25 – 2019-11-29 (×7): 10 mL

## 2019-11-25 MED ORDER — SODIUM CHLORIDE 0.9% IV SOLUTION: Freq: Once | INTRAVENOUS | Status: AC

## 2019-11-25 NOTE — Progress Notes (Signed)
Pharmacy Antibiotic Note  Ana Newman is a 84 y.o. female admitted on 11/22/2019 with abdominal pain, hematemesis.  CT renal stone study show stones w/o hydronephrosis and showed a distended gallbladder w/o acute inflammation.  CXR without acute cardiopulmonary disease.  Pharmacy has been consulted for Cefepime dosing for suspected urosepsis.  Today, 11/25/2019:  D4 abx  Remains afebrile  WBC normalized  AKI resolved; SCr at baseline  Off pressors this AM   Plan:  Increase cefepime to 2g IV q12 hr with improved CrCl  Pharmacy will sign off of note writing unless adjustment or other action needed   Height: 5\' 1"  (154.9 cm) Weight: 63.5 kg (140 lb) IBW/kg (Calculated) : 47.8  Temp (24hrs), Avg:98.2 F (36.8 C), Min:97.8 F (36.6 C), Max:98.6 F (37 C)  Recent Labs  Lab 11/22/19 0313 11/22/19 0525 11/22/19 0910 11/22/19 1150 11/22/19 2323 11/23/19 0418 11/23/19 1414 11/24/19 0310 11/25/19 0354  WBC 12.7*  --   --  12.2*  --   --  14.0* 12.3* 10.1  CREATININE 3.71*  --   --   --  2.66* 2.56*  2.56*  --  1.53* 1.00  LATICACIDVEN 4.5* 3.3* 1.4  --   --   --   --   --   --     Estimated Creatinine Clearance: 37 mL/min (by C-G formula based on SCr of 1 mg/dL).    Allergies  Allergen Reactions  . 5-Alpha Reductase Inhibitors     Antimicrobials this admission:  10/21 Ceftriaxone x1 10/21 Vancomycin >>10/22 10/21 Cefepime >>   Dose adjustments this admission: 10/24: increase cefepime to 2g q12 with improved CrCl  Microbiology results: 10/21 UCx: mult sp. F 10/21 COVID: neg; Influenza A/B: neg/neg 10/21 BCx (x1): ngtd   Thank you for allowing pharmacy to be a part of this patient's care.  Reuel Boom, PharmD, BCPS [phone] 11/25/2019, 10:24 AM

## 2019-11-25 NOTE — Progress Notes (Signed)
Attempted to contact Ms. Goracke's daughter for consent to transfuse blood products. I got no answer at the number listed, and was unable to leave a voicemail. [facility] informed, will attempt to contact before change of shift.

## 2019-11-25 NOTE — Progress Notes (Signed)
NAME:  Ana Newman, MRN:  [id number], DOB:  11/16/1935, LOS: 3 ADMISSION DATE:  11/22/2019, CONSULTATION DATE: 11/23/19   REFERRING MD:  Lanier Clam, MDCHIEF COMPLAINT:  Abdominal pain (wailing in room)   Brief History   84 year old with recurrent UTI admitted with abdominal pain and hypotension with concern for septic shock and possible UGIB given concern for hematemesis whom we are consulted for evaluation of shock.  History of present illness   History per EMR. Patient in pain, unable to provide history, moaning in pain  Past Medical History  HTN, HLD  Significant Hospital Events   Admitted 10/21  Consults:  PCCM  10/22 GI 10/22 Renal 10/22    Procedures:   R IJ  10/21 >>>  Significant Diagnostic Tests:  CT renal protocol: 10/21 bilateral stone w/o hydro  Micro Data:  COVID/ flu A/B  10/21 ng  UC  10/21 multiple species  MRSA  PRCR  10/22 neg BC x 2  10/21 >>>  Antimicrobials:  Vanc10/20  Only    Rocephin 10/20 only  Cefepime 10/21 >>   Scheduled Meds: . Chlorhexidine Gluconate Cloth  6 each Topical Daily  . mouth rinse  15 mL Mouth Rinse BID  . pantoprazole (PROTONIX) IV  40 mg Intravenous Daily  . polyvinyl alcohol  1 drop Both Eyes TID  . senna  2 tablet Oral QHS  . sodium chloride flush  10-40 mL Intracatheter Q12H   Continuous Infusions: . sodium chloride 10 mL/hr at 11/25/19 0628  . ceFEPime (MAXIPIME) IV Stopped (11/24/19 1850)  . dextrose 5 % and 0.2 % NaCl 100 mL/hr at 11/25/19 0700  . norepinephrine (LEVOPHED) Adult infusion Stopped (11/25/19 0210)   PRN Meds:.bisacodyl, ondansetron **OR** ondansetron (ZOFRAN) IV, promethazine, sodium chloride flush   Interim history/subjective:  Knows name and [facility] name but o/w disoriented / intermittently fussy Off pressors since early am today  Objective   Blood pressure (!) 130/58, pulse 85, temperature 97.9 F (36.6 C), temperature source Oral, resp. rate (!) 26, height 5\' 1"  (1.549 m),  weight 63.5 kg, SpO2 96 %. CVP:  [6 mmHg-9 mmHg] 9 mmHg      Intake/Output Summary (Last 24 hours) at 11/25/2019 0846 Last data filed at 11/25/2019 3846 Gross per 24 hour  Intake 2676.94 ml  Output 1400 ml  Net 1276.94 ml   Filed Weights   11/22/19 0039  Weight: 63.5 kg    Examination: Tmax   98.6   Pt alert, but only oriented to person, [facility] No jvd Oropharynx clear,  mucosa dry  Neck supple Lungs with a few scattered exp > insp rhonchi bilaterally RRR no s3 or or sign murmur Abd nl excursion and tol po ok  Extr warm with no edema or clubbing noted Neuro  Sensorium as above, no apparent motor deficits         Resolved Hospital Problem list   n/a  Assessment & Plan:  Septic shock: Creatinine elevated, lactic acid elevated, presumed urinary source with dirty UA.  --MAP > 65 goal >>> off pressors as of early am today and hope to keep off esp p trx one unit prbcs   Abdominal Pain: Concern for possible renal cholic/pyelo given likey UTI. Report of hematemesis raises concern for GI ulcer. Most likely constipation,exam non-focal.  -- See below -- PRN pain meds  Hematemesis/black emesis: Hgb drop but in setting of aggressive IV hydration,  --IV protonix per GI    Anemia:  Lab Results  Component Value Date   HGB 6.8 (LL) 11/25/2019   HGB 7.4 (L) 11/24/2019   HGB 7.4 (L) 11/23/2019  >>> trx x one unit am 10/24     AKI: ATN in setting of hypotension, shock. 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    Basic Metabolic Panel: Recent Labs  Lab 11/22/19 0313 11/22/19 2323 11/23/19 0418 11/24/19 0310 11/25/19 0354  NA 137 147* 143  144 142 139  K 5.8* 4.6 4.2  4.2 3.3* 3.4*  CL 105 114* 111  111 104 107  CO2 14* 15* 16*  17* 26 25  GLUCOSE 225* 97 98  99 198* 102*  BUN 134* 96* 97*  96* 60* 32*  CREATININE 3.71* 2.66* 2.56*  2.56* 1.53* 1.00  CALCIUM 7.7* 7.5* 7.2*  7.2* 7.1* 6.7*  PHOS  --   --  4.0 1.8* 1.2*   GFR: Estimated Creatinine Clearance: 37 mL/min (by C-G formula based on SCr of 1 mg/dL). Recent Labs  Lab 11/22/19 0313 11/22/19 0313 11/22/19 0525 11/22/19 0910 11/22/19 1150 11/23/19 0418 11/23/19 1414 11/24/19 0310 11/25/19 0354  PROCALCITON  --   --   --   --   --  7.32  --   --   --   WBC 12.7*   < >  --   --  12.2*  --  14.0* 12.3* 10.1  LATICACIDVEN 4.5*  --  3.3* 1.4  --   --   --   --   --    < > = values in this interval not displayed.    Liver Function Tests: Recent Labs  Lab 11/22/19 2323 11/23/19 0418 11/24/19 0310 11/25/19 0354  AST 181* 149*  --   --   ALT 262* 244*  --   --   ALKPHOS 51 52  --   --   BILITOT 0.9 1.2  --   --   PROT 5.3* 5.1*  --   --   ALBUMIN 2.1* 2.1*  2.2* 2.0* 1.7*   Recent Labs  Lab 11/22/19 0049  LIPASE 63*   No results for input(s): AMMONIA in the last 168 hours.  ABG No results found for: PHART, PCO2ART, PO2ART, HCO3, TCO2, ACIDBASEDEF, O2SAT   Coagulation Profile: Recent Labs  Lab 11/23/19 0418  INR 1.5*    Cardiac Enzymes: No  results for input(s): CKTOTAL, CKMB, CKMBINDEX, TROPONINI in the last 168 hours.  HbA1C: Hemoglobin A1C  Date/Time Value Ref Range Status  08/24/2017 12:00 AM 6.9  Final  06/17/2017 12:00 AM 6.0  Final   Hgb A1c MFr Bld  Date/Time Value Ref Range Status  11/22/2019 11:23 PM 5.5 4.8 - 5.6 % Final    Comment:    (NOTE)         Prediabetes: 5.7 - 6.4         Diabetes: >6.4         Glycemic control for adults with diabetes: <7.0     CBG: Recent Labs  Lab 11/24/19 0728 11/24/19 1131 11/24/19 1727 11/24/19 2135 11/25/19 0739  GLUCAP 168* 167* 80 98 96      Christinia Gully, MD [facility] [place] [phone]   After 7:00 pm call Elink  [phone]

## 2019-11-25 NOTE — Progress Notes (Signed)
Patient noted to have edema on left upper extremity upon hand off from critical care RN. This RN continued to monitor extremity. This RN appreciated increased swelling upon most recent assessment of extremity. Dr. Silas Flood notified.

## 2019-11-25 NOTE — Progress Notes (Signed)
eLink Physician-Brief Progress Note Patient Name: Ana Newman DOB: 09-10-1935 MRN: [id number]   Date of Service  11/25/2019  HPI/Events of Note  Hg < 8. Septic shock. K 3.4, up and down GFR.   eICU Interventions  1 units PRBC for over 3 hrs. Follow Hg post.      Intervention Category Intermediate Interventions: Diagnostic test evaluation;Other:  Elmer Sow 11/25/2019, 4:51 AM

## 2019-11-26 ENCOUNTER — Inpatient Hospital Stay (HOSPITAL_COMMUNITY): Payer: Medicare Other

## 2019-11-26 DIAGNOSIS — R609 Edema, unspecified: Secondary | ICD-10-CM | POA: Diagnosis not present

## 2019-11-26 LAB — GLUCOSE, CAPILLARY
Glucose-Capillary: 105 mg/dL — ABNORMAL HIGH (ref 70–99)
Glucose-Capillary: 98 mg/dL (ref 70–99)
Glucose-Capillary: 138 mg/dL — ABNORMAL HIGH (ref 70–99)
Glucose-Capillary: 90 mg/dL (ref 70–99)

## 2019-11-26 LAB — URINALYSIS, ROUTINE W REFLEX MICROSCOPIC
Bacteria, UA: NONE SEEN
Bilirubin Urine: NEGATIVE
Glucose, UA: NEGATIVE mg/dL
Ketones, ur: NEGATIVE mg/dL
Nitrite: NEGATIVE
Protein, ur: NEGATIVE mg/dL
Specific Gravity, Urine: 1.005 (ref 1.005–1.030)
pH: 7 (ref 5.0–8.0)

## 2019-11-26 LAB — BPAM RBC
Blood Product Expiration Date: 202111232359
ISSUE DATE / TIME: 202110240817
Unit Type and Rh: 1700

## 2019-11-26 LAB — BASIC METABOLIC PANEL
Anion gap: 8 (ref 5–15)
BUN: 18 mg/dL (ref 8–23)
CO2: 19 mmol/L — ABNORMAL LOW (ref 22–32)
Calcium: 7.2 mg/dL — ABNORMAL LOW (ref 8.9–10.3)
Chloride: 109 mmol/L (ref 98–111)
Creatinine, Ser: 0.84 mg/dL (ref 0.44–1.00)
GFR, Estimated: >60 mL/min (ref >60)
Glucose, Bld: 116 mg/dL — ABNORMAL HIGH (ref 70–99)
Potassium: 4 mmol/L (ref 3.5–5.1)
Sodium: 136 mmol/L (ref 135–145)

## 2019-11-26 LAB — CBC
HCT: 29.4 % — ABNORMAL LOW (ref 36.0–46.0)
HCT: 30 % — ABNORMAL LOW (ref 36.0–46.0)
Hemoglobin: 9.8 g/dL — ABNORMAL LOW (ref 12.0–15.0)
Hemoglobin: 9.3 g/dL — ABNORMAL LOW (ref 12.0–15.0)
MCH: 29.9 pg (ref 26.0–34.0)
MCH: 29.7 pg (ref 26.0–34.0)
MCHC: 33.3 g/dL (ref 30.0–36.0)
MCHC: 31 g/dL (ref 30.0–36.0)
MCV: 89.6 fL (ref 80.0–100.0)
MCV: 95.8 fL (ref 80.0–100.0)
Platelets: 178 10*3/uL (ref 150–400)
Platelets: 192 10*3/uL (ref 150–400)
RBC: 3.28 MIL/uL — ABNORMAL LOW (ref 3.87–5.11)
RBC: 3.13 MIL/uL — ABNORMAL LOW (ref 3.87–5.11)
RDW: 19.7 % — ABNORMAL HIGH (ref 11.5–15.5)
RDW: 19.4 % — ABNORMAL HIGH (ref 11.5–15.5)
WBC: 11.5 10*3/uL — ABNORMAL HIGH (ref 4.0–10.5)
WBC: 9.5 10*3/uL (ref 4.0–10.5)
nRBC: 0.2 % (ref 0.0–0.2)
nRBC: 0 % (ref 0.0–0.2)

## 2019-11-26 LAB — TYPE AND SCREEN
ABO/RH(D): B NEG
Antibody Screen: NEGATIVE
Unit division: 0

## 2019-11-26 LAB — BRAIN NATRIURETIC PEPTIDE: B Natriuretic Peptide: 366.4 pg/mL — ABNORMAL HIGH (ref 0.0–100.0)

## 2019-11-26 MED ORDER — ALBUMIN HUMAN 25 % IV SOLN
25.0000 g | Freq: Once | INTRAVENOUS | Status: AC
  Administered 2019-11-26: 12.5 g via INTRAVENOUS
  Filled 2019-11-26 (×2): qty 100

## 2019-11-26 MED ORDER — FUROSEMIDE 10 MG/ML IJ SOLN
40.0000 mg | Freq: Once | INTRAMUSCULAR | Status: AC
  Administered 2019-11-26: 40 mg via INTRAVENOUS
  Filled 2019-11-26: qty 4

## 2019-11-26 MED ORDER — POLYETHYLENE GLYCOL 3350 17 G PO PACK
17.0000 g | PACK | Freq: Every day | ORAL | Status: DC
  Administered 2019-11-26: 17 g via ORAL
  Filled 2019-11-26 (×3): qty 1

## 2019-11-26 MED ORDER — FUROSEMIDE 10 MG/ML IJ SOLN
40.0000 mg | Freq: Two times a day (BID) | INTRAMUSCULAR | Status: DC
  Administered 2019-11-27 – 2019-11-29 (×6): 40 mg via INTRAVENOUS
  Filled 2019-11-26 (×6): qty 4

## 2019-11-26 MED ORDER — MELATONIN 5 MG PO TABS
5.0000 mg | ORAL_TABLET | Freq: Every evening | ORAL | Status: DC | PRN
  Administered 2019-11-26 – 2019-11-30 (×5): 5 mg via ORAL
  Filled 2019-11-26 (×5): qty 1

## 2019-11-26 MED ORDER — SODIUM CHLORIDE 0.9 % IV SOLN
2.0000 g | INTRAVENOUS | Status: AC
  Administered 2019-11-27: 2 g via INTRAVENOUS
  Filled 2019-11-26: qty 2
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
## 2019-11-26 NOTE — Care Management Important Message (Signed)
Important Message  Patient Details IM Letter placed in door cubby to be presented to the Patient Name: Ana Newman MRN: [id number] Date of Birth: [birth year]   Medicare Important Message Given:  Yes     Kerin Salen 11/26/2019, 12:02 PM

## 2019-11-26 NOTE — Progress Notes (Signed)
[facility] Gastroenterology Progress Note  Ana Newman 84 y.o. 11-Feb-1935  CC:  Coffee ground emesis  Subjective: Patient denies any abdominal pain, nausea, or vomiting.  Per RN, patient not eating her meals.  Also, no further signs of GI bleeding (last BM was brown).  ROS : No reported complaints, though limited by patient status (dementia)  Objective: Vital signs in last 24 hours: Vitals:   11/25/19 2026 11/25/19 2351  BP: 139/64 135/72  Pulse: 82 81  Resp: (!) 21 18  Temp: 99 F (37.2 C) 98 F (36.7 C)  SpO2: 98% 100%    Physical Exam:  General:  Alert, disoriented but cooperative, no acute distress  Head:  Normocephalic, without obvious abnormality, atraumatic  Eyes:  Anicteric sclera, EOMs intact  Lungs:   Clear to auscultation bilaterally, respirations unlabored  Heart:  Regular rate and rhythm, S1, S2 normal  Abdomen:   Soft, non-tender, non-distended, bowel sounds active all four quadrants,  no guarding or peritoneal signs  Extremities: Left upper extremity edema  Pulses: 2+ and symmetric    Lab Results: Recent Labs    11/24/19 0310 11/24/19 0310 11/25/19 0354 11/25/19 1412  NA 142   < > 139 142  K 3.3*   < > 3.4* 4.3  CL 104   < > 107 112*  CO2 26   < > 25 20*  GLUCOSE 198*   < > 102* 120*  BUN 60*   < > 32* 25*  CREATININE 1.53*   < > 1.00 0.94  CALCIUM 7.1*   < > 6.7* 7.3*  PHOS 1.8*  --  1.2*  --    < > = values in this interval not displayed.   Recent Labs    11/24/19 0310 11/25/19 0354  ALBUMIN 2.0* 1.7*   Recent Labs    11/25/19 1412 11/25/19 1412 11/25/19 2053 11/26/19 0505  WBC 11.5*  --   --  9.5  HGB 9.8*   < > 9.7* 9.3*  HCT 29.4*   < > 30.4* 30.0*  MCV 89.6  --   --  95.8  PLT 178  --   --  192   < > = values in this interval not displayed.   No results for input(s): LABPROT, INR in the last 72 hours.    Assessment: Coffee ground emesis, resolved after initiation of PPI therapy -CBC 9.3 today, improved from 6.8  yesterday s/p 1u pRBCs yesterday. -No current signs of GI bleeding.  She had a brown liquid stool this morning.  Septic shock, UTI  AKI, resolved  Alzheimer's disease  Plan: Continue Protonix 40 mg PO BID for 2 months, then once daily indefinitely.  Continue to monitor H&H with transfusion as needed to maintain Hgb >7.  [facility] will sign off.  Thank you for the consultation. Please contact us if we can be of any further assistance during this hospital stay.    Salley Slaughter PA-C 11/26/2019, 9:00 AM  Contact #  [phone]

## 2019-11-26 NOTE — Progress Notes (Signed)
Upper extremity venous LT study completed.  Preliminary results relayed to Florene Glen, MD.   See CV Proc for preliminary results report.   Darlin Coco, RDMS

## 2019-11-27 ENCOUNTER — Inpatient Hospital Stay (HOSPITAL_COMMUNITY): Payer: Medicare Other

## 2019-11-27 DIAGNOSIS — R609 Edema, unspecified: Secondary | ICD-10-CM

## 2019-11-27 DIAGNOSIS — E8770 Fluid overload, unspecified: Secondary | ICD-10-CM | POA: Diagnosis not present

## 2019-11-27 DIAGNOSIS — I361 Nonrheumatic tricuspid (valve) insufficiency: Secondary | ICD-10-CM

## 2019-11-27 LAB — CBC WITH DIFFERENTIAL/PLATELET
Abs Immature Granulocytes: 0.12 10*3/uL — ABNORMAL HIGH (ref 0.00–0.07)
Basophils Absolute: 0 10*3/uL (ref 0.0–0.1)
Basophils Relative: 0 %
Eosinophils Absolute: 0.2 10*3/uL (ref 0.0–0.5)
Eosinophils Relative: 2 %
HCT: 29.6 % — ABNORMAL LOW (ref 36.0–46.0)
Hemoglobin: 9.1 g/dL — ABNORMAL LOW (ref 12.0–15.0)
Immature Granulocytes: 1 %
Lymphocytes Relative: 28 %
Lymphs Abs: 2.5 10*3/uL (ref 0.7–4.0)
MCH: 29.9 pg (ref 26.0–34.0)
MCHC: 30.7 g/dL (ref 30.0–36.0)
MCV: 97.4 fL (ref 80.0–100.0)
Monocytes Absolute: 1 10*3/uL (ref 0.1–1.0)
Monocytes Relative: 11 %
Neutro Abs: 5.3 10*3/uL (ref 1.7–7.7)
Neutrophils Relative %: 58 %
Platelets: 171 10*3/uL (ref 150–400)
RBC: 3.04 MIL/uL — ABNORMAL LOW (ref 3.87–5.11)
RDW: 18.3 % — ABNORMAL HIGH (ref 11.5–15.5)
WBC: 9.2 10*3/uL (ref 4.0–10.5)
nRBC: 0 % (ref 0.0–0.2)

## 2019-11-27 LAB — GLUCOSE, CAPILLARY
Glucose-Capillary: 75 mg/dL (ref 70–99)
Glucose-Capillary: 78 mg/dL (ref 70–99)
Glucose-Capillary: 88 mg/dL (ref 70–99)
Glucose-Capillary: 86 mg/dL (ref 70–99)

## 2019-11-27 LAB — COMPREHENSIVE METABOLIC PANEL
ALT: 60 U/L — ABNORMAL HIGH (ref 0–44)
AST: 21 U/L (ref 15–41)
Albumin: 2.2 g/dL — ABNORMAL LOW (ref 3.5–5.0)
Alkaline Phosphatase: 53 U/L (ref 38–126)
Anion gap: 7 (ref 5–15)
BUN: 14 mg/dL (ref 8–23)
CO2: 24 mmol/L (ref 22–32)
Calcium: 7.3 mg/dL — ABNORMAL LOW (ref 8.9–10.3)
Chloride: 108 mmol/L (ref 98–111)
Creatinine, Ser: 0.85 mg/dL (ref 0.44–1.00)
GFR, Estimated: >60 mL/min (ref >60)
Glucose, Bld: 86 mg/dL (ref 70–99)
Potassium: 3.4 mmol/L — ABNORMAL LOW (ref 3.5–5.1)
Sodium: 139 mmol/L (ref 135–145)
Total Bilirubin: 0.6 mg/dL (ref 0.3–1.2)
Total Protein: 4.9 g/dL — ABNORMAL LOW (ref 6.5–8.1)

## 2019-11-27 LAB — ECHOCARDIOGRAM COMPLETE
Area-P 1/2: 2.6 cm2
Calc EF: 70.6 %
Height: 61 in
S' Lateral: 2.2 cm
Single Plane A2C EF: 73.4 %
Single Plane A4C EF: 71.6 %
Weight: 2240 oz

## 2019-11-27 LAB — CULTURE, BLOOD (ROUTINE X 2)
Culture: NO GROWTH
Special Requests: ADEQUATE

## 2019-11-27 LAB — PHOSPHORUS: Phosphorus: 1.5 mg/dL — ABNORMAL LOW (ref 2.5–4.6)

## 2019-11-27 LAB — MAGNESIUM: Magnesium: 1.4 mg/dL — ABNORMAL LOW (ref 1.7–2.4)

## 2019-11-27 MED ORDER — K PHOS MONO-SOD PHOS DI & MONO 155-852-130 MG PO TABS
500.0000 mg | ORAL_TABLET | Freq: Four times a day (QID) | ORAL | Status: AC
  Administered 2019-11-27 (×4): 500 mg via ORAL
  Filled 2019-11-27 (×4): qty 2

## 2019-11-27 MED ORDER — MAGNESIUM SULFATE 4 GM/100ML IV SOLN: 4.0000 g | Freq: Once | INTRAVENOUS | Status: DC

## 2019-11-27 MED ORDER — COLLAGENASE 250 UNIT/GM EX OINT
TOPICAL_OINTMENT | Freq: Every day | CUTANEOUS | Status: DC
  Filled 2019-11-27: qty 30

## 2019-11-27 MED ORDER — POTASSIUM CHLORIDE CRYS ER 20 MEQ PO TBCR
40.0000 meq | EXTENDED_RELEASE_TABLET | Freq: Once | ORAL | Status: AC
  Administered 2019-11-27: 40 meq via ORAL
  Filled 2019-11-27: qty 2

## 2019-11-27 MED ORDER — MAGNESIUM SULFATE 2 GM/50ML IV SOLN
2.0000 g | Freq: Once | INTRAVENOUS | Status: AC
  Administered 2019-11-27: 2 g via INTRAVENOUS
  Filled 2019-11-27: qty 50

## 2019-11-27 NOTE — Progress Notes (Addendum)
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 Distal                                             Not visualized +---------+---------------+---------+-----------+----------+--------------+ PFV                                                   Not visualized +---------+---------------+---------+-----------+----------+--------------+ POP                     Yes      Yes                                 +---------+---------------+---------+-----------+----------+--------------+ PTV      Full                                                        +---------+---------------+---------+-----------+----------+--------------+ PERO                                                  Not visualized  +---------+---------------+---------+-----------+----------+--------------+     Summary: RIGHT: - There is no evidence of deep vein thrombosis in the lower extremity. However, portions of this examination were limited- see technologist comments above.  - No cystic structure found in the popliteal fossa.  LEFT: - There is no evidence of deep vein thrombosis in the lower extremity. However, portions of this examination were limited- see technologist comments above.  - No cystic structure found in the popliteal fossa.  *See table(s) above for measurements and observations.    Preliminary    VAS [place] UPPER EXTREMITY VENOUS DUPLEX  Result Date: 11/26/2019 UPPER VENOUS STUDY  Indications: Swelling, and Pain Limitations: Altered mental status, patient intolerance to probe pressure, and inability to reposition due to pain/mental status. Comparison Study: No prior studies. Performing Technologist: Darlin Coco  Examination Guidelines: Rayan Dyal  complete evaluation includes B-mode imaging, spectral Doppler, color Doppler, and power Doppler as needed of all accessible portions of each vessel. Bilateral testing is considered an integral part of Valeria Krisko complete examination. Limited examinations for reoccurring indications may be performed as noted.  Left Findings: +----------+------------+---------+-----------+----------+-------------------+ LEFT      CompressiblePhasicitySpontaneousProperties      Summary       +----------+------------+---------+-----------+----------+-------------------+ IJV           Full       Yes       Yes                                  +----------+------------+---------+-----------+----------+-------------------+ Subclavian    Full       Yes       Yes                                  +----------+------------+---------+-----------+----------+-------------------+ Axillary                                            Unable to visualize  +----------+------------+---------+-----------+----------+-------------------+ Brachial    Partial                                  Age Indeterminate  +----------+------------+---------+-----------+----------+-------------------+ Radial        Full                                                      +----------+------------+---------+-----------+----------+-------------------+ Ulnar         Full                                                      +----------+------------+---------+-----------+----------+-------------------+ Cephalic      Full                                                      +----------+------------+---------+-----------+----------+-------------------+ Basilic       Full                                                      +----------+------------+---------+-----------+----------+-------------------+  Summary:  Left: No evidence of superficial vein thrombosis in the upper extremity. Findings consistent with age indeterminate deep vein thrombosis involving the left brachial veins.  *See table(s) above for measurements and observations.  Diagnosing physician: Ruta Hinds MD Electronically signed by Ruta Hinds MD on 11/26/2019 at 6:25:38 PM.    Final         Scheduled Meds: . Chlorhexidine Gluconate Cloth  6 each Topical Daily  . collagenase  Topical Daily  . furosemide  40 mg Intravenous BID  . mouth rinse  15 mL Mouth Rinse BID  . pantoprazole  40 mg Oral BID AC  . phosphorus  500 mg Oral QID  . polyethylene glycol  17 g Oral Daily  . polyvinyl alcohol  1 drop Both Eyes TID  . senna  2 tablet Oral QHS  . sodium chloride flush  10-40 mL Intracatheter Q12H   Continuous Infusions: . sodium chloride 10 mL/hr at 11/25/19 0628     LOS: 5 days    Time spent: over 30 min    Fayrene Helper, MD [facility]   To contact the attending provider between 7A-7P or the covering provider during after hours 7P-7A, please log into  the web site [email or web address] and access using universal [place] password for that web site. If you do not have the password, please call the hospital operator.  11/27/2019, 4:22 PM

## 2019-11-27 NOTE — Progress Notes (Signed)
  Echocardiogram 2D Echocardiogram has been performed.  Bobbye Charleston 11/27/2019, 12:04 PM

## 2019-11-27 NOTE — Progress Notes (Signed)
Bilateral lower extremity venous duplex has been completed. Preliminary results can be found in CV Proc through chart review.   11/27/19 9:57 AM Carlos Levering RVT

## 2019-11-27 NOTE — Consult Note (Addendum)
[facility] Nurse Consult Note: Reason for Consult: Consult requested for bilat buttocks; pt has pressure injuries which were noted as present on admission on the nursing flowsheet.  It is difficult to keep wounds from becoming soiled since she is frequently incontinent of liquid stools and this becomes trapped underneath the dressings related to the close location to the rectum. Wound type: Left upper buttock with unstageable wound; 4X3cm, 100% slough/eschar, no odor, drainage, or fluctuance Right upper buttock with unstageable wound; 3X4cm, 100% slough/eschar, no odor, drainage, or fluctuance Right lower buttock with stage 3 pressure injury; 2X2X.2cm, 100% red and moist, small amt tan drainage. No odor Pressure Injury POA: Yes Dressing procedure/placement/frequency:  Topical treatment orders provided for bedside nurses to perform as follows to assist with enzymatic debridement: Apply Santyl to left and right buttock wounds, then cover with moist 2X2 gauze and foam dressing (the upper black and yellow ones, red lower wound only needs a foam) Change foam dressing Q 3 days or PRN soiling.  Please re-consult if further assistance is needed.  Thank-you,  Julien Girt MSN, [place], [place], [place], [place]

## 2019-11-28 DIAGNOSIS — D500 Iron deficiency anemia secondary to blood loss (chronic): Secondary | ICD-10-CM

## 2019-11-28 LAB — COMPREHENSIVE METABOLIC PANEL
ALT: 54 U/L — ABNORMAL HIGH (ref 0–44)
AST: 21 U/L (ref 15–41)
Albumin: 2.6 g/dL — ABNORMAL LOW (ref 3.5–5.0)
Alkaline Phosphatase: 66 U/L (ref 38–126)
Anion gap: 10 (ref 5–15)
BUN: 12 mg/dL (ref 8–23)
CO2: 25 mmol/L (ref 22–32)
Calcium: 7.4 mg/dL — ABNORMAL LOW (ref 8.9–10.3)
Chloride: 105 mmol/L (ref 98–111)
Creatinine, Ser: 0.92 mg/dL (ref 0.44–1.00)
GFR, Estimated: >60 mL/min (ref >60)
Glucose, Bld: 85 mg/dL (ref 70–99)
Potassium: 3 mmol/L — ABNORMAL LOW (ref 3.5–5.1)
Sodium: 140 mmol/L (ref 135–145)
Total Bilirubin: 0.7 mg/dL (ref 0.3–1.2)
Total Protein: 5.4 g/dL — ABNORMAL LOW (ref 6.5–8.1)

## 2019-11-28 LAB — CBC WITH DIFFERENTIAL/PLATELET
Abs Immature Granulocytes: 0.16 10*3/uL — ABNORMAL HIGH (ref 0.00–0.07)
Basophils Absolute: 0 10*3/uL (ref 0.0–0.1)
Basophils Relative: 0 %
Eosinophils Absolute: 0.1 10*3/uL (ref 0.0–0.5)
Eosinophils Relative: 1 %
HCT: 31.3 % — ABNORMAL LOW (ref 36.0–46.0)
Hemoglobin: 9.8 g/dL — ABNORMAL LOW (ref 12.0–15.0)
Immature Granulocytes: 2 %
Lymphocytes Relative: 30 %
Lymphs Abs: 3.1 10*3/uL (ref 0.7–4.0)
MCH: 29.8 pg (ref 26.0–34.0)
MCHC: 31.3 g/dL (ref 30.0–36.0)
MCV: 95.1 fL (ref 80.0–100.0)
Monocytes Absolute: 1.4 10*3/uL — ABNORMAL HIGH (ref 0.1–1.0)
Monocytes Relative: 13 %
Neutro Abs: 5.7 10*3/uL (ref 1.7–7.7)
Neutrophils Relative %: 54 %
Platelets: 230 10*3/uL (ref 150–400)
RBC: 3.29 MIL/uL — ABNORMAL LOW (ref 3.87–5.11)
RDW: 17.7 % — ABNORMAL HIGH (ref 11.5–15.5)
WBC: 10.5 10*3/uL (ref 4.0–10.5)
nRBC: 0 % (ref 0.0–0.2)

## 2019-11-28 LAB — GLUCOSE, CAPILLARY
Glucose-Capillary: 79 mg/dL (ref 70–99)
Glucose-Capillary: 98 mg/dL (ref 70–99)
Glucose-Capillary: 81 mg/dL (ref 70–99)
Glucose-Capillary: 91 mg/dL (ref 70–99)

## 2019-11-28 LAB — MAGNESIUM: Magnesium: 1.8 mg/dL (ref 1.7–2.4)

## 2019-11-28 LAB — PHOSPHORUS: Phosphorus: 3 mg/dL (ref 2.5–4.6)

## 2019-11-28 MED ORDER — POTASSIUM CHLORIDE CRYS ER 20 MEQ PO TBCR: 40.0000 meq | EXTENDED_RELEASE_TABLET | Freq: Two times a day (BID) | ORAL | Status: DC

## 2019-11-28 MED ORDER — POTASSIUM CHLORIDE CRYS ER 20 MEQ PO TBCR
40.0000 meq | EXTENDED_RELEASE_TABLET | Freq: Two times a day (BID) | ORAL | Status: AC
  Administered 2019-11-28 – 2019-11-29 (×3): 40 meq via ORAL
  Filled 2019-11-28 (×4): qty 2

## 2019-11-28 MED ORDER — ACETAMINOPHEN 325 MG PO TABS
650.0000 mg | ORAL_TABLET | Freq: Four times a day (QID) | ORAL | Status: DC | PRN
  Administered 2019-11-28 – 2019-11-30 (×3): 650 mg via ORAL
  Filled 2019-11-28 (×3): qty 2
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
## 2019-11-28 NOTE — Progress Notes (Signed)
PT Cancellation Note  Patient Details Name: Ana Newman MRN: [id number] DOB: [birth year]   Cancelled Treatment:    Reason Eval/Treat Not Completed: PT screened, no needs identified, will sign off . Order received. Chart reviewed. Pt is total care at baseline. She is not appropriate for acute PT services. She is a LTC SNF resident. Recommend return to SNF at d/c. Will sign off.    [facility]  Office: [phone] Pager: [phone]

## 2019-11-29 DIAGNOSIS — L89159 Pressure ulcer of sacral region, unspecified stage: Secondary | ICD-10-CM

## 2019-11-29 LAB — BASIC METABOLIC PANEL
Anion gap: 9 (ref 5–15)
BUN: 10 mg/dL (ref 8–23)
CO2: 25 mmol/L (ref 22–32)
Calcium: 7.6 mg/dL — ABNORMAL LOW (ref 8.9–10.3)
Chloride: 103 mmol/L (ref 98–111)
Creatinine, Ser: 0.9 mg/dL (ref 0.44–1.00)
GFR, Estimated: >60 mL/min (ref >60)
Glucose, Bld: 84 mg/dL (ref 70–99)
Potassium: 4 mmol/L (ref 3.5–5.1)
Sodium: 137 mmol/L (ref 135–145)

## 2019-11-29 LAB — GLUCOSE, CAPILLARY
Glucose-Capillary: 77 mg/dL (ref 70–99)
Glucose-Capillary: 89 mg/dL (ref 70–99)
Glucose-Capillary: 74 mg/dL (ref 70–99)
Glucose-Capillary: 80 mg/dL (ref 70–99)

## 2019-11-29 LAB — BRAIN NATRIURETIC PEPTIDE: B Natriuretic Peptide: 172.2 pg/mL — ABNORMAL HIGH (ref 0.0–100.0)

## 2019-11-29 MED ORDER — LOSARTAN POTASSIUM 25 MG PO TABS
12.5000 mg | ORAL_TABLET | Freq: Every day | ORAL | Status: DC
  Administered 2019-11-29: 12.5 mg via ORAL
  Filled 2019-11-29: qty 1

## 2019-11-29 MED ORDER — FUROSEMIDE 40 MG PO TABS
40.0000 mg | ORAL_TABLET | Freq: Every day | ORAL | Status: DC
  Administered 2019-11-29: 40 mg via ORAL
  Filled 2019-11-29: qty 1
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
## 2019-11-29 NOTE — Consult Note (Signed)
Reason for Consult: Decompensated congestive heart failure secondary to preserved systolic function Referring Physician: [facility] hospitalist  Ana Newman is an 84 y.o. female.  HPI: Patient is 84 year old female with past medical history significant for multiple medical problems i.e. hypertension, diabetes mellitus, hyperlipidemia, history of pyelonephritis in the past, mild vascular dementia, bilateral foot drop, nursing home resident mostly bedbound was admitted on 11/22/19 because of epigastric pain associated with hematemesis, patient was noted to have acute renal injury associated with lactic acidosis and was noted to be hypotensive received 3 L of IV fluid in the ED and was treated for sepsis syndrome.  Cardiology consultation is called today because patient was noted to have elevated BNP and requiring IV Lasix now.  2D echo done showed good LV systolic function with grade 1 diastolic dysfunction and mild valvular disease.  Patient denies any complaints although no meaningful detailed history could be obtained.  States overall feels better.  Past Medical History:  Diagnosis Date  . Acute pyelonephritis   . Alzheimer's disease ([place])    alzheimer's dementia   . Bilateral foot-drop   . Bladder outlet obstruction   . Cholelithiasis   . Constipation   . Depression   . Diabetes mellitus without complication ([place])    type 2   . Diabetic ulcer of right buttock ([place])   . Diarrhea   . Dysrhythmia    ? hx of afib postop 1/31-03/06/18   . Environmental and seasonal allergies   . Essential hypertension, benign   . Foley catheter in place   . GERD (gastroesophageal reflux disease)   . History of kidney stones   . History of urinary tract infection   . Hyperlipidemia   . Hypertension   . Hypothyroidism   . Incontinence    wears diaper   . Insomnia   . Nephrolithiasis   . Osteoarthritis   . Pyelonephritis   . Reflux esophagitis   . Rickets, active   . Sepsis ([place])   . Sepsis ([place])     hx of   . Unspecified constipation   . Unspecified psychosis   . Unspecified vitamin D deficiency   . Vitamin D deficiency     Past Surgical History:  Procedure Laterality Date  . ABDOMINAL HYSTERECTOMY    . CYSTOSCOPY W/ URETERAL STENT PLACEMENT Bilateral 05/18/2015   Procedure: CYSTOSCOPY WITH RETROGRADE PYELOGRAM/URETERAL STENT PLACEMENT;  Surgeon: Carolan Clines, MD;  Location: [facility];  Service: Urology;  Laterality: Bilateral;  . CYSTOSCOPY W/ URETERAL STENT PLACEMENT Bilateral 03/03/2018   Procedure: CYSTOSCOPY WITH RETROGRADE PYELOGRAM/URETERAL STENT PLACEMENT;  Surgeon: Franchot Gallo, MD;  Location: [facility];  Service: Urology;  Laterality: Bilateral;  . CYSTOSCOPY W/ URETERAL STENT REMOVAL Bilateral 09/12/2015   Procedure: CYSTOSCOPY WITH STENT REMOVAL;  Surgeon: Carolan Clines, MD;  Location: [facility];  Service: Urology;  Laterality: Bilateral;  1 HOUR  . CYSTOSCOPY WITH RETROGRADE PYELOGRAM, URETEROSCOPY AND STENT PLACEMENT Bilateral 07/18/2015   Procedure: CYSTOSCOPY REMOVE LEFT DOUBLE J STENT LEFT RETROGRADE PYELOGRAM, LEFT URETEROSCOPY, PYELOSCOPY, REPLACEMENT OF LEFT DOUBLE J STENT;  Surgeon: Carolan Clines, MD;  Location: [facility];  Service: Urology;  Laterality: Bilateral;  . CYSTOSCOPY WITH RETROGRADE PYELOGRAM, URETEROSCOPY AND STENT PLACEMENT Bilateral 06/08/2018   Procedure: CYSTOSCOPY WITH RETROGRADE PYELOGRAM, URETEROSCOPY AND STENT PLACEMENT;  Surgeon: Franchot Gallo, MD;  Location: [facility];  Service: Urology;  Laterality: Bilateral;  90 MINS  . HOLMIUM LASER APPLICATION Bilateral 8/46/9629   Procedure: HOLMIUM LASER OF LEFT LOWER URETERAL STONE, LEFT PYELOSCOPY, REPLACE LEFT  DOUBLE J STENT REMOVE RIGHT DOUBLE J STENT RIGHT RETROGRADE PYELOGRAM, REPLACEMENT RIGHT DOUBLE J STENT ;  Surgeon: Carolan Clines, MD;  Location: [facility];  Service: Urology;  Laterality: Bilateral;  . HOLMIUM LASER APPLICATION Bilateral 03/07/4008   Procedure: HOLMIUM LASER APPLICATION;   Surgeon: Franchot Gallo, MD;  Location: [facility];  Service: Urology;  Laterality: Bilateral;  . KNEE ARTHROSCOPY Left 1997    Family History  Family history unknown: Yes    Social History:  reports that she has never smoked. She has never used smokeless tobacco. She reports that she does not drink alcohol and does not use drugs.  Allergies:  Allergies  Allergen Reactions  . 5-Alpha Reductase Inhibitors     Medications: I have reviewed the patient's current medications.  Results for orders placed or performed during the hospital encounter of 11/22/19 (from the past 48 hour(s))  Glucose, capillary     Status: None   Collection Time: 11/27/19  9:01 PM  Result Value Ref Range   Glucose-Capillary 86 70 - 99 mg/dL    Comment: Glucose reference range applies only to samples taken after fasting for at least 8 hours.   Comment 1 Notify RN    Comment 2 Document in Chart   CBC with Differential/Platelet     Status: Abnormal   Collection Time: 11/28/19  3:34 AM  Result Value Ref Range   WBC 10.5 4.0 - 10.5 K/uL   RBC 3.29 (L) 3.87 - 5.11 MIL/uL   Hemoglobin 9.8 (L) 12.0 - 15.0 g/dL   HCT 31.3 (L) 36 - 46 %   MCV 95.1 80.0 - 100.0 fL   MCH 29.8 26.0 - 34.0 pg   MCHC 31.3 30.0 - 36.0 g/dL   RDW 17.7 (H) 11.5 - 15.5 %   Platelets 230 150 - 400 K/uL   nRBC 0.0 0.0 - 0.2 %   Neutrophils Relative % 54 %   Neutro Abs 5.7 1.7 - 7.7 K/uL   Lymphocytes Relative 30 %   Lymphs Abs 3.1 0.7 - 4.0 K/uL   Monocytes Relative 13 %   Monocytes Absolute 1.4 (H) 0.1 - 1.0 K/uL   Eosinophils Relative 1 %   Eosinophils Absolute 0.1 0.0 - 0.5 K/uL   Basophils Relative 0 %   Basophils Absolute 0.0 0.0 - 0.1 K/uL   Immature Granulocytes 2 %   Abs Immature Granulocytes 0.16 (H) 0.00 - 0.07 K/uL    Comment: Performed at [facility], [place] [address] [place], [place] [address]  Comprehensive metabolic panel     Status: Abnormal   Collection Time: 11/28/19  3:34 AM  Result Value Ref  Range   Sodium 140 135 - 145 mmol/L   Potassium 3.0 (L) 3.5 - 5.1 mmol/L   Chloride 105 98 - 111 mmol/L   CO2 25 22 - 32 mmol/L   Glucose, Bld 85 70 - 99 mg/dL    Comment: Glucose reference range applies only to samples taken after fasting for at least 8 hours.   BUN 12 8 - 23 mg/dL   Creatinine, Ser 0.92 0.44 - 1.00 mg/dL   Calcium 7.4 (L) 8.9 - 10.3 mg/dL   Total Protein 5.4 (L) 6.5 - 8.1 g/dL   Albumin 2.6 (L) 3.5 - 5.0 g/dL   AST 21 15 - 41 U/L   ALT 54 (H) 0 - 44 U/L   Alkaline Phosphatase 66 38 - 126 U/L   Total Bilirubin 0.7 0.3 - 1.2 mg/dL   GFR,  Estimated >60 >60 mL/min    Comment: (NOTE) Calculated using the CKD-EPI Creatinine Equation (2021)    Anion gap 10 5 - 15    Comment: Performed at [facility], [place] [address], [place], [place] [address]  Magnesium     Status: None   Collection Time: 11/28/19  3:34 AM  Result Value Ref Range   Magnesium 1.8 1.7 - 2.4 mg/dL    Comment: Performed at [facility], [place] [address] [place], [place] [address]  Phosphorus     Status: None   Collection Time: 11/28/19  3:34 AM  Result Value Ref Range   Phosphorus 3.0 2.5 - 4.6 mg/dL    Comment: Performed at [facility], [place] [address] [place], [place] [address]  Glucose, capillary     Status: None   Collection Time: 11/28/19  7:44 AM  Result Value Ref Range   Glucose-Capillary 79 70 - 99 mg/dL    Comment: Glucose reference range applies only to samples taken after fasting for at least 8 hours.   Comment 1 Notify RN    Comment 2 Document in Chart   Glucose, capillary     Status: None   Collection Time: 11/28/19 11:37 AM  Result Value Ref Range   Glucose-Capillary 98 70 - 99 mg/dL    Comment: Glucose reference range applies only to samples taken after fasting for at least 8 hours.   Comment 1 Notify RN    Comment 2 Document in Chart   Glucose, capillary     Status: None   Collection Time: 11/28/19  4:45 PM  Result Value  Ref Range   Glucose-Capillary 81 70 - 99 mg/dL    Comment: Glucose reference range applies only to samples taken after fasting for at least 8 hours.   Comment 1 Notify RN    Comment 2 Document in Chart   Glucose, capillary     Status: None   Collection Time: 11/28/19  9:22 PM  Result Value Ref Range   Glucose-Capillary 91 70 - 99 mg/dL    Comment: Glucose reference range applies only to samples taken after fasting for at least 8 hours.  Basic metabolic panel     Status: Abnormal   Collection Time: 11/29/19  7:03 AM  Result Value Ref Range   Sodium 137 135 - 145 mmol/L   Potassium 4.0 3.5 - 5.1 mmol/L    Comment: DELTA CHECK NOTED NO VISIBLE HEMOLYSIS    Chloride 103 98 - 111 mmol/L   CO2 25 22 - 32 mmol/L   Glucose, Bld 84 70 - 99 mg/dL    Comment: Glucose reference range applies only to samples taken after fasting for at least 8 hours.   BUN 10 8 - 23 mg/dL   Creatinine, Ser 0.90 0.44 - 1.00 mg/dL   Calcium 7.6 (L) 8.9 - 10.3 mg/dL   GFR, Estimated >60 >60 mL/min    Comment: (NOTE) Calculated using the CKD-EPI Creatinine Equation (2021)    Anion gap 9 5 - 15    Comment: Performed at [facility], [place] [address] [place], [place] [address]  Brain natriuretic peptide     Status: Abnormal   Collection Time: 11/29/19  7:03 AM  Result Value Ref Range   B Natriuretic Peptide 172.2 (H) 0.0 - 100.0 pg/mL    Comment: Performed at [facility], [place] [address] [place], [place] [address]  Glucose, capillary     Status: None  Collection Time: 11/29/19  7:32 AM  Result Value Ref Range   Glucose-Capillary 77 70 - 99 mg/dL    Comment: Glucose reference range applies only to samples taken after fasting for at least 8 hours.   Comment 1 Notify RN    Comment 2 Document in Chart   Glucose, capillary     Status: None   Collection Time: 11/29/19 11:41 AM  Result Value Ref Range   Glucose-Capillary 89 70 - 99 mg/dL    Comment: Glucose reference range  applies only to samples taken after fasting for at least 8 hours.   Comment 1 Notify RN    Comment 2 Document in Chart   Glucose, capillary     Status: None   Collection Time: 11/29/19  4:44 PM  Result Value Ref Range   Glucose-Capillary 74 70 - 99 mg/dL    Comment: Glucose reference range applies only to samples taken after fasting for at least 8 hours.   Comment 1 Notify RN    Comment 2 Document in Chart     No results found.  Review of Systems  Unable to perform ROS: Dementia   Blood pressure 132/66, pulse 88, temperature 98.6 F (37 C), temperature source Oral, resp. rate 20, height 5\' 1"  (1.549 m), weight 63.5 kg, SpO2 100 %. Physical Exam HENT:     Head: Normocephalic and atraumatic.  Eyes:     General: No scleral icterus.    Pupils: Pupils are equal, round, and reactive to light.  Cardiovascular:     Rate and Rhythm: Normal rate and regular rhythm.     Heart sounds: Murmur (2/6 systolic murmur noted) heard.   Pulmonary:     Effort: Pulmonary effort is normal.     Comments: Decreased breath sound at bases Abdominal:     General: Abdomen is flat. Bowel sounds are normal. There is no distension.     Palpations: Abdomen is soft.     Tenderness: There is no abdominal tenderness.  Feet:     Comments: No clubbing cyanosis or edema noted bilateral foot drop noted with pressure sores. Neurological:     General: No focal deficit present.     Assessment/Plan: Resolving decompensated congestive heart failure secondary to preserved systolic function/status post volume overload Hypertension Diabetes mellitus Status post sepsis syndrome Vascular dementia Status post acute kidney injury Status post upper GI bleed Status post acute blood loss anemia DVT of left brachial vein Multiple pressure sores of the leg. Plan Start low-dose losartan as per orders DC IV Lasix Start Lasix 40 mg p.o. daily We will add low-dose beta-blockers as blood pressure tolerates and when fully  compensated Charolette Forward 11/29/2019, 5:19 PM

## 2019-11-29 NOTE — Care Management Important Message (Signed)
Important Message  Patient Details IM Letter given to the Patient Name: Ana Newman MRN: [id number] Date of Birth: [birth year]   Medicare Important Message Given:  Yes     Kerin Salen 11/29/2019, 10:24 AM
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
## 2019-11-30 LAB — GLUCOSE, CAPILLARY
Glucose-Capillary: 81 mg/dL (ref 70–99)
Glucose-Capillary: 134 mg/dL — ABNORMAL HIGH (ref 70–99)
Glucose-Capillary: 195 mg/dL — ABNORMAL HIGH (ref 70–99)
Glucose-Capillary: 154 mg/dL — ABNORMAL HIGH (ref 70–99)

## 2019-11-30 MED ORDER — FUROSEMIDE 20 MG PO TABS
20.0000 mg | ORAL_TABLET | Freq: Every day | ORAL | Status: DC
  Administered 2019-11-30 – 2019-12-01 (×2): 20 mg via ORAL
  Filled 2019-11-30 (×2): qty 1

## 2019-11-30 MED ORDER — LOSARTAN POTASSIUM 25 MG PO TABS
25.0000 mg | ORAL_TABLET | Freq: Every day | ORAL | Status: DC
  Administered 2019-11-30 – 2019-12-01 (×2): 25 mg via ORAL
  Filled 2019-11-30 (×2): qty 1

## 2019-11-30 MED ORDER — TRAMADOL HCL 50 MG PO TABS
50.0000 mg | ORAL_TABLET | Freq: Four times a day (QID) | ORAL | Status: DC | PRN
  Administered 2019-11-30 – 2019-12-01 (×2): 50 mg via ORAL
  Filled 2019-11-30 (×2): qty 1

## 2019-11-30 MED ORDER — PROSOURCE PLUS PO LIQD
30.0000 mL | Freq: Two times a day (BID) | ORAL | Status: DC
  Administered 2019-11-30 (×2): 30 mL via ORAL
  Filled 2019-11-30 (×3): qty 30
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
## 2019-11-30 NOTE — Progress Notes (Signed)
Subjective:  Patient more alert, awake, eating breakfast.states her appetite coming back.  Denies any chest pain or shortness of breath  Objective:  Vital Signs in the last 24 hours: Temp:  [98.6 F (37 C)-99.1 F (37.3 C)] 98.7 F (37.1 C) (10/29 0513) Pulse Rate:  [88-100] 91 (10/29 0513) Resp:  [20-21] 20 (10/29 0513) BP: (132-148)/(51-66) 134/51 (10/29 0513) SpO2:  [96 %-100 %] 99 % (10/29 0513) Weight:  [60.2 kg] 60.2 kg (10/29 0500)  Intake/Output from previous day: 10/28 0701 - 10/29 0700 In: 300 [P.O.:300] Out: 2300 [Urine:2250; Emesis/NG output:50] Intake/Output from this shift: No intake/output data recorded.  Physical Exam: Neck: no adenopathy, no carotid bruit, no JVD, supple, symmetrical, trachea midline and thyroid not enlarged, symmetric, no tenderness/mass/nodules Lungs: clear to auscultation bilaterally Abdomen: soft, non-tender; bowel sounds normal; no masses,  no organomegaly Extremities: extremities normal, atraumatic, no cyanosis or edema and bilateral foot drop with wrapped up stasis ulcers noted  Lab Results: Recent Labs    11/28/19 0334  WBC 10.5  HGB 9.8*  PLT 230   Recent Labs    11/28/19 0334 11/29/19 0703  NA 140 137  K 3.0* 4.0  CL 105 103  CO2 25 25  GLUCOSE 85 84  BUN 12 10  CREATININE 0.92 0.90   No results for input(s): TROPONINI in the last 72 hours.  Invalid input(s): CK, MB Hepatic Function Panel Recent Labs    11/28/19 0334  PROT 5.4*  ALBUMIN 2.6*  AST 21  ALT 54*  ALKPHOS 66  BILITOT 0.7   No results for input(s): CHOL in the last 72 hours. No results for input(s): PROTIME in the last 72 hours.  Imaging: Imaging results have been reviewed and No results found.  Cardiac Studies:  Assessment/Plan:  Compensated congestive heart failure secondary to preserved systolic function/status post volume overload Hypertension Diabetes mellitus Status post sepsis syndrome Vascular dementia Status post acute kidney  injury Status post upper GI bleed Status post acute blood loss anemia DVT of left brachial vein Multiple pressure sores of the leg. Plan Increase losartan to 25 mg daily  Reduce Lasix to 20 mg by mouth daily Okay to discharge from cardiac point of view. I will sign off.  Please call if needed.  Thank you  LOS: 8 days    Charolette Forward 11/30/2019, 8:24 AM

## 2019-12-01 DIAGNOSIS — K2970 Gastritis, unspecified, without bleeding: Secondary | ICD-10-CM | POA: Diagnosis present

## 2019-12-01 DIAGNOSIS — E559 Vitamin D deficiency, unspecified: Secondary | ICD-10-CM | POA: Diagnosis present

## 2019-12-01 DIAGNOSIS — R58 Hemorrhage, not elsewhere classified: Secondary | ICD-10-CM | POA: Diagnosis not present

## 2019-12-01 DIAGNOSIS — E1159 Type 2 diabetes mellitus with other circulatory complications: Secondary | ICD-10-CM | POA: Diagnosis present

## 2019-12-01 DIAGNOSIS — K625 Hemorrhage of anus and rectum: Secondary | ICD-10-CM | POA: Diagnosis not present

## 2019-12-01 DIAGNOSIS — Z90710 Acquired absence of both cervix and uterus: Secondary | ICD-10-CM | POA: Diagnosis not present

## 2019-12-01 DIAGNOSIS — Z79899 Other long term (current) drug therapy: Secondary | ICD-10-CM | POA: Diagnosis not present

## 2019-12-01 DIAGNOSIS — N200 Calculus of kidney: Secondary | ICD-10-CM | POA: Diagnosis present

## 2019-12-01 DIAGNOSIS — M1991 Primary osteoarthritis, unspecified site: Secondary | ICD-10-CM | POA: Diagnosis present

## 2019-12-01 DIAGNOSIS — D500 Iron deficiency anemia secondary to blood loss (chronic): Secondary | ICD-10-CM | POA: Diagnosis not present

## 2019-12-01 DIAGNOSIS — Z7401 Bed confinement status: Secondary | ICD-10-CM | POA: Diagnosis not present

## 2019-12-01 DIAGNOSIS — N319 Neuromuscular dysfunction of bladder, unspecified: Secondary | ICD-10-CM | POA: Diagnosis present

## 2019-12-01 DIAGNOSIS — K219 Gastro-esophageal reflux disease without esophagitis: Secondary | ICD-10-CM | POA: Diagnosis present

## 2019-12-01 DIAGNOSIS — K449 Diaphragmatic hernia without obstruction or gangrene: Secondary | ICD-10-CM | POA: Diagnosis present

## 2019-12-01 DIAGNOSIS — E861 Hypovolemia: Secondary | ICD-10-CM | POA: Diagnosis not present

## 2019-12-01 DIAGNOSIS — Z20822 Contact with and (suspected) exposure to covid-19: Secondary | ICD-10-CM | POA: Diagnosis present

## 2019-12-01 DIAGNOSIS — E785 Hyperlipidemia, unspecified: Secondary | ICD-10-CM | POA: Diagnosis present

## 2019-12-01 DIAGNOSIS — Z87442 Personal history of urinary calculi: Secondary | ICD-10-CM | POA: Diagnosis not present

## 2019-12-01 DIAGNOSIS — K5904 Chronic idiopathic constipation: Secondary | ICD-10-CM | POA: Diagnosis present

## 2019-12-01 DIAGNOSIS — D62 Acute posthemorrhagic anemia: Secondary | ICD-10-CM | POA: Diagnosis present

## 2019-12-01 DIAGNOSIS — E1169 Type 2 diabetes mellitus with other specified complication: Secondary | ICD-10-CM | POA: Diagnosis present

## 2019-12-01 DIAGNOSIS — Z86718 Personal history of other venous thrombosis and embolism: Secondary | ICD-10-CM | POA: Diagnosis not present

## 2019-12-01 DIAGNOSIS — K921 Melena: Secondary | ICD-10-CM | POA: Diagnosis not present

## 2019-12-01 DIAGNOSIS — E039 Hypothyroidism, unspecified: Secondary | ICD-10-CM | POA: Diagnosis present

## 2019-12-01 DIAGNOSIS — D122 Benign neoplasm of ascending colon: Secondary | ICD-10-CM | POA: Diagnosis present

## 2019-12-01 DIAGNOSIS — F99 Mental disorder, not otherwise specified: Secondary | ICD-10-CM | POA: Diagnosis not present

## 2019-12-01 DIAGNOSIS — R4182 Altered mental status, unspecified: Secondary | ICD-10-CM | POA: Diagnosis not present

## 2019-12-01 DIAGNOSIS — G309 Alzheimer's disease, unspecified: Secondary | ICD-10-CM | POA: Diagnosis present

## 2019-12-01 DIAGNOSIS — F333 Major depressive disorder, recurrent, severe with psychotic symptoms: Secondary | ICD-10-CM | POA: Diagnosis not present

## 2019-12-01 DIAGNOSIS — L89313 Pressure ulcer of right buttock, stage 3: Secondary | ICD-10-CM | POA: Diagnosis present

## 2019-12-01 DIAGNOSIS — K922 Gastrointestinal hemorrhage, unspecified: Secondary | ICD-10-CM | POA: Diagnosis not present

## 2019-12-01 DIAGNOSIS — R32 Unspecified urinary incontinence: Secondary | ICD-10-CM | POA: Diagnosis present

## 2019-12-01 DIAGNOSIS — D649 Anemia, unspecified: Secondary | ICD-10-CM | POA: Diagnosis present

## 2019-12-01 DIAGNOSIS — K626 Ulcer of anus and rectum: Secondary | ICD-10-CM | POA: Diagnosis present

## 2019-12-01 DIAGNOSIS — I152 Hypertension secondary to endocrine disorders: Secondary | ICD-10-CM | POA: Diagnosis present

## 2019-12-01 DIAGNOSIS — Z7901 Long term (current) use of anticoagulants: Secondary | ICD-10-CM | POA: Diagnosis not present

## 2019-12-01 DIAGNOSIS — R404 Transient alteration of awareness: Secondary | ICD-10-CM | POA: Diagnosis not present

## 2019-12-01 DIAGNOSIS — Z87440 Personal history of urinary (tract) infections: Secondary | ICD-10-CM | POA: Diagnosis not present

## 2019-12-01 DIAGNOSIS — L89150 Pressure ulcer of sacral region, unstageable: Secondary | ICD-10-CM | POA: Diagnosis not present

## 2019-12-01 DIAGNOSIS — Z960 Presence of urogenital implants: Secondary | ICD-10-CM | POA: Diagnosis present

## 2019-12-01 DIAGNOSIS — M2550 Pain in unspecified joint: Secondary | ICD-10-CM | POA: Diagnosis not present

## 2019-12-01 DIAGNOSIS — R5381 Other malaise: Secondary | ICD-10-CM | POA: Diagnosis present

## 2019-12-01 DIAGNOSIS — L89159 Pressure ulcer of sacral region, unspecified stage: Secondary | ICD-10-CM | POA: Diagnosis not present

## 2019-12-01 DIAGNOSIS — I9589 Other hypotension: Secondary | ICD-10-CM | POA: Diagnosis not present

## 2019-12-01 DIAGNOSIS — F0280 Dementia in other diseases classified elsewhere without behavioral disturbance: Secondary | ICD-10-CM | POA: Diagnosis present

## 2019-12-01 DIAGNOSIS — I10 Essential (primary) hypertension: Secondary | ICD-10-CM | POA: Diagnosis present

## 2019-12-01 LAB — GLUCOSE, CAPILLARY
Glucose-Capillary: 130 mg/dL — ABNORMAL HIGH (ref 70–99)
Glucose-Capillary: 100 mg/dL — ABNORMAL HIGH (ref 70–99)

## 2019-12-01 LAB — BASIC METABOLIC PANEL
Anion gap: 11 (ref 5–15)
BUN: 15 mg/dL (ref 8–23)
CO2: 22 mmol/L (ref 22–32)
Calcium: 8 mg/dL — ABNORMAL LOW (ref 8.9–10.3)
Chloride: 99 mmol/L (ref 98–111)
Creatinine, Ser: 1.02 mg/dL — ABNORMAL HIGH (ref 0.44–1.00)
GFR, Estimated: 55 mL/min — ABNORMAL LOW (ref >60)
Glucose, Bld: 101 mg/dL — ABNORMAL HIGH (ref 70–99)
Potassium: 4.1 mmol/L (ref 3.5–5.1)
Sodium: 132 mmol/L — ABNORMAL LOW (ref 135–145)

## 2019-12-01 LAB — HEMOGLOBIN AND HEMATOCRIT, BLOOD
HCT: 35.1 % — ABNORMAL LOW (ref 36.0–46.0)
Hemoglobin: 10.6 g/dL — ABNORMAL LOW (ref 12.0–15.0)

## 2019-12-01 LAB — RESPIRATORY PANEL BY RT PCR (FLU A&B, COVID)
Influenza A by PCR: NEGATIVE
Influenza B by PCR: NEGATIVE
SARS Coronavirus 2 by RT PCR: NEGATIVE

## 2019-12-01 MED ORDER — APIXABAN 5 MG PO TABS
10.0000 mg | ORAL_TABLET | Freq: Two times a day (BID) | ORAL | Status: DC
  Administered 2019-12-01: 10 mg via ORAL
  Filled 2019-12-01: qty 2

## 2019-12-01 MED ORDER — FUROSEMIDE 20 MG PO TABS
20.0000 mg | ORAL_TABLET | Freq: Every day | ORAL | 0 refills | Status: DC
Start: 2019-12-02 — End: 2019-12-10

## 2019-12-01 MED ORDER — COLLAGENASE 250 UNIT/GM EX OINT
TOPICAL_OINTMENT | Freq: Every day | CUTANEOUS | 0 refills | Status: DC
Start: 2019-12-02 — End: 2020-01-12

## 2019-12-01 MED ORDER — TRAMADOL HCL 50 MG PO TABS: 50.0000 mg | ORAL_TABLET | Freq: Four times a day (QID) | ORAL | 0 refills | Status: DC | PRN

## 2019-12-01 MED ORDER — APIXABAN (ELIQUIS) VTE STARTER PACK (10MG AND 5MG): ORAL_TABLET | ORAL | 0 refills | Status: DC

## 2019-12-01 MED ORDER — PANTOPRAZOLE SODIUM 40 MG PO TBEC: 40.0000 mg | DELAYED_RELEASE_TABLET | Freq: Two times a day (BID) | ORAL | 1 refills | Status: DC

## 2019-12-01 MED ORDER — LOSARTAN POTASSIUM 25 MG PO TABS
25.0000 mg | ORAL_TABLET | Freq: Every day | ORAL | 0 refills | Status: DC
Start: 2019-12-02 — End: 2019-12-10

## 2019-12-01 MED ORDER — APIXABAN 5 MG PO TABS: 5.0000 mg | ORAL_TABLET | Freq: Two times a day (BID) | ORAL | Status: DC

## 2019-12-01 NOTE — TOC Transition Note (Addendum)
Transition of Care [facility]) - CM/SW Discharge Note   Patient Details  Name: Ana Newman MRN: [id number] Date of Birth: March 06, 1935  Transition of Care [facility]) CM/SW Contact:  Ross Ludwig, LCSW Phone Number: 12/01/2019, 1:01 PM   Clinical Narrative:     10:43am  CSW Attempted to speak to someone at [facility], left a message on voice mail. 12:30pm  CSW attempted to speak to someone again at [facility], Network engineer at [employer] took message and said nurse is on lunch break and will call back. 1:00pm CSW left another message 1:31pm CSW received phone call from [place] at [facility] and she said her nurse is leaving at [place] informed her that DC summary, FL2, and transfer report were sent via hub and also faxed to [phone] and [phone].  CSW informed [facility] Boston that [facility] worker wrote, patient can return over weekend in handoff.  Per Loma Boston, when she spoke to someone yesterday, the [facility] worker stated it might be Saturday or Sunday. Loma Boston agreed to accept patient back today.   Patient to be d/c'ed today to [facility].  Patient and family agreeable to plans will transport via ems RN to call report to [phone].  CSW left a voice mail on patient's daughter's phone to let her know patient is discharging today.     Final next level of care:  [facility]) Barriers to Discharge: Barriers Resolved   Patient Goals and CMS Choice Patient states their goals for this hospitalization and ongoing recovery are:: To return to SNF where she is a long term care resident. CMS Medicare.gov Compare Post Acute Care list provided to:: Patient Represenative (must comment) Choice offered to / list presented to : Adult Children  Discharge Placement   Existing PASRR number confirmed : 12/01/19          Patient chooses bed at: Other - please specify in the comment section below: [facility] SNF) Patient to be transferred to facility by: [facility] Name of family member notified:  CSW left a message on patient's daughter Christie's voice mail. Patient and family notified of of transfer: 12/01/19  Discharge Plan and Services                  DME Agency: NA       HH Arranged: NA          Social Determinants of Health (SDOH) Interventions     Readmission Risk Interventions No flowsheet data found.

## 2019-12-01 NOTE — Progress Notes (Signed)
Attempted to call report to [facility]. Put on hold for greater than 3 minutes. Will try again later.

## 2019-12-01 NOTE — Plan of Care (Signed)
Pt d/c to [facility].

## 2019-12-01 NOTE — Progress Notes (Signed)
Pt leaving with [facility] at this time. Headed to [place], [place].

## 2019-12-01 NOTE — Discharge Summary (Signed)
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 hip osteoarthritis. L2-S1 fusion. IMPRESSION: 1. Multiple bilateral renal calculi. Stones are seen at the bilateral renal pelvis but no associated hydronephrosis. 2. Distended gallbladder with sludge or noncalcified calculi. No superimposed acute inflammation. 3. L1 compression fracture with mild height loss, likely subacute. 4. Bladder diverticulum.  No over distension. Electronically Signed   By: Monte Fantasia M.D.   On: 11/22/2019 07:22   VAS [place] LOWER EXTREMITY VENOUS (DVT)  Result Date: 11/27/2019  Lower Venous DVT Study Indications: Edema.  Risk Factors: Immobility. Limitations: Poor ultrasound/tissue interface and patient positioning, patient immobility. Comparison Study: No prior studies. Performing Technologist: Oliver Hum RVT  Examination Guidelines: A complete evaluation includes B-mode imaging, spectral Doppler, color Doppler, and power Doppler as needed of all accessible portions of each vessel. Bilateral  testing is considered an integral part of a complete examination. Limited examinations for reoccurring indications may be performed as noted. The reflux portion of the exam is performed with the patient in reverse Trendelenburg.  +---------+---------------+---------+-----------+----------+--------------+ RIGHT    CompressibilityPhasicitySpontaneityPropertiesThrombus Aging +---------+---------------+---------+-----------+----------+--------------+ CFV      Full           Yes      Yes                                 +---------+---------------+---------+-----------+----------+--------------+ SFJ      Full                                                        +---------+---------------+---------+-----------+----------+--------------+ FV Prox  Full                                                        +---------+---------------+---------+-----------+----------+--------------+ FV Mid   Full           Yes      Yes                                 +---------+---------------+---------+-----------+----------+--------------+ FV Distal               Yes      Yes                                 +---------+---------------+---------+-----------+----------+--------------+ PFV      Full                                                        +---------+---------------+---------+-----------+----------+--------------+  POP      Full           Yes      Yes                                 +---------+---------------+---------+-----------+----------+--------------+ PTV      Full                                                        +---------+---------------+---------+-----------+----------+--------------+ PERO     Full                                                        +---------+---------------+---------+-----------+----------+--------------+   +---------+---------------+---------+-----------+----------+--------------+ LEFT      CompressibilityPhasicitySpontaneityPropertiesThrombus Aging +---------+---------------+---------+-----------+----------+--------------+ CFV      Full           Yes      Yes                                 +---------+---------------+---------+-----------+----------+--------------+ SFJ      Full                                                        +---------+---------------+---------+-----------+----------+--------------+ FV Prox                 Yes      Yes                                 +---------+---------------+---------+-----------+----------+--------------+ FV Mid                  Yes      Yes                                 +---------+---------------+---------+-----------+----------+--------------+ FV Distal                                             Not visualized +---------+---------------+---------+-----------+----------+--------------+ PFV                                                   Not visualized +---------+---------------+---------+-----------+----------+--------------+ POP                     Yes      Yes                                 +---------+---------------+---------+-----------+----------+--------------+ PTV  Full                                                        +---------+---------------+---------+-----------+----------+--------------+ PERO                                                  Not visualized +---------+---------------+---------+-----------+----------+--------------+     Summary: RIGHT: - There is no evidence of deep vein thrombosis in the lower extremity. However, portions of this examination were limited- see technologist comments above.  - No cystic structure found in the popliteal fossa.  LEFT: - There is no evidence of deep vein thrombosis in the lower extremity. However, portions of this examination were limited- see technologist comments above.  - No cystic structure found in the popliteal  fossa.  *See table(s) above for measurements and observations. Electronically signed by Deitra Mayo MD on 11/27/2019 at 5:40:13 PM.    Final    VAS [place] UPPER EXTREMITY VENOUS DUPLEX  Result Date: 11/26/2019 UPPER VENOUS STUDY  Indications: Swelling, and Pain Limitations: Altered mental status, patient intolerance to probe pressure, and inability to reposition due to pain/mental status. Comparison Study: No prior studies. Performing Technologist: Darlin Coco  Examination Guidelines: A complete evaluation includes B-mode imaging, spectral Doppler, color Doppler, and power Doppler as needed of all accessible portions of each vessel. Bilateral testing is considered an integral part of a complete examination. Limited examinations for reoccurring indications may be performed as noted.  Left Findings: +----------+------------+---------+-----------+----------+-------------------+ LEFT      CompressiblePhasicitySpontaneousProperties      Summary       +----------+------------+---------+-----------+----------+-------------------+ IJV           Full       Yes       Yes                                  +----------+------------+---------+-----------+----------+-------------------+ Subclavian    Full       Yes       Yes                                  +----------+------------+---------+-----------+----------+-------------------+ Axillary                                            Unable to visualize +----------+------------+---------+-----------+----------+-------------------+ Brachial    Partial                                  Age Indeterminate  +----------+------------+---------+-----------+----------+-------------------+ Radial        Full                                                      +----------+------------+---------+-----------+----------+-------------------+ Ulnar  Full                                                       +----------+------------+---------+-----------+----------+-------------------+ Cephalic      Full                                                      +----------+------------+---------+-----------+----------+-------------------+ Basilic       Full                                                      +----------+------------+---------+-----------+----------+-------------------+  Summary:  Left: No evidence of superficial vein thrombosis in the upper extremity. Findings consistent with age indeterminate deep vein thrombosis involving the left brachial veins.  *See table(s) above for measurements and observations.  Diagnosing physician: Ruta Hinds MD Electronically signed by Ruta Hinds MD on 11/26/2019 at 6:25:38 PM.    Final     Microbiology: Recent Results (from the past 240 hour(s))  Respiratory Panel by RT PCR (Flu A&B, Covid) - Nasopharyngeal Swab     Status: None   Collection Time: 11/22/19  2:29 AM   Specimen: Nasopharyngeal Swab  Result Value Ref Range Status   SARS Coronavirus 2 by RT PCR NEGATIVE NEGATIVE Final    Comment: (NOTE) SARS-CoV-2 target nucleic acids are NOT DETECTED.  The SARS-CoV-2 RNA is generally detectable in upper respiratoy specimens during the acute phase of infection. The lowest concentration of SARS-CoV-2 viral copies this assay can detect is 131 copies/mL. A negative result does not preclude SARS-Cov-2 infection and should not be used as the sole basis for treatment or other patient management decisions. A negative result may occur with  improper specimen collection/handling, submission of specimen other than nasopharyngeal swab, presence of viral mutation(s) within the areas targeted by this assay, and inadequate number of viral copies (<131 copies/mL). A negative result must be combined with clinical observations, patient history, and epidemiological information. The expected result is Negative.  Fact Sheet for Patients:    PinkCheek.be  Fact Sheet for Healthcare Providers:  GravelBags.it  This test is no t yet approved or cleared by the [place] FDA and  has been authorized for detection and/or diagnosis of SARS-CoV-2 by FDA under an Emergency Use Authorization (EUA). This EUA will remain  in effect (meaning this test can be used) for the duration of the COVID-19 declaration under Section 564(b)(1) of the Act, 21 U.S.C. section 360bbb-3(b)(1), unless the authorization is terminated or revoked sooner.     Influenza A by PCR NEGATIVE NEGATIVE Final   Influenza B by PCR NEGATIVE NEGATIVE Final    Comment: (NOTE) The Xpert Xpress SARS-CoV-2/FLU/RSV assay is intended as an aid in  the diagnosis of influenza from Nasopharyngeal swab specimens and  should not be used as a sole basis for treatment. Nasal washings and  aspirates are unacceptable for Xpert Xpress SARS-CoV-2/FLU/RSV  testing.  Fact Sheet for Patients: PinkCheek.be  Fact Sheet for Healthcare Providers: GravelBags.it  This test is not yet approved or cleared by the  [place] Architectural technologist and  has been authorized for detection and/or diagnosis of SARS-CoV-2 by  FDA under an Print production planner (EUA). This EUA will remain  in effect (meaning this test can be used) for the duration of the  Covid-19 declaration under Section 564(b)(1) of the Act, 21  U.S.C. section 360bbb-3(b)(1), unless the authorization is  terminated or revoked. Performed at [facility], [place] [address], [place], [place] [address]   Urine culture     Status: Abnormal   Collection Time: 11/22/19  3:13 AM   Specimen: Urine, Clean Catch  Result Value Ref Range Status   Specimen Description   Final    URINE, CLEAN CATCH Performed at [facility], [place] [address] [place], [place] [address]    Special Requests    Final    NONE Performed at [facility], [place] [address], [place], [place] [address]    Culture MULTIPLE SPECIES PRESENT, SUGGEST RECOLLECTION (A)  Final   Report Status 11/23/2019 FINAL  Final  Culture, blood (Routine X 2) w Reflex to ID Panel     Status: None   Collection Time: 11/22/19  6:00 AM   Specimen: BLOOD LEFT HAND  Result Value Ref Range Status   Specimen Description   Final    BLOOD LEFT HAND Performed at [place] [address] [place], [place] [address]    Special Requests   Final    BOTTLES DRAWN AEROBIC ONLY Blood Culture adequate volume Performed at [facility] [address], [place], [place] [address]    Culture   Final    NO GROWTH 5 DAYS Performed at [facility], [place] [address] [place], [place] [address]    Report Status 11/27/2019 FINAL  Final  MRSA PCR Screening     Status: None   Collection Time: 11/23/19 10:07 AM   Specimen: Nasal Mucosa; Nasopharyngeal  Result Value Ref Range Status   MRSA by PCR NEGATIVE NEGATIVE Final    Comment:        The GeneXpert MRSA Assay (FDA approved for NASAL specimens only), is one component of a comprehensive MRSA colonization surveillance program. It is not intended to diagnose MRSA infection nor to guide or monitor treatment for MRSA infections. Performed at [facility], [place] [address], [place], [place] [address]   Respiratory Panel by RT PCR (Flu A&B, Covid) - Nasopharyngeal Swab     Status: None   Collection Time: 12/01/19 11:02 AM   Specimen: Nasopharyngeal Swab  Result Value Ref Range Status   SARS Coronavirus 2 by RT PCR NEGATIVE NEGATIVE Final    Comment: (NOTE) SARS-CoV-2 target nucleic acids are NOT DETECTED.  The SARS-CoV-2 RNA is generally detectable in upper respiratoy specimens during the acute phase of infection. The lowest concentration of SARS-CoV-2 viral copies this assay can detect is 131 copies/mL. A  negative result does not preclude SARS-Cov-2 infection and should not be used as the sole basis for treatment or other patient management decisions. A negative result may occur with  improper specimen collection/handling, submission of specimen other than nasopharyngeal swab, presence of viral mutation(s) within the areas targeted by this assay, and inadequate number of viral copies (<131 copies/mL). A negative result must be combined with clinical observations, patient history, and epidemiological information. The expected result is Negative.  Fact Sheet for Patients:  PinkCheek.be  Fact Sheet for Healthcare Providers:  GravelBags.it  This test is no t yet  approved or cleared by the [place] and  has been authorized for detection and/or diagnosis of SARS-CoV-2 by FDA under an Emergency Use Authorization (EUA). This EUA will remain  in effect (meaning this test can be used) for the duration of the COVID-19 declaration under Section 564(b)(1) of the Act, 21 U.S.C. section 360bbb-3(b)(1), unless the authorization is terminated or revoked sooner.     Influenza A by PCR NEGATIVE NEGATIVE Final   Influenza B by PCR NEGATIVE NEGATIVE Final    Comment: (NOTE) The Xpert Xpress SARS-CoV-2/FLU/RSV assay is intended as an aid in  the diagnosis of influenza from Nasopharyngeal swab specimens and  should not be used as a sole basis for treatment. Nasal washings and  aspirates are unacceptable for Xpert Xpress SARS-CoV-2/FLU/RSV  testing.  Fact Sheet for Patients: PinkCheek.be  Fact Sheet for Healthcare Providers: GravelBags.it  This test is not yet approved or cleared by the [place] FDA and  has been authorized for detection and/or diagnosis of SARS-CoV-2 by  FDA under an Emergency Use Authorization (EUA). This EUA will remain  in effect (meaning this test can  be used) for the duration of the  Covid-19 declaration under Section 564(b)(1) of the Act, 21  U.S.C. section 360bbb-3(b)(1), unless the authorization is  terminated or revoked. Performed at [facility], [place] [address], [place], [place] [address]      Labs: Basic Metabolic Panel: Recent Labs  Lab 11/25/19 0354 11/25/19 1412 11/26/19 0930 11/27/19 0535 11/28/19 0334 11/29/19 0703 12/01/19 1109  NA 139   < > 136 139 140 137 132*  K 3.4*   < > 4.0 3.4* 3.0* 4.0 4.1  CL 107   < > 109 108 105 103 99  CO2 25   < > 19* 24 25 25 22   GLUCOSE 102*   < > 116* 86 85 84 101*  BUN 32*   < > 18 14 12 10 15   CREATININE 1.00   < > 0.84 0.85 0.92 0.90 1.02*  CALCIUM 6.7*   < > 7.2* 7.3* 7.4* 7.6* 8.0*  MG  --   --   --  1.4* 1.8  --   --   PHOS 1.2*  --   --  1.5* 3.0  --   --    < > = values in this interval not displayed.   Liver Function Tests: Recent Labs  Lab 11/25/19 0354 11/27/19 0535 11/28/19 0334  AST  --  21 21  ALT  --  60* 54*  ALKPHOS  --  53 66  BILITOT  --  0.6 0.7  PROT  --  4.9* 5.4*  ALBUMIN 1.7* 2.2* 2.6*   No results for input(s): LIPASE, AMYLASE in the last 168 hours. No results for input(s): AMMONIA in the last 168 hours. CBC: Recent Labs  Lab 11/25/19 0354 11/25/19 0354 11/25/19 1412 11/25/19 1412 11/25/19 2053 11/26/19 0505 11/27/19 0729 11/28/19 0334 12/01/19 1109  WBC 10.1  --  11.5*  --   --  9.5 9.2 10.5  --   NEUTROABS  --   --   --   --   --   --  5.3 5.7  --   HGB 6.8*   < > 9.8*   < > 9.7* 9.3* 9.1* 9.8* 10.6*  HCT 22.0*   < > 29.4*   < > 30.4* 30.0* 29.6* 31.3* 35.1*  MCV 103.8*  --  89.6  --   --  95.8 97.4  95.1  --   PLT 199  --  178  --   --  192 171 230  --    < > = values in this interval not displayed.   Cardiac Enzymes: No results for input(s): CKTOTAL, CKMB, CKMBINDEX, TROPONINI in the last 168 hours. BNP: BNP (last 3 results) Recent Labs    11/26/19 0505 11/29/19 0703  BNP 366.4* 172.2*    ProBNP  (last 3 results) No results for input(s): PROBNP in the last 8760 hours.  CBG: Recent Labs  Lab 11/30/19 1118 11/30/19 1631 11/30/19 2000 12/01/19 0724 12/01/19 1104  GLUCAP 134* 195* 154* 130* 100*       Signed:  Kayleen Memos, MD [facility] 12/01/2019, 12:34 PM

## 2019-12-01 NOTE — Discharge Instructions (Signed)
Deep Vein Thrombosis  Deep vein thrombosis (DVT) is a condition in which a blood clot forms in a deep vein, such as a lower leg, thigh, or arm vein. A clot is blood that has thickened into a gel or solid. This condition is dangerous. It can lead to serious and even life-threatening complications if the clot travels to the lungs and causes a blockage (pulmonary embolism). It can also damage veins in the leg. This can result in leg pain, swelling, discoloration, and sores (post-thrombotic syndrome). What are the causes? This condition may be caused by:  A slowdown of blood flow.  Damage to a vein.  A condition that causes blood to clot more easily, such as an inherited clotting disorder. What increases the risk? The following factors may make you more likely to develop this condition:  Being overweight.  Being older, especially over age 59.  Sitting or lying down for more than four hours.  Being in the hospital.  Lack of physical activity (sedentary lifestyle).  Pregnancy, being in childbirth, or having recently given birth.  Taking medicines that contain estrogen, such as medicines to prevent pregnancy.  Smoking.  A history of any of the following: ? Blood clots or a blood clotting disease. ? Peripheral vascular disease. ? Inflammatory bowel disease. ? Cancer. ? Heart disease. ? Genetic conditions that affect how your blood clots, such as Factor V Leiden mutation. ? Neurological diseases that affect your legs (leg paresis). ? A recent injury, such as a car accident. ? Major or lengthy surgery. ? A central line placed inside a large vein. What are the signs or symptoms? Symptoms of this condition include:  Swelling, pain, or tenderness in an arm or leg.  Warmth, redness, or discoloration in an arm or leg. If the clot is in your leg, symptoms may be more noticeable or worse when you stand or walk. Some people may not develop any symptoms. How is this diagnosed? This  condition is diagnosed with:  A medical history and physical exam.  Tests, such as: ? Blood tests. These are done to check how well your blood clots. ? Ultrasound. This is done to check for clots. ? Venogram. For this test, contrast dye is injected into a vein and X-rays are taken to check for any clots. How is this treated? Treatment for this condition depends on:  The cause of your DVT.  Your risk for bleeding or developing more clots.  Any other medical conditions that you have. Treatment may include:  Taking a blood thinner (anticoagulant). This type of medicine prevents clots from forming. It may be taken by mouth, injected under the skin, or injected through an IV (catheter).  Injecting clot-dissolving medicines into the affected vein (catheter-directed thrombolysis).  Having surgery. Surgery may be done to: ? Remove the clot. ? Place a filter in a large vein to catch blood clots before they reach the lungs. Some treatments may be continued for up to six months. Follow these instructions at home: If you are taking blood thinners:  Take the medicine exactly as told by your health care provider. Some blood thinners need to be taken at the same time every day. Do not skip a dose.  Talk with your health care provider before you take any medicines that contain aspirin or NSAIDs. These medicines increase your risk for dangerous bleeding.  Ask your health care provider about foods and drugs that could change the way the medicine works (may interact). Avoid those things if your  health care provider tells you to do so.  Blood thinners can cause easy bruising and may make it difficult to stop bleeding. Because of this: ? Be very careful when using knives, scissors, or other sharp objects. ? Use an electric razor instead of a blade. ? Avoid activities that could cause injury or bruising, and follow instructions about how to prevent falls.  Wear a medical alert bracelet or carry a  card that lists what medicines you take. General instructions  Take over-the-counter and prescription medicines only as told by your health care provider.  Return to your normal activities as told by your health care provider. Ask your health care provider what activities are safe for you.  Wear compression stockings if recommended by your health care provider.  Keep all follow-up visits as told by your health care provider. This is important. How is this prevented? To lower your risk of developing this condition again:  For 30 or more minutes every day, do an activity that: ? Involves moving your arms and legs. ? Increases your heart rate.  When traveling for longer than four hours: ? Exercise your arms and legs every hour. ? Drink plenty of water. ? Avoid drinking alcohol.  Avoid sitting or lying for a long time without moving your legs.  If you have surgery or you are hospitalized, ask about ways to prevent blood clots. These may include taking frequent walks or using anticoagulants.  Stay at a healthy weight.  If you are a woman who is older than age 17, avoid unnecessary use of medicines that contain estrogen, such as some birth control pills.  Do not use any products that contain nicotine or tobacco, such as cigarettes and e-cigarettes. This is especially important if you take estrogen medicines. If you need help quitting, ask your health care provider. Contact a health care provider if:  You miss a dose of your blood thinner.  Your menstrual period is heavier than usual.  You have unusual bruising. Get help right away if:  You have: ? New or increased pain, swelling, or redness in an arm or leg. ? Numbness or tingling in an arm or leg. ? Shortness of breath. ? Chest pain. ? A rapid or irregular heartbeat. ? A severe headache or confusion. ? A cut that will not stop bleeding.  There is blood in your vomit, stool, or urine.  You have a serious fall or accident,  or you hit your head.  You feel light-headed or dizzy.  You cough up blood. These symptoms may represent a serious problem that is an emergency. Do not wait to see if the symptoms will go away. Get medical help right away. Call your local emergency services (911 in the [place] Do not drive yourself to the hospital. Summary  Deep vein thrombosis (DVT) is a condition in which a blood clot forms in a deep vein, such as a lower leg, thigh, or arm vein.  Symptoms can include swelling, warmth, pain, and redness in your leg or arm.  This condition may be treated with a blood thinner (anticoagulant medicine), medicine that is injected to dissolve blood clots,compression stockings, or surgery.  If you are prescribed blood thinners, take them exactly as told. This information is not intended to replace advice given to you by your health care provider. Make sure you discuss any questions you have with your health care provider. Document Revised: 12/31/2016 Document Reviewed: 06/18/2016 Elsevier Patient Education  [place]. Hematemesis Hematemesis is when  you vomit blood. It is a sign of bleeding in the upper GI tract (gastrointestinal tract). The upper GI tract includes the mouth, throat, esophagus, stomach, and the upper part of the small intestine (duodenum). Hematemesis is usually caused by bleeding in the esophagus or stomach. You may suddenly vomit bright red blood. Or, the blood may look like coffee grounds. You may also have other symptoms, such as:  Stomach pain.  Heartburn.  Stool (feces) that looks black and tarry. Follow these instructions at home:   Take over-the-counter and prescription medicines only as told by your health care provider.  Do not take NSAIDs, including aspirin and ibuprofen, unless your health care provider approves. These medicines can increase bleeding.  Rest as needed.  Drink enough fluids to keep your urine pale yellow. Take small sips of fluid at a  time.  Do not drink alcohol.  Do not use any products that contain nicotine or tobacco, such as cigarettes and e-cigarettes. If you need help quitting, ask your health care provider.  Keep all follow-up visits as told by your health care provider. This is important. Contact a health care provider if:  You have more blood in your vomit.  Your vomiting of blood begins again after it has stopped.  You have persistent stomach pain.  You have nausea, indigestion, or heartburn. Get help right away if:  You faint.  You feel weak or dizzy.  You are urinating less than normal or not at all.  You vomit up: ? Large amounts of blood or dark material that may look like coffee grounds. ? Bright red blood.  You have any of the following: ? Persistent vomiting. ? A rapid heartbeat. ? Blood in your stool. ? Chest pain. ? Difficulty breathing. These symptoms may represent a serious problem that is an emergency. Do not wait to see if the symptoms will go away. Get medical help right away. Call your local emergency services (911 in the [place]). Do not drive yourself to the hospital. Summary  Hematemesis is when you vomit blood. It is a sign of bleeding in the upper GI tract (gastrointestinal tract).  Hematemesis is usually caused by bleeding in the esophagus or stomach.  Do not take NSAIDs (including aspirin and ibuprofen), drink alcohol, or use tobacco products.  Take over-the-counter and prescription medicines only as told by your health care provider. This information is not intended to replace advice given to you by your health care provider. Make sure you discuss any questions you have with your health care provider. Document Revised: 01/28/2017 Document Reviewed: 01/28/2017 Elsevier Patient Education  2020 [place] on my medicine - ELIQUIS (apixaban)  This medication education can be reviewed with my healthcare representative as part of my discharge  preparation. Pharmacy would be happy to speak with family member or care provider.   Why was Eliquis prescribed for you? Eliquis was prescribed to treat blood clots that may have been found in the veins of your legs (deep vein thrombosis) or in your lungs (pulmonary embolism) and to reduce the risk of them occurring again.  What do You need to know about Eliquis ? The starting dose is 10 mg (two 5 mg tablets) taken TWICE daily for the FIRST SEVEN (7) DAYS, then on (enter date)  12/08/2019  the dose is reduced to ONE 5 mg tablet taken TWICE daily.  Eliquis may be taken with or without food.   Try to take the dose about the same time in  the morning and in the evening. If you have difficulty swallowing the tablet whole please discuss with your pharmacist how to take the medication safely.  Take Eliquis exactly as prescribed and DO NOT stop taking Eliquis without talking to the doctor who prescribed the medication.  Stopping may increase your risk of developing a new blood clot.  Refill your prescription before you run out.  After discharge, you should have regular check-up appointments with your healthcare provider that is prescribing your Eliquis.    What do you do if you miss a dose? If a dose of ELIQUIS is not taken at the scheduled time, take it as soon as possible on the same day and twice-daily administration should be resumed. The dose should not be doubled to make up for a missed dose.  Important Safety Information A possible side effect of Eliquis is bleeding. You should call your healthcare provider right away if you experience any of the following: ? Bleeding from an injury or your nose that does not stop. ? Unusual colored urine (red or dark brown) or unusual colored stools (red or black). ? Unusual bruising for unknown reasons. ? A serious fall or if you hit your head (even if there is no bleeding).  Some medicines may interact with Eliquis and might increase your risk of  bleeding or clotting while on Eliquis. To help avoid this, consult your healthcare provider or pharmacist prior to using any new prescription or non-prescription medications, including herbals, vitamins, non-steroidal anti-inflammatory drugs (NSAIDs) and supplements.  This website has more information on Eliquis (apixaban): http://www.eliquis.com/eliquis/home

## 2019-12-01 NOTE — Progress Notes (Signed)
Writer has given report to "Programmer, applications at [employer].

## 2019-12-01 NOTE — Progress Notes (Signed)
Patient has been calm and cooperative throughout night. Slept throughout the night.  participated in care and asking questions pertaining to care. Patient woke up hungry and ate pudding. Dressing change done, repositioned, states comfortable at this time.

## 2019-12-01 NOTE — Progress Notes (Signed)
[facility] for Apixaban Indication: DVT of LUE  Allergies  Allergen Reactions  . 5-Alpha Reductase Inhibitors    Patient Measurements: Height: 5\' 1"  (154.9 cm) Weight: 60.5 kg (133 lb 6.1 oz) IBW/kg (Calculated) : 47.8  Vital Signs: Temp: 98.2 F (36.8 C) (10/30 0557) BP: 118/48 (10/30 0557) Pulse Rate: 93 (10/30 0557)  Labs: Recent Labs    11/29/19 0703  CREATININE 0.90   Estimated Creatinine Clearance: 40.2 mL/min (by C-G formula based on SCr of 0.9 mg/dL).  Medical History: Past Medical History:  Diagnosis Date  . Acute pyelonephritis   . Alzheimer's disease ([place])    alzheimer's dementia   . Bilateral foot-drop   . Bladder outlet obstruction   . Cholelithiasis   . Constipation   . Depression   . Diabetes mellitus without complication ([place])    type 2   . Diabetic ulcer of right buttock ([place])   . Diarrhea   . Dysrhythmia    ? hx of afib postop 1/31-03/06/18   . Environmental and seasonal allergies   . Essential hypertension, benign   . Foley catheter in place   . GERD (gastroesophageal reflux disease)   . History of kidney stones   . History of urinary tract infection   . Hyperlipidemia   . Hypertension   . Hypothyroidism   . Incontinence    wears diaper   . Insomnia   . Nephrolithiasis   . Osteoarthritis   . Pyelonephritis   . Reflux esophagitis   . Rickets, active   . Sepsis ([place])   . Sepsis ([place])    hx of   . Unspecified constipation   . Unspecified psychosis   . Unspecified vitamin D deficiency   . Vitamin D deficiency    Medications:  Scheduled:  . (feeding supplement) PROSource Plus  30 mL Oral BID BM  . Chlorhexidine Gluconate Cloth  6 each Topical Daily  . collagenase   Topical Daily  . furosemide  20 mg Oral Daily  . losartan  25 mg Oral Daily  . mouth rinse  15 mL Mouth Rinse BID  . pantoprazole  40 mg Oral BID AC  . polyvinyl alcohol  1 drop Both Eyes TID  . senna  2 tablet Oral QHS     Assessment: 84 yoF from SNF, admit 10/21 with abd pain.  Hx of HTN, HPL, Alzheimer's dementia. Hematemesis during admit, transfused x1 10/24. Current Hgb improved to 10.6 today 10/30 10/25 UE Doppler: LUE DVT 10/25 LE Doppler: neg for DVT  Goal of Therapy:  Monitor platelets by anticoagulation protocol: Yes   Plan:  Apixaban 10mg  bid x 7 days, followed by 5mg  bid on 11/6 Follow CBC, monitor s/s bleed   Leul Narramore, [place] [phone] 12/01/2019,11:12 AM

## 2019-12-01 NOTE — Progress Notes (Signed)
Pt leaving this afternoon via EMS, headed to [facility], [place]. Per SW, pt's family has been made aware.

## 2019-12-01 NOTE — NC FL2 (Addendum)
[place] LEVEL OF CARE SCREENING TOOL     IDENTIFICATION  Patient Name: Ana Newman Birthdate: 1935/02/14 Sex: female Admission Date (Current Location): 11/22/2019  [place] and [place] Number:  Ana Newman [id number] Facility and Address:  [facility],  [address], [place]      Provider Number: [id number]  Attending Physician Name and Address:  Kayleen Memos, DO  Relative Name and Phone Number:  Ana Newman Daughter [phone]  [phone]    Current Level of Care: Hospital Recommended Level of Care: [place] Prior Approval Number:    Date Approved/Denied:   PASRR Number: [id number]  Discharge Plan: SNF    Current Diagnoses: Patient Active Problem List   Diagnosis Date Noted  . Anemia due to blood loss 11/25/2019  . Pressure injury of skin 11/23/2019  . Pyelonephritis 03/03/2018  . Leukocytosis 03/03/2018  . Ureterolithiasis 03/03/2018  . Late onset Alzheimer's disease without behavioral disturbance ([place]) 09/18/2017  . Major depression with psychotic features ([place]) 03/14/2017  . Weight loss, non-intentional 10/25/2016  . Chronic constipation 10/08/2016  . Hypertension associated with diabetes ([place]) 05/14/2016  . Bladder outlet obstruction 06/17/2015  . Nephrolithiasis 05/18/2015  . Sepsis due to urinary tract infection ([place]) 05/17/2015  . AKI (acute kidney injury) ([place]) 05/17/2015  . Environmental and seasonal allergies 04/06/2015  . Dyslipidemia associated with type 2 diabetes mellitus ([place]) 02/04/2015  . Diabetes mellitus, type II, insulin dependent ([place]) 02/04/2015  . Depression 01/18/2014  . Hypothyroidism 01/18/2014  . Psychosis ([place])   . Osteoarthritis     Orientation RESPIRATION BLADDER Height & Weight     Self, Situation  Normal Incontinent Weight: 133 lb 6.1 oz (60.5 kg) Height:  5\' 1"  (154.9 cm)  BEHAVIORAL SYMPTOMS/MOOD NEUROLOGICAL BOWEL NUTRITION STATUS      Incontinent  Diet (Renal diet)  AMBULATORY STATUS COMMUNICATION OF NEEDS Skin   Total Care Verbally PU Stage and Appropriate Care     PU Stage 3 Dressing:  (PRN dressing changes)                 Personal Care Assistance Level of Assistance  Bathing, Feeding, Dressing, Total care Bathing Assistance: Maximum assistance Feeding assistance: Maximum assistance Dressing Assistance: Maximum assistance Total Care Assistance: Maximum assistance   Functional Limitations Info  Sight, Speech, Hearing Sight Info: Adequate Hearing Info: Adequate Speech Info: Adequate    SPECIAL CARE FACTORS FREQUENCY                       Contractures Contractures Info: Present    Additional Factors Info  Code Status, Allergies Code Status Info: DNR Allergies Info: 5-alpha Reductase Inhibitors           Current Medications (12/01/2019):  This is the current hospital active medication list Current Facility-Administered Medications  Medication Dose Route Frequency Provider Last Rate Last Admin  . (feeding supplement) PROSource Plus liquid 30 mL  30 mL Oral BID BM Hall, Carole N, DO   30 mL at 12/01/19 0909  . 0.9 %  sodium chloride infusion  250 mL Intravenous Continuous Kyle, Tyrone A, DO 10 mL/hr at 11/25/19 7591 Restarted at 11/25/19 6384  . acetaminophen (TYLENOL) tablet 650 mg  650 mg Oral Q6H PRN Irene Pap N, DO   650 mg at 11/30/19 1222  . apixaban (ELIQUIS) tablet 10 mg  10 mg Oral BID Minda Ditto, RPH   10 mg at 12/01/19 1156   Followed by  . [START  ON 12/08/2019] apixaban (ELIQUIS) tablet 5 mg  5 mg Oral BID Minda Ditto, RPH      . bisacodyl (DULCOLAX) suppository 10 mg  10 mg Rectal Daily PRN Marylyn Ishihara, Tyrone A, DO      . Chlorhexidine Gluconate Cloth 2 % PADS 6 each  6 each Topical Daily Hunsucker, Bonna Gains, MD   6 each at 12/01/19 0911  . collagenase (SANTYL) ointment   Topical Daily Elodia Florence., MD   Given at 12/01/19 2820  . furosemide (LASIX) tablet 20 mg  20 mg Oral Daily  Charolette Forward, MD   20 mg at 12/01/19 0909  . losartan (COZAAR) tablet 25 mg  25 mg Oral Daily Charolette Forward, MD   25 mg at 12/01/19 0909  . MEDLINE mouth rinse  15 mL Mouth Rinse BID Hunsucker, Bonna Gains, MD   15 mL at 11/30/19 2213  . melatonin tablet 5 mg  5 mg Oral QHS PRN Lovey Newcomer T, NP   5 mg at 11/30/19 2213  . ondansetron (ZOFRAN) tablet 4 mg  4 mg Oral Q6H PRN Marylyn Ishihara, Tyrone A, DO       Or  . ondansetron (ZOFRAN) injection 4 mg  4 mg Intravenous Q6H PRN Marylyn Ishihara, Tyrone A, DO   4 mg at 11/22/19 0916  . pantoprazole (PROTONIX) EC tablet 40 mg  40 mg Oral BID AC Tanda Rockers, MD   40 mg at 12/01/19 0837  . polyvinyl alcohol (LIQUIFILM TEARS) 1.4 % ophthalmic solution 1 drop  1 drop Both Eyes TID Marylyn Ishihara, Tyrone A, DO   1 drop at 12/01/19 0912  . promethazine (PHENERGAN) injection 12.5 mg  12.5 mg Intravenous Q6H PRN Marylyn Ishihara, Tyrone A, DO   12.5 mg at 11/22/19 1430  . senna (SENOKOT) tablet 17.2 mg  2 tablet Oral QHS Kyle, Tyrone A, DO   17.2 mg at 11/30/19 2213  . traMADol (ULTRAM) tablet 50 mg  50 mg Oral Q6H PRN Irene Pap N, DO   50 mg at 12/01/19 1107     Discharge Medications: Please see discharge summary for a list of discharge medications.  Relevant Imaging Results:  Relevant Lab Results:   Additional Information SSN [id number]  Ross Ludwig, LCSW

## 2019-12-03 DIAGNOSIS — F333 Major depressive disorder, recurrent, severe with psychotic symptoms: Secondary | ICD-10-CM | POA: Diagnosis not present

## 2019-12-04 ENCOUNTER — Inpatient Hospital Stay (HOSPITAL_COMMUNITY)
Admission: EM | Admit: 2019-12-04 | Discharge: 2019-12-10 | DRG: 393 | Disposition: A | Discharge status: Skilled Nursing Facility | Payer: Medicare Other | Source: Skilled Nursing Facility | Attending: Internal Medicine | Admitting: Internal Medicine

## 2019-12-04 ENCOUNTER — Encounter (HOSPITAL_COMMUNITY): Payer: Self-pay

## 2019-12-04 ENCOUNTER — Other Ambulatory Visit: Payer: Self-pay

## 2019-12-04 DIAGNOSIS — Z90710 Acquired absence of both cervix and uterus: Secondary | ICD-10-CM

## 2019-12-04 DIAGNOSIS — I152 Hypertension secondary to endocrine disorders: Secondary | ICD-10-CM | POA: Diagnosis present

## 2019-12-04 DIAGNOSIS — D500 Iron deficiency anemia secondary to blood loss (chronic): Secondary | ICD-10-CM | POA: Diagnosis not present

## 2019-12-04 DIAGNOSIS — E039 Hypothyroidism, unspecified: Secondary | ICD-10-CM | POA: Diagnosis present

## 2019-12-04 DIAGNOSIS — R404 Transient alteration of awareness: Secondary | ICD-10-CM | POA: Diagnosis not present

## 2019-12-04 DIAGNOSIS — K626 Ulcer of anus and rectum: Principal | ICD-10-CM | POA: Diagnosis present

## 2019-12-04 DIAGNOSIS — L89313 Pressure ulcer of right buttock, stage 3: Secondary | ICD-10-CM | POA: Diagnosis present

## 2019-12-04 DIAGNOSIS — K922 Gastrointestinal hemorrhage, unspecified: Secondary | ICD-10-CM | POA: Diagnosis not present

## 2019-12-04 DIAGNOSIS — K921 Melena: Secondary | ICD-10-CM | POA: Diagnosis not present

## 2019-12-04 DIAGNOSIS — E1159 Type 2 diabetes mellitus with other circulatory complications: Secondary | ICD-10-CM | POA: Diagnosis present

## 2019-12-04 DIAGNOSIS — Z87442 Personal history of urinary calculi: Secondary | ICD-10-CM

## 2019-12-04 DIAGNOSIS — K222 Esophageal obstruction: Secondary | ICD-10-CM | POA: Diagnosis present

## 2019-12-04 DIAGNOSIS — Z7401 Bed confinement status: Secondary | ICD-10-CM

## 2019-12-04 DIAGNOSIS — L89159 Pressure ulcer of sacral region, unspecified stage: Secondary | ICD-10-CM | POA: Diagnosis not present

## 2019-12-04 DIAGNOSIS — L8990 Pressure ulcer of unspecified site, unspecified stage: Secondary | ICD-10-CM | POA: Diagnosis present

## 2019-12-04 DIAGNOSIS — Z79899 Other long term (current) drug therapy: Secondary | ICD-10-CM

## 2019-12-04 DIAGNOSIS — K449 Diaphragmatic hernia without obstruction or gangrene: Secondary | ICD-10-CM | POA: Diagnosis present

## 2019-12-04 DIAGNOSIS — K2970 Gastritis, unspecified, without bleeding: Secondary | ICD-10-CM | POA: Diagnosis present

## 2019-12-04 DIAGNOSIS — F0280 Dementia in other diseases classified elsewhere without behavioral disturbance: Secondary | ICD-10-CM | POA: Diagnosis present

## 2019-12-04 DIAGNOSIS — G309 Alzheimer's disease, unspecified: Secondary | ICD-10-CM | POA: Diagnosis present

## 2019-12-04 DIAGNOSIS — Z20822 Contact with and (suspected) exposure to covid-19: Secondary | ICD-10-CM | POA: Diagnosis not present

## 2019-12-04 DIAGNOSIS — L89150 Pressure ulcer of sacral region, unstageable: Secondary | ICD-10-CM | POA: Diagnosis not present

## 2019-12-04 DIAGNOSIS — I10 Essential (primary) hypertension: Secondary | ICD-10-CM | POA: Diagnosis not present

## 2019-12-04 DIAGNOSIS — E559 Vitamin D deficiency, unspecified: Secondary | ICD-10-CM | POA: Diagnosis present

## 2019-12-04 DIAGNOSIS — Z86718 Personal history of other venous thrombosis and embolism: Secondary | ICD-10-CM

## 2019-12-04 DIAGNOSIS — E1169 Type 2 diabetes mellitus with other specified complication: Secondary | ICD-10-CM | POA: Diagnosis present

## 2019-12-04 DIAGNOSIS — D122 Benign neoplasm of ascending colon: Secondary | ICD-10-CM | POA: Diagnosis present

## 2019-12-04 DIAGNOSIS — D62 Acute posthemorrhagic anemia: Secondary | ICD-10-CM | POA: Diagnosis present

## 2019-12-04 DIAGNOSIS — L89320 Pressure ulcer of left buttock, unstageable: Secondary | ICD-10-CM | POA: Diagnosis present

## 2019-12-04 DIAGNOSIS — R32 Unspecified urinary incontinence: Secondary | ICD-10-CM | POA: Diagnosis present

## 2019-12-04 DIAGNOSIS — E785 Hyperlipidemia, unspecified: Secondary | ICD-10-CM | POA: Diagnosis present

## 2019-12-04 DIAGNOSIS — R5381 Other malaise: Secondary | ICD-10-CM | POA: Diagnosis present

## 2019-12-04 DIAGNOSIS — Z7901 Long term (current) use of anticoagulants: Secondary | ICD-10-CM

## 2019-12-04 DIAGNOSIS — R58 Hemorrhage, not elsewhere classified: Secondary | ICD-10-CM | POA: Diagnosis not present

## 2019-12-04 DIAGNOSIS — Z87440 Personal history of urinary (tract) infections: Secondary | ICD-10-CM

## 2019-12-04 LAB — PROTIME-INR
INR: 1.4 — ABNORMAL HIGH (ref 0.8–1.2)
Prothrombin Time: 16.8 seconds — ABNORMAL HIGH (ref 11.4–15.2)

## 2019-12-04 LAB — COMPREHENSIVE METABOLIC PANEL
ALT: 19 U/L (ref 0–44)
AST: 18 U/L (ref 15–41)
Albumin: 2.9 g/dL — ABNORMAL LOW (ref 3.5–5.0)
Alkaline Phosphatase: 89 U/L (ref 38–126)
Anion gap: 10 (ref 5–15)
BUN: 21 mg/dL (ref 8–23)
CO2: 26 mmol/L (ref 22–32)
Calcium: 8.6 mg/dL — ABNORMAL LOW (ref 8.9–10.3)
Chloride: 107 mmol/L (ref 98–111)
Creatinine, Ser: 1 mg/dL (ref 0.44–1.00)
GFR, Estimated: 56 mL/min — ABNORMAL LOW (ref >60)
Glucose, Bld: 147 mg/dL — ABNORMAL HIGH (ref 70–99)
Potassium: 4.1 mmol/L (ref 3.5–5.1)
Sodium: 143 mmol/L (ref 135–145)
Total Bilirubin: 0.6 mg/dL (ref 0.3–1.2)
Total Protein: 6.8 g/dL (ref 6.5–8.1)

## 2019-12-04 LAB — CBC WITH DIFFERENTIAL/PLATELET
Abs Immature Granulocytes: 0.02 10*3/uL (ref 0.00–0.07)
Basophils Absolute: 0 10*3/uL (ref 0.0–0.1)
Basophils Relative: 1 %
Eosinophils Absolute: 0 10*3/uL (ref 0.0–0.5)
Eosinophils Relative: 0 %
HCT: 34.3 % — ABNORMAL LOW (ref 36.0–46.0)
Hemoglobin: 10.3 g/dL — ABNORMAL LOW (ref 12.0–15.0)
Immature Granulocytes: 0 %
Lymphocytes Relative: 31 %
Lymphs Abs: 2.4 10*3/uL (ref 0.7–4.0)
MCH: 29.9 pg (ref 26.0–34.0)
MCHC: 30 g/dL (ref 30.0–36.0)
MCV: 99.4 fL (ref 80.0–100.0)
Monocytes Absolute: 0.7 10*3/uL (ref 0.1–1.0)
Monocytes Relative: 9 %
Neutro Abs: 4.6 10*3/uL (ref 1.7–7.7)
Neutrophils Relative %: 59 %
Platelets: 310 10*3/uL (ref 150–400)
RBC: 3.45 MIL/uL — ABNORMAL LOW (ref 3.87–5.11)
RDW: 16.9 % — ABNORMAL HIGH (ref 11.5–15.5)
WBC: 7.7 10*3/uL (ref 4.0–10.5)
nRBC: 0 % (ref 0.0–0.2)

## 2019-12-04 LAB — APTT: aPTT: 38 seconds — ABNORMAL HIGH (ref 24–36)

## 2019-12-04 LAB — POC OCCULT BLOOD, ED: Fecal Occult Bld: POSITIVE — AB

## 2019-12-04 LAB — RESPIRATORY PANEL BY RT PCR (FLU A&B, COVID)
Influenza A by PCR: NEGATIVE
Influenza B by PCR: NEGATIVE
SARS Coronavirus 2 by RT PCR: NEGATIVE

## 2019-12-04 MED ORDER — ACETAMINOPHEN 325 MG PO TABS
650.0000 mg | ORAL_TABLET | Freq: Four times a day (QID) | ORAL | Status: DC | PRN
  Filled 2019-12-04: qty 2

## 2019-12-04 MED ORDER — LACTATED RINGERS IV SOLN: INTRAVENOUS | Status: DC

## 2019-12-04 MED ORDER — MORPHINE SULFATE (PF) 2 MG/ML IV SOLN
2.0000 mg | INTRAVENOUS | Status: DC | PRN
  Administered 2019-12-06: 2 mg via INTRAVENOUS
  Filled 2019-12-04: qty 1

## 2019-12-04 MED ORDER — SODIUM CHLORIDE 0.9 % IV SOLN: INTRAVENOUS | Status: DC

## 2019-12-04 MED ORDER — POLYETHYLENE GLYCOL 3350 17 G PO PACK: 17.0000 g | PACK | Freq: Every day | ORAL | Status: DC | PRN

## 2019-12-04 MED ORDER — HYDROCODONE-ACETAMINOPHEN 5-325 MG PO TABS
1.0000 | ORAL_TABLET | ORAL | Status: DC | PRN
  Administered 2019-12-05 (×2): 2 via ORAL
  Filled 2019-12-04 (×3): qty 2

## 2019-12-04 MED ORDER — COLLAGENASE 250 UNIT/GM EX OINT
TOPICAL_OINTMENT | Freq: Every day | CUTANEOUS | Status: DC
  Filled 2019-12-04: qty 30

## 2019-12-04 MED ORDER — ACETAMINOPHEN 650 MG RE SUPP: 650.0000 mg | Freq: Four times a day (QID) | RECTAL | Status: DC | PRN

## 2019-12-04 MED ORDER — PANTOPRAZOLE SODIUM 40 MG PO TBEC
40.0000 mg | DELAYED_RELEASE_TABLET | Freq: Two times a day (BID) | ORAL | Status: DC
  Administered 2019-12-04 – 2019-12-10 (×11): 40 mg via ORAL
  Filled 2019-12-04 (×11): qty 1

## 2019-12-04 NOTE — ED Triage Notes (Signed)
Pt BIB EMS, facility discovered bright red blood and clots in her brief. Facility reports bedsores on buttocks, EMS unable to evaluate. Pt started on Eliquis on 10/31. Hx kidney stones being treated with cranberry juice.   BP 158/100 HR 88 SpO2 100% RA

## 2019-12-04 NOTE — ED Notes (Signed)
Bed alarm placed.  

## 2019-12-04 NOTE — ED Notes (Signed)
Spoke to pt's daughter Chaya Jan. Provided update.

## 2019-12-04 NOTE — Consult Note (Signed)
Referring Provider: Dr. Tomi Bamberger (ED) Primary Care Physician:  Patient, No Pcp Per Primary Gastroenterologist:  Althia Forts  Reason for Consultation:  Rectal bleeding  HPI: Ana Newman is a 84 y.o. female with past medical history of dementia, bedbound with chronic debility, and LUE DVT (started Eliquis 12/01/2019) presenting for consultation of rectal bleeding.  Patient was brought in by EMS after staff at nursing facility noticed bright red blood and clots in patient's briefs.  RN denies any further episodes of bleeding since patient has been admitted.  Hospitalist stated that patient has a large sacral wound but there was bloody stool present on DRE.  Patient denies any current complaints.  States she is unsure if she has had recent rectal bleeding or melena.  Denies any nausea, vomiting, dysphagia, abdominal pain.  States she is unsure if she has any family history of colon cancer or gastrointestinal malignancy.  Patient was recently hospitalized from 11/22/2019 to 12/01/2019 with upper GI bleeding (coffee-ground emesis, melena).  She was treated conservatively with Protonix IV with improvement.  Patient has dementia, though is able to tell me the year and location (it was a long hospital).  Unable to report president.  Colonoscopy 01/2004 normal. EGD 01/2004 with reflux changes, normal esophageal biopsies.  Past Medical History:  Diagnosis Date   Acute pyelonephritis    Alzheimer's disease ([place])    alzheimer's dementia    Bilateral foot-drop    Bladder outlet obstruction    Cholelithiasis    Constipation    Depression    Diabetes mellitus without complication ([place])    type 2    Diabetic ulcer of right buttock ([place])    Diarrhea    Dysrhythmia    ? hx of afib postop 1/31-03/06/18    Environmental and seasonal allergies    Essential hypertension, benign    Foley catheter in place    GERD (gastroesophageal reflux disease)    History of kidney stones    History  of urinary tract infection    Hyperlipidemia    Hypertension    Hypothyroidism    Incontinence    wears diaper    Insomnia    Nephrolithiasis    Osteoarthritis    Pyelonephritis    Reflux esophagitis    Rickets, active    Sepsis ([place])    Sepsis ([place])    hx of    Unspecified constipation    Unspecified psychosis    Unspecified vitamin D deficiency    Vitamin D deficiency     Past Surgical History:  Procedure Laterality Date   ABDOMINAL HYSTERECTOMY     CYSTOSCOPY W/ URETERAL STENT PLACEMENT Bilateral 05/18/2015   Procedure: CYSTOSCOPY WITH RETROGRADE PYELOGRAM/URETERAL STENT PLACEMENT;  Surgeon: Carolan Clines, MD;  Location: [facility];  Service: Urology;  Laterality: Bilateral;   CYSTOSCOPY W/ URETERAL STENT PLACEMENT Bilateral 03/03/2018   Procedure: CYSTOSCOPY WITH RETROGRADE PYELOGRAM/URETERAL STENT PLACEMENT;  Surgeon: Franchot Gallo, MD;  Location: [facility];  Service: Urology;  Laterality: Bilateral;   CYSTOSCOPY W/ URETERAL STENT REMOVAL Bilateral 09/12/2015   Procedure: CYSTOSCOPY WITH STENT REMOVAL;  Surgeon: Carolan Clines, MD;  Location: [facility];  Service: Urology;  Laterality: Bilateral;  1 HOUR   CYSTOSCOPY WITH RETROGRADE PYELOGRAM, URETEROSCOPY AND STENT PLACEMENT Bilateral 07/18/2015   Procedure: CYSTOSCOPY REMOVE LEFT DOUBLE J STENT LEFT RETROGRADE PYELOGRAM, LEFT URETEROSCOPY, PYELOSCOPY, REPLACEMENT OF LEFT DOUBLE J STENT;  Surgeon: Carolan Clines, MD;  Location: [facility];  Service: Urology;  Laterality: Bilateral;   CYSTOSCOPY WITH RETROGRADE PYELOGRAM, URETEROSCOPY AND  STENT PLACEMENT Bilateral 06/08/2018   Procedure: CYSTOSCOPY WITH RETROGRADE PYELOGRAM, URETEROSCOPY AND STENT PLACEMENT;  Surgeon: Franchot Gallo, MD;  Location: [facility];  Service: Urology;  Laterality: Bilateral;  90 MINS   HOLMIUM LASER APPLICATION Bilateral 8/56/3149   Procedure: HOLMIUM LASER OF LEFT LOWER URETERAL STONE, LEFT PYELOSCOPY, REPLACE LEFT DOUBLE J STENT  REMOVE RIGHT DOUBLE J STENT RIGHT RETROGRADE PYELOGRAM, REPLACEMENT RIGHT DOUBLE J STENT ;  Surgeon: Carolan Clines, MD;  Location: [facility];  Service: Urology;  Laterality: Bilateral;   HOLMIUM LASER APPLICATION Bilateral 7/0/2637   Procedure: HOLMIUM LASER APPLICATION;  Surgeon: Franchot Gallo, MD;  Location: [facility];  Service: Urology;  Laterality: Bilateral;   KNEE ARTHROSCOPY Left 1997    Prior to Admission medications   Medication Sig Start Date End Date Taking? Authorizing Provider  acetaminophen (TYLENOL) 500 MG tablet Take 1,000 mg by mouth daily. Pain management   Yes [provider]  APIXABAN (ELIQUIS) VTE STARTER PACK (10MG  AND 5MG ) Take as directed on package: start with two-5mg  tablets twice daily for 7 days. On day 8, switch to one-5mg  tablet twice daily. Patient taking differently: Take 10 mg by mouth in the morning and at bedtime.  12/01/19  Yes Hall, Carole N, DO  bisacodyl (DULCOLAX) 10 MG suppository Place 10 mg rectally daily as needed for moderate constipation.    Yes [provider]  Cholecalciferol (VITAMIN D3) 50 MCG (2000 UT) TABS Take 2,000 Units by mouth daily.   Yes [provider]  collagenase (SANTYL) ointment Apply topically daily. 12/02/19  Yes Hall, Carole N, DO  Cranberry 450 MG CAPS Take 450 mg by mouth daily.   Yes [provider]  docusate sodium (COLACE) 100 MG capsule Take 100 mg by mouth 2 (two) times daily.   Yes [provider]  furosemide (LASIX) 20 MG tablet Take 1 tablet (20 mg total) by mouth daily. 12/02/19 01/31/20 Yes Hall, Lorenda Cahill, DO  linaclotide (LINZESS) 145 MCG CAPS capsule Take 145 mcg by mouth daily before breakfast.   Yes [provider]  losartan (COZAAR) 25 MG tablet Take 1 tablet (25 mg total) by mouth daily. 12/02/19 01/31/20 Yes Kayleen Memos, DO  Multiple Vitamin (MULTIVITAMIN WITH MINERALS) TABS tablet Take 1 tablet by mouth daily.   Yes [provider]   Nutritional Supplements (PROMOD) LIQD Take 30 mLs by mouth 2 (two) times daily.   Yes [provider]  omeprazole (PRILOSEC) 20 MG capsule Take 20 mg by mouth daily.   Yes [provider]  ondansetron (ZOFRAN) 4 MG tablet Take 4 mg by mouth every 6 (six) hours as needed for nausea or vomiting.   Yes [provider]  polyethylene glycol (MIRALAX / GLYCOLAX) packet Take 17 g by mouth every 12 (twelve) hours as needed for mild constipation.   Yes [provider]  Polyvinyl Alcohol-Povidone (FRESHKOTE) 2.7-2 % SOLN Apply 1 drop to eye 3 (three) times daily. To both eyes   Yes [provider]  rosuvastatin (CRESTOR) 10 MG tablet Take 10 mg by mouth daily.   Yes [provider]  senna (SENOKOT) 8.6 MG tablet Take 2 tablets by mouth at bedtime.   Yes [provider]  traMADol (ULTRAM) 50 MG tablet Take 1 tablet (50 mg total) by mouth every 6 (six) hours as needed for up to 3 days for severe pain. 12/01/19 12/04/19 Yes Hall, Lorenda Cahill, DO  ZINC OXIDE, TOPICAL, 10 % CREA Apply 1 application topically in the morning and  at bedtime.   Yes [provider]  pantoprazole (PROTONIX) 40 MG tablet Take 1 tablet (40 mg total) by mouth 2 (two) times daily before a meal. Please take 1 tablet 40 mg twice a day for 60 days, then once a day after. Patient not taking: Reported on 12/04/2019 12/01/19 01/30/20  Kayleen Memos, DO    Scheduled Meds:  collagenase   Topical Daily   Continuous Infusions:  lactated ringers     PRN Meds:.acetaminophen **OR** acetaminophen, HYDROcodone-acetaminophen, morphine injection, polyethylene glycol  Allergies as of 12/04/2019 - Review Complete 12/04/2019  Allergen Reaction Noted   5-alpha reductase inhibitors  04/12/2018    Family History  Family history unknown: Yes    Social History   Socioeconomic History   Marital status: Married    Spouse name: Not on file   Number of children: Not on file    Years of education: Not on file   Highest education level: Not on file  Occupational History   Not on file  Tobacco Use   Smoking status: Never Smoker   Smokeless tobacco: Never Used  Substance and Sexual Activity   Alcohol use: No   Drug use: No   Sexual activity: Not on file  Other Topics Concern   Not on file  Social History Narrative   Not on file   Social Determinants of Health   Financial Resource Strain:    Difficulty of Paying Living Expenses: Not on file  Food Insecurity:    Worried About [place] in the Last Year: Not on file   [employer] of Food in the Last Year: Not on file  Transportation Needs:    Lack of Transportation (Medical): Not on file   Lack of Transportation (Non-Medical): Not on file  Physical Activity:    Days of Exercise per Week: Not on file   Minutes of Exercise per Session: Not on file  Stress:    Feeling of Stress : Not on file  Social Connections:    Frequency of Communication with Friends and Family: Not on file   Frequency of Social Gatherings with Friends and Family: Not on file   Attends Religious Services: Not on file   Active Member of Clubs or Organizations: Not on file   Attends Archivist Meetings: Not on file   Marital Status: Not on file  Intimate Partner Violence:    Fear of Current or Ex-Partner: Not on file   Emotionally Abused: Not on file   Physically Abused: Not on file   Sexually Abused: Not on file    Review of Systems: Review of Systems  Constitutional: Negative for chills and fever.  HENT: Negative for hearing loss and tinnitus.   Eyes: Negative for pain and redness.  Respiratory: Negative for cough and shortness of breath.   Cardiovascular: Negative for chest pain and palpitations.  Gastrointestinal: Positive for blood in stool. Negative for abdominal pain, constipation, diarrhea, heartburn, melena, nausea and vomiting.  Genitourinary: Negative for dysuria and urgency.   Musculoskeletal: Negative for back pain and joint pain.  Skin: Negative for itching and rash.  Neurological: Negative for seizures and loss of consciousness.  Endo/Heme/Allergies: Negative for polydipsia. Bruises/bleeds easily.  Psychiatric/Behavioral: Positive for memory loss. Negative for substance abuse. The patient is not nervous/anxious.    Physical Exam: Vital signs: Vitals:   12/04/19 1430 12/04/19 1502  BP: 130/64 (!) 152/57  Pulse: 81 87  Resp: (!) 28 18  Temp:  98 F (36.7  C)  SpO2: 100% 100%     Physical Exam Vitals reviewed.  Constitutional:      General: She is not in acute distress. HENT:     Head: Normocephalic and atraumatic.     Nose: Nose normal.     Mouth/Throat:     Mouth: Mucous membranes are moist.     Pharynx: Oropharynx is clear.  Eyes:     General: No scleral icterus.    Extraocular Movements: Extraocular movements intact.     Conjunctiva/sclera: Conjunctivae normal.  Cardiovascular:     Rate and Rhythm: Normal rate and regular rhythm.     Pulses: Normal pulses.     Heart sounds: Normal heart sounds.  Pulmonary:     Effort: Pulmonary effort is normal. No respiratory distress.     Breath sounds: Normal breath sounds.  Abdominal:     General: Abdomen is flat. Bowel sounds are normal. There is no distension.     Palpations: Abdomen is soft. There is no mass.     Tenderness: There is no abdominal tenderness. There is no guarding or rebound.     Hernia: No hernia is present.  Musculoskeletal:        General: No swelling or tenderness.     Cervical back: Normal range of motion and neck supple.  Skin:    General: Skin is warm and dry.  Neurological:     General: No focal deficit present.     Mental Status: Mental status is at baseline. She is lethargic.     Comments: Able to report year and location  Psychiatric:        Mood and Affect: Mood normal.        Behavior: Behavior normal. Behavior is cooperative.     GI:  Lab Results: Recent  Labs    12/04/19 1019  WBC 7.7  HGB 10.3*  HCT 34.3*  PLT 310   BMET Recent Labs    12/04/19 1019  NA 143  K 4.1  CL 107  CO2 26  GLUCOSE 147*  BUN 21  CREATININE 1.00  CALCIUM 8.6*   LFT Recent Labs    12/04/19 1019  PROT 6.8  ALBUMIN 2.9*  AST 18  ALT 19  ALKPHOS 89  BILITOT 0.6   PT/INR Recent Labs    12/04/19 1019  LABPROT 16.8*  INR 1.4*     Studies/Results: No results found.  Impression: Rectal bleeding x1 in the setting of Eliquis use (started 12/01/2019).  Last colonoscopy in 2005 was unremarkable.  Differential diagnosis includes internal hemorrhoids, diverticulosis, polyp/mass.  Fortunately, no mass or other abnormality seen on recent noncontrast CT. -Hemoglobin stable (10.3 today as compared to 10.6 on 10/30 and 9.8 on 10/27) -BUN 21/creatinine 1.00, making upper GI bleeding and unlikely cause of bright red blood per rectum  Dementia, chronic debility (bedbound)  LUE DVT (on Eliquis, last dose yesterday or today)  Plan: Continue to hold Eliquis for now.  If no further signs of bleeding by tomorrow, we will plan to reinitiate it.  Clear liquid diet.  Repeat CBC in the morning.  Continue to monitor for signs of recurring rectal bleeding.  Transfuse as needed to maintain hemoglobin greater than 7.  If destabilizing bleeding occurs, recommend CTA and if positive, IR consult for embolization.  If continued bleeding without destabilization, recommend nuclear bleeding scan.  Due to patient being bedbound, as well as deep sacral ulcers, do not recommend proceeding with prep/colonoscopy at this time.  Continue Protonix 40  mg PO twice daily due to recent upper GI bleeding (dose should be continued for 2 months, followed by once daily dosing indefinitely).  [facility] will follow.   LOS: 0 days   Salley Slaughter  PA-C 12/04/2019, 3:30 PM  Contact #  [phone]

## 2019-12-04 NOTE — ED Provider Notes (Signed)
[facility] DEPT Provider Note   CSN: [id number] Arrival date & time: 12/04/19  0845     History Chief Complaint  Patient presents with  . Rectal Bleeding    unknown source    Ana Newman is a 84 y.o. female.   Rectal Bleeding Patient presented to ED for evaluation of possible rectal bleeding.  Patient is a resident of a nursing facility.  She is bedbound.  Primarily complains of a bedsore.  She has not noticed any blood in her stool herself but states the staff at the facility were concerned she might be having rectal bleeding.  Apparently they noted blood clots in her briefs.  Patient denies any abdominal pain.  She denies any hematemesis.  She is complaining of pain and discomfort on a bedsore that she has in her sacrum.     Past Medical History:  Diagnosis Date  . Acute pyelonephritis   . Alzheimer's disease ([place])    alzheimer's dementia   . Bilateral foot-drop   . Bladder outlet obstruction   . Cholelithiasis   . Constipation   . Depression   . Diabetes mellitus without complication ([place])    type 2   . Diabetic ulcer of right buttock ([place])   . Diarrhea   . Dysrhythmia    ? hx of afib postop 1/31-03/06/18   . Environmental and seasonal allergies   . Essential hypertension, benign   . Foley catheter in place   . GERD (gastroesophageal reflux disease)   . History of kidney stones   . History of urinary tract infection   . Hyperlipidemia   . Hypertension   . Hypothyroidism   . Incontinence    wears diaper   . Insomnia   . Nephrolithiasis   . Osteoarthritis   . Pyelonephritis   . Reflux esophagitis   . Rickets, active   . Sepsis ([place])   . Sepsis ([place])    hx of   . Unspecified constipation   . Unspecified psychosis   . Unspecified vitamin D deficiency   . Vitamin D deficiency     Patient Active Problem List   Diagnosis Date Noted  . Anemia due to blood loss 11/25/2019  . Pressure injury of skin 11/23/2019  .  Pyelonephritis 03/03/2018  . Leukocytosis 03/03/2018  . Ureterolithiasis 03/03/2018  . Late onset Alzheimer's disease without behavioral disturbance ([place]) 09/18/2017  . Major depression with psychotic features ([place]) 03/14/2017  . Weight loss, non-intentional 10/25/2016  . Chronic constipation 10/08/2016  . Hypertension associated with diabetes ([place]) 05/14/2016  . Bladder outlet obstruction 06/17/2015  . Nephrolithiasis 05/18/2015  . Sepsis due to urinary tract infection ([place]) 05/17/2015  . AKI (acute kidney injury) ([place]) 05/17/2015  . Environmental and seasonal allergies 04/06/2015  . Dyslipidemia associated with type 2 diabetes mellitus ([place]) 02/04/2015  . Diabetes mellitus, type II, insulin dependent ([place]) 02/04/2015  . Depression 01/18/2014  . Hypothyroidism 01/18/2014  . Psychosis ([place])   . Osteoarthritis     Past Surgical History:  Procedure Laterality Date  . ABDOMINAL HYSTERECTOMY    . CYSTOSCOPY W/ URETERAL STENT PLACEMENT Bilateral 05/18/2015   Procedure: CYSTOSCOPY WITH RETROGRADE PYELOGRAM/URETERAL STENT PLACEMENT;  Surgeon: Carolan Clines, MD;  Location: [facility];  Service: Urology;  Laterality: Bilateral;  . CYSTOSCOPY W/ URETERAL STENT PLACEMENT Bilateral 03/03/2018   Procedure: CYSTOSCOPY WITH RETROGRADE PYELOGRAM/URETERAL STENT PLACEMENT;  Surgeon: Franchot Gallo, MD;  Location: [facility];  Service: Urology;  Laterality: Bilateral;  . CYSTOSCOPY W/ URETERAL STENT  REMOVAL Bilateral 09/12/2015   Procedure: CYSTOSCOPY WITH STENT REMOVAL;  Surgeon: Carolan Clines, MD;  Location: [facility];  Service: Urology;  Laterality: Bilateral;  1 HOUR  . CYSTOSCOPY WITH RETROGRADE PYELOGRAM, URETEROSCOPY AND STENT PLACEMENT Bilateral 07/18/2015   Procedure: CYSTOSCOPY REMOVE LEFT DOUBLE J STENT LEFT RETROGRADE PYELOGRAM, LEFT URETEROSCOPY, PYELOSCOPY, REPLACEMENT OF LEFT DOUBLE J STENT;  Surgeon: Carolan Clines, MD;  Location: [facility];  Service: Urology;  Laterality: Bilateral;  .  CYSTOSCOPY WITH RETROGRADE PYELOGRAM, URETEROSCOPY AND STENT PLACEMENT Bilateral 06/08/2018   Procedure: CYSTOSCOPY WITH RETROGRADE PYELOGRAM, URETEROSCOPY AND STENT PLACEMENT;  Surgeon: Franchot Gallo, MD;  Location: [facility];  Service: Urology;  Laterality: Bilateral;  90 MINS  . HOLMIUM LASER APPLICATION Bilateral 8/92/1194   Procedure: HOLMIUM LASER OF LEFT LOWER URETERAL STONE, LEFT PYELOSCOPY, REPLACE LEFT DOUBLE J STENT REMOVE RIGHT DOUBLE J STENT RIGHT RETROGRADE PYELOGRAM, REPLACEMENT RIGHT DOUBLE J STENT ;  Surgeon: Carolan Clines, MD;  Location: [facility];  Service: Urology;  Laterality: Bilateral;  . HOLMIUM LASER APPLICATION Bilateral 02/08/4079   Procedure: HOLMIUM LASER APPLICATION;  Surgeon: Franchot Gallo, MD;  Location: [facility];  Service: Urology;  Laterality: Bilateral;  . KNEE ARTHROSCOPY Left 1997     OB History   No obstetric history on file.     Family History  Family history unknown: Yes    Social History   Tobacco Use  . Smoking status: Never Smoker  . Smokeless tobacco: Never Used  Substance Use Topics  . Alcohol use: No  . Drug use: No    Home Medications Prior to Admission medications   Medication Sig Start Date End Date Taking? Authorizing Provider  acetaminophen (TYLENOL) 500 MG tablet Take 1,000 mg by mouth daily. Pain management   Yes [provider]  APIXABAN (ELIQUIS) VTE STARTER PACK (10MG  AND 5MG ) Take as directed on package: start with two-5mg  tablets twice daily for 7 days. On day 8, switch to one-5mg  tablet twice daily. Patient taking differently: Take 10 mg by mouth in the morning and at bedtime.  12/01/19  Yes Hall, Carole N, DO  bisacodyl (DULCOLAX) 10 MG suppository Place 10 mg rectally daily as needed for moderate constipation.    Yes [provider]  Cholecalciferol (VITAMIN D3) 50 MCG (2000 UT) TABS Take 2,000 Units by mouth daily.   Yes [provider]  collagenase (SANTYL) ointment Apply topically daily.  12/02/19  Yes Hall, Carole N, DO  Cranberry 450 MG CAPS Take 450 mg by mouth daily.   Yes [provider]  docusate sodium (COLACE) 100 MG capsule Take 100 mg by mouth 2 (two) times daily.   Yes [provider]  furosemide (LASIX) 20 MG tablet Take 1 tablet (20 mg total) by mouth daily. 12/02/19 01/31/20 Yes Hall, Lorenda Cahill, DO  linaclotide (LINZESS) 145 MCG CAPS capsule Take 145 mcg by mouth daily before breakfast.   Yes [provider]  losartan (COZAAR) 25 MG tablet Take 1 tablet (25 mg total) by mouth daily. 12/02/19 01/31/20 Yes Kayleen Memos, DO  Multiple Vitamin (MULTIVITAMIN WITH MINERALS) TABS tablet Take 1 tablet by mouth daily.   Yes [provider]  Nutritional Supplements (PROMOD) LIQD Take 30 mLs by mouth 2 (two) times daily.   Yes [provider]  omeprazole (PRILOSEC) 20 MG capsule Take 20 mg by mouth daily.   Yes [provider]  ondansetron (ZOFRAN) 4 MG tablet Take 4 mg by mouth every 6 (six) hours as needed for nausea or vomiting.  Yes [provider]  polyethylene glycol (MIRALAX / GLYCOLAX) packet Take 17 g by mouth every 12 (twelve) hours as needed for mild constipation.   Yes [provider]  Polyvinyl Alcohol-Povidone (FRESHKOTE) 2.7-2 % SOLN Apply 1 drop to eye 3 (three) times daily. To both eyes   Yes [provider]  rosuvastatin (CRESTOR) 10 MG tablet Take 10 mg by mouth daily.   Yes [provider]  senna (SENOKOT) 8.6 MG tablet Take 2 tablets by mouth at bedtime.   Yes [provider]  traMADol (ULTRAM) 50 MG tablet Take 1 tablet (50 mg total) by mouth every 6 (six) hours as needed for up to 3 days for severe pain. 12/01/19 12/04/19 Yes Hall, Lorenda Cahill, DO  ZINC OXIDE, TOPICAL, 10 % CREA Apply 1 application topically in the morning and at bedtime.   Yes [provider]  pantoprazole (PROTONIX) 40 MG tablet Take 1 tablet (40 mg total) by mouth 2 (two) times daily  before a meal. Please take 1 tablet 40 mg twice a day for 60 days, then once a day after. Patient not taking: Reported on 12/04/2019 12/01/19 01/30/20  Kayleen Memos, DO    Allergies    5-alpha reductase inhibitors  Review of Systems   Review of Systems  Gastrointestinal: Positive for hematochezia.  All other systems reviewed and are negative.   Physical Exam Updated Vital Signs BP (!) 150/65   Pulse 74   Temp 98.9 F (37.2 C) (Oral)   Resp (!) 25   Ht 1.549 m (5\' 1" )   Wt 52.2 kg   SpO2 100%   BMI 21.73 kg/m   Physical Exam Vitals and nursing note reviewed.  Constitutional:      Appearance: She is well-developed.     Comments: Elderly, frail  HENT:     Head: Normocephalic and atraumatic.     Right Ear: External ear normal.     Left Ear: External ear normal.  Eyes:     General: No scleral icterus.       Right eye: No discharge.        Left eye: No discharge.     Conjunctiva/sclera: Conjunctivae normal.  Neck:     Trachea: No tracheal deviation.  Cardiovascular:     Rate and Rhythm: Normal rate and regular rhythm.  Pulmonary:     Effort: Pulmonary effort is normal. No respiratory distress.     Breath sounds: Normal breath sounds. No stridor. No wheezing or rales.  Abdominal:     General: Bowel sounds are normal. There is no distension.     Palpations: Abdomen is soft.     Tenderness: There is no abdominal tenderness. There is no guarding or rebound.  Genitourinary:    Comments: Rectal exam with bloody red stool, no melena, no mass appreciated Musculoskeletal:        General: No tenderness.     Cervical back: Neck supple.     Comments: Large sacral decubitus ulcer with evidence of granulation tissue and some superficial tissue necrosis at the surface of the wound,   Skin:    General: Skin is warm and dry.     Findings: No rash.  Neurological:     Mental Status: She is alert.     Cranial Nerves: No cranial nerve deficit (no facial droop, extraocular  movements intact, no slurred speech).     Sensory: No sensory deficit.     Motor: No abnormal muscle tone or seizure activity.  Coordination: Coordination normal.       ED Results / Procedures / Treatments   Labs (all labs ordered are listed, but only abnormal results are displayed) Labs Reviewed  COMPREHENSIVE METABOLIC PANEL - Abnormal; Notable for the following components:      Result Value   Glucose, Bld 147 (*)    Calcium 8.6 (*)    Albumin 2.9 (*)    GFR, Estimated 56 (*)    All other components within normal limits  CBC WITH DIFFERENTIAL/PLATELET - Abnormal; Notable for the following components:   RBC 3.45 (*)    Hemoglobin 10.3 (*)    HCT 34.3 (*)    RDW 16.9 (*)    All other components within normal limits  APTT - Abnormal; Notable for the following components:   aPTT 38 (*)    All other components within normal limits  PROTIME-INR - Abnormal; Notable for the following components:   Prothrombin Time 16.8 (*)    INR 1.4 (*)    All other components within normal limits  POC OCCULT BLOOD, ED - Abnormal; Notable for the following components:   Fecal Occult Bld POSITIVE (*)    All other components within normal limits  TYPE AND SCREEN    EKG None  Radiology No results found.  Procedures Procedures (including critical care time)  Medications Ordered in ED Medications  0.9 %  sodium chloride infusion ( Intravenous New Bag/Given 12/04/19 1024)    ED Course  I have reviewed the triage vital signs and the nursing notes.  Pertinent labs & imaging results that were available during my care of the patient were reviewed by me and considered in my medical decision making (see chart for details).  Clinical Course as of Dec 03 1201  Tue Dec 04, 2019  1104 Hemoglobin is at 10.3.  Symptoms are baseline   [JK]  8119 Metabolic panel unremarkable   [JK]    Clinical Course User Index [JK] Dorie Rank, MD   MDM Rules/Calculators/A&P                         On  exam patient appears to have a decubitus ulcer.  There appears to be some evidence of bleeding.  However patient also appears to have a GI bleed with blood noted actually on rectal exam and in her stool.  Patient noted to be on Eliquis.  Dynamically stable on initial presentation.  Will proceed with GI bleed work-up.  Dressing applied to decubitus ulcer.   Patient stool was guaiac positive.  She had visible blood in her stool on exam.  The symptoms are consistent with a lower GI bleed.  No evidence of active bleeding at this time to require rapid reversal of her Eliquis.  Prior records reviewed.  Patient was having possible GI bleeding during her recent hospitalization.  Patient was seen by [facility].  I will consult the GI today.  Plan admission to the hospital for further treatment and evaluation.  I will consult with the hospitalist service. Final Clinical Impression(s) / ED Diagnoses Final diagnoses:  Gastrointestinal hemorrhage, unspecified gastrointestinal hemorrhage type  Pressure injury of skin of sacral region, unspecified injury stage     Dorie Rank, MD 12/04/19 1203
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
## 2019-12-04 NOTE — Consult Note (Addendum)
[facility] Nurse Consult Note: Pt is familiar to [facility] team from recent admission; refer to progress notes from 10/26.  Consult performed remotely after review of progress notes and photos in the EMR. Pt has multiple systemic factors which can impair healing. It is difficult to keep wounds from becoming soiled since she is frequently incontinent of liquid stools and this becomes trapped underneath the dressings related to the close location to the rectum.  Reason for Consult: Consult requested for sacrum/buttocks. Wound type: Left upper buttock with unstageable wound; wounds are located close to the sacrum and have evolved to patchy areas of full thickness skin loss separated by narrow islands of skin Right upper buttock with unstageable wound;  Right lower buttock with stage 3 pressure injury in patchy areas; wounds are located close to the sacrum and have evolved to patchy areas of full thickness skin loss separated by narrow islands of skin. Pressure Injury POA: Yes Dressing procedure/placement/frequency: Air mattress ordered to reduce pressure. Topical treatment orders provided for bedside nurses to perform as follows: Apply Santyl to buttocks/sacrum wounds Q day, then cover with moist gauze dressings and foam dressings.  (Change foam dressings Q 3 days or PRN soiling.) Please re-consult if further assistance is needed.  Thank-you,  Julien Girt MSN, [place], [place], [place], [place]

## 2019-12-05 DIAGNOSIS — K625 Hemorrhage of anus and rectum: Secondary | ICD-10-CM | POA: Diagnosis not present

## 2019-12-05 DIAGNOSIS — G309 Alzheimer's disease, unspecified: Secondary | ICD-10-CM | POA: Diagnosis present

## 2019-12-05 DIAGNOSIS — K449 Diaphragmatic hernia without obstruction or gangrene: Secondary | ICD-10-CM | POA: Diagnosis present

## 2019-12-05 DIAGNOSIS — Z20822 Contact with and (suspected) exposure to covid-19: Secondary | ICD-10-CM | POA: Diagnosis present

## 2019-12-05 DIAGNOSIS — R1084 Generalized abdominal pain: Secondary | ICD-10-CM | POA: Diagnosis not present

## 2019-12-05 DIAGNOSIS — Z87440 Personal history of urinary (tract) infections: Secondary | ICD-10-CM | POA: Diagnosis not present

## 2019-12-05 DIAGNOSIS — Z7901 Long term (current) use of anticoagulants: Secondary | ICD-10-CM | POA: Diagnosis not present

## 2019-12-05 DIAGNOSIS — K626 Ulcer of anus and rectum: Secondary | ICD-10-CM | POA: Diagnosis present

## 2019-12-05 DIAGNOSIS — E1159 Type 2 diabetes mellitus with other circulatory complications: Secondary | ICD-10-CM | POA: Diagnosis present

## 2019-12-05 DIAGNOSIS — K4090 Unilateral inguinal hernia, without obstruction or gangrene, not specified as recurrent: Secondary | ICD-10-CM | POA: Diagnosis not present

## 2019-12-05 DIAGNOSIS — Z79899 Other long term (current) drug therapy: Secondary | ICD-10-CM | POA: Diagnosis not present

## 2019-12-05 DIAGNOSIS — K922 Gastrointestinal hemorrhage, unspecified: Secondary | ICD-10-CM | POA: Diagnosis present

## 2019-12-05 DIAGNOSIS — D62 Acute posthemorrhagic anemia: Secondary | ICD-10-CM | POA: Diagnosis present

## 2019-12-05 DIAGNOSIS — K5904 Chronic idiopathic constipation: Secondary | ICD-10-CM | POA: Diagnosis present

## 2019-12-05 DIAGNOSIS — I9589 Other hypotension: Secondary | ICD-10-CM | POA: Diagnosis not present

## 2019-12-05 DIAGNOSIS — K2970 Gastritis, unspecified, without bleeding: Secondary | ICD-10-CM | POA: Diagnosis present

## 2019-12-05 DIAGNOSIS — K633 Ulcer of intestine: Secondary | ICD-10-CM | POA: Diagnosis not present

## 2019-12-05 DIAGNOSIS — M2550 Pain in unspecified joint: Secondary | ICD-10-CM | POA: Diagnosis not present

## 2019-12-05 DIAGNOSIS — I82622 Acute embolism and thrombosis of deep veins of left upper extremity: Secondary | ICD-10-CM | POA: Diagnosis present

## 2019-12-05 DIAGNOSIS — Z86718 Personal history of other venous thrombosis and embolism: Secondary | ICD-10-CM | POA: Diagnosis not present

## 2019-12-05 DIAGNOSIS — E559 Vitamin D deficiency, unspecified: Secondary | ICD-10-CM | POA: Diagnosis present

## 2019-12-05 DIAGNOSIS — R5381 Other malaise: Secondary | ICD-10-CM | POA: Diagnosis present

## 2019-12-05 DIAGNOSIS — D124 Benign neoplasm of descending colon: Secondary | ICD-10-CM | POA: Diagnosis not present

## 2019-12-05 DIAGNOSIS — N319 Neuromuscular dysfunction of bladder, unspecified: Secondary | ICD-10-CM | POA: Diagnosis present

## 2019-12-05 DIAGNOSIS — K635 Polyp of colon: Secondary | ICD-10-CM | POA: Diagnosis not present

## 2019-12-05 DIAGNOSIS — L89313 Pressure ulcer of right buttock, stage 3: Secondary | ICD-10-CM | POA: Diagnosis present

## 2019-12-05 DIAGNOSIS — I152 Hypertension secondary to endocrine disorders: Secondary | ICD-10-CM | POA: Diagnosis present

## 2019-12-05 DIAGNOSIS — K6289 Other specified diseases of anus and rectum: Secondary | ICD-10-CM | POA: Diagnosis not present

## 2019-12-05 DIAGNOSIS — D649 Anemia, unspecified: Secondary | ICD-10-CM | POA: Diagnosis present

## 2019-12-05 DIAGNOSIS — R52 Pain, unspecified: Secondary | ICD-10-CM | POA: Diagnosis not present

## 2019-12-05 DIAGNOSIS — D122 Benign neoplasm of ascending colon: Secondary | ICD-10-CM | POA: Diagnosis present

## 2019-12-05 DIAGNOSIS — K821 Hydrops of gallbladder: Secondary | ICD-10-CM | POA: Diagnosis not present

## 2019-12-05 DIAGNOSIS — N200 Calculus of kidney: Secondary | ICD-10-CM | POA: Diagnosis present

## 2019-12-05 DIAGNOSIS — K219 Gastro-esophageal reflux disease without esophagitis: Secondary | ICD-10-CM | POA: Diagnosis present

## 2019-12-05 DIAGNOSIS — E1169 Type 2 diabetes mellitus with other specified complication: Secondary | ICD-10-CM | POA: Diagnosis present

## 2019-12-05 DIAGNOSIS — Z960 Presence of urogenital implants: Secondary | ICD-10-CM | POA: Diagnosis present

## 2019-12-05 DIAGNOSIS — F0280 Dementia in other diseases classified elsewhere without behavioral disturbance: Secondary | ICD-10-CM | POA: Diagnosis present

## 2019-12-05 DIAGNOSIS — I10 Essential (primary) hypertension: Secondary | ICD-10-CM | POA: Diagnosis present

## 2019-12-05 DIAGNOSIS — E039 Hypothyroidism, unspecified: Secondary | ICD-10-CM | POA: Diagnosis present

## 2019-12-05 DIAGNOSIS — F333 Major depressive disorder, recurrent, severe with psychotic symptoms: Secondary | ICD-10-CM | POA: Diagnosis present

## 2019-12-05 DIAGNOSIS — I5030 Unspecified diastolic (congestive) heart failure: Secondary | ICD-10-CM | POA: Diagnosis present

## 2019-12-05 DIAGNOSIS — Z90710 Acquired absence of both cervix and uterus: Secondary | ICD-10-CM | POA: Diagnosis not present

## 2019-12-05 DIAGNOSIS — Z87442 Personal history of urinary calculi: Secondary | ICD-10-CM | POA: Diagnosis not present

## 2019-12-05 DIAGNOSIS — Z7401 Bed confinement status: Secondary | ICD-10-CM | POA: Diagnosis not present

## 2019-12-05 DIAGNOSIS — L89159 Pressure ulcer of sacral region, unspecified stage: Secondary | ICD-10-CM | POA: Diagnosis present

## 2019-12-05 DIAGNOSIS — M1991 Primary osteoarthritis, unspecified site: Secondary | ICD-10-CM | POA: Diagnosis present

## 2019-12-05 DIAGNOSIS — E785 Hyperlipidemia, unspecified: Secondary | ICD-10-CM | POA: Diagnosis present

## 2019-12-05 DIAGNOSIS — R32 Unspecified urinary incontinence: Secondary | ICD-10-CM | POA: Diagnosis present

## 2019-12-05 DIAGNOSIS — L89899 Pressure ulcer of other site, unspecified stage: Secondary | ICD-10-CM | POA: Diagnosis not present

## 2019-12-05 DIAGNOSIS — R58 Hemorrhage, not elsewhere classified: Secondary | ICD-10-CM | POA: Diagnosis not present

## 2019-12-05 DIAGNOSIS — K222 Esophageal obstruction: Secondary | ICD-10-CM | POA: Diagnosis not present

## 2019-12-05 DIAGNOSIS — E861 Hypovolemia: Secondary | ICD-10-CM | POA: Diagnosis not present

## 2019-12-05 LAB — CBC
HCT: 30.8 % — ABNORMAL LOW (ref 36.0–46.0)
Hemoglobin: 9.5 g/dL — ABNORMAL LOW (ref 12.0–15.0)
MCH: 29.9 pg (ref 26.0–34.0)
MCHC: 30.8 g/dL (ref 30.0–36.0)
MCV: 96.9 fL (ref 80.0–100.0)
Platelets: 268 10*3/uL (ref 150–400)
RBC: 3.18 MIL/uL — ABNORMAL LOW (ref 3.87–5.11)
RDW: 16.6 % — ABNORMAL HIGH (ref 11.5–15.5)
WBC: 7.3 10*3/uL (ref 4.0–10.5)
nRBC: 0 % (ref 0.0–0.2)

## 2019-12-05 LAB — BASIC METABOLIC PANEL
Anion gap: 9 (ref 5–15)
BUN: 17 mg/dL (ref 8–23)
CO2: 22 mmol/L (ref 22–32)
Calcium: 8.1 mg/dL — ABNORMAL LOW (ref 8.9–10.3)
Chloride: 107 mmol/L (ref 98–111)
Creatinine, Ser: 0.7 mg/dL (ref 0.44–1.00)
GFR, Estimated: >60 mL/min (ref >60)
Glucose, Bld: 64 mg/dL — ABNORMAL LOW (ref 70–99)
Potassium: 4.1 mmol/L (ref 3.5–5.1)
Sodium: 138 mmol/L (ref 135–145)

## 2019-12-05 MED ORDER — APIXABAN 5 MG PO TABS
5.0000 mg | ORAL_TABLET | Freq: Two times a day (BID) | ORAL | Status: DC
  Administered 2019-12-05 (×2): 5 mg via ORAL
  Filled 2019-12-05 (×2): qty 1
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
## 2019-12-05 NOTE — Progress Notes (Signed)
[facility] Gastroenterology Progress Note  Ana Newman 84 y.o. Jun 22, 1935  CC:   ? Rectal bleeding    Subjective: Patient seen and examined at bedside.  Discussed with RN.  No bleeding episodes noted since admission.  Patient also denies seeing any blood in the stool.  She denies abdominal pain, nausea or vomiting.  ROS : Afebrile.  Negative for chest pain   Objective: Vital signs in last 24 hours: Vitals:   12/04/19 2257 12/05/19 0315  BP: (!) 157/70 (!) 147/63  Pulse: 85 88  Resp: (!) 21 (!) 21  Temp: 98 F (36.7 C) 98.3 F (36.8 C)  SpO2: 95% 97%    Physical Exam:  General:   Elderly patient, not in acute distress, resting comfortably in the bed  Head:  Normocephalic, without obvious abnormality, atraumatic  Eyes:  , EOM's intact,   Lungs:    No visible respiratory distress noted  Heart:  Regular rate and rhythm, S1, S2 normal  Abdomen:   Soft, non-tender, nondistended, bowel sounds present, no peritoneal signs  Psych  mood and affect normal       Lab Results: Recent Labs    12/04/19 1019 12/05/19 0402  NA 143 138  K 4.1 4.1  CL 107 107  CO2 26 22  GLUCOSE 147* 64*  BUN 21 17  CREATININE 1.00 0.70  CALCIUM 8.6* 8.1*   Recent Labs    12/04/19 1019  AST 18  ALT 19  ALKPHOS 89  BILITOT 0.6  PROT 6.8  ALBUMIN 2.9*   Recent Labs    12/04/19 1019 12/05/19 0402  WBC 7.7 7.3  NEUTROABS 4.6  --   HGB 10.3* 9.5*  HCT 34.3* 30.8*  MCV 99.4 96.9  PLT 310 268   Recent Labs    12/04/19 1019  LABPROT 16.8*  INR 1.4*      Assessment/Plan: -Questionable rectal bleeding.  No further episodes since admission.  Hemoglobin stable. -History of left upper extremity DVT.  Eliquis currently on hold. -Bedbound with chronic disability  Recommendations -------------------------- -Okay to resume anticoagulation from GI standpoint given lack of any further bleeding episodes.  -Continue twice daily PPI for now -Advance diet to soft and further advance as  tolerated -GI will sign off.  Call us back if needed   Otis Brace MD, [place] 12/05/2019, 11:21 AM  Contact #  [phone]

## 2019-12-06 ENCOUNTER — Inpatient Hospital Stay (HOSPITAL_COMMUNITY): Payer: Medicare Other

## 2019-12-06 DIAGNOSIS — I9589 Other hypotension: Secondary | ICD-10-CM

## 2019-12-06 DIAGNOSIS — E861 Hypovolemia: Secondary | ICD-10-CM

## 2019-12-06 DIAGNOSIS — L89159 Pressure ulcer of sacral region, unspecified stage: Secondary | ICD-10-CM

## 2019-12-06 LAB — CBC
HCT: 26.2 % — ABNORMAL LOW (ref 36.0–46.0)
HCT: 30.4 % — ABNORMAL LOW (ref 36.0–46.0)
HCT: 32.8 % — ABNORMAL LOW (ref 36.0–46.0)
Hemoglobin: 10 g/dL — ABNORMAL LOW (ref 12.0–15.0)
Hemoglobin: 10.7 g/dL — ABNORMAL LOW (ref 12.0–15.0)
Hemoglobin: 7.8 g/dL — ABNORMAL LOW (ref 12.0–15.0)
MCH: 30.1 pg (ref 26.0–34.0)
MCH: 30.7 pg (ref 26.0–34.0)
MCH: 31 pg (ref 26.0–34.0)
MCHC: 29.8 g/dL — ABNORMAL LOW (ref 30.0–36.0)
MCHC: 32.6 g/dL (ref 30.0–36.0)
MCHC: 32.9 g/dL (ref 30.0–36.0)
MCV: 101.2 fL — ABNORMAL HIGH (ref 80.0–100.0)
MCV: 93.3 fL (ref 80.0–100.0)
MCV: 95.1 fL (ref 80.0–100.0)
Platelets: 211 10*3/uL (ref 150–400)
Platelets: 211 10*3/uL (ref 150–400)
Platelets: 290 10*3/uL (ref 150–400)
RBC: 2.59 MIL/uL — ABNORMAL LOW (ref 3.87–5.11)
RBC: 3.26 MIL/uL — ABNORMAL LOW (ref 3.87–5.11)
RBC: 3.45 MIL/uL — ABNORMAL LOW (ref 3.87–5.11)
RDW: 16.5 % — ABNORMAL HIGH (ref 11.5–15.5)
RDW: 17 % — ABNORMAL HIGH (ref 11.5–15.5)
RDW: 17.3 % — ABNORMAL HIGH (ref 11.5–15.5)
WBC: 11.2 10*3/uL — ABNORMAL HIGH (ref 4.0–10.5)
WBC: 8.4 10*3/uL (ref 4.0–10.5)
WBC: 9 10*3/uL (ref 4.0–10.5)
nRBC: 0 % (ref 0.0–0.2)
nRBC: 0 % (ref 0.0–0.2)
nRBC: 0 % (ref 0.0–0.2)

## 2019-12-06 LAB — PREPARE RBC (CROSSMATCH)

## 2019-12-06 LAB — BASIC METABOLIC PANEL
Anion gap: 11 (ref 5–15)
BUN: 21 mg/dL (ref 8–23)
CO2: 20 mmol/L — ABNORMAL LOW (ref 22–32)
Calcium: 7.8 mg/dL — ABNORMAL LOW (ref 8.9–10.3)
Chloride: 105 mmol/L (ref 98–111)
Creatinine, Ser: 1.06 mg/dL — ABNORMAL HIGH (ref 0.44–1.00)
GFR, Estimated: 52 mL/min — ABNORMAL LOW (ref >60)
Glucose, Bld: 100 mg/dL — ABNORMAL HIGH (ref 70–99)
Potassium: 4.5 mmol/L (ref 3.5–5.1)
Sodium: 136 mmol/L (ref 135–145)

## 2019-12-06 MED ORDER — IOHEXOL 350 MG/ML SOLN
100.0000 mL | Freq: Once | INTRAVENOUS | Status: AC | PRN
  Administered 2019-12-06: 100 mL via INTRAVENOUS

## 2019-12-06 MED ORDER — ONDANSETRON HCL 4 MG/2ML IJ SOLN: 4.0000 mg | Freq: Three times a day (TID) | INTRAMUSCULAR | Status: DC | PRN

## 2019-12-06 MED ORDER — LACTATED RINGERS IV BOLUS
500.0000 mL | Freq: Once | INTRAVENOUS | Status: AC
  Administered 2019-12-06: 500 mL via INTRAVENOUS

## 2019-12-06 MED ORDER — PROCHLORPERAZINE EDISYLATE 10 MG/2ML IJ SOLN
10.0000 mg | Freq: Four times a day (QID) | INTRAMUSCULAR | Status: DC | PRN
  Administered 2019-12-06: 10 mg via INTRAVENOUS
  Filled 2019-12-06: qty 2

## 2019-12-06 MED ORDER — IOHEXOL 350 MG/ML SOLN: 100.0000 mL | Freq: Once | INTRAVENOUS | Status: DC | PRN

## 2019-12-06 MED ORDER — ONDANSETRON HCL 4 MG/2ML IJ SOLN
INTRAMUSCULAR | Status: AC
  Filled 2019-12-06: qty 2

## 2019-12-06 MED ORDER — SODIUM CHLORIDE 0.9% IV SOLUTION: Freq: Once | INTRAVENOUS | Status: DC

## 2019-12-06 MED ORDER — PEG 3350-KCL-NA BICARB-NACL 420 G PO SOLR
4000.0000 mL | Freq: Once | ORAL | Status: AC
  Administered 2019-12-06: 4000 mL via ORAL
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
## 2019-12-06 NOTE — Progress Notes (Signed)
Patient in yellow mews with a unit of prbc infusing at start of shift

## 2019-12-06 NOTE — Progress Notes (Signed)
Patient has drank a little over 1/2 of her prep for tomorrow's procedure.  She is having a large amount of bright red blood per rectum. Pt does report feeling slightly dizzy at this time. Vitals remain stable. MD notified.Roderick Pee

## 2019-12-06 NOTE — Progress Notes (Signed)
Patient's HGB actually increased to 10.7. Will continue to monitor patient at this time. Patient has 2 cups left until prep complete.Ana Newman

## 2019-12-06 NOTE — Progress Notes (Signed)
12/06/19 0500  Assess: MEWS Score  Temp 98.3 F (36.8 C)  BP (!) 76/39  Pulse Rate (!) 113  Resp (!) 24  SpO2 100 %  O2 Device Nasal Cannula  O2 Flow Rate (L/min) 3 L/min  Assess: MEWS Score  MEWS Temp 0  MEWS Systolic 2  MEWS Pulse 2  MEWS RR 1  MEWS LOC 0  MEWS Score 5  MEWS Score Color Red  Assess: if the MEWS score is Yellow or Red  Were vital signs taken at a resting state? Yes  Focused Assessment Change from prior assessment (see assessment flowsheet)  Early Detection of Sepsis Score *See Row Information* Low  MEWS guidelines implemented *See Row Information* Yes  Treat  MEWS Interventions Escalated (See documentation below);Other (Comment) (Care now being administered by RN)  Take Vital Signs  Increase Vital Sign Frequency  Red: Q 1hr X 4 then Q 4hr X 4, if remains red, continue Q 4hrs  Escalate  MEWS: Escalate Red: discuss with charge nurse/RN and provider, consider discussing with RRT  Notify: Charge Nurse/RN  Name of Charge Nurse/RN Notified Debbie RN  Date Charge Nurse/RN Notified 12/06/19  Time Charge Nurse/RN Notified 0500  Notify: Provider  Provider Name/Title M. Sharlet Salina   Date Provider Notified 12/06/19  Time Provider Notified 0500  Notification Type Page  Notification Reason Change in status (low BP)  Response See new orders  Date of Provider Response 12/06/19  Time of Provider Response 0500  Notify: Rapid Response  Name of Rapid Response RN Notified Alexis, RN   Date Rapid Response Notified 12/06/19  Time Rapid Response Notified 0522  Document  Patient Outcome Other (Comment) (resting with RN by bedside)  Progress note created (see row info) Yes     12/06/19 0500  Assess: MEWS Score  Temp 98.3 F (36.8 C)  BP (!) 76/39  Pulse Rate (!) 113  Resp (!) 24  SpO2 100 %  O2 Device Nasal Cannula  O2 Flow Rate (L/min) 3 L/min  Assess: MEWS Score  MEWS Temp 0  MEWS Systolic 2  MEWS Pulse 2  MEWS RR 1  MEWS LOC 0  MEWS Score 5  MEWS Score  Color Red  Assess: if the MEWS score is Yellow or Red  Were vital signs taken at a resting state? Yes  Focused Assessment Change from prior assessment (see assessment flowsheet)  Early Detection of Sepsis Score *See Row Information* Low  MEWS guidelines implemented *See Row Information* Yes  Treat  MEWS Interventions Escalated (See documentation below);Other (Comment) (Care now being administered by RN)  Take Vital Signs  Increase Vital Sign Frequency  Red: Q 1hr X 4 then Q 4hr X 4, if remains red, continue Q 4hrs  Escalate  MEWS: Escalate Red: discuss with charge nurse/RN and provider, consider discussing with RRT  Notify: Charge Nurse/RN  Name of Charge Nurse/RN Notified Debbie RN  Date Charge Nurse/RN Notified 12/06/19  Time Charge Nurse/RN Notified 0500  Notify: Provider  Provider Name/Title M. Sharlet Salina   Date Provider Notified 12/06/19  Time Provider Notified 0500  Notification Type Page  Notification Reason Change in status (low BP)  Response See new orders  Date of Provider Response 12/06/19  Time of Provider Response 0500  Notify: Rapid Response  Name of Rapid Response RN Notified Ubaldo Glassing, RN   Date Rapid Response Notified 12/06/19  Time Rapid Response Notified 0522  Document  Patient Outcome Other (Comment) (resting with RN by bedside)  Progress note created (see row  info) Yes  RRT  Notified , no new orders from [employer] as the [facility] NP Ardith Dark has provided orders

## 2019-12-06 NOTE — Progress Notes (Signed)
Order to give 553mL Bolus of LR.

## 2019-12-06 NOTE — Progress Notes (Signed)
Patient noted to have large amount of blood from her rectum at this time.  MD contacted via secure chat.  Patient prepping for EGD tomorrow

## 2019-12-06 NOTE — Progress Notes (Signed)
   12/06/19 0404  Assess: MEWS Score  Temp 98 F (36.7 C)  BP (!) 124/97  Pulse Rate (!) 153  Resp (!) 32  SpO2 94 %  O2 Device Room Air  Assess: MEWS Score  MEWS Temp 0  MEWS Systolic 0  MEWS Pulse 3  MEWS RR 2  MEWS LOC 0  MEWS Score 5  MEWS Score Color Red  Assess: if the MEWS score is Yellow or Red  Were vital signs taken at a resting state? No (patient nauseous and in pain)  Focused Assessment No change from prior assessment  Early Detection of Sepsis Score *See Row Information* Low  MEWS guidelines implemented *See Row Information* Yes  Treat  MEWS Interventions Escalated (See documentation below);Administered prn meds/treatments  Pain Scale 0-10  Pain Score 10  Take Vital Signs  Increase Vital Sign Frequency  Red: Q 1hr X 4 then Q 4hr X 4, if remains red, continue Q 4hrs  Escalate  MEWS: Escalate Red: discuss with charge nurse/RN and provider, consider discussing with RRT  Notify: Charge Nurse/RN  Name of Charge Nurse/RN Notified Debbie RN  Date Charge Nurse/RN Notified 12/06/19  Time Charge Nurse/RN Notified 0404  Notify: Provider  Provider Name/Title M. Sharlet Salina  Date Provider Notified 12/06/19  Time Provider Notified [phone]  Notification Type Face-to-face  Notification Reason Other (Comment) (patient having active rectal bleeding)  Response See new orders  Date of Provider Response 12/06/19  Time of Provider Response 0404  Document  Progress note created (see row info) Yes   NT notified this nurse that patient had blood in the bed. This nurse went into patients room and found the patient having an active rectal bleed. Notified charge nurse Aurelio Brash and on-call attending M. Sharlet Salina who was on the unit. Care was being administered by this nurse, charge nurse, another RN on the unit. Pt complaining of nausea and pain. BP 124/97, P 153, R 32 and Red Mews. Order to give compazine 10mg  IV and Morphine 2mg  IV which was given by Linton Ham RN. On-call attending M. Denny at  bedside while meds being administered. Order to transfuse 2 units PRBC's awaiting for the units to be released. Patient O2 94% with 2L of O2 via [place]. BP is now 76-39. HR of 113. Marcela RN remain at bedside with this nurse. Patient is alert and able to make needs known at her baseline. Will continue to monitor the patient.

## 2019-12-06 NOTE — Progress Notes (Signed)
I have assumed care of this patient due to RED MEWS . Pt is alert, LR bolus infusing , foot of bed elevated d/t low bp

## 2019-12-06 NOTE — Progress Notes (Signed)
Ana Newman notified via AMION BP 64/43. Awaiting orders.

## 2019-12-06 NOTE — Progress Notes (Signed)
   12/06/19 0500  Assess: MEWS Score  Temp 98.3 F (36.8 C)  BP (!) 76/39  Pulse Rate (!) 113  Resp (!) 24  SpO2 100 %  O2 Device Nasal Cannula  O2 Flow Rate (L/min) 3 L/min  Assess: MEWS Score  MEWS Temp 0  MEWS Systolic 2  MEWS Pulse 2  MEWS RR 1  MEWS LOC 0  MEWS Score 5  MEWS Score Color Red  Assess: if the MEWS score is Yellow or Red  Were vital signs taken at a resting state? Yes  Focused Assessment Change from prior assessment (see assessment flowsheet)  Early Detection of Sepsis Score *See Row Information* Low  MEWS guidelines implemented *See Row Information* Yes  Treat  MEWS Interventions Escalated (See documentation below);Other (Comment) (Care now being administered by RN)  Take Vital Signs  Increase Vital Sign Frequency  Red: Q 1hr X 4 then Q 4hr X 4, if remains red, continue Q 4hrs  Escalate  MEWS: Escalate Red: discuss with charge nurse/RN and provider, consider discussing with RRT  Notify: Charge Nurse/RN  Name of Charge Nurse/RN Notified Debbie RN  Date Charge Nurse/RN Notified 12/06/19  Time Charge Nurse/RN Notified 0500  Notify: Provider  Provider Name/Title M. Sharlet Salina   Date Provider Notified 12/06/19  Time Provider Notified 0500  Notification Type Page  Notification Reason Change in status (low BP)  Response See new orders  Date of Provider Response 12/06/19  Time of Provider Response 0500  Document  Patient Outcome Other (Comment) (resting with RN by bedside)  Progress note created (see row info) Yes   Patient is alert and verbal, 575mL Bolus of LR being administered.

## 2019-12-06 NOTE — Progress Notes (Addendum)
NT notified this nurse that patient had blood in the bed. This nurse went into patients room and found the patient having an active rectal bleed. Notified charge nurse Aurelio Brash and on-call attending M. Sharlet Salina who was on the unit. Care was being administered by this nurse, charge nurse, another RN on the unit. Pt complaining of nausea and pain. BP 124/97, P 153, R 32 and Red Mews. Order to give compazine 10mg  IV and Morphine 2mg  IV which was given by Linton Ham RN. On-call attending M. Denny at bedside while meds being administered. Order to transfuse 2 units PRBC's awaiting for the units to be released. Patient O2 94% with 2L of O2 via [place]. BP is now 76-39. HR of 113. Marcela RN remain at bedside with this nurse. Patient is alert and able to make needs known at her baseline. Will continue to monitor the patient.

## 2019-12-06 NOTE — Progress Notes (Signed)
[facility] Gastroenterology Progress Note  Ana Newman 84 y.o. August 01, 1935  CC:   ? Rectal bleeding    Subjective: Patient seen and examined at bedside.  Events of early morning noted.  Patient had episodes of rectal bleeding leading to dropping blood pressure in hemoglobin.  She has received 1 unit of blood transfusion so far.  Vitals have stabilized now.  ROS : Afebrile.  Negative for chest pain   Objective: Vital signs in last 24 hours: Vitals:   12/06/19 0741 12/06/19 0830  BP: (!) 118/94 (!) 104/42  Pulse: 98 87  Resp: (!) 22 20  Temp: 98.4 F (36.9 C) 98.4 F (36.9 C)  SpO2: 100% 100%    Physical Exam:  General:   Elderly patient, not in acute distress, resting comfortably in the bed  Head:  Normocephalic, without obvious abnormality, atraumatic  Eyes:  , EOM's intact,   Lungs:    No visible respiratory distress noted  Heart:  Regular rate and rhythm, S1, S2 normal  Abdomen:   Soft, non-tender, nondistended, bowel sounds present, no peritoneal signs  Psych  mood and affect normal  Rectal  no external hemorrhoids seen.  No rectal mass.  Maroon-colored blood started coming out of rectum after rectal exam.    Lab Results: Recent Labs    12/05/19 0402 12/06/19 0410  NA 138 136  K 4.1 4.5  CL 107 105  CO2 22 20*  GLUCOSE 64* 100*  BUN 17 21  CREATININE 0.70 1.06*  CALCIUM 8.1* 7.8*   Recent Labs    12/04/19 1019  AST 18  ALT 19  ALKPHOS 89  BILITOT 0.6  PROT 6.8  ALBUMIN 2.9*   Recent Labs    12/04/19 1019 12/04/19 1019 12/05/19 0402 12/06/19 0410  WBC 7.7   < > 7.3 11.2*  NEUTROABS 4.6  --   --   --   HGB 10.3*   < > 9.5* 7.8*  HCT 34.3*   < > 30.8* 26.2*  MCV 99.4   < > 96.9 101.2*  PLT 310   < > 268 290   < > = values in this interval not displayed.   Recent Labs    12/04/19 1019  LABPROT 16.8*  INR 1.4*      Assessment/Plan: -Painless rectal bleeding.  Most likely diverticular bleed. -Acute blood loss anemia -History of  coffee-ground emesis few weeks ago. -History of left upper extremity DVT.  Eliquis was restarted yesterday because of lack of further bleeding and stability of hemoglobin..  Last dose yesterday night.  Currently on hold. -Bedbound with chronic disability  Recommendations -------------------------- -Patient started having rectal bleeding after resuming anticoagulation yesterday. -Transfuse as needed to keep hemoglobin around eight. -Get CT Angio abdomen and pelvis rule out active bleeding.  If positive, recommend IR consult for embolization.  If negative, will consider EGD and colonoscopy tomorrow. -May need to reconsider use of anticoagulation if ongoing bleeding. -Discussed with patient's daughter.   Otis Brace MD, [place] 12/06/2019, 9:54 AM  Contact #  [phone]

## 2019-12-07 ENCOUNTER — Encounter (HOSPITAL_COMMUNITY)
Admission: EM | Disposition: A | Discharge status: Skilled Nursing Facility | Payer: Self-pay | Source: Skilled Nursing Facility | Attending: Internal Medicine

## 2019-12-07 ENCOUNTER — Inpatient Hospital Stay (HOSPITAL_COMMUNITY): Payer: Medicare Other | Admitting: Certified Registered Nurse Anesthetist

## 2019-12-07 DIAGNOSIS — E1159 Type 2 diabetes mellitus with other circulatory complications: Secondary | ICD-10-CM

## 2019-12-07 DIAGNOSIS — I152 Hypertension secondary to endocrine disorders: Secondary | ICD-10-CM

## 2019-12-07 HISTORY — PX: POLYPECTOMY: SHX5525

## 2019-12-07 HISTORY — PX: HEMOSTASIS CLIP PLACEMENT: SHX6857

## 2019-12-07 HISTORY — PX: BIOPSY: SHX5522

## 2019-12-07 HISTORY — PX: ESOPHAGOGASTRODUODENOSCOPY (EGD) WITH PROPOFOL: SHX5813

## 2019-12-07 HISTORY — PX: COLONOSCOPY WITH PROPOFOL: SHX5780

## 2019-12-07 LAB — CBC
HCT: 32 % — ABNORMAL LOW (ref 36.0–46.0)
Hemoglobin: 10.2 g/dL — ABNORMAL LOW (ref 12.0–15.0)
MCH: 30.6 pg (ref 26.0–34.0)
MCHC: 31.9 g/dL (ref 30.0–36.0)
MCV: 96.1 fL (ref 80.0–100.0)
Platelets: 217 10*3/uL (ref 150–400)
RBC: 3.33 MIL/uL — ABNORMAL LOW (ref 3.87–5.11)
RDW: 17.2 % — ABNORMAL HIGH (ref 11.5–15.5)
WBC: 7.1 10*3/uL (ref 4.0–10.5)
nRBC: 0 % (ref 0.0–0.2)

## 2019-12-07 LAB — COMPREHENSIVE METABOLIC PANEL
ALT: 15 U/L (ref 0–44)
AST: 18 U/L (ref 15–41)
Albumin: 2.4 g/dL — ABNORMAL LOW (ref 3.5–5.0)
Alkaline Phosphatase: 70 U/L (ref 38–126)
Anion gap: 9 (ref 5–15)
BUN: 19 mg/dL (ref 8–23)
CO2: 20 mmol/L — ABNORMAL LOW (ref 22–32)
Calcium: 7.8 mg/dL — ABNORMAL LOW (ref 8.9–10.3)
Chloride: 107 mmol/L (ref 98–111)
Creatinine, Ser: 0.96 mg/dL (ref 0.44–1.00)
GFR, Estimated: 59 mL/min — ABNORMAL LOW (ref >60)
Glucose, Bld: 80 mg/dL (ref 70–99)
Potassium: 4.8 mmol/L (ref 3.5–5.1)
Sodium: 136 mmol/L (ref 135–145)
Total Bilirubin: 0.5 mg/dL (ref 0.3–1.2)
Total Protein: 5.5 g/dL — ABNORMAL LOW (ref 6.5–8.1)

## 2019-12-07 SURGERY — ESOPHAGOGASTRODUODENOSCOPY (EGD) WITH PROPOFOL
Anesthesia: Monitor Anesthesia Care

## 2019-12-07 MED ORDER — LACTATED RINGERS IV SOLN: INTRAVENOUS | Status: DC

## 2019-12-07 MED ORDER — PROPOFOL 500 MG/50ML IV EMUL
INTRAVENOUS | Status: DC | PRN
  Administered 2019-12-07: 50 mg via INTRAVENOUS
  Administered 2019-12-07: 50 ug/kg/min via INTRAVENOUS

## 2019-12-07 MED ORDER — PHENYLEPHRINE 40 MCG/ML (10ML) SYRINGE FOR IV PUSH (FOR BLOOD PRESSURE SUPPORT)
PREFILLED_SYRINGE | INTRAVENOUS | Status: DC | PRN
  Administered 2019-12-07 (×2): 80 ug via INTRAVENOUS

## 2019-12-07 MED ORDER — SODIUM CHLORIDE 0.9 % IV SOLN: INTRAVENOUS | Status: DC

## 2019-12-07 SURGICAL SUPPLY — 25 items

## 2019-12-07 NOTE — Brief Op Note (Signed)
12/04/2019 - 12/07/2019  9:25 AM  PATIENT:  Ana Newman  84 y.o. female  PRE-OPERATIVE DIAGNOSIS:  GI bleed  POST-OPERATIVE DIAGNOSIS:  EGD: gastritis Colon: polyp hot snare, 2 clips placed; bx ulcer. multiple polyps and ulcers in ascending and rectum  PROCEDURE:  Procedure(s): ESOPHAGOGASTRODUODENOSCOPY (EGD) WITH PROPOFOL (N/A) COLONOSCOPY WITH PROPOFOL (N/A) POLYPECTOMY BIOPSY HEMOSTASIS CLIP PLACEMENT  SURGEON:  Surgeon(s) and Role:    * Tyronza Happe, MD - Primary  Findings ---------- -EGD showed nonobstructive lower esophageal ring, hiatal hernia and gastritis. No active bleeding. -Colonoscopy showed multiple medium to large size polyps and multiple ulcers in the ascending colon as well as in the rectum. Stigmata of recent bleeding from ulcers noted. No active bleeding at the time of procedure.  Recommendations ------------------------- -Advance diet to soft -Hold anticoagulation for additional 5 days -Monitor H&H -Do not recommend repeat colonoscopy because of patient's co morbidities -Discussed with patient's daughter over the phone. -GI will follow  Otis Brace MD, Ward 12/07/2019, 9:29 AM  Contact #  [phone]

## 2019-12-07 NOTE — Op Note (Signed)
[facility] Patient Name: Ana Newman Procedure Date: 12/07/2019 MRN: [id number] Attending MD: Otis Brace , MD Date of Birth: 1935/07/01 CSN: [id number] Age: 84 Admit Type: Inpatient Procedure:                Colonoscopy Indications:              Rectal bleeding Providers:                Otis Brace, MD, Ellyn Isaac, RN, Ladona Ridgel, Technician Referring MD:              Medicines:                Sedation Administered by an Anesthesia Professional Complications:            No immediate complications. Estimated Blood Loss:     Estimated blood loss was minimal. Procedure:                Pre-Anesthesia Assessment:                           - Prior to the procedure, a History and Physical                            was performed, and patient medications and                            allergies were reviewed. The patient's tolerance of                            previous anesthesia was also reviewed. The risks                            and benefits of the procedure and the sedation                            options and risks were discussed with the patient.                            All questions were answered, and informed consent                            was obtained. Prior Anticoagulants: The patient has                            taken Eliquis (apixaban), last dose was 1 day prior                            to procedure. ASA Grade Assessment: III - A patient                            with severe systemic disease. After reviewing the  risks and benefits, the patient was deemed in                            satisfactory condition to undergo the procedure.                           - Prior to the procedure, a History and Physical                            was performed, and patient medications and                            allergies were reviewed. The patient's tolerance of                             previous anesthesia was also reviewed. The risks                            and benefits of the procedure and the sedation                            options and risks were discussed with the patient.                            All questions were answered, and informed consent                            was obtained. Prior Anticoagulants: The patient has                            taken no previous anticoagulant or antiplatelet                            agents. ASA Grade Assessment: III - A patient with                            severe systemic disease. After reviewing the risks                            and benefits, the patient was deemed in                            satisfactory condition to undergo the procedure.                           After obtaining informed consent, the colonoscope                            was passed under direct vision. Throughout the                            procedure, the patient's blood pressure, pulse, and  oxygen saturations were monitored continuously. The                            PCF-H190DL ([id number] Olympus pediatric colonscope                            was introduced through the anus and advanced to the                            the cecum, identified by appendiceal orifice and                            ileocecal valve. The colonoscopy was performed with                            moderate difficulty due to the patient's body                            habitus. Successful completion of the procedure was                            aided by applying abdominal pressure. The patient                            tolerated the procedure well. The quality of the                            bowel preparation was good. Scope In: 8:35:31 AM Scope Out: 9:04:31 AM Scope Withdrawal Time: 0 hours 17 minutes 41 seconds  Total Procedure Duration: 0 hours 29 minutes 0 seconds  Findings:      The perianal exam findings include a  decubitus ulceration.      Multiple 10 to 15 mm size ulcers were found in the ascending colon. No       bleeding was present. Stigmata of recent bleeding were present. Biopsies       were taken with a cold forceps for histology.      A 15 mm polyp was found in the proximal ascending colon. The polyp was       semi-pedunculated. The polyp was removed with a piecemeal technique       using a hot snare. Resection and retrieval were complete. To close a       defect after polypectomy, two hemostatic clips were successfully placed.       There was no bleeding at the end of the procedure.      Three sessile polyps were found in the ascending colon. The polyps were       6 to 10 mm in size. These polyps were removed with a hot snare.       Resection and retrieval were complete.      A few small ulcers were found in the distal rectum. No bleeding was       present. Visualization was limited as patient was not able to retain air. Impression:               - Decubitus ulceration found on perianal exam.                           -  Multiple ulcers in the ascending colon. Biopsied.                           - One 15 mm polyp in the proximal ascending colon,                            removed piecemeal using a hot snare. Resected and                            retrieved. Clips were placed.                           - Three 6 to 10 mm polyps in the ascending colon,                            removed with a hot snare. Resected and retrieved.                           - A few ulcers in the distal rectum. Moderate Sedation:      Moderate (conscious) sedation was personally administered by an       anesthesia professional. The following parameters were monitored: oxygen       saturation, heart rate, blood pressure, and response to care. Recommendation:           - Return patient to hospital ward for ongoing care.                           - Resume previous diet.                           - Continue present  medications.                           - Await pathology results.                           - Resume Eliquis (apixaban) at prior dose in 5 days.                           - No repeat colonoscopy due to current age (29                            years or older). Procedure Code(s):        --- Professional ---                           [phone], Colonoscopy, flexible; with removal of                            tumor(s), polyp(s), or other lesion(s) by snare                            technique  30160, 62, Colonoscopy, flexible; with biopsy,                            single or multiple Diagnosis Code(s):        --- Professional ---                           L89.899, Pressure ulcer of other site, unspecified                            stage                           K63.3, Ulcer of intestine                           K62.6, Ulcer of anus and rectum                           K63.5, Polyp of colon                           K62.5, Hemorrhage of anus and rectum CPT copyright 2019 [employer]. All rights reserved. The codes documented in this report are preliminary and upon coder review may  be revised to meet current compliance requirements. Otis Brace, MD Otis Brace, MD 12/07/2019 9:24:40 AM Number of Addenda: 0

## 2019-12-07 NOTE — Anesthesia Postprocedure Evaluation (Signed)
Anesthesia Post Note  Patient: TALOR DESROSIERS  Procedure(s) Performed: ESOPHAGOGASTRODUODENOSCOPY (EGD) WITH PROPOFOL (N/A ) COLONOSCOPY WITH PROPOFOL (N/A ) POLYPECTOMY BIOPSY HEMOSTASIS CLIP PLACEMENT     Patient location during evaluation: Endoscopy Anesthesia Type: MAC Level of consciousness: awake and alert Pain management: pain level controlled Vital Signs Assessment: post-procedure vital signs reviewed and stable Respiratory status: spontaneous breathing, nonlabored ventilation and respiratory function stable Cardiovascular status: blood pressure returned to baseline and stable Postop Assessment: no apparent nausea or vomiting Anesthetic complications: no   No complications documented.  Last Vitals:  Vitals:   12/07/19 0930 12/07/19 0940  BP:  134/66  Pulse:    Resp: (!) 22 (!) 24  Temp:    SpO2:  100%    Last Pain:  Vitals:   12/07/19 0920  TempSrc:   PainSc: 0-No pain                 Lidia Collum

## 2019-12-07 NOTE — Op Note (Signed)
[facility] Patient Name: Ana Newman Procedure Date: 12/07/2019 MRN: [id number] Attending MD: Otis Brace , MD Date of Birth: Sep 27, 1935 CSN: [id number] Age: 84 Admit Type: Inpatient Procedure:                Upper GI endoscopy Indications:              Active gastrointestinal bleeding Providers:                Otis Brace, MD, Svara Isaac, RN, Ladona Ridgel, Technician Referring MD:              Medicines:                Sedation Administered by an Anesthesia Professional Complications:            No immediate complications. Estimated Blood Loss:     Estimated blood loss was minimal. Procedure:                Pre-Anesthesia Assessment:                           - Prior to the procedure, a History and Physical                            was performed, and patient medications and                            allergies were reviewed. The patient's tolerance of                            previous anesthesia was also reviewed. The risks                            and benefits of the procedure and the sedation                            options and risks were discussed with the patient.                            All questions were answered, and informed consent                            was obtained. Prior Anticoagulants: The patient has                            taken Eliquis (apixaban), last dose was 1 day prior                            to procedure. ASA Grade Assessment: III - A patient                            with severe systemic disease. After reviewing the  risks and benefits, the patient was deemed in                            satisfactory condition to undergo the procedure.                           After obtaining informed consent, the endoscope was                            passed under direct vision. Throughout the                            procedure, the patient's blood pressure,  pulse, and                            oxygen saturations were monitored continuously. The                            [id number]-[id number] ([id number] Olympus gastroscope was                            introduced through the mouth, and advanced to the                            second part of duodenum. The upper GI endoscopy was                            accomplished without difficulty. The patient                            tolerated the procedure well. Scope In: Scope Out: Findings:      A non-obstructing Schatzki ring was found at the gastroesophageal       junction.      A medium-sized hiatal hernia was present.      Diffuse mild inflammation was found in the entire examined stomach.      The cardia and gastric fundus were normal on retroflexion.      The duodenal bulb, first portion of the duodenum and second portion of       the duodenum were normal. Impression:               - Non-obstructing Schatzki ring.                           - Medium-sized hiatal hernia.                           - Gastritis.                           - Normal duodenal bulb, first portion of the                            duodenum and second portion of the duodenum.                           -  No specimens collected. Moderate Sedation:      Moderate (conscious) sedation was personally administered by an       anesthesia professional. The following parameters were monitored: oxygen       saturation, heart rate, blood pressure, and response to care. Recommendation:           - Perform a colonoscopy today. Procedure Code(s):        --- Professional ---                           [phone], Esophagogastroduodenoscopy, flexible,                            transoral; diagnostic, including collection of                            specimen(s) by brushing or washing, when performed                            (separate procedure) Diagnosis Code(s):        --- Professional ---                           K22.2, Esophageal  obstruction                           K44.9, Diaphragmatic hernia without obstruction or                            gangrene                           K29.70, Gastritis, unspecified, without bleeding                           K92.2, Gastrointestinal hemorrhage, unspecified CPT copyright 2019 [employer]. All rights reserved. The codes documented in this report are preliminary and upon coder review may  be revised to meet current compliance requirements. Otis Brace, MD Otis Brace, MD 12/07/2019 9:11:22 AM Number of Addenda: 0

## 2019-12-07 NOTE — Transfer of Care (Signed)
Immediate Anesthesia Transfer of Care Note  Patient: Ana Newman  Procedure(s) Performed: Procedure(s): ESOPHAGOGASTRODUODENOSCOPY (EGD) WITH PROPOFOL (N/A) COLONOSCOPY WITH PROPOFOL (N/A) POLYPECTOMY BIOPSY HEMOSTASIS CLIP PLACEMENT  Patient Location: PACU and Endoscopy Unit  Anesthesia Type:MAC  Level of Consciousness: awake, alert  and oriented  Airway & Oxygen Therapy: Patient Spontanous Breathing and Patient connected to nasal cannula oxygen  Post-op Assessment: Report given to RN and Post -op Vital signs reviewed and stable  Post vital signs: Reviewed and stable  Last Vitals:  Vitals:   12/07/19 0400 12/07/19 0750  BP: (!) 121/50 (!) 142/62  Pulse: 83 91  Resp: (!) 21 (!) 23  Temp: 36.8 C 36.7 C  SpO2: 782% 956%    Complications: No apparent anesthesia complications
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
## 2019-12-07 NOTE — Progress Notes (Signed)
[facility] Gastroenterology Progress Note  Ana Newman 84 y.o. Jun 08, 1935  CC:   Rectal with the prep.   Subjective: Patient seen and examined in the Endo unit.  Drink off of the prep.  Had ongoing rectal bleeding.  Hemoglobin stable.  CT findings discussed with the patient.  ROS : Afebrile.  Negative for chest pain   Objective: Vital signs in last 24 hours: Vitals:   12/07/19 0400 12/07/19 0750  BP: (!) 121/50 (!) 142/62  Pulse: 83 91  Resp: (!) 21 (!) 23  Temp: 98.2 F (36.8 C) 98.1 F (36.7 C)  SpO2: 100% 100%    Physical Exam:  General:   Elderly patient, not in acute distress, resting comfortably in the bed  Head:  Normocephalic, without obvious abnormality, atraumatic  Eyes:  , EOM's intact,   Lungs:    No visible respiratory distress noted  Heart:  Regular rate and rhythm, S1, S2 normal  Abdomen:   Soft, non-tender, nondistended, bowel sounds present, no peritoneal signs  Psych  mood and affect normal  Rectal  no external hemorrhoids seen.  No rectal mass.  Maroon-colored blood started coming out of rectum after rectal exam.    Lab Results: Recent Labs    12/06/19 0410 12/07/19 0343  NA 136 136  K 4.5 4.8  CL 105 107  CO2 20* 20*  GLUCOSE 100* 80  BUN 21 19  CREATININE 1.06* 0.96  CALCIUM 7.8* 7.8*   Recent Labs    12/04/19 1019 12/07/19 0343  AST 18 18  ALT 19 15  ALKPHOS 89 70  BILITOT 0.6 0.5  PROT 6.8 5.5*  ALBUMIN 2.9* 2.4*   Recent Labs    12/04/19 1019 12/05/19 0402 12/06/19 2339 12/07/19 0343  WBC 7.7   < > 8.4 7.1  NEUTROABS 4.6  --   --   --   HGB 10.3*   < > 10.7* 10.2*  HCT 34.3*   < > 32.8* 32.0*  MCV 99.4   < > 95.1 96.1  PLT 310   < > 211 217   < > = values in this interval not displayed.   Recent Labs    12/04/19 1019  LABPROT 16.8*  INR 1.4*      Assessment/Plan: -Painless rectal bleeding.  CT scan showed wall thickening of the rectum with venous oozing of blood. -Acute blood loss anemia -History of  coffee-ground emesis few weeks ago. -History of left upper extremity DVT.  Eliquis was restarted yesterday because of lack of further bleeding and stability of hemoglobin..  Last dose yesterday night.  Currently on hold. -Bedbound with chronic disability  Recommendations -------------------------- -Proceed with EGD and colonoscopy today.  Eliquis on hold.  Risks (bleeding, infection, bowel perforation that could require surgery, sedation-related changes in cardiopulmonary systems), benefits (identification and possible treatment of source of symptoms, exclusion of certain causes of symptoms), and alternatives (watchful waiting, radiographic imaging studies, empiric medical treatment)  were explained to patient/family in detail and patient wishes to proceed.   Otis Brace MD, FACP 12/07/2019, 8:24 AM  Contact #  [phone]

## 2019-12-07 NOTE — Anesthesia Preprocedure Evaluation (Addendum)
Anesthesia Evaluation  Patient identified by MRN, date of birth, ID band Patient awake    Reviewed: Allergy & Precautions, NPO status , Patient's Chart, lab work & pertinent test results  History of Anesthesia Complications Negative for: history of anesthetic complications  Airway Mallampati: II  TM Distance: >3 FB Neck ROM: Full    Dental   Pulmonary neg pulmonary ROS,    Pulmonary exam normal        Cardiovascular hypertension, + DVT  Normal cardiovascular exam     Neuro/Psych Depression Dementia negative neurological ROS  negative psych ROS   GI/Hepatic Neg liver ROS, GERD  ,BRBPR; s/p 2U PRBC   Endo/Other  diabetes, Type 2Hypothyroidism   Renal/GU Renal InsufficiencyRenal disease  negative genitourinary   Musculoskeletal  (+) Arthritis ,   Abdominal   Peds  Hematology  (+) anemia , Eliquis   Anesthesia Other Findings  Recent admission for septic shock (10/21 > 10/31)  Reproductive/Obstetrics                            Anesthesia Physical Anesthesia Plan  ASA: III  Anesthesia Plan: MAC   Post-op Pain Management:    Induction: Intravenous  PONV Risk Score and Plan: 2 and Propofol infusion, TIVA and Treatment may vary due to age or medical condition  Airway Management Planned: Natural Airway, Nasal Cannula and Simple Face Mask  Additional Equipment: None  Intra-op Plan:   Post-operative Plan:   Informed Consent: I have reviewed the patients History and Physical, chart, labs and discussed the procedure including the risks, benefits and alternatives for the proposed anesthesia with the patient or authorized representative who has indicated his/her understanding and acceptance.   Patient has DNR.  Discussed DNR with power of attorney and Suspend DNR.   Consent reviewed with POA  Plan Discussed with:   Anesthesia Plan Comments:       Anesthesia Quick Evaluation

## 2019-12-08 LAB — TYPE AND SCREEN
ABO/RH(D): B NEG
Antibody Screen: NEGATIVE
Unit division: 0
Unit division: 0
Unit division: 0
Unit division: 0

## 2019-12-08 LAB — BPAM RBC
Blood Product Expiration Date: 202111282359
Blood Product Expiration Date: 202111282359
Blood Product Expiration Date: 202112052359
Blood Product Expiration Date: 202112102359
ISSUE DATE / TIME: 202111040555
ISSUE DATE / TIME: 202111041101
Unit Type and Rh: 1700
Unit Type and Rh: 1700
Unit Type and Rh: 1700
Unit Type and Rh: 1700

## 2019-12-08 LAB — CBC
HCT: 30.4 % — ABNORMAL LOW (ref 36.0–46.0)
Hemoglobin: 9.6 g/dL — ABNORMAL LOW (ref 12.0–15.0)
MCH: 30.5 pg (ref 26.0–34.0)
MCHC: 31.6 g/dL (ref 30.0–36.0)
MCV: 96.5 fL (ref 80.0–100.0)
Platelets: 210 10*3/uL (ref 150–400)
RBC: 3.15 MIL/uL — ABNORMAL LOW (ref 3.87–5.11)
RDW: 16.6 % — ABNORMAL HIGH (ref 11.5–15.5)
WBC: 6.4 10*3/uL (ref 4.0–10.5)
nRBC: 0 % (ref 0.0–0.2)

## 2019-12-08 MED ORDER — TRAZODONE HCL 50 MG PO TABS
50.0000 mg | ORAL_TABLET | Freq: Once | ORAL | Status: AC
  Administered 2019-12-08: 50 mg via ORAL
  Filled 2019-12-08: qty 1

## 2019-12-08 NOTE — Progress Notes (Signed)
[facility] Gastroenterology Progress Note  MOYINOLUWA DAWE 84 y.o. [birth year]   Subjective: Feels good. Denies abdominal pain, N/V.  Objective: Vital signs: Vitals:   12/08/19 0519 12/08/19 1309  BP: (!) 104/51 (!) 175/88  Pulse: 85 (!) 109  Resp: 20 18  Temp: 98.7 F (37.1 C) 98.9 F (37.2 C)  SpO2: 100% 100%    Physical Exam: Gen: lethargic, elderly, frail, thin, no acute distress, pleasant HEENT: anicteric sclera CV: RRR Chest: CTA B Abd: soft, nontender, nondistended, +BS Ext: no edema  Lab Results: Recent Labs    12/06/19 0410 12/07/19 0343  NA 136 136  K 4.5 4.8  CL 105 107  CO2 20* 20*  GLUCOSE 100* 80  BUN 21 19  CREATININE 1.06* 0.96  CALCIUM 7.8* 7.8*   Recent Labs    12/07/19 0343  AST 18  ALT 15  ALKPHOS 70  BILITOT 0.5  PROT 5.5*  ALBUMIN 2.4*   Recent Labs    12/07/19 0343 12/08/19 0443  WBC 7.1 6.4  HGB 10.2* 9.6*  HCT 32.0* 30.4*  MCV 96.1 96.5  PLT 217 210      Assessment/Plan: Hematochezia in the setting of Eliquis - resolved off of anticoagulation. Suspect from the colonic ulcers seen on recent colonoscopy. Colon polyps removed as well. Path pending. Continue to hold anticoagulation for now. Dr. Alessandra Bevels will f/u on path as outpt. Will sign off. Call if questions.   Lear Ng 12/08/2019, 3:46 PM  Questions please call [phone] ID: Jessica Priest, female   DOB: [birth year], 84 y.o.   MRN: [id number]
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
## 2019-12-09 LAB — BASIC METABOLIC PANEL
Anion gap: 8 (ref 5–15)
BUN: 10 mg/dL (ref 8–23)
CO2: 20 mmol/L — ABNORMAL LOW (ref 22–32)
Calcium: 7.7 mg/dL — ABNORMAL LOW (ref 8.9–10.3)
Chloride: 111 mmol/L (ref 98–111)
Creatinine, Ser: 0.78 mg/dL (ref 0.44–1.00)
GFR, Estimated: >60 mL/min (ref >60)
Glucose, Bld: 111 mg/dL — ABNORMAL HIGH (ref 70–99)
Potassium: 3.4 mmol/L — ABNORMAL LOW (ref 3.5–5.1)
Sodium: 139 mmol/L (ref 135–145)

## 2019-12-09 LAB — CBC
HCT: 28.6 % — ABNORMAL LOW (ref 36.0–46.0)
Hemoglobin: 9.3 g/dL — ABNORMAL LOW (ref 12.0–15.0)
MCH: 30.6 pg (ref 26.0–34.0)
MCHC: 32.5 g/dL (ref 30.0–36.0)
MCV: 94.1 fL (ref 80.0–100.0)
Platelets: 240 10*3/uL (ref 150–400)
RBC: 3.04 MIL/uL — ABNORMAL LOW (ref 3.87–5.11)
RDW: 16.2 % — ABNORMAL HIGH (ref 11.5–15.5)
WBC: 5.9 10*3/uL (ref 4.0–10.5)
nRBC: 0 % (ref 0.0–0.2)
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
## 2019-12-10 DIAGNOSIS — E034 Atrophy of thyroid (acquired): Secondary | ICD-10-CM | POA: Diagnosis not present

## 2019-12-10 DIAGNOSIS — G9341 Metabolic encephalopathy: Secondary | ICD-10-CM | POA: Diagnosis present

## 2019-12-10 DIAGNOSIS — D62 Acute posthemorrhagic anemia: Secondary | ICD-10-CM | POA: Diagnosis not present

## 2019-12-10 DIAGNOSIS — M4628 Osteomyelitis of vertebra, sacral and sacrococcygeal region: Secondary | ICD-10-CM | POA: Diagnosis present

## 2019-12-10 DIAGNOSIS — N179 Acute kidney failure, unspecified: Secondary | ICD-10-CM | POA: Diagnosis present

## 2019-12-10 DIAGNOSIS — A419 Sepsis, unspecified organism: Secondary | ICD-10-CM | POA: Diagnosis present

## 2019-12-10 DIAGNOSIS — Z515 Encounter for palliative care: Secondary | ICD-10-CM | POA: Diagnosis not present

## 2019-12-10 DIAGNOSIS — K2970 Gastritis, unspecified, without bleeding: Secondary | ICD-10-CM | POA: Diagnosis present

## 2019-12-10 DIAGNOSIS — I5030 Unspecified diastolic (congestive) heart failure: Secondary | ICD-10-CM | POA: Diagnosis present

## 2019-12-10 DIAGNOSIS — J90 Pleural effusion, not elsewhere classified: Secondary | ICD-10-CM | POA: Diagnosis not present

## 2019-12-10 DIAGNOSIS — D638 Anemia in other chronic diseases classified elsewhere: Secondary | ICD-10-CM | POA: Diagnosis present

## 2019-12-10 DIAGNOSIS — E119 Type 2 diabetes mellitus without complications: Secondary | ICD-10-CM | POA: Diagnosis not present

## 2019-12-10 DIAGNOSIS — I82622 Acute embolism and thrombosis of deep veins of left upper extremity: Secondary | ICD-10-CM | POA: Diagnosis present

## 2019-12-10 DIAGNOSIS — K625 Hemorrhage of anus and rectum: Secondary | ICD-10-CM | POA: Diagnosis not present

## 2019-12-10 DIAGNOSIS — Z7401 Bed confinement status: Secondary | ICD-10-CM | POA: Diagnosis not present

## 2019-12-10 DIAGNOSIS — R627 Adult failure to thrive: Secondary | ICD-10-CM | POA: Diagnosis present

## 2019-12-10 DIAGNOSIS — E039 Hypothyroidism, unspecified: Secondary | ICD-10-CM | POA: Diagnosis present

## 2019-12-10 DIAGNOSIS — Z90710 Acquired absence of both cervix and uterus: Secondary | ICD-10-CM | POA: Diagnosis not present

## 2019-12-10 DIAGNOSIS — E559 Vitamin D deficiency, unspecified: Secondary | ICD-10-CM | POA: Diagnosis not present

## 2019-12-10 DIAGNOSIS — E1169 Type 2 diabetes mellitus with other specified complication: Secondary | ICD-10-CM | POA: Diagnosis not present

## 2019-12-10 DIAGNOSIS — F0280 Dementia in other diseases classified elsewhere without behavioral disturbance: Secondary | ICD-10-CM | POA: Diagnosis present

## 2019-12-10 DIAGNOSIS — G309 Alzheimer's disease, unspecified: Secondary | ICD-10-CM | POA: Diagnosis present

## 2019-12-10 DIAGNOSIS — L89150 Pressure ulcer of sacral region, unstageable: Secondary | ICD-10-CM | POA: Diagnosis present

## 2019-12-10 DIAGNOSIS — N202 Calculus of kidney with calculus of ureter: Secondary | ICD-10-CM | POA: Diagnosis present

## 2019-12-10 DIAGNOSIS — R58 Hemorrhage, not elsewhere classified: Secondary | ICD-10-CM | POA: Diagnosis not present

## 2019-12-10 DIAGNOSIS — L89159 Pressure ulcer of sacral region, unspecified stage: Secondary | ICD-10-CM | POA: Diagnosis not present

## 2019-12-10 DIAGNOSIS — I959 Hypotension, unspecified: Secondary | ICD-10-CM | POA: Diagnosis not present

## 2019-12-10 DIAGNOSIS — M2550 Pain in unspecified joint: Secondary | ICD-10-CM | POA: Diagnosis not present

## 2019-12-10 DIAGNOSIS — M1991 Primary osteoarthritis, unspecified site: Secondary | ICD-10-CM | POA: Diagnosis present

## 2019-12-10 DIAGNOSIS — I152 Hypertension secondary to endocrine disorders: Secondary | ICD-10-CM | POA: Diagnosis not present

## 2019-12-10 DIAGNOSIS — E872 Acidosis: Secondary | ICD-10-CM | POA: Diagnosis present

## 2019-12-10 DIAGNOSIS — L304 Erythema intertrigo: Secondary | ICD-10-CM | POA: Diagnosis not present

## 2019-12-10 DIAGNOSIS — L2089 Other atopic dermatitis: Secondary | ICD-10-CM | POA: Diagnosis not present

## 2019-12-10 DIAGNOSIS — E785 Hyperlipidemia, unspecified: Secondary | ICD-10-CM | POA: Diagnosis present

## 2019-12-10 DIAGNOSIS — R1030 Lower abdominal pain, unspecified: Secondary | ICD-10-CM | POA: Diagnosis not present

## 2019-12-10 DIAGNOSIS — N210 Calculus in bladder: Secondary | ICD-10-CM | POA: Diagnosis present

## 2019-12-10 DIAGNOSIS — Z960 Presence of urogenital implants: Secondary | ICD-10-CM | POA: Diagnosis present

## 2019-12-10 DIAGNOSIS — K219 Gastro-esophageal reflux disease without esophagitis: Secondary | ICD-10-CM | POA: Diagnosis present

## 2019-12-10 DIAGNOSIS — R6521 Severe sepsis with septic shock: Secondary | ICD-10-CM | POA: Diagnosis present

## 2019-12-10 DIAGNOSIS — R52 Pain, unspecified: Secondary | ICD-10-CM | POA: Diagnosis not present

## 2019-12-10 DIAGNOSIS — K922 Gastrointestinal hemorrhage, unspecified: Secondary | ICD-10-CM | POA: Diagnosis not present

## 2019-12-10 DIAGNOSIS — L89313 Pressure ulcer of right buttock, stage 3: Secondary | ICD-10-CM | POA: Diagnosis not present

## 2019-12-10 DIAGNOSIS — R32 Unspecified urinary incontinence: Secondary | ICD-10-CM | POA: Diagnosis present

## 2019-12-10 DIAGNOSIS — Z86718 Personal history of other venous thrombosis and embolism: Secondary | ICD-10-CM | POA: Diagnosis not present

## 2019-12-10 DIAGNOSIS — N390 Urinary tract infection, site not specified: Secondary | ICD-10-CM | POA: Diagnosis present

## 2019-12-10 DIAGNOSIS — R0689 Other abnormalities of breathing: Secondary | ICD-10-CM | POA: Diagnosis not present

## 2019-12-10 DIAGNOSIS — Z20822 Contact with and (suspected) exposure to covid-19: Secondary | ICD-10-CM | POA: Diagnosis present

## 2019-12-10 DIAGNOSIS — A411 Sepsis due to other specified staphylococcus: Secondary | ICD-10-CM | POA: Diagnosis present

## 2019-12-10 DIAGNOSIS — K5904 Chronic idiopathic constipation: Secondary | ICD-10-CM | POA: Diagnosis present

## 2019-12-10 DIAGNOSIS — E782 Mixed hyperlipidemia: Secondary | ICD-10-CM | POA: Diagnosis not present

## 2019-12-10 DIAGNOSIS — G301 Alzheimer's disease with late onset: Secondary | ICD-10-CM | POA: Diagnosis not present

## 2019-12-10 DIAGNOSIS — N201 Calculus of ureter: Secondary | ICD-10-CM | POA: Diagnosis not present

## 2019-12-10 DIAGNOSIS — F333 Major depressive disorder, recurrent, severe with psychotic symptoms: Secondary | ICD-10-CM | POA: Diagnosis present

## 2019-12-10 DIAGNOSIS — L98411 Non-pressure chronic ulcer of buttock limited to breakdown of skin: Secondary | ICD-10-CM | POA: Diagnosis not present

## 2019-12-10 DIAGNOSIS — R1084 Generalized abdominal pain: Secondary | ICD-10-CM | POA: Diagnosis not present

## 2019-12-10 DIAGNOSIS — R000 Tachycardia, unspecified: Secondary | ICD-10-CM | POA: Diagnosis not present

## 2019-12-10 DIAGNOSIS — K567 Ileus, unspecified: Secondary | ICD-10-CM | POA: Diagnosis not present

## 2019-12-10 DIAGNOSIS — Z23 Encounter for immunization: Secondary | ICD-10-CM | POA: Diagnosis not present

## 2019-12-10 DIAGNOSIS — N319 Neuromuscular dysfunction of bladder, unspecified: Secondary | ICD-10-CM | POA: Diagnosis present

## 2019-12-10 DIAGNOSIS — R6520 Severe sepsis without septic shock: Secondary | ICD-10-CM | POA: Diagnosis not present

## 2019-12-10 DIAGNOSIS — D649 Anemia, unspecified: Secondary | ICD-10-CM | POA: Diagnosis present

## 2019-12-10 DIAGNOSIS — R509 Fever, unspecified: Secondary | ICD-10-CM | POA: Diagnosis not present

## 2019-12-10 DIAGNOSIS — I10 Essential (primary) hypertension: Secondary | ICD-10-CM | POA: Diagnosis present

## 2019-12-10 DIAGNOSIS — N200 Calculus of kidney: Secondary | ICD-10-CM | POA: Diagnosis present

## 2019-12-10 DIAGNOSIS — E46 Unspecified protein-calorie malnutrition: Secondary | ICD-10-CM | POA: Diagnosis present

## 2019-12-10 DIAGNOSIS — Z7189 Other specified counseling: Secondary | ICD-10-CM | POA: Diagnosis not present

## 2019-12-10 DIAGNOSIS — E1159 Type 2 diabetes mellitus with other circulatory complications: Secondary | ICD-10-CM | POA: Diagnosis present

## 2019-12-10 DIAGNOSIS — K449 Diaphragmatic hernia without obstruction or gangrene: Secondary | ICD-10-CM | POA: Diagnosis not present

## 2019-12-10 DIAGNOSIS — Z66 Do not resuscitate: Secondary | ICD-10-CM | POA: Diagnosis present

## 2019-12-10 LAB — CBC
HCT: 28.4 % — ABNORMAL LOW (ref 36.0–46.0)
Hemoglobin: 9 g/dL — ABNORMAL LOW (ref 12.0–15.0)
MCH: 30.1 pg (ref 26.0–34.0)
MCHC: 31.7 g/dL (ref 30.0–36.0)
MCV: 95 fL (ref 80.0–100.0)
Platelets: 264 10*3/uL (ref 150–400)
RBC: 2.99 MIL/uL — ABNORMAL LOW (ref 3.87–5.11)
RDW: 16.4 % — ABNORMAL HIGH (ref 11.5–15.5)
WBC: 5.6 10*3/uL (ref 4.0–10.5)
nRBC: 0 % (ref 0.0–0.2)

## 2019-12-10 LAB — BASIC METABOLIC PANEL
Anion gap: 7 (ref 5–15)
BUN: 10 mg/dL (ref 8–23)
CO2: 21 mmol/L — ABNORMAL LOW (ref 22–32)
Calcium: 7.7 mg/dL — ABNORMAL LOW (ref 8.9–10.3)
Chloride: 110 mmol/L (ref 98–111)
Creatinine, Ser: 0.81 mg/dL (ref 0.44–1.00)
GFR, Estimated: >60 mL/min (ref >60)
Glucose, Bld: 89 mg/dL (ref 70–99)
Potassium: 4.1 mmol/L (ref 3.5–5.1)
Sodium: 138 mmol/L (ref 135–145)

## 2019-12-10 LAB — SURGICAL PATHOLOGY

## 2019-12-10 MED ORDER — ACETAMINOPHEN 325 MG PO TABS: 650.0000 mg | ORAL_TABLET | Freq: Four times a day (QID) | ORAL | Status: DC | PRN

## 2019-12-10 NOTE — TOC Transition Note (Signed)
Transition of Care [facility]) - CM/SW Discharge Note   Patient Details  Name: Ana Newman MRN: [id number] Date of Birth: 1935/03/23  [facility] CM/SW Contact:  Trish Mage, LCSW Phone Number: 12/10/2019, 10:43 AM   Clinical Narrative:   Patient scheduled for d/c today will be returning to [facility].  PTAR arranged.  Nursing, please call report to [phone], room 217b.  TOC sign off.    Final next level of care: [place]     Patient Goals and CMS Choice        Discharge Placement                       Discharge Plan and Services                                     Social Determinants of Health (SDOH) Interventions     Readmission Risk Interventions No flowsheet data found.

## 2019-12-10 NOTE — Care Management Important Message (Signed)
Important Message  Patient Details IM Letter given to the Patient Name: Ana Newman MRN: [id number] Date of Birth: 1935/09/23   Medicare Important Message Given:  Yes     Kerin Salen 12/10/2019, 11:20 AM

## 2019-12-10 NOTE — Discharge Instructions (Signed)
Gastrointestinal Bleeding Gastrointestinal (GI) bleeding is bleeding somewhere along the path that food travels through the body (digestive tract). This path is anywhere between the mouth and the opening of the butt (anus). You may have blood in your poop (stool) or have black poop. If you throw up (vomit), there may be blood in it. This condition can be mild, serious, or even life-threatening. If you have a lot of bleeding, you may need to stay in the hospital. What are the causes? This condition may be caused by:  Irritation and swelling of the esophagus (esophagitis). The esophagus is part of the body that moves food from your mouth to your stomach.  Swollen veins in the butt (hemorrhoids).  Areas of painful tearing in the opening of the butt (anal fissures). These are often caused by passing hard poop.  Pouches that form on the colon over time (diverticulosis).  Irritation and swelling (diverticulitis) in areas where pouches have formed on the colon.  Growths (polyps) or cancer. Colon cancer often starts out as growths that are not cancer.  Irritation of the stomach lining (gastritis).  Sores (ulcers) in the stomach. What increases the risk? You are more likely to develop this condition if you:  Have a certain type of infection in your stomach (Helicobacter pylori infection).  Take certain medicines.  Smoke.  Drink alcohol. What are the signs or symptoms? Common symptoms of this condition include:  Throwing up (vomiting) material that has bright red blood in it. It may look like coffee grounds.  Changes in your poop. The poop may: ? Have red blood in it. ? Be black, look like tar, and smell stronger than normal. ? Be red.  Pain or cramping in the belly (abdomen). How is this treated? Treatment for this condition depends on the cause of the bleeding. For example:  Sometimes, the bleeding can be stopped during a procedure that is done to find the problem (endoscopy or  colonoscopy).  Medicines can be used to: ? Help control irritation, swelling, or infection. ? Reduce acid in your stomach.  Certain problems can be treated with: ? Creams. ? Medicines that are put in the butt (suppositories). ? Warm baths.  Surgery is sometimes needed.  If you lose a lot of blood, you may need a blood transfusion. If bleeding is mild, you may be allowed to go home. If there is a lot of bleeding, you will need to stay in the hospital. Follow these instructions at home:   Take over-the-counter and prescription medicines only as told by your doctor.  Eat foods that have a lot of fiber in them. These foods include beans, whole grains, and fresh fruits and vegetables. You can also try eating 1-3 prunes each day.  Drink enough fluid to keep your pee (urine) pale yellow.  Keep all follow-up visits as told by your doctor. This is important. Contact a doctor if:  Your symptoms do not get better. Get help right away if:  Your bleeding does not stop.  You feel dizzy or you pass out (faint).  You feel weak.  You have very bad cramps in your back or belly.  You pass large clumps of blood (clots) in your poop.  Your symptoms are getting worse.  You have chest pain or fast heartbeats. Summary  GI bleeding is bleeding somewhere along the path that food travels through the body (digestive tract).  This bleeding can be caused by many things. Treatment depends on the cause of the bleeding.    Take medicines only as told by your doctor.  Keep all follow-up visits as told by your doctor. This is important. This information is not intended to replace advice given to you by your health care provider. Make sure you discuss any questions you have with your health care provider. Document Revised: 08/31/2017 Document Reviewed: 08/31/2017 Elsevier Patient Education  [place].   Preventing Pressure Injuries  A pressure injury, sometimes called a bedsore or a  pressure ulcer, is an injury to the skin and underlying tissue caused by pressure. A pressure injury can happen when your skin presses against a surface, such as a mattress or wheelchair seat, for too long. The pressure on the blood vessels causes reduced blood flow to your skin. This can eventually cause the skin tissue to die and break down into a wound. Pressure injuries usually develop:  Over bony parts of the body, such as the tailbone, shoulders, elbows, hips, and heels.  Under medical devices, such as respiratory equipment, stockings, tubes, and splints. How can this condition affect me? Pressure injuries are caused by a lack of blood supply to an area of skin. These injuries begin as a reddened area on the skin and can become an open sore. They can result from intense pressure over a short period of time or from less pressure over a long period of time. Pressure injuries can vary in severity. They can cause pain, muscle damage, and infection. What can increase my risk? This condition is more likely to develop in people who:  Are in the hospital or an extended care facility.  Are bedridden or in a wheelchair.  Have an injury or disease that keeps them from: ? Moving normally. ? Feeling pain or pressure. ? Communicating if they feel pain or pressure.  Have a condition that: ? Makes them sleepy or less alert. ? Causes poor blood flow.  Need to wear a medical device.  Have poor control of their bladder or bowel functions (incontinence).  Have poor nutrition (malnutrition).  Have had this condition before.  Are of certain ethnicities. People of African American, Latino, or Hispanic descent are at higher risk compared to other ethnic groups. What actions can I take to prevent pressure injuries? Reducing and redistributing pressure  Do not lie or sit in one position for a long time. Move or change position: ? Every hour when out of bed in a chair. ? Every two hours when in  bed. ? As often as told by your health care provider.  Use pillows, wedges, or cushions to redistribute pressure. Ask your health care provider to recommend a mattress, cushions, or pads for you.  Use medical devices that do not rub your skin. Tell your health care provider if one of your medical devices is causing pain or irritation. Skin care If you are in the hospital, your health care providers:  Will inspect your skin, including areas under or around medical devices, at least twice a day.  May recommend that you use certain types of bedding to help prevent pressure injuries. These may include a pad, mattress, or chair cushion that is filled with gel, air, water, or foam.  Will evaluate your nutrition and consult a dietitian if needed.  Will inspect and change any wound dressings regularly.  May help you move into different positions every few hours.  Will adjust any medical devices and braces as needed to limit pressure on your skin.  Will keep your skin clean and dry.  May  use gentle cleansers and skin protectants if you are incontinent.  Will moisturize any dry skin. In general, at home:  Keep your skin clean and dry. Gently pat your skin dry.  Do not rub or massage bony areas of your skin.  Moisturize dry skin.  Use gentle cleansers and skin protectants routinely if you are incontinent.  Check your skin at least once a day for any changes in color and for any new blisters or sores. Make sure to check under and around any medical devices and between skin folds. Have a caregiver do this for you if you are not able.  Lifestyle  Be as active as you can every day. Ask your health care provider to suggest safe exercises or activities.  Do not abuse drugs or alcohol.  Do not use any products that contain nicotine or tobacco, such as cigarettes, e-cigarettes, and chewing tobacco. If you need help quitting, ask your health care provider. General instructions   Take  over-the-counter and prescription medicines only as told by your health care provider.  Work with your health care provider to manage any chronic health conditions.  Eat a healthy diet that includes protein, vitamins, and minerals. Ask your health care provider what types of food you should eat.  Drink enough fluid to keep your urine pale yellow.  Keep all follow-up visits as told by your health care provider. This is important. Contact a health care provider if you:  Feel or see any changes in your skin. Summary  A pressure injury, sometimes called a bedsore or a pressure ulcer, is an injury to the skin and underlying tissue caused by pressure.  Do not lie or sit in one position for a long time.  Check your skin at least once a day for any changes in color and for any new blisters or sores.  Make sure to check under and around any medical devices and between skin folds. Have a caregiver do this for you if you are not able.  Eat a healthy diet that includes protein, vitamins, and minerals. Ask your health care provider what types of food you should eat. This information is not intended to replace advice given to you by your health care provider. Make sure you discuss any questions you have with your health care provider. Document Revised: 05/12/2018 Document Reviewed: 10/11/2017 Elsevier Patient Education  [place].

## 2019-12-10 NOTE — NC FL2 (Signed)
Ana LEVEL OF CARE SCREENING TOOL     IDENTIFICATION  Patient Name: Ana Newman Birthdate: 10/14/1935 Sex: female Admission Date (Current Location): 12/04/2019  [place] and [place] Number:  Ana Newman [id number] Facility and Address:  [facility],  [place] [place], [place]      Provider Number: [id number]  Attending Physician Name and Address:  Cherene Altes, MD  Relative Name and Phone Number:  Smith,Christie Daughter [phone]  [phone]    Current Level of Care: Hospital Recommended Level of Care: [place] Prior Approval Number:    Date Approved/Denied:   PASRR Number: [id number]  Discharge Plan: SNF    Current Diagnoses: Patient Active Problem List   Diagnosis Date Noted  . Anemia due to blood loss 11/25/2019  . Pressure injury of skin 11/23/2019  . Pyelonephritis 03/03/2018  . Leukocytosis 03/03/2018  . Ureterolithiasis 03/03/2018  . Late onset Alzheimer's disease without behavioral disturbance ([place]) 09/18/2017  . Major depression with psychotic features ([place]) 03/14/2017  . Weight loss, non-intentional 10/25/2016  . Chronic constipation 10/08/2016  . Hypertension associated with diabetes ([place]) 05/14/2016  . Bladder outlet obstruction 06/17/2015  . Nephrolithiasis 05/18/2015  . Sepsis due to urinary tract infection ([place]) 05/17/2015  . AKI (acute kidney injury) ([place]) 05/17/2015  . Environmental and seasonal allergies 04/06/2015  . Dyslipidemia associated with type 2 diabetes mellitus ([place]) 02/04/2015  . Diabetes mellitus, type II, insulin dependent ([place]) 02/04/2015  . Depression 01/18/2014  . Hypothyroidism 01/18/2014  . Psychosis ([place])   . Osteoarthritis     Orientation RESPIRATION BLADDER Height & Weight     Self, Situation, Time, Place  Normal External catheter Weight: 52.2 kg Height:  5\' 1"  (154.9 cm)  BEHAVIORAL SYMPTOMS/MOOD NEUROLOGICAL BOWEL NUTRITION STATUS   (none)    Incontinent Diet (see d/c summary)  AMBULATORY STATUS COMMUNICATION OF NEEDS Skin   Total Care Verbally Other (Comment) (Pressure injury-R buttocks upper and lower; L buttocks upper)                       Personal Care Assistance Level of Assistance    Bathing Assistance: Maximum assistance Feeding assistance: Maximum assistance Dressing Assistance: Maximum assistance Total Care Assistance: Maximum assistance   Functional Limitations Info    Sight Info: Adequate Hearing Info: Adequate Speech Info: Adequate    SPECIAL CARE FACTORS FREQUENCY                       Contractures Contractures Info: Present    Additional Factors Info  Code Status Code Status Info: DNR Allergies Info: 5-Alpha Reductase Inhibitors           Current Medications (12/10/2019):  This is the current hospital active medication list Current Facility-Administered Medications  Medication Dose Route Frequency Provider Last Rate Last Admin  . acetaminophen (TYLENOL) tablet 650 mg  650 mg Oral Q6H PRN Harold Hedge, MD      . collagenase (SANTYL) ointment   Topical Daily Harold Hedge, MD   Given at 12/09/19 [phone]  . HYDROcodone-acetaminophen (NORCO/VICODIN) 5-325 MG per tablet 1-2 tablet  1-2 tablet Oral Q4H PRN Harold Hedge, MD   2 tablet at 12/05/19 1533  . morphine 2 MG/ML injection 2 mg  2 mg Intravenous Q2H PRN Harold Hedge, MD   2 mg at 12/06/19 0431  . ondansetron (ZOFRAN) injection 4 mg  4 mg Intravenous Q8H PRN Lang Snow, FNP      .  pantoprazole (PROTONIX) EC tablet 40 mg  40 mg Oral BID Salley Slaughter, PA-C   40 mg at 12/09/19 2141  . polyethylene glycol (MIRALAX / GLYCOLAX) packet 17 g  17 g Oral Daily PRN Harold Hedge, MD      . prochlorperazine (COMPAZINE) injection 10 mg  10 mg Intravenous Q6H PRN Lang Snow, FNP   10 mg at 12/06/19 5790     Discharge Medications: Please see discharge summary for a list of discharge medications.  Relevant Imaging  Results:  Relevant Lab Results:   Additional Information SSN [id number]  Trish Mage, [place]

## 2019-12-10 NOTE — Progress Notes (Signed)
Attempted to call report to [place] at this time.  No one available to take report.  Will attempt again shortly

## 2019-12-10 NOTE — Plan of Care (Signed)

## 2019-12-10 NOTE — Progress Notes (Signed)
Second attempt made to call report to [facility].  Again no one was available to take report.  Will attempt again shortly
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
## 2019-12-11 ENCOUNTER — Encounter (HOSPITAL_COMMUNITY): Payer: Self-pay | Admitting: Gastroenterology

## 2019-12-11 DIAGNOSIS — F333 Major depressive disorder, recurrent, severe with psychotic symptoms: Secondary | ICD-10-CM | POA: Diagnosis not present

## 2019-12-11 DIAGNOSIS — E559 Vitamin D deficiency, unspecified: Secondary | ICD-10-CM | POA: Diagnosis not present

## 2019-12-11 DIAGNOSIS — F0280 Dementia in other diseases classified elsewhere without behavioral disturbance: Secondary | ICD-10-CM | POA: Diagnosis not present

## 2019-12-11 DIAGNOSIS — E119 Type 2 diabetes mellitus without complications: Secondary | ICD-10-CM | POA: Diagnosis not present

## 2019-12-11 DIAGNOSIS — M1991 Primary osteoarthritis, unspecified site: Secondary | ICD-10-CM | POA: Diagnosis not present

## 2019-12-11 DIAGNOSIS — G309 Alzheimer's disease, unspecified: Secondary | ICD-10-CM | POA: Diagnosis not present

## 2019-12-11 DIAGNOSIS — E782 Mixed hyperlipidemia: Secondary | ICD-10-CM | POA: Diagnosis not present

## 2019-12-11 DIAGNOSIS — E785 Hyperlipidemia, unspecified: Secondary | ICD-10-CM | POA: Diagnosis not present

## 2019-12-11 DIAGNOSIS — I10 Essential (primary) hypertension: Secondary | ICD-10-CM | POA: Diagnosis not present

## 2019-12-11 DIAGNOSIS — E039 Hypothyroidism, unspecified: Secondary | ICD-10-CM | POA: Diagnosis not present

## 2019-12-11 DIAGNOSIS — E1159 Type 2 diabetes mellitus with other circulatory complications: Secondary | ICD-10-CM | POA: Diagnosis not present

## 2019-12-12 DIAGNOSIS — L2089 Other atopic dermatitis: Secondary | ICD-10-CM | POA: Diagnosis not present

## 2019-12-12 DIAGNOSIS — K219 Gastro-esophageal reflux disease without esophagitis: Secondary | ICD-10-CM | POA: Diagnosis not present

## 2019-12-12 DIAGNOSIS — G309 Alzheimer's disease, unspecified: Secondary | ICD-10-CM | POA: Diagnosis not present

## 2019-12-12 DIAGNOSIS — L304 Erythema intertrigo: Secondary | ICD-10-CM | POA: Diagnosis not present

## 2019-12-12 DIAGNOSIS — L89313 Pressure ulcer of right buttock, stage 3: Secondary | ICD-10-CM | POA: Diagnosis not present

## 2019-12-12 DIAGNOSIS — L98411 Non-pressure chronic ulcer of buttock limited to breakdown of skin: Secondary | ICD-10-CM | POA: Diagnosis not present

## 2019-12-12 DIAGNOSIS — I10 Essential (primary) hypertension: Secondary | ICD-10-CM | POA: Diagnosis not present

## 2019-12-12 DIAGNOSIS — E1159 Type 2 diabetes mellitus with other circulatory complications: Secondary | ICD-10-CM | POA: Diagnosis not present

## 2019-12-13 DIAGNOSIS — Z23 Encounter for immunization: Secondary | ICD-10-CM | POA: Diagnosis not present

## 2019-12-18 DIAGNOSIS — F333 Major depressive disorder, recurrent, severe with psychotic symptoms: Secondary | ICD-10-CM | POA: Diagnosis not present

## 2019-12-19 DIAGNOSIS — L2089 Other atopic dermatitis: Secondary | ICD-10-CM | POA: Diagnosis not present

## 2019-12-19 DIAGNOSIS — L98411 Non-pressure chronic ulcer of buttock limited to breakdown of skin: Secondary | ICD-10-CM | POA: Diagnosis not present

## 2019-12-19 DIAGNOSIS — L89313 Pressure ulcer of right buttock, stage 3: Secondary | ICD-10-CM | POA: Diagnosis not present

## 2019-12-19 DIAGNOSIS — L304 Erythema intertrigo: Secondary | ICD-10-CM | POA: Diagnosis not present

## 2019-12-26 DIAGNOSIS — L2089 Other atopic dermatitis: Secondary | ICD-10-CM | POA: Diagnosis not present

## 2019-12-26 DIAGNOSIS — L98411 Non-pressure chronic ulcer of buttock limited to breakdown of skin: Secondary | ICD-10-CM | POA: Diagnosis not present

## 2019-12-26 DIAGNOSIS — L304 Erythema intertrigo: Secondary | ICD-10-CM | POA: Diagnosis not present

## 2019-12-26 DIAGNOSIS — L89313 Pressure ulcer of right buttock, stage 3: Secondary | ICD-10-CM | POA: Diagnosis not present

## 2019-12-27 DIAGNOSIS — R52 Pain, unspecified: Secondary | ICD-10-CM | POA: Diagnosis not present

## 2019-12-31 DIAGNOSIS — E782 Mixed hyperlipidemia: Secondary | ICD-10-CM | POA: Diagnosis not present

## 2019-12-31 DIAGNOSIS — I10 Essential (primary) hypertension: Secondary | ICD-10-CM | POA: Diagnosis not present

## 2019-12-31 DIAGNOSIS — K922 Gastrointestinal hemorrhage, unspecified: Secondary | ICD-10-CM | POA: Diagnosis not present

## 2019-12-31 DIAGNOSIS — E1159 Type 2 diabetes mellitus with other circulatory complications: Secondary | ICD-10-CM | POA: Diagnosis not present

## 2020-01-02 DIAGNOSIS — L2089 Other atopic dermatitis: Secondary | ICD-10-CM | POA: Diagnosis not present

## 2020-01-02 DIAGNOSIS — L98411 Non-pressure chronic ulcer of buttock limited to breakdown of skin: Secondary | ICD-10-CM | POA: Diagnosis not present

## 2020-01-02 DIAGNOSIS — L89313 Pressure ulcer of right buttock, stage 3: Secondary | ICD-10-CM | POA: Diagnosis not present

## 2020-01-02 DIAGNOSIS — L304 Erythema intertrigo: Secondary | ICD-10-CM | POA: Diagnosis not present

## 2020-01-04 DIAGNOSIS — F333 Major depressive disorder, recurrent, severe with psychotic symptoms: Secondary | ICD-10-CM | POA: Diagnosis not present

## 2020-01-06 ENCOUNTER — Other Ambulatory Visit: Payer: Self-pay

## 2020-01-06 ENCOUNTER — Inpatient Hospital Stay (HOSPITAL_COMMUNITY)
Admission: EM | Admit: 2020-01-06 | Discharge: 2020-01-11 | DRG: 871 | Disposition: A | Discharge status: Hospice | Payer: Medicare Other | Source: Skilled Nursing Facility | Attending: Family Medicine | Admitting: Family Medicine

## 2020-01-06 ENCOUNTER — Emergency Department (HOSPITAL_COMMUNITY): Payer: Medicare Other

## 2020-01-06 DIAGNOSIS — D638 Anemia in other chronic diseases classified elsewhere: Secondary | ICD-10-CM | POA: Diagnosis present

## 2020-01-06 DIAGNOSIS — N210 Calculus in bladder: Secondary | ICD-10-CM | POA: Diagnosis present

## 2020-01-06 DIAGNOSIS — E039 Hypothyroidism, unspecified: Secondary | ICD-10-CM | POA: Diagnosis present

## 2020-01-06 DIAGNOSIS — K5641 Fecal impaction: Secondary | ICD-10-CM | POA: Diagnosis present

## 2020-01-06 DIAGNOSIS — N179 Acute kidney failure, unspecified: Secondary | ICD-10-CM | POA: Diagnosis present

## 2020-01-06 DIAGNOSIS — Z20822 Contact with and (suspected) exposure to covid-19: Secondary | ICD-10-CM | POA: Diagnosis present

## 2020-01-06 DIAGNOSIS — E785 Hyperlipidemia, unspecified: Secondary | ICD-10-CM | POA: Diagnosis present

## 2020-01-06 DIAGNOSIS — R627 Adult failure to thrive: Secondary | ICD-10-CM | POA: Diagnosis present

## 2020-01-06 DIAGNOSIS — M4628 Osteomyelitis of vertebra, sacral and sacrococcygeal region: Secondary | ICD-10-CM | POA: Diagnosis present

## 2020-01-06 DIAGNOSIS — R000 Tachycardia, unspecified: Secondary | ICD-10-CM | POA: Diagnosis not present

## 2020-01-06 DIAGNOSIS — M21371 Foot drop, right foot: Secondary | ICD-10-CM | POA: Diagnosis present

## 2020-01-06 DIAGNOSIS — E46 Unspecified protein-calorie malnutrition: Secondary | ICD-10-CM | POA: Diagnosis present

## 2020-01-06 DIAGNOSIS — N202 Calculus of kidney with calculus of ureter: Secondary | ICD-10-CM | POA: Diagnosis present

## 2020-01-06 DIAGNOSIS — F0280 Dementia in other diseases classified elsewhere without behavioral disturbance: Secondary | ICD-10-CM | POA: Diagnosis present

## 2020-01-06 DIAGNOSIS — E1159 Type 2 diabetes mellitus with other circulatory complications: Secondary | ICD-10-CM | POA: Diagnosis present

## 2020-01-06 DIAGNOSIS — Z66 Do not resuscitate: Secondary | ICD-10-CM | POA: Diagnosis present

## 2020-01-06 DIAGNOSIS — G301 Alzheimer's disease with late onset: Secondary | ICD-10-CM | POA: Diagnosis not present

## 2020-01-06 DIAGNOSIS — Z90710 Acquired absence of both cervix and uterus: Secondary | ICD-10-CM

## 2020-01-06 DIAGNOSIS — L89159 Pressure ulcer of sacral region, unspecified stage: Secondary | ICD-10-CM | POA: Diagnosis not present

## 2020-01-06 DIAGNOSIS — D509 Iron deficiency anemia, unspecified: Secondary | ICD-10-CM | POA: Diagnosis present

## 2020-01-06 DIAGNOSIS — R6520 Severe sepsis without septic shock: Secondary | ICD-10-CM

## 2020-01-06 DIAGNOSIS — R6521 Severe sepsis with septic shock: Secondary | ICD-10-CM | POA: Diagnosis present

## 2020-01-06 DIAGNOSIS — N390 Urinary tract infection, site not specified: Secondary | ICD-10-CM | POA: Diagnosis present

## 2020-01-06 DIAGNOSIS — E1169 Type 2 diabetes mellitus with other specified complication: Secondary | ICD-10-CM | POA: Diagnosis not present

## 2020-01-06 DIAGNOSIS — Z7401 Bed confinement status: Secondary | ICD-10-CM | POA: Diagnosis not present

## 2020-01-06 DIAGNOSIS — Z7189 Other specified counseling: Secondary | ICD-10-CM | POA: Diagnosis not present

## 2020-01-06 DIAGNOSIS — Z515 Encounter for palliative care: Secondary | ICD-10-CM

## 2020-01-06 DIAGNOSIS — M2550 Pain in unspecified joint: Secondary | ICD-10-CM | POA: Diagnosis not present

## 2020-01-06 DIAGNOSIS — E034 Atrophy of thyroid (acquired): Secondary | ICD-10-CM | POA: Diagnosis not present

## 2020-01-06 DIAGNOSIS — R0689 Other abnormalities of breathing: Secondary | ICD-10-CM | POA: Diagnosis not present

## 2020-01-06 DIAGNOSIS — A411 Sepsis due to other specified staphylococcus: Principal | ICD-10-CM | POA: Diagnosis present

## 2020-01-06 DIAGNOSIS — E872 Acidosis: Secondary | ICD-10-CM | POA: Diagnosis present

## 2020-01-06 DIAGNOSIS — I152 Hypertension secondary to endocrine disorders: Secondary | ICD-10-CM | POA: Diagnosis not present

## 2020-01-06 DIAGNOSIS — R509 Fever, unspecified: Secondary | ICD-10-CM | POA: Diagnosis not present

## 2020-01-06 DIAGNOSIS — K2970 Gastritis, unspecified, without bleeding: Secondary | ICD-10-CM | POA: Diagnosis present

## 2020-01-06 DIAGNOSIS — A419 Sepsis, unspecified organism: Secondary | ICD-10-CM | POA: Diagnosis present

## 2020-01-06 DIAGNOSIS — R1084 Generalized abdominal pain: Secondary | ICD-10-CM | POA: Diagnosis not present

## 2020-01-06 DIAGNOSIS — E561 Deficiency of vitamin K: Secondary | ICD-10-CM | POA: Diagnosis present

## 2020-01-06 DIAGNOSIS — K219 Gastro-esophageal reflux disease without esophagitis: Secondary | ICD-10-CM | POA: Diagnosis present

## 2020-01-06 DIAGNOSIS — G9341 Metabolic encephalopathy: Secondary | ICD-10-CM | POA: Diagnosis present

## 2020-01-06 DIAGNOSIS — L8990 Pressure ulcer of unspecified site, unspecified stage: Secondary | ICD-10-CM | POA: Diagnosis present

## 2020-01-06 DIAGNOSIS — R1030 Lower abdominal pain, unspecified: Secondary | ICD-10-CM | POA: Diagnosis not present

## 2020-01-06 DIAGNOSIS — N201 Calculus of ureter: Secondary | ICD-10-CM | POA: Diagnosis not present

## 2020-01-06 DIAGNOSIS — L89150 Pressure ulcer of sacral region, unstageable: Secondary | ICD-10-CM | POA: Diagnosis present

## 2020-01-06 DIAGNOSIS — D75838 Other thrombocytosis: Secondary | ICD-10-CM | POA: Diagnosis present

## 2020-01-06 DIAGNOSIS — R32 Unspecified urinary incontinence: Secondary | ICD-10-CM | POA: Diagnosis present

## 2020-01-06 DIAGNOSIS — R0902 Hypoxemia: Secondary | ICD-10-CM | POA: Diagnosis present

## 2020-01-06 DIAGNOSIS — Z86718 Personal history of other venous thrombosis and embolism: Secondary | ICD-10-CM | POA: Diagnosis not present

## 2020-01-06 DIAGNOSIS — M21372 Foot drop, left foot: Secondary | ICD-10-CM | POA: Diagnosis present

## 2020-01-06 DIAGNOSIS — N200 Calculus of kidney: Secondary | ICD-10-CM | POA: Diagnosis not present

## 2020-01-06 DIAGNOSIS — R5381 Other malaise: Secondary | ICD-10-CM | POA: Diagnosis present

## 2020-01-06 DIAGNOSIS — K449 Diaphragmatic hernia without obstruction or gangrene: Secondary | ICD-10-CM | POA: Diagnosis not present

## 2020-01-06 DIAGNOSIS — I959 Hypotension, unspecified: Secondary | ICD-10-CM | POA: Diagnosis not present

## 2020-01-06 DIAGNOSIS — J90 Pleural effusion, not elsewhere classified: Secondary | ICD-10-CM | POA: Diagnosis not present

## 2020-01-06 LAB — CBC WITH DIFFERENTIAL/PLATELET
Abs Immature Granulocytes: 0.21 10*3/uL — ABNORMAL HIGH (ref 0.00–0.07)
Basophils Absolute: 0 10*3/uL (ref 0.0–0.1)
Basophils Relative: 0 %
Eosinophils Absolute: 0 10*3/uL (ref 0.0–0.5)
Eosinophils Relative: 0 %
HCT: 31.7 % — ABNORMAL LOW (ref 36.0–46.0)
Hemoglobin: 9.4 g/dL — ABNORMAL LOW (ref 12.0–15.0)
Immature Granulocytes: 1 %
Lymphocytes Relative: 13 %
Lymphs Abs: 1.9 10*3/uL (ref 0.7–4.0)
MCH: 29.6 pg (ref 26.0–34.0)
MCHC: 29.7 g/dL — ABNORMAL LOW (ref 30.0–36.0)
MCV: 99.7 fL (ref 80.0–100.0)
Monocytes Absolute: 0.8 10*3/uL (ref 0.1–1.0)
Monocytes Relative: 6 %
Neutro Abs: 11.6 10*3/uL — ABNORMAL HIGH (ref 1.7–7.7)
Neutrophils Relative %: 80 %
Platelets: 518 10*3/uL — ABNORMAL HIGH (ref 150–400)
RBC: 3.18 MIL/uL — ABNORMAL LOW (ref 3.87–5.11)
RDW: 14.7 % (ref 11.5–15.5)
WBC: 14.5 10*3/uL — ABNORMAL HIGH (ref 4.0–10.5)
nRBC: 0 % (ref 0.0–0.2)

## 2020-01-06 LAB — COMPREHENSIVE METABOLIC PANEL
ALT: 170 U/L — ABNORMAL HIGH (ref 0–44)
AST: 299 U/L — ABNORMAL HIGH (ref 15–41)
Albumin: 2.2 g/dL — ABNORMAL LOW (ref 3.5–5.0)
Alkaline Phosphatase: 94 U/L (ref 38–126)
Anion gap: 16 — ABNORMAL HIGH (ref 5–15)
BUN: 38 mg/dL — ABNORMAL HIGH (ref 8–23)
CO2: 16 mmol/L — ABNORMAL LOW (ref 22–32)
Calcium: 8.2 mg/dL — ABNORMAL LOW (ref 8.9–10.3)
Chloride: 109 mmol/L (ref 98–111)
Creatinine, Ser: 1.95 mg/dL — ABNORMAL HIGH (ref 0.44–1.00)
GFR, Estimated: 25 mL/min — ABNORMAL LOW (ref >60)
Glucose, Bld: 130 mg/dL — ABNORMAL HIGH (ref 70–99)
Potassium: 4.8 mmol/L (ref 3.5–5.1)
Sodium: 141 mmol/L (ref 135–145)
Total Bilirubin: 0.8 mg/dL (ref 0.3–1.2)
Total Protein: 6.7 g/dL (ref 6.5–8.1)

## 2020-01-06 LAB — URINALYSIS, ROUTINE W REFLEX MICROSCOPIC
Bilirubin Urine: NEGATIVE
Glucose, UA: NEGATIVE mg/dL
Ketones, ur: NEGATIVE mg/dL
Nitrite: NEGATIVE
Protein, ur: 30 mg/dL — AB
Specific Gravity, Urine: 1.014 (ref 1.005–1.030)
WBC, UA: >50 WBC/hpf — ABNORMAL HIGH (ref 0–5)
pH: 5 (ref 5.0–8.0)

## 2020-01-06 LAB — CBG MONITORING, ED
Glucose-Capillary: 107 mg/dL — ABNORMAL HIGH (ref 70–99)
Glucose-Capillary: 93 mg/dL (ref 70–99)

## 2020-01-06 LAB — IRON AND TIBC
Iron: 16 ug/dL — ABNORMAL LOW (ref 28–170)
Saturation Ratios: 12 % (ref 10.4–31.8)
TIBC: 136 ug/dL — ABNORMAL LOW (ref 250–450)
UIBC: 120 ug/dL

## 2020-01-06 LAB — HEMOGLOBIN A1C
Hgb A1c MFr Bld: 5.6 % (ref 4.8–5.6)
Mean Plasma Glucose: 114.02 mg/dL

## 2020-01-06 LAB — PROTIME-INR
INR: 1.4 — ABNORMAL HIGH (ref 0.8–1.2)
Prothrombin Time: 16.9 seconds — ABNORMAL HIGH (ref 11.4–15.2)

## 2020-01-06 LAB — SEDIMENTATION RATE: Sed Rate: >140 mm/h — ABNORMAL HIGH (ref 0–22)

## 2020-01-06 LAB — APTT: aPTT: 36 seconds (ref 24–36)

## 2020-01-06 LAB — RESP PANEL BY RT-PCR (FLU A&B, COVID) ARPGX2
Influenza A by PCR: NEGATIVE
Influenza B by PCR: NEGATIVE
SARS Coronavirus 2 by RT PCR: NEGATIVE

## 2020-01-06 LAB — C-REACTIVE PROTEIN: CRP: 12.8 mg/dL — ABNORMAL HIGH

## 2020-01-06 LAB — LACTIC ACID, PLASMA
Lactic Acid, Venous: 2 mmol/L (ref 0.5–1.9)
Lactic Acid, Venous: 1.6 mmol/L (ref 0.5–1.9)

## 2020-01-06 LAB — FERRITIN: Ferritin: 6384 ng/mL — ABNORMAL HIGH (ref 11–307)

## 2020-01-06 MED ORDER — ACETAMINOPHEN 325 MG PO TABS
650.0000 mg | ORAL_TABLET | Freq: Four times a day (QID) | ORAL | Status: DC | PRN
  Administered 2020-01-08 – 2020-01-11 (×2): 650 mg via ORAL
  Filled 2020-01-06 (×2): qty 2

## 2020-01-06 MED ORDER — ADULT MULTIVITAMIN W/MINERALS CH
1.0000 | ORAL_TABLET | Freq: Every day | ORAL | Status: DC
  Administered 2020-01-07 – 2020-01-08 (×2): 1 via ORAL
  Filled 2020-01-06 (×2): qty 1

## 2020-01-06 MED ORDER — LACTATED RINGERS IV BOLUS
1000.0000 mL | Freq: Once | INTRAVENOUS | Status: AC
  Administered 2020-01-06: 1000 mL via INTRAVENOUS

## 2020-01-06 MED ORDER — LACTATED RINGERS IV BOLUS (SEPSIS)
1000.0000 mL | Freq: Once | INTRAVENOUS | Status: AC
  Administered 2020-01-06: 1000 mL via INTRAVENOUS

## 2020-01-06 MED ORDER — VANCOMYCIN HCL IN DEXTROSE 1-5 GM/200ML-% IV SOLN
1000.0000 mg | INTRAVENOUS | Status: DC
  Administered 2020-01-08: 1000 mg via INTRAVENOUS
  Filled 2020-01-06 (×2): qty 200

## 2020-01-06 MED ORDER — PIPERACILLIN-TAZOBACTAM IN DEX 2-0.25 GM/50ML IV SOLN
2.2500 g | Freq: Three times a day (TID) | INTRAVENOUS | Status: DC
  Administered 2020-01-06 – 2020-01-07 (×3): 2.25 g via INTRAVENOUS
  Filled 2020-01-06 (×4): qty 50

## 2020-01-06 MED ORDER — HEPARIN SODIUM (PORCINE) 5000 UNIT/ML IJ SOLN
5000.0000 [IU] | Freq: Three times a day (TID) | INTRAMUSCULAR | Status: DC
  Administered 2020-01-06 – 2020-01-09 (×9): 5000 [IU] via SUBCUTANEOUS
  Filled 2020-01-06 (×9): qty 1

## 2020-01-06 MED ORDER — ZINC OXIDE 11.3 % EX CREA
1.0000 "application " | TOPICAL_CREAM | Freq: Every day | CUTANEOUS | Status: DC
  Filled 2020-01-06: qty 56

## 2020-01-06 MED ORDER — IOHEXOL 9 MG/ML PO SOLN: 1000.0000 mL | ORAL | Status: AC

## 2020-01-06 MED ORDER — LACTATED RINGERS IV SOLN: INTRAVENOUS | Status: DC

## 2020-01-06 MED ORDER — PANTOPRAZOLE SODIUM 40 MG PO TBEC
40.0000 mg | DELAYED_RELEASE_TABLET | Freq: Every day | ORAL | Status: DC
  Administered 2020-01-06 – 2020-01-11 (×6): 40 mg via ORAL
  Filled 2020-01-06 (×6): qty 1

## 2020-01-06 MED ORDER — PIPERACILLIN-TAZOBACTAM 3.375 G IVPB
3.3750 g | Freq: Once | INTRAVENOUS | Status: AC
  Administered 2020-01-06: 3.375 g via INTRAVENOUS
  Filled 2020-01-06: qty 50

## 2020-01-06 MED ORDER — ACETAMINOPHEN 650 MG RE SUPP: 650.0000 mg | Freq: Four times a day (QID) | RECTAL | Status: DC | PRN

## 2020-01-06 MED ORDER — IOHEXOL 9 MG/ML PO SOLN
ORAL | Status: AC
  Administered 2020-01-06: 1000 mL via ORAL
  Filled 2020-01-06: qty 1000

## 2020-01-06 MED ORDER — LACTATED RINGERS IV BOLUS (SEPSIS): 500.0000 mL | Freq: Once | INTRAVENOUS | Status: DC

## 2020-01-06 MED ORDER — INSULIN ASPART 100 UNIT/ML ~~LOC~~ SOLN
0.0000 [IU] | Freq: Three times a day (TID) | SUBCUTANEOUS | Status: DC
  Filled 2020-01-06: qty 0.06

## 2020-01-06 MED ORDER — VANCOMYCIN HCL IN DEXTROSE 1-5 GM/200ML-% IV SOLN
1000.0000 mg | Freq: Once | INTRAVENOUS | Status: AC
  Administered 2020-01-06: 1000 mg via INTRAVENOUS
  Filled 2020-01-06: qty 200

## 2020-01-06 MED ORDER — BISACODYL 10 MG RE SUPP: 10.0000 mg | Freq: Every day | RECTAL | Status: DC | PRN

## 2020-01-06 MED ORDER — ACETAMINOPHEN 325 MG PO TABS
650.0000 mg | ORAL_TABLET | Freq: Once | ORAL | Status: AC
  Administered 2020-01-06: 650 mg via ORAL
  Filled 2020-01-06: qty 2

## 2020-01-06 MED ORDER — NOREPINEPHRINE 4 MG/250ML-% IV SOLN
0.0000 ug/min | INTRAVENOUS | Status: DC
  Administered 2020-01-06: 2 ug/min via INTRAVENOUS
  Administered 2020-01-07: 3 ug/min via INTRAVENOUS
  Filled 2020-01-06 (×2): qty 250
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
## 2020-01-06 NOTE — ED Provider Notes (Signed)
Care received from [facility].  Please see her note for full HPI  In short, 84 year old female who presents from a SNF with altered mental status and hypoxia.  On arrival was noted to be tachycardic and hypotensive and a code sepsis was initiated.  Work-up by previous provider showed a CBC with a leukocytosis of 14.5, hemoglobin 9.4 which appears to be stable.  CMP without any electrolyte abnormalities, creatinine of 1.95 and a BUN of 38 consistent with an AKI.  She also has transaminitis with an AST of 299 and ALT of 170.  Anion gap elevated at 16.  INR of 1.4.  Covid was negative.  Lactic negative.  Chest x-ray was negative for infection Patient was started on IV fluids, broad-spectrum antibiotics as there was no clear source.  Per chart review, patient did have a history of sepsis from a UTI.     Care received with pending CT of the abdomen, she had received appropriate fluid resuscitation per sepsis protocol and still was hypotensive.  Plan was to reevaluate blood pressure and possibly start vasopressors.  Previous provider had contacted the patient's family and discussed plan of care and they were agreeable.  Called to bedside as nurses noted a large sacral wound.  Per chart review, wound does appear larger than photos in chart from 11/2.  UA consistent with UTI.  CT of the abdomen with distal left ureteral stone.  Patient's blood pressures improved to 117/61.  Consulted hospitalist Dr. Francesco Sor who will admit the patient further management and treatment.   Case discussed with my supervising physician Dr. Zenia Resides who is agreeable to the above plan and disposition Physical Exam  BP 117/61   Pulse 85   Temp 97.7 F (36.5 C) (Oral)   Resp (!) 29   Ht 5\' 4"  (1.626 m)   Wt 63.5 kg   SpO2 98%   BMI 24.03 kg/m   Physical Exam Vitals and nursing note reviewed.  Constitutional:      General: She is not in acute distress.    Appearance: She is well-developed. She is ill-appearing. She is  not toxic-appearing or diaphoretic.  HENT:     Head: Normocephalic and atraumatic.     Nose: Nose normal.     Mouth/Throat:     Mouth: Mucous membranes are moist.     Pharynx: Oropharynx is clear.     Comments: Dry mucous membranes Eyes:     Conjunctiva/sclera: Conjunctivae normal.  Cardiovascular:     Rate and Rhythm: Normal rate and regular rhythm.     Pulses: Normal pulses.     Heart sounds: Normal heart sounds. No murmur heard.   Pulmonary:     Effort: Pulmonary effort is normal. No respiratory distress.     Breath sounds: Normal breath sounds.  Abdominal:     Palpations: Abdomen is soft.     Tenderness: There is abdominal tenderness (generalized ).  Musculoskeletal:     Cervical back: Neck supple.  Skin:    General: Skin is warm and dry.     Comments: Large stage 4 sacral wound approximately 10-12cm   Neurological:     Mental Status: She is alert.       ED Course/Procedures    Results for orders placed or performed during the hospital encounter of 01/06/20  Resp Panel by RT-PCR (Flu A&B, Covid) Nasopharyngeal Swab   Specimen: Nasopharyngeal Swab; Nasopharyngeal(NP) swabs in vial transport medium  Result Value Ref Range   SARS Coronavirus 2 by RT PCR  NEGATIVE NEGATIVE   Influenza A by PCR NEGATIVE NEGATIVE   Influenza B by PCR NEGATIVE NEGATIVE  Lactic acid, plasma  Result Value Ref Range   Lactic Acid, Venous 2.0 (HH) 0.5 - 1.9 mmol/L  Lactic acid, plasma  Result Value Ref Range   Lactic Acid, Venous 1.6 0.5 - 1.9 mmol/L  Comprehensive metabolic panel  Result Value Ref Range   Sodium 141 135 - 145 mmol/L   Potassium 4.8 3.5 - 5.1 mmol/L   Chloride 109 98 - 111 mmol/L   CO2 16 (L) 22 - 32 mmol/L   Glucose, Bld 130 (H) 70 - 99 mg/dL   BUN 38 (H) 8 - 23 mg/dL   Creatinine, Ser 1.95 (H) 0.44 - 1.00 mg/dL   Calcium 8.2 (L) 8.9 - 10.3 mg/dL   Total Protein 6.7 6.5 - 8.1 g/dL   Albumin 2.2 (L) 3.5 - 5.0 g/dL   AST 299 (H) 15 - 41 U/L   ALT 170 (H) 0 - 44 U/L    Alkaline Phosphatase 94 38 - 126 U/L   Total Bilirubin 0.8 0.3 - 1.2 mg/dL   GFR, Estimated 25 (L) >60 mL/min   Anion gap 16 (H) 5 - 15  CBC WITH DIFFERENTIAL  Result Value Ref Range   WBC 14.5 (H) 4.0 - 10.5 K/uL   RBC 3.18 (L) 3.87 - 5.11 MIL/uL   Hemoglobin 9.4 (L) 12.0 - 15.0 g/dL   HCT 31.7 (L) 36 - 46 %   MCV 99.7 80.0 - 100.0 fL   MCH 29.6 26.0 - 34.0 pg   MCHC 29.7 (L) 30.0 - 36.0 g/dL   RDW 14.7 11.5 - 15.5 %   Platelets 518 (H) 150 - 400 K/uL   nRBC 0.0 0.0 - 0.2 %   Neutrophils Relative % 80 %   Neutro Abs 11.6 (H) 1.7 - 7.7 K/uL   Lymphocytes Relative 13 %   Lymphs Abs 1.9 0.7 - 4.0 K/uL   Monocytes Relative 6 %   Monocytes Absolute 0.8 0.1 - 1.0 K/uL   Eosinophils Relative 0 %   Eosinophils Absolute 0.0 0.0 - 0.5 K/uL   Basophils Relative 0 %   Basophils Absolute 0.0 0.0 - 0.1 K/uL   Immature Granulocytes 1 %   Abs Immature Granulocytes 0.21 (H) 0.00 - 0.07 K/uL  Protime-INR  Result Value Ref Range   Prothrombin Time 16.9 (H) 11.4 - 15.2 seconds   INR 1.4 (H) 0.8 - 1.2  APTT  Result Value Ref Range   aPTT 36 24 - 36 seconds  Urinalysis, Routine w reflex microscopic  Result Value Ref Range   Color, Urine YELLOW YELLOW   APPearance TURBID (A) CLEAR   Specific Gravity, Urine 1.014 1.005 - 1.030   pH 5.0 5.0 - 8.0   Glucose, UA NEGATIVE NEGATIVE mg/dL   Hgb urine dipstick SMALL (A) NEGATIVE   Bilirubin Urine NEGATIVE NEGATIVE   Ketones, ur NEGATIVE NEGATIVE mg/dL   Protein, ur 30 (A) NEGATIVE mg/dL   Nitrite NEGATIVE NEGATIVE   Leukocytes,Ua LARGE (A) NEGATIVE   RBC / HPF 21-50 0 - 5 RBC/hpf   WBC, UA >50 (H) 0 - 5 WBC/hpf   Bacteria, UA MANY (A) NONE SEEN   Squamous Epithelial / LPF 21-50 0 - 5   WBC Clumps PRESENT    Budding Yeast PRESENT    CT ABDOMEN PELVIS WO CONTRAST  Result Date: 01/06/2020 CLINICAL DATA:  Abdominal pain EXAM: CT ABDOMEN AND PELVIS WITHOUT CONTRAST TECHNIQUE: Multidetector CT  imaging of the abdomen and pelvis was performed  following the standard protocol without IV contrast. COMPARISON:  12/06/2019 FINDINGS: Lower chest: Mild bibasilar atelectasis is noted. No focal confluent infiltrate is seen. Hepatobiliary: No focal liver abnormality is seen. No gallstones, gallbladder wall thickening, or biliary dilatation. Pancreas: Unremarkable. No pancreatic ductal dilatation or surrounding inflammatory changes. Spleen: Normal in size without focal abnormality. Adrenals/Urinary Tract: Adrenal glands are within normal limits. Kidneys demonstrate bilateral renal calculi. Mild fullness of the collecting systems is noted bilaterally. Bilateral renal pelvic stones are noted. The ureters are well visualized bilaterally with a small 2-3 mm stone at the left UVJ. Multiple small calculi are now seen in the bladder which have passed from the left ureter on the prior study. Stomach/Bowel: Considerable fecal material is noted within the rectum consistent with a mild degree of impaction. No obstructive changes are seen. No inflammatory changes are noted. Endoscopic clip is noted in the rectum as well. No obstructive or inflammatory changes of the colon are seen. The appendix is not well visualized. No inflammatory changes to suggest appendicitis are noted. Small bowel is unremarkable. Small hiatal hernia is noted. Vascular/Lymphatic: Aortic atherosclerosis. No enlarged abdominal or pelvic lymph nodes. Reproductive: Status post hysterectomy. No adnexal masses. Other: No abdominal wall hernia or abnormality. No abdominopelvic ascites. Musculoskeletal: Postsurgical changes are noted in the proximal left femur and lumbar spine. Degenerative changes are noted. No acute abnormality is seen. Chronic L1 compression deformity is noted. IMPRESSION: Distal left ureteral stone with minimal fullness of the left collecting system. Multiple calculi are noted within the bladder that have passed in the interval from the prior exam from the distal left ureter. Multiple renal  calculi bilaterally. Renal pelvic stones are noted bilaterally without definitive obstructive change. Mild bibasilar atelectasis. Electronically Signed   By: Inez Catalina M.D.   On: 01/06/2020 08:31   DG Chest Port 1 View  Result Date: 01/06/2020 CLINICAL DATA:  Fever.  Possible sepsis. EXAM: PORTABLE CHEST 1 VIEW COMPARISON:  Chest x-ray dated 11/26/2019. FINDINGS: Heart size and mediastinal contours are stable. Lungs are clear. No pleural effusion. Osseous structures about the chest are unremarkable. IMPRESSION: No active disease.  No evidence of pneumonia. Electronically Signed   By: Franki Cabot M.D.   On: 01/06/2020 05:36    .Critical Care Performed by: Garald Balding, PA-C Authorized by: Garald Balding, PA-C   Critical care provider statement:    Critical care time (minutes):  45   Critical care was necessary to treat or prevent imminent or life-threatening deterioration of the following conditions:  Circulatory failure, metabolic crisis, sepsis and shock   Critical care was time spent personally by me on the following activities:  Discussions with consultants, evaluation of patient's response to treatment, examination of patient, ordering and performing treatments and interventions, ordering and review of laboratory studies, ordering and review of radiographic studies, pulse oximetry, re-evaluation of patient's condition, obtaining history from patient or surrogate and review of old charts    MDM         Garald Balding, PA-C 01/06/20 1004    Lacretia Leigh, MD 01/07/20 1333

## 2020-01-06 NOTE — Progress Notes (Signed)
Pharmacy Antibiotic Note  Ana Newman is a 84 y.o. female admitted on 01/06/2020 with sepsis.  Pharmacy has been consulted for vancomycin and zosyn  dosing.  Plan:  Zosyn 3.375 gr IV x1, then 2.25 gr IV q8h   Vancomycin 1000 mg IV IV q48h  Daily SCr while on vancomycin and Zosyn   Monitor clinical course, renal function, cultures as available   Height: 5\' 4"  (162.6 cm) Weight: 63.5 kg (140 lb) IBW/kg (Calculated) : 54.7  Temp (24hrs), Avg:98.6 F (37 C), Min:97.7 F (36.5 C), Max:100.5 F (38.1 C)  Recent Labs  Lab 01/06/20 0427 01/06/20 0550  WBC 14.5*  --   CREATININE 1.95*  --   LATICACIDVEN 2.0* 1.6    Estimated Creatinine Clearance: 19.2 mL/min (A) (by C-G formula based on SCr of 1.95 mg/dL (H)).    Allergies  Allergen Reactions  . 5-Alpha Reductase Inhibitors   . Chocolate   . Pea     Antimicrobials this admission: 12/5 vancomycin >>  12/5 Zosyn >>   Dose adjustments this admission:   Microbiology results: 12/5 BCx:    Thank you for allowing pharmacy to be a part of this patient's care.   Royetta Asal, PharmD, BCPS 01/06/2020 10:27 AM

## 2020-01-06 NOTE — Progress Notes (Signed)
A consult was received from an ED physician for Vancomycin per pharmacy dosing.  The patient's profile has been reviewed for ht/wt/allergies/indication/available labs.   A one time order has been placed for Vancomycin 1gm IV.  Further antibiotics/pharmacy consults should be ordered by admitting physician if indicated.                       Thank you, Netta Cedars PharmD 01/06/2020  4:32 AM

## 2020-01-06 NOTE — ED Triage Notes (Signed)
Staff at [facility] noticed that pt was not mentating appropriately at 11pm. Her O2 sats were in the 80s. Staff put her on 2L, bringing sat up to 90. A few hours later staff noticed she had a fever. Pt hospitalized a month ago with a UTI. Has lab values with elevated white blood cell count from yesterday. BP 80/50 with EMS. Sinus tach with peaked T waves. RR 60.

## 2020-01-06 NOTE — ED Provider Notes (Signed)
[facility] DEPT Provider Note   CSN: [id number] Arrival date & time: 01/06/20  0408     History Chief Complaint  Patient presents with  . Fever    Ana Newman is a 84 y.o. female with history of Alzheimer's disease, diabetes mellitus, hypertension, hyperlipidemia, hypothyroidism, prior sepsis and grade 1 diastolic dysfunction, last EF 55 to 60% who presents to the emergency department via EMS from [facility] care facility for evaluation of fever noted this morning.  Per EMS report staff noted patient to have hypoxia into the 80s on room air around 2300 last night, they placed her on 2 L via nasal cannula with improvement, however a few hours later they noted her to be febrile prompting EMS call.  Per EMS saturating well on 2 L via nasal cannula, systolic BP initially in the low 100s, febrile to 104, and tachycardic.  Given 500 cc of normal saline in route.  Upon discussion with the patient she states that she has had some mild dysuria and abdominal pain.  She denies vomiting, diarrhea, melena, hematochezia, chest pain, dyspnea, or cough.  Patient has a history of Alzheimer's, per EMS from stat facility she is at her mental status baseline at this time.  HPI     Past Medical History:  Diagnosis Date  . Acute pyelonephritis   . Alzheimer's disease ([place])    alzheimer's dementia   . Bilateral foot-drop   . Bladder outlet obstruction   . Cholelithiasis   . Constipation   . Depression   . Diabetes mellitus without complication ([place])    type 2   . Diabetic ulcer of right buttock ([place])   . Diarrhea   . Dysrhythmia    ? hx of afib postop 1/31-03/06/18   . Environmental and seasonal allergies   . Essential hypertension, benign   . Foley catheter in place   . GERD (gastroesophageal reflux disease)   . History of kidney stones   . History of urinary tract infection   . Hyperlipidemia   . Hypertension   . Hypothyroidism   . Incontinence    wears  diaper   . Insomnia   . Nephrolithiasis   . Osteoarthritis   . Pyelonephritis   . Reflux esophagitis   . Rickets, active   . Sepsis ([place])   . Sepsis ([place])    hx of   . Unspecified constipation   . Unspecified psychosis   . Unspecified vitamin D deficiency   . Vitamin D deficiency     Patient Active Problem List   Diagnosis Date Noted  . Anemia due to blood loss 11/25/2019  . Pressure injury of skin 11/23/2019  . Pyelonephritis 03/03/2018  . Leukocytosis 03/03/2018  . Ureterolithiasis 03/03/2018  . Late onset Alzheimer's disease without behavioral disturbance ([place]) 09/18/2017  . Major depression with psychotic features ([place]) 03/14/2017  . Weight loss, non-intentional 10/25/2016  . Chronic constipation 10/08/2016  . Hypertension associated with diabetes ([place]) 05/14/2016  . Bladder outlet obstruction 06/17/2015  . Nephrolithiasis 05/18/2015  . Sepsis due to urinary tract infection ([place]) 05/17/2015  . AKI (acute kidney injury) ([place]) 05/17/2015  . Environmental and seasonal allergies 04/06/2015  . Dyslipidemia associated with type 2 diabetes mellitus ([place]) 02/04/2015  . Diabetes mellitus, type II, insulin dependent ([place]) 02/04/2015  . Depression 01/18/2014  . Hypothyroidism 01/18/2014  . Psychosis ([place])   . Osteoarthritis     Past Surgical History:  Procedure Laterality Date  . ABDOMINAL HYSTERECTOMY    .  BIOPSY  12/07/2019   Procedure: BIOPSY;  Surgeon: Otis Brace, MD;  Location: [facility];  Service: Gastroenterology;;  . COLONOSCOPY WITH PROPOFOL N/A 12/07/2019   Procedure: COLONOSCOPY WITH PROPOFOL;  Surgeon: Otis Brace, MD;  Location: [facility];  Service: Gastroenterology;  Laterality: N/A;  . CYSTOSCOPY W/ URETERAL STENT PLACEMENT Bilateral 05/18/2015   Procedure: CYSTOSCOPY WITH RETROGRADE PYELOGRAM/URETERAL STENT PLACEMENT;  Surgeon: Carolan Clines, MD;  Location: [facility];  Service: Urology;  Laterality: Bilateral;  . CYSTOSCOPY W/ URETERAL  STENT PLACEMENT Bilateral 03/03/2018   Procedure: CYSTOSCOPY WITH RETROGRADE PYELOGRAM/URETERAL STENT PLACEMENT;  Surgeon: Franchot Gallo, MD;  Location: [facility];  Service: Urology;  Laterality: Bilateral;  . CYSTOSCOPY W/ URETERAL STENT REMOVAL Bilateral 09/12/2015   Procedure: CYSTOSCOPY WITH STENT REMOVAL;  Surgeon: Carolan Clines, MD;  Location: [facility];  Service: Urology;  Laterality: Bilateral;  1 HOUR  . CYSTOSCOPY WITH RETROGRADE PYELOGRAM, URETEROSCOPY AND STENT PLACEMENT Bilateral 07/18/2015   Procedure: CYSTOSCOPY REMOVE LEFT DOUBLE J STENT LEFT RETROGRADE PYELOGRAM, LEFT URETEROSCOPY, PYELOSCOPY, REPLACEMENT OF LEFT DOUBLE J STENT;  Surgeon: Carolan Clines, MD;  Location: [facility];  Service: Urology;  Laterality: Bilateral;  . CYSTOSCOPY WITH RETROGRADE PYELOGRAM, URETEROSCOPY AND STENT PLACEMENT Bilateral 06/08/2018   Procedure: CYSTOSCOPY WITH RETROGRADE PYELOGRAM, URETEROSCOPY AND STENT PLACEMENT;  Surgeon: Franchot Gallo, MD;  [facility]: [facility];  Service: Urology;  Laterality: Bilateral;  90 MINS  . ESOPHAGOGASTRODUODENOSCOPY (EGD) WITH PROPOFOL N/A 12/07/2019   Procedure: ESOPHAGOGASTRODUODENOSCOPY (EGD) WITH PROPOFOL;  Surgeon: Otis Brace, MD;  [facility];  Service: Gastroenterology;  Laterality: N/A;  . HEMOSTASIS CLIP PLACEMENT  12/07/2019   Procedure: HEMOSTASIS CLIP PLACEMENT;  Surgeon: Otis Brace, MD;  [facility];  Service: Gastroenterology;;  . HOLMIUM LASER APPLICATION Bilateral 7/51/7001   Procedure: HOLMIUM LASER OF LEFT LOWER URETERAL STONE, LEFT PYELOSCOPY, REPLACE LEFT DOUBLE J STENT REMOVE RIGHT DOUBLE J STENT RIGHT RETROGRADE PYELOGRAM, REPLACEMENT RIGHT DOUBLE J STENT ;  Surgeon: Carolan Clines, MD;  Location[facility];  Service: Urology;  Laterality: Bilateral;  . HOLMIUM LASER APPLICATION Bilateral 08/05/9447   Procedure: HOLMIUM LASER APPLICATION;  Surgeon: Franchot Gallo, MD;  Location: [facility];  Service: Urology;   Laterality: Bilateral;  . KNEE ARTHROSCOPY Left 1997  . POLYPECTOMY  12/07/2019   Procedure: POLYPECTOMY;  Surgeon: Otis Brace, MD;  [facility];  Service: Gastroenterology;;     OB History   No obstetric history on file.     Family History  Family history unknown: Yes    Social History   Tobacco Use  . Smoking status: Never Smoker  . Smokeless tobacco: Never Used  Substance Use Topics  . Alcohol use: No  . Drug use: No    Home Medications Prior to Admission medications   Medication Sig Start Date End Date Taking? Authorizing Provider  acetaminophen (TYLENOL) 325 MG tablet Take 2 tablets (650 mg total) by mouth every 6 (six) hours as needed for mild pain (or Fever >/= 101). 12/10/19   Cherene Altes, MD  bisacodyl (DULCOLAX) 10 MG suppository Place 10 mg rectally daily as needed for moderate constipation.     [provider]  Cholecalciferol (VITAMIN D3) 50 MCG (2000 UT) TABS Take 2,000 Units by mouth daily.    [provider]  collagenase (SANTYL) ointment Apply topically daily. 12/02/19   Kayleen Memos, DO  Cranberry 450 MG CAPS Take 450 mg by mouth daily.    [provider]  Multiple Vitamin (MULTIVITAMIN WITH MINERALS) TABS tablet Take 1 tablet  by mouth daily.    [provider]  Nutritional Supplements (PROMOD) LIQD Take 30 mLs by mouth 2 (two) times daily.    [provider]  omeprazole (PRILOSEC) 20 MG capsule Take 20 mg by mouth daily.    [provider]  ondansetron (ZOFRAN) 4 MG tablet Take 4 mg by mouth every 6 (six) hours as needed for nausea or vomiting.    [provider]  polyethylene glycol (MIRALAX / GLYCOLAX) packet Take 17 g by mouth every 12 (twelve) hours as needed for mild constipation.    [provider]  Polyvinyl Alcohol-Povidone (FRESHKOTE) 2.7-2 % SOLN Apply 1 drop to eye 3 (three) times daily. To both eyes    [provider]  rosuvastatin (CRESTOR)  10 MG tablet Take 10 mg by mouth daily.    [provider]  ZINC OXIDE, TOPICAL, 10 % CREA Apply 1 application topically in the morning and at bedtime.    [provider]    Allergies    5-alpha reductase inhibitors, Chocolate, and Pea  Review of Systems   Review of Systems  Constitutional: Positive for fever.  Respiratory: Negative for cough and shortness of breath.   Cardiovascular: Negative for chest pain.  Gastrointestinal: Positive for abdominal pain. Negative for blood in stool, constipation, diarrhea and vomiting.  Genitourinary: Positive for dysuria.  All other systems reviewed and are negative.   Physical Exam Updated Vital Signs BP (!) 85/50 (BP Location: Left Arm)   Pulse (!) 120   Temp (!) 100.5 F (38.1 C) (Oral)   Ht 5\' 4"  (1.626 m)   Wt 63.5 kg   SpO2 96%   BMI 24.03 kg/m   Physical Exam Vitals and nursing note reviewed.  Constitutional:      Appearance: She is ill-appearing.  HENT:     Head: Normocephalic and atraumatic.     Nose: Nose normal.     Mouth/Throat:     Mouth: Mucous membranes are dry.  Eyes:     Pupils: Pupils are equal, round, and reactive to light.  Cardiovascular:     Rate and Rhythm: Regular rhythm. Tachycardia present.     Comments: 2+ radial pulses. Pulmonary:     Effort: No respiratory distress.     Breath sounds: No wheezing, rhonchi or rales.     Comments: SPO2 96% on 2 L via nasal cannula. Abdominal:     General: There is no distension.     Palpations: Abdomen is soft.     Tenderness: There is abdominal tenderness (mild generalized). There is no guarding or rebound.  Musculoskeletal:     Cervical back: Neck supple. No rigidity.  Skin:    Comments: Delayed capillary refill.  Neurological:     Mental Status: She is alert.     Comments: Extremity contractures noted.   Psychiatric:        Behavior: Behavior normal. Behavior is cooperative.    ED Results / Procedures / Treatments   Labs (all labs  ordered are listed, but only abnormal results are displayed) Labs Reviewed  CBC WITH DIFFERENTIAL/PLATELET - Abnormal; Notable for the following components:      Result Value   WBC 14.5 (*)    RBC 3.18 (*)    Hemoglobin 9.4 (*)    HCT 31.7 (*)    MCHC 29.7 (*)    Platelets 518 (*)    Neutro Abs 11.6 (*)    Abs Immature Granulocytes 0.21 (*)    All other components within  normal limits  CULTURE, BLOOD (ROUTINE X 2)  CULTURE, BLOOD (ROUTINE X 2)  URINE CULTURE  RESP PANEL BY RT-PCR (FLU A&B, COVID) ARPGX2  LACTIC ACID, PLASMA  LACTIC ACID, PLASMA  COMPREHENSIVE METABOLIC PANEL  PROTIME-INR  APTT  URINALYSIS, ROUTINE W REFLEX MICROSCOPIC    EKG None  Radiology DG Chest Port 1 View  Result Date: 01/06/2020 CLINICAL DATA:  Fever.  Possible sepsis. EXAM: PORTABLE CHEST 1 VIEW COMPARISON:  Chest x-ray dated 11/26/2019. FINDINGS: Heart size and mediastinal contours are stable. Lungs are clear. No pleural effusion. Osseous structures about the chest are unremarkable. IMPRESSION: No active disease.  No evidence of pneumonia. Electronically Signed   By: Franki Cabot M.D.   On: 01/06/2020 05:36    Procedures .Critical Care Performed by: Amaryllis Dyke, PA-C Authorized by: Amaryllis Dyke, PA-C     CRITICAL CARE Performed by: Kennith Maes   Total critical care time: 50 minutes  Critical care time was exclusive of separately billable procedures and treating other patients.  Critical care was necessary to treat or prevent imminent or life-threatening deterioration.  Critical care was time spent personally by me on the following activities: development of treatment plan with patient and/or surrogate as well as nursing, discussions with consultants, evaluation of patient's response to treatment, examination of patient, obtaining history from patient or surrogate, ordering and performing treatments and interventions, ordering and review of laboratory studies,  ordering and review of radiographic studies, pulse oximetry and re-evaluation of patient's condition. (including critical care time)  Medications Ordered in ED Medications  lactated ringers infusion (has no administration in time range)  lactated ringers bolus 1,000 mL (1,000 mLs Intravenous New Bag/Given 01/06/20 0445)    And  lactated ringers bolus 1,000 mL (0 mLs Intravenous Stopped 01/06/20 0640)  piperacillin-tazobactam (ZOSYN) IVPB 3.375 g (0 g Intravenous Stopped 01/06/20 0517)  vancomycin (VANCOCIN) IVPB 1000 mg/200 mL premix (0 mg Intravenous Stopped 01/06/20 0627)  acetaminophen (TYLENOL) tablet 650 mg (650 mg Oral Given 01/06/20 0457)  iohexol (OMNIPAQUE) 9 MG/ML oral solution 1,000 mL (1,000 mLs Oral Contrast Given 01/06/20 0535)  lactated ringers bolus 1,000 mL (0 mLs Intravenous Stopped 01/06/20 9323)    ED Course  I have reviewed the triage vital signs and the nursing notes.  Pertinent labs & imaging results that were available during my care of the patient were reviewed by me and considered in my medical decision making (see chart for details).    MDM Rules/Calculators/A&P                         Patient presents to the ED from skilled nursing care facility for evaluation of fever and hypoxia which she developed overnight.  On arrival patient is somewhat ill-appearing, she is febrile, tachycardic, and hypotensive with manual BP 85/50.  Lungs are clear.  Diffuse abdominal tenderness.   Code sepsis initiated with broad-spectrum antibiotics and 30 cc/kg fluid bolus.  Of note patient is DNR.  Additional history obtained:  Additional history obtained from EMS. Previous records obtained and reviewed.   Lab Tests:  I Ordered, reviewed, and interpreted labs, which included:  CBC: Leukocytosis with left shift.  Hemoglobin and hematocrit appear similar to prior. CMP: Acute kidney injury, BUN and creatinine more than doubled from previous.  Bicarb 16.  Elevated LFTs are new, total  bilirubin within normal limits. PT/INR: Mild elevation APTT: Within normal limit Lactic acid: Initial 2.0 improving to 1.6 COVID/influenza: Negative  Imaging Studies  ordered:  I ordered imaging studies which included CXR, I independently visualized and interpreted imaging which showed no acute process.  Sepsis - Repeat Assessment Performed at: 06:45 Vitals Blood pressure (!) 83/57, pulse 89, temperature (!) 100.5 F (38.1 C), temperature source Oral, resp. rate (!) 22, height 5\' 4"  (1.626 m), weight 63.5 kg, SpO2 100 %.  Heart: Regular rate and rhythm Lungs:CTA Capillary Refill: 3 seconds Peripheral Pulse:Radial pulse palpable Skin: Dry  Patient's BP remains soft- Additional 1L LR ordered per discussion with Dr. Roxanne Mins, if remains with hypotension following this will likely require pressors.   Called and spoke with patient's daughter Rubie Maid on results and plan of care thus far, in agreement with IV fluids, antibiotics, and pressors if needed.  Provided opportunity for questions, she confirmed understanding & is in agreement.   06:50: Patient care signed out to [facility] at change of shift pending re-assessment, CT A/P, and further management- plan for admission.   This is a shared visit with supervising physician Dr. Roxanne Mins who has independently evaluated patient & provided guidance in evaluation/management/disposition, in agreement with care   Portions of this note were generated with Dragon dictation software. Dictation errors may occur despite best attempts at proofreading.  Final Clinical Impression(s) / ED Diagnoses Final diagnoses:  Sepsis with acute renal failure, due to unspecified organism, unspecified acute renal failure type, unspecified whether septic shock present [facility])    Rx / DC Orders ED Discharge Orders    None       Amaryllis Dyke, PA-C 16/94/50 [id number]    Delora Fuel, MD 28/00/34 [phone]

## 2020-01-06 NOTE — Progress Notes (Signed)
Code Sepsis initiated at [place] AM, [facility] following.

## 2020-01-07 ENCOUNTER — Encounter (HOSPITAL_COMMUNITY): Payer: Self-pay | Admitting: Internal Medicine

## 2020-01-07 DIAGNOSIS — L89150 Pressure ulcer of sacral region, unstageable: Secondary | ICD-10-CM

## 2020-01-07 LAB — CREATININE, SERUM
Creatinine, Ser: 1.39 mg/dL — ABNORMAL HIGH (ref 0.44–1.00)
GFR, Estimated: 38 mL/min — ABNORMAL LOW (ref >60)

## 2020-01-07 LAB — BLOOD CULTURE ID PANEL (REFLEXED) - BCID2

## 2020-01-07 LAB — CBC
HCT: 26.7 % — ABNORMAL LOW (ref 36.0–46.0)
Hemoglobin: 8.3 g/dL — ABNORMAL LOW (ref 12.0–15.0)
MCH: 29.3 pg (ref 26.0–34.0)
MCHC: 31.1 g/dL (ref 30.0–36.0)
MCV: 94.3 fL (ref 80.0–100.0)
Platelets: 388 10*3/uL (ref 150–400)
RBC: 2.83 MIL/uL — ABNORMAL LOW (ref 3.87–5.11)
RDW: 14.7 % (ref 11.5–15.5)
WBC: 13.6 10*3/uL — ABNORMAL HIGH (ref 4.0–10.5)
nRBC: 0 % (ref 0.0–0.2)

## 2020-01-07 LAB — GLUCOSE, CAPILLARY
Glucose-Capillary: 77 mg/dL (ref 70–99)
Glucose-Capillary: 101 mg/dL — ABNORMAL HIGH (ref 70–99)
Glucose-Capillary: 83 mg/dL (ref 70–99)

## 2020-01-07 LAB — COMPREHENSIVE METABOLIC PANEL
ALT: 502 U/L — ABNORMAL HIGH (ref 0–44)
AST: 479 U/L — ABNORMAL HIGH (ref 15–41)
Albumin: 2 g/dL — ABNORMAL LOW (ref 3.5–5.0)
Alkaline Phosphatase: 70 U/L (ref 38–126)
Anion gap: 8 (ref 5–15)
BUN: 22 mg/dL (ref 8–23)
CO2: 20 mmol/L — ABNORMAL LOW (ref 22–32)
Calcium: 7.9 mg/dL — ABNORMAL LOW (ref 8.9–10.3)
Chloride: 106 mmol/L (ref 98–111)
Creatinine, Ser: 1.13 mg/dL — ABNORMAL HIGH (ref 0.44–1.00)
GFR, Estimated: 49 mL/min — ABNORMAL LOW (ref >60)
Glucose, Bld: 102 mg/dL — ABNORMAL HIGH (ref 70–99)
Potassium: 3.3 mmol/L — ABNORMAL LOW (ref 3.5–5.1)
Sodium: 134 mmol/L — ABNORMAL LOW (ref 135–145)
Total Bilirubin: 0.6 mg/dL (ref 0.3–1.2)
Total Protein: 5.6 g/dL — ABNORMAL LOW (ref 6.5–8.1)

## 2020-01-07 LAB — URINE CULTURE

## 2020-01-07 LAB — MRSA PCR SCREENING: MRSA by PCR: NEGATIVE

## 2020-01-07 LAB — CULTURE, BLOOD (ROUTINE X 2): Special Requests: ADEQUATE

## 2020-01-07 LAB — CBG MONITORING, ED: Glucose-Capillary: 75 mg/dL (ref 70–99)

## 2020-01-07 MED ORDER — COLLAGENASE 250 UNIT/GM EX OINT
TOPICAL_OINTMENT | Freq: Every day | CUTANEOUS | Status: DC
  Filled 2020-01-07: qty 30

## 2020-01-07 MED ORDER — LORAZEPAM 2 MG/ML IJ SOLN
1.0000 mg | INTRAMUSCULAR | Status: DC | PRN
  Administered 2020-01-08 – 2020-01-09 (×2): 1 mg via INTRAVENOUS
  Filled 2020-01-07 (×2): qty 1

## 2020-01-07 MED ORDER — NOREPINEPHRINE 4 MG/250ML-% IV SOLN: 2.0000 ug/min | INTRAVENOUS | Status: DC

## 2020-01-07 MED ORDER — ORAL CARE MOUTH RINSE
15.0000 mL | Freq: Two times a day (BID) | OROMUCOSAL | Status: DC
  Administered 2020-01-07 – 2020-01-11 (×8): 15 mL via OROMUCOSAL

## 2020-01-07 MED ORDER — PROSOURCE PLUS PO LIQD
30.0000 mL | Freq: Two times a day (BID) | ORAL | Status: DC
  Administered 2020-01-07 – 2020-01-08 (×2): 30 mL via ORAL
  Filled 2020-01-07 (×3): qty 30

## 2020-01-07 MED ORDER — POLYETHYLENE GLYCOL 3350 17 G PO PACK: 17.0000 g | PACK | Freq: Every day | ORAL | Status: DC | PRN

## 2020-01-07 MED ORDER — ONDANSETRON HCL 4 MG/2ML IJ SOLN: 4.0000 mg | Freq: Four times a day (QID) | INTRAMUSCULAR | Status: DC | PRN

## 2020-01-07 MED ORDER — COLLAGENASE 250 UNIT/GM EX OINT
TOPICAL_OINTMENT | Freq: Every day | CUTANEOUS | Status: DC
  Administered 2020-01-07: 1 via TOPICAL
  Filled 2020-01-07 (×2): qty 30

## 2020-01-07 MED ORDER — CRANBERRY 450 MG PO CAPS: 450.0000 mg | ORAL_CAPSULE | Freq: Every day | ORAL | Status: DC

## 2020-01-07 MED ORDER — CHLORHEXIDINE GLUCONATE CLOTH 2 % EX PADS
6.0000 | MEDICATED_PAD | Freq: Every day | CUTANEOUS | Status: DC
  Administered 2020-01-07 – 2020-01-11 (×5): 6 via TOPICAL

## 2020-01-07 MED ORDER — LORAZEPAM 2 MG/ML IJ SOLN: 2.0000 mg | INTRAMUSCULAR | Status: DC | PRN

## 2020-01-07 MED ORDER — MORPHINE SULFATE (PF) 4 MG/ML IV SOLN
4.0000 mg | INTRAVENOUS | Status: DC | PRN
  Administered 2020-01-08: 4 mg via INTRAVENOUS
  Filled 2020-01-07 (×2): qty 1

## 2020-01-07 MED ORDER — PIPERACILLIN-TAZOBACTAM 3.375 G IVPB
3.3750 g | Freq: Three times a day (TID) | INTRAVENOUS | Status: DC
  Administered 2020-01-07 – 2020-01-09 (×6): 3.375 g via INTRAVENOUS
  Filled 2020-01-07 (×6): qty 50

## 2020-01-07 MED ORDER — SODIUM CHLORIDE 0.9 % IV SOLN
250.0000 mL | INTRAVENOUS | Status: DC
  Administered 2020-01-07: 250 mL via INTRAVENOUS

## 2020-01-07 NOTE — Progress Notes (Signed)
Palliative Medicine RN Note: Note consult order for Ana Newman & that daughter wants hospice, as well as TOC order to set up hospice.   TOC can set up hospice without a PMT consult. Our team is experiencing high consult volume, and Ana Newman will not be seen by our team until 12/7 or possibly 12/8. Please have TOC meet with the family to set up hospice to follow at her location of residence after she is medically stabilized and ready for d/c.  Marjie Skiff Jochebed Bills, RN, BSN, [facility] Palliative Medicine Team 01/07/2020 5:58 PM Office [phone]

## 2020-01-07 NOTE — Plan of Care (Addendum)
New admission to room 1241.   Problem: Education: Goal: Knowledge of General Education information will improve Description: Including pain rating scale, medication(s)/side effects and non-pharmacologic comfort measures Outcome: Progressing   Problem: Health Behavior/Discharge Planning: Goal: Ability to manage health-related needs will improve Outcome: Progressing   Problem: Clinical Measurements: Goal: Ability to maintain clinical measurements within normal limits will improve Outcome: Progressing Goal: Will remain free from infection Outcome: Progressing Goal: Diagnostic test results will improve Outcome: Progressing Goal: Respiratory complications will improve Outcome: Progressing Goal: Cardiovascular complication will be avoided Outcome: Progressing   Problem: Activity: Goal: Risk for activity intolerance will decrease Outcome: Progressing   Problem: Nutrition: Goal: Adequate nutrition will be maintained Outcome: Progressing   Problem: Coping: Goal: Level of anxiety will decrease Outcome: Progressing   Problem: Elimination: Goal: Will not experience complications related to bowel motility Outcome: Progressing Goal: Will not experience complications related to urinary retention Outcome: Progressing   Problem: Pain Managment: Goal: General experience of comfort will improve Outcome: Progressing   Problem: Safety: Goal: Ability to remain free from injury will improve Outcome: Progressing   Problem: Skin Integrity: Goal: Risk for impaired skin integrity will decrease Outcome: Progressing
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
## 2020-01-07 NOTE — Consult Note (Signed)
[facility] Nurse wound consult note Consultation was completed by review of records, images and assistance from the bedside nurse/clinical staff.   Reason for Consult: sacral pressure injury; contamination with bowl and bladder incontinence. Last seen by [facility] team in early November Wound type: Unstageable Pressure Injury Pressure Injury POA: Yes Measurement:see nursing flowsheets Wound bed: 50% pale pink/ 50% dark ruddy;black skin necrosis at wound edges from 12-6 o'clock  Drainage (amount, consistency, odor) serosanguinous, unable to assess accurately in images due to presence of loose stools  Periwound: evidence of extension of deep tissue injury along the right lateral aspect of the buttock; indicative of worsening tissue death deeper in these tissues  Dressing procedure/placement/frequency: 1. Suggested surgical consultation; if aggressive care desired. Consider this a source of infection.   2. Consider MRI to rule out osteomyelitis; exposed bone in the wound base  3. Add air mattress while in the ED (ASAP) for moisture management and pressure redistribution 4. Enzymatic debridement ointment added in the interim of a surgical consultation 5. Possible hydrotherapy for debridement; would prefer surgery to make that decision after their consultation. 6. Consult RD for supplementation for wound healing.  7. Fluff fill wound bed with saline gauze after applying thick layer of Santyl, top with ABD pads, secure with tape.  8. FC in place to manage incontinence 9. Consider fecal management with fecal pouch or flexiseal   Updated admitting MD and bedside nurse via secure chat.  Re consult if needed, will not follow at this time. Thanks  Shannette Tabares [employer], RN,CWOCN, CNS, [place] [phone])

## 2020-01-07 NOTE — Progress Notes (Signed)
PHARMACIST - PHYSICIAN ORDER COMMUNICATION  CONCERNING: P&T Medication Policy on Herbal Medications  DESCRIPTION:  This patient's order for:  Cranberry caps  has been noted.  This product(s) is classified as an "herbal" or natural product. Due to a lack of definitive safety studies or FDA approval, nonstandard manufacturing practices, plus the potential risk of unknown drug-drug interactions while on inpatient medications, the Pharmacy and Therapeutics Committee does not permit the use of "herbal" or natural products of this type within [facility].   ACTION TAKEN: The pharmacy department is unable to verify this order at this time. Please reevaluate patient's clinical condition at discharge and address if the herbal or natural product(s) should be resumed at that time.

## 2020-01-07 NOTE — Progress Notes (Signed)
Pharmacy Antibiotic Note  Ana Newman is a 84 y.o. female admitted on 01/06/2020 with sepsis, sacral wounds.  Pharmacy has been consulted for vancomycin and zosyn  dosing.  Day #2 abx - Tm 100.5, afebrile now - WBC 14.5 - SCr 1.39, improved - Lactate 1.6 Plan:  Adjust Zosyn 3.375gm IV q8h (4hr extended infusions) for CrCl > 20 ml/min   Continue Vancomycin 1000 mg IV q48h  Daily SCr while on vancomycin and Zosyn   Monitor clinical course, renal function, cultures as available   Height: 5\' 4"  (162.6 cm) Weight: 63.5 kg (140 lb) IBW/kg (Calculated) : 54.7  Temp (24hrs), Avg:97.7 F (36.5 C), Min:97.6 F (36.4 C), Max:97.8 F (36.6 C)  Recent Labs  Lab 01/06/20 0427 01/06/20 0550 01/07/20 0420  WBC 14.5*  --   --   CREATININE 1.95*  --  1.39*  LATICACIDVEN 2.0* 1.6  --     Estimated Creatinine Clearance: 26.9 mL/min (A) (by C-G formula based on SCr of 1.39 mg/dL (H)).    Allergies  Allergen Reactions  . 5-Alpha Reductase Inhibitors Other (See Comments)    Unknown reaction  . Chocolate Other (See Comments)    Unknown reaction  . Pea Other (See Comments)    Unknown reaction    Antimicrobials this admission: 12/5 vancomycin >>  12/5 Zosyn >>   Dose adjustments this admission:  Microbiology results: 2/5 BCx: 1/2 GPCC (BCID+ S. Epi- MecA detected) 12/5 Salmonella: neg 12/5 UCx: 12/5 COVID/Flu: neg/neg  Thank you for allowing pharmacy to be a part of this patient's care.  Peggyann Juba, PharmD, BCPS Pharmacy: [phone] 01/07/2020 9:10 AM

## 2020-01-07 NOTE — ED Notes (Signed)
CBG 75. °

## 2020-01-07 NOTE — Progress Notes (Signed)
PHARMACY - PHYSICIAN COMMUNICATION CRITICAL VALUE ALERT - BLOOD CULTURE IDENTIFICATION (BCID)  Ana Newman is an 84 y.o. female who presented to [facility] on 01/06/2020 with a chief complaint of altered mental status and hypoxia  Assessment:  Admitted from nursing facility with worsening sacral wound.   Aerobic bottle of 1 blood cx set now + Stap epidermidis resistant to methicillin.  Likely contaminant but patient is also already on recommended therapy of Vancomycin.  Name of physician (or Provider) Contacted: X Blount  Current antibiotics: Vancomycin + Zosyn  Changes to prescribed antibiotics recommended:  Patient is on recommended antibiotics - No changes needed  Results for orders placed or performed during the hospital encounter of 01/06/20  Blood Culture ID Panel (Reflexed) (Collected: 01/06/2020  4:27 AM)  Result Value Ref Range   Enterococcus faecalis NOT DETECTED NOT DETECTED   Enterococcus Faecium NOT DETECTED NOT DETECTED   Listeria monocytogenes NOT DETECTED NOT DETECTED   Staphylococcus species DETECTED (A) NOT DETECTED   Staphylococcus aureus (BCID) NOT DETECTED NOT DETECTED   Staphylococcus epidermidis DETECTED (A) NOT DETECTED   Staphylococcus lugdunensis NOT DETECTED NOT DETECTED   Streptococcus species NOT DETECTED NOT DETECTED   Streptococcus agalactiae NOT DETECTED NOT DETECTED   Streptococcus pneumoniae NOT DETECTED NOT DETECTED   Streptococcus pyogenes NOT DETECTED NOT DETECTED   A.calcoaceticus-baumannii NOT DETECTED NOT DETECTED   Bacteroides fragilis NOT DETECTED NOT DETECTED   Enterobacterales NOT DETECTED NOT DETECTED   Enterobacter cloacae complex NOT DETECTED NOT DETECTED   Escherichia coli NOT DETECTED NOT DETECTED   Klebsiella aerogenes NOT DETECTED NOT DETECTED   Klebsiella oxytoca NOT DETECTED NOT DETECTED   Klebsiella pneumoniae NOT DETECTED NOT DETECTED   Proteus species NOT DETECTED NOT DETECTED   Salmonella species NOT DETECTED NOT  DETECTED   Serratia marcescens NOT DETECTED NOT DETECTED   Haemophilus influenzae NOT DETECTED NOT DETECTED   Neisseria meningitidis NOT DETECTED NOT DETECTED   Pseudomonas aeruginosa NOT DETECTED NOT DETECTED   Stenotrophomonas maltophilia NOT DETECTED NOT DETECTED   Candida albicans NOT DETECTED NOT DETECTED   Candida auris NOT DETECTED NOT DETECTED   Candida glabrata NOT DETECTED NOT DETECTED   Candida krusei NOT DETECTED NOT DETECTED   Candida parapsilosis NOT DETECTED NOT DETECTED   Candida tropicalis NOT DETECTED NOT DETECTED   Cryptococcus neoformans/gattii NOT DETECTED NOT DETECTED   Methicillin resistance mecA/C DETECTED (A) NOT DETECTED    Netta Cedars PharmD 01/07/2020  1:26 AM

## 2020-01-08 DIAGNOSIS — G301 Alzheimer's disease with late onset: Secondary | ICD-10-CM

## 2020-01-08 DIAGNOSIS — N390 Urinary tract infection, site not specified: Secondary | ICD-10-CM

## 2020-01-08 DIAGNOSIS — F0280 Dementia in other diseases classified elsewhere without behavioral disturbance: Secondary | ICD-10-CM

## 2020-01-08 DIAGNOSIS — E1159 Type 2 diabetes mellitus with other circulatory complications: Secondary | ICD-10-CM

## 2020-01-08 DIAGNOSIS — E1169 Type 2 diabetes mellitus with other specified complication: Secondary | ICD-10-CM

## 2020-01-08 DIAGNOSIS — E785 Hyperlipidemia, unspecified: Secondary | ICD-10-CM

## 2020-01-08 DIAGNOSIS — I152 Hypertension secondary to endocrine disorders: Secondary | ICD-10-CM

## 2020-01-08 DIAGNOSIS — E034 Atrophy of thyroid (acquired): Secondary | ICD-10-CM

## 2020-01-08 LAB — COMPREHENSIVE METABOLIC PANEL
ALT: 516 U/L — ABNORMAL HIGH (ref 0–44)
AST: 446 U/L — ABNORMAL HIGH (ref 15–41)
Albumin: 2 g/dL — ABNORMAL LOW (ref 3.5–5.0)
Alkaline Phosphatase: 78 U/L (ref 38–126)
Anion gap: 11 (ref 5–15)
BUN: 24 mg/dL — ABNORMAL HIGH (ref 8–23)
CO2: 20 mmol/L — ABNORMAL LOW (ref 22–32)
Calcium: 7.8 mg/dL — ABNORMAL LOW (ref 8.9–10.3)
Chloride: 104 mmol/L (ref 98–111)
Creatinine, Ser: 1.17 mg/dL — ABNORMAL HIGH (ref 0.44–1.00)
GFR, Estimated: 47 mL/min — ABNORMAL LOW (ref >60)
Glucose, Bld: 108 mg/dL — ABNORMAL HIGH (ref 70–99)
Potassium: 3.5 mmol/L (ref 3.5–5.1)
Sodium: 135 mmol/L (ref 135–145)
Total Bilirubin: 0.5 mg/dL (ref 0.3–1.2)
Total Protein: 5.7 g/dL — ABNORMAL LOW (ref 6.5–8.1)

## 2020-01-08 LAB — CBC
HCT: 26.9 % — ABNORMAL LOW (ref 36.0–46.0)
Hemoglobin: 8.2 g/dL — ABNORMAL LOW (ref 12.0–15.0)
MCH: 28.9 pg (ref 26.0–34.0)
MCHC: 30.5 g/dL (ref 30.0–36.0)
MCV: 94.7 fL (ref 80.0–100.0)
Platelets: 446 10*3/uL — ABNORMAL HIGH (ref 150–400)
RBC: 2.84 MIL/uL — ABNORMAL LOW (ref 3.87–5.11)
RDW: 14.7 % (ref 11.5–15.5)
WBC: 13.5 10*3/uL — ABNORMAL HIGH (ref 4.0–10.5)
nRBC: 0 % (ref 0.0–0.2)

## 2020-01-08 LAB — MAGNESIUM: Magnesium: 1.8 mg/dL (ref 1.7–2.4)

## 2020-01-08 LAB — GLUCOSE, CAPILLARY
Glucose-Capillary: 103 mg/dL — ABNORMAL HIGH (ref 70–99)
Glucose-Capillary: 129 mg/dL — ABNORMAL HIGH (ref 70–99)
Glucose-Capillary: 99 mg/dL (ref 70–99)
Glucose-Capillary: 83 mg/dL (ref 70–99)

## 2020-01-08 LAB — PHOSPHORUS: Phosphorus: 2.2 mg/dL — ABNORMAL LOW (ref 2.5–4.6)

## 2020-01-08 LAB — LACTIC ACID, PLASMA: Lactic Acid, Venous: 1 mmol/L (ref 0.5–1.9)

## 2020-01-08 MED ORDER — JUVEN PO PACK
1.0000 | PACK | Freq: Two times a day (BID) | ORAL | Status: DC
  Administered 2020-01-08: 1 via ORAL
  Filled 2020-01-08: qty 1

## 2020-01-08 MED ORDER — MORPHINE SULFATE (PF) 2 MG/ML IV SOLN
2.0000 mg | INTRAVENOUS | Status: DC | PRN
  Administered 2020-01-08 – 2020-01-09 (×4): 2 mg via INTRAVENOUS
  Filled 2020-01-08 (×4): qty 1

## 2020-01-08 MED ORDER — ENSURE ENLIVE PO LIQD
237.0000 mL | Freq: Two times a day (BID) | ORAL | Status: DC
  Administered 2020-01-09 (×2): 237 mL via ORAL

## 2020-01-08 MED ORDER — VANCOMYCIN HCL 750 MG/150ML IV SOLN
750.0000 mg | INTRAVENOUS | Status: DC
  Filled 2020-01-08: qty 150

## 2020-01-08 NOTE — Progress Notes (Signed)
Pharmacy Antibiotic Note  Ana Newman is a 84 y.o. female admitted on 01/06/2020 with sepsis, sacral wounds.  Pharmacy has been consulted for vancomycin and zosyn  dosing.  Day #3 abx - Tm 98.6, afebrile now - WBC 13.5 - SCr 1.17, improved - Lactate 1.0  Plan:  Continue Zosyn 3.375gm IV q8h (4hr extended infusions) for CrCl > 20 ml/min   Increase to Vancomycin 750 mg IV q24h  Daily SCr while on vancomycin and Zosyn   Monitor clinical course, renal function, cultures as available   Height: 5\' 4"  (162.6 cm) Weight: 63 kg (138 lb 14.2 oz) IBW/kg (Calculated) : 54.7  Temp (24hrs), Avg:98.3 F (36.8 C), Min:97.8 F (36.6 C), Max:98.6 F (37 C)  Recent Labs  Lab 01/06/20 0427 01/06/20 0550 01/07/20 0420 01/07/20 2225 01/08/20 0239  WBC 14.5*  --   --  13.6* 13.5*  CREATININE 1.95*  --  1.39* 1.13* 1.17*  LATICACIDVEN 2.0* 1.6  --   --  1.0    Estimated Creatinine Clearance: 32 mL/min (A) (by C-G formula based on SCr of 1.17 mg/dL (H)).    Allergies  Allergen Reactions  . 5-Alpha Reductase Inhibitors Other (See Comments)    Unknown reaction  . Chocolate Other (See Comments)    Unknown reaction  . Pea Other (See Comments)    Unknown reaction    Antimicrobials this admission: 12/5 vancomycin >>  12/5 Zosyn >>   Dose adjustments this admission: 12/6 Zosyn renally adjusted 12/7 Vancomycin renally adjusted  Microbiology results: 12/5 BCx: 1/2 GPCC (BCID+ S. Epi- MecA detected) 12/5 Salmonella: neg 12/5 UCx: mult species 12/5 COVID/Flu: neg/neg 12/6 MRSA PCR: negative  Thank you for allowing pharmacy to be a part of this patient's care.  Gretta Arab PharmD, BCPS Clinical Pharmacist [facility] main pharmacy [phone] 01/08/2020 10:58 AM
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
## 2020-01-09 DIAGNOSIS — Z7189 Other specified counseling: Secondary | ICD-10-CM

## 2020-01-09 DIAGNOSIS — Z515 Encounter for palliative care: Secondary | ICD-10-CM

## 2020-01-09 DIAGNOSIS — R627 Adult failure to thrive: Secondary | ICD-10-CM

## 2020-01-09 DIAGNOSIS — L89159 Pressure ulcer of sacral region, unspecified stage: Secondary | ICD-10-CM

## 2020-01-09 LAB — CBC
HCT: 27.3 % — ABNORMAL LOW (ref 36.0–46.0)
Hemoglobin: 8.4 g/dL — ABNORMAL LOW (ref 12.0–15.0)
MCH: 29.3 pg (ref 26.0–34.0)
MCHC: 30.8 g/dL (ref 30.0–36.0)
MCV: 95.1 fL (ref 80.0–100.0)
Platelets: 371 10*3/uL (ref 150–400)
RBC: 2.87 MIL/uL — ABNORMAL LOW (ref 3.87–5.11)
RDW: 14.8 % (ref 11.5–15.5)
WBC: 9.9 10*3/uL (ref 4.0–10.5)
nRBC: 0 % (ref 0.0–0.2)

## 2020-01-09 LAB — COMPREHENSIVE METABOLIC PANEL
ALT: 353 U/L — ABNORMAL HIGH (ref 0–44)
AST: 172 U/L — ABNORMAL HIGH (ref 15–41)
Albumin: 2 g/dL — ABNORMAL LOW (ref 3.5–5.0)
Alkaline Phosphatase: 72 U/L (ref 38–126)
Anion gap: 9 (ref 5–15)
BUN: 25 mg/dL — ABNORMAL HIGH (ref 8–23)
CO2: 20 mmol/L — ABNORMAL LOW (ref 22–32)
Calcium: 7.7 mg/dL — ABNORMAL LOW (ref 8.9–10.3)
Chloride: 106 mmol/L (ref 98–111)
Creatinine, Ser: 1.09 mg/dL — ABNORMAL HIGH (ref 0.44–1.00)
GFR, Estimated: 51 mL/min — ABNORMAL LOW (ref >60)
Glucose, Bld: 172 mg/dL — ABNORMAL HIGH (ref 70–99)
Potassium: 3.3 mmol/L — ABNORMAL LOW (ref 3.5–5.1)
Sodium: 135 mmol/L (ref 135–145)
Total Bilirubin: 0.5 mg/dL (ref 0.3–1.2)
Total Protein: 5.5 g/dL — ABNORMAL LOW (ref 6.5–8.1)

## 2020-01-09 LAB — MAGNESIUM: Magnesium: 1.8 mg/dL (ref 1.7–2.4)

## 2020-01-09 LAB — GLUCOSE, CAPILLARY: Glucose-Capillary: 142 mg/dL — ABNORMAL HIGH (ref 70–99)

## 2020-01-09 LAB — PHOSPHORUS: Phosphorus: 2.2 mg/dL — ABNORMAL LOW (ref 2.5–4.6)

## 2020-01-09 MED ORDER — MORPHINE SULFATE (PF) 2 MG/ML IV SOLN
2.0000 mg | INTRAVENOUS | Status: DC | PRN
  Administered 2020-01-09: 2 mg via INTRAVENOUS
  Filled 2020-01-09: qty 1

## 2020-01-09 MED ORDER — POLYVINYL ALCOHOL 1.4 % OP SOLN
1.0000 [drp] | Freq: Four times a day (QID) | OPHTHALMIC | Status: DC | PRN
  Filled 2020-01-09: qty 15

## 2020-01-09 MED ORDER — POTASSIUM CHLORIDE CRYS ER 20 MEQ PO TBCR: 20.0000 meq | EXTENDED_RELEASE_TABLET | Freq: Two times a day (BID) | ORAL | Status: DC

## 2020-01-09 MED ORDER — GLYCOPYRROLATE 1 MG PO TABS: 1.0000 mg | ORAL_TABLET | ORAL | Status: DC | PRN

## 2020-01-09 MED ORDER — GLYCOPYRROLATE 0.2 MG/ML IJ SOLN: 0.2000 mg | INTRAMUSCULAR | Status: DC | PRN

## 2020-01-09 MED ORDER — BIOTENE DRY MOUTH MT LIQD: 15.0000 mL | OROMUCOSAL | Status: DC | PRN

## 2020-01-09 MED ORDER — K PHOS MONO-SOD PHOS DI & MONO 155-852-130 MG PO TABS
250.0000 mg | ORAL_TABLET | Freq: Two times a day (BID) | ORAL | Status: DC
  Filled 2020-01-09: qty 1
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
## 2020-01-09 NOTE — TOC Progression Note (Signed)
Transition of Care [facility]) - Progression Note    Patient Details  Name: Ana Newman MRN: [id number] Date of Birth: 1935/11/14  Transition of Care [facility]) CM/SW Contact  Leeroy Cha, RN Phone Number: 01/09/2020, 10:45 AM  Clinical Narrative:    1045/tct-[facility] for [facility] information left on voice mail . Daughter's phone number left and also mine.        Expected Discharge Plan and Services                                                 Social Determinants of Health (SDOH) Interventions    Readmission Risk Interventions No flowsheet data found.

## 2020-01-09 NOTE — Progress Notes (Signed)
Manufacturing engineer [facility]) Hospital Liaison note.     Received request from [place] for family interest in [facility]. [facility] is unable to offer a room today. Hospital Liaison will follow up tomorrow or sooner if a room becomes available and eligibility is confirmed.    Thank you,    Farrel Gordon, RN, [facility]       [place] (listed on AMION under [place])     [phone]

## 2020-01-09 NOTE — Progress Notes (Signed)
Palliative:  Full note to follow. I have spoken with daughter, [name], and Ms. Pablo (although she is very sleepy and unsure how much she understands). Christie requests full comfort care and hopeful for placement at [facility]. Will d/c antibiotics and aggressive wound care per family request and for comfort. Prognosis poor and anticipated < 2 weeks given shift to full comfort.   Vinie Sill, NP Palliative Medicine Team Pager [phone] (Please see [email or web address] for schedule) Team Phone [phone]
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
## 2020-01-10 MED ORDER — MORPHINE SULFATE (CONCENTRATE) 10 MG/0.5ML PO SOLN
5.0000 mg | Freq: Three times a day (TID) | ORAL | Status: DC
  Administered 2020-01-10 – 2020-01-11 (×3): 5 mg via SUBLINGUAL
  Filled 2020-01-10 (×3): qty 0.5

## 2020-01-10 MED ORDER — OXYCODONE HCL 5 MG PO TABS
5.0000 mg | ORAL_TABLET | ORAL | Status: DC | PRN
  Administered 2020-01-10: 5 mg via ORAL
  Filled 2020-01-10: qty 1

## 2020-01-10 MED ORDER — GABAPENTIN 300 MG PO CAPS
300.0000 mg | ORAL_CAPSULE | Freq: Three times a day (TID) | ORAL | Status: DC
  Administered 2020-01-10 – 2020-01-11 (×4): 300 mg via ORAL
  Filled 2020-01-10 (×4): qty 1

## 2020-01-10 NOTE — Progress Notes (Signed)
Manufacturing engineer [facility]) Hospital Liaison Note  Unfortunately [facility] is not able to offer a room today.   Family and CSW are aware Manufacturing engineer liaison will follow up with CSW and family tomorrow or sooner if room becomes available.   Please do not hesitate to call with questions. Thank you.   Gar Ponto, RN [facility] Liaison  [phone]
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
## 2020-01-11 DIAGNOSIS — R6521 Severe sepsis with septic shock: Secondary | ICD-10-CM

## 2020-01-11 DIAGNOSIS — A419 Sepsis, unspecified organism: Secondary | ICD-10-CM

## 2020-01-11 DIAGNOSIS — N179 Acute kidney failure, unspecified: Secondary | ICD-10-CM

## 2020-01-11 DIAGNOSIS — Z515 Encounter for palliative care: Secondary | ICD-10-CM

## 2020-01-11 MED ORDER — ACETAMINOPHEN 325 MG PO TABS: 650.0000 mg | ORAL_TABLET | Freq: Four times a day (QID) | ORAL | Status: AC | PRN

## 2020-01-11 MED ORDER — MORPHINE SULFATE (CONCENTRATE) 10 MG/0.5ML PO SOLN: 5.0000 mg | Freq: Three times a day (TID) | ORAL | 0 refills | Status: AC

## 2020-01-11 MED ORDER — GABAPENTIN 300 MG PO CAPS: 300.0000 mg | ORAL_CAPSULE | Freq: Three times a day (TID) | ORAL | Status: AC

## 2020-01-11 MED ORDER — PANTOPRAZOLE SODIUM 40 MG PO TBEC: 40.0000 mg | DELAYED_RELEASE_TABLET | Freq: Every day | ORAL | Status: AC

## 2020-01-11 MED ORDER — OXYCODONE HCL 5 MG PO TABS
5.0000 mg | ORAL_TABLET | ORAL | 0 refills | Status: AC | PRN
Start: 2020-01-11 — End: ?

## 2020-01-11 MED ORDER — GLYCOPYRROLATE 1 MG PO TABS: 1.0000 mg | ORAL_TABLET | ORAL | Status: AC | PRN

## 2020-01-11 MED ORDER — ENSURE ENLIVE PO LIQD: 237.0000 mL | Freq: Two times a day (BID) | ORAL | 12 refills | Status: AC

## 2020-01-11 MED ORDER — POLYETHYLENE GLYCOL 3350 17 G PO PACK: 17.0000 g | PACK | Freq: Every day | ORAL | 0 refills | Status: AC | PRN

## 2020-01-11 MED ORDER — POLYVINYL ALCOHOL 1.4 % OP SOLN: 1.0000 [drp] | Freq: Four times a day (QID) | OPHTHALMIC | 0 refills | Status: AC | PRN

## 2020-01-11 NOTE — Progress Notes (Addendum)
Manufacturing engineer [facility])  [facility] has a bed for Ms. Asch today.  Spoke with dtr [name] to confirm that the family still would like to pursue [facility], they are in agreement to transfer today.   Necessary consents have to be completed and then transport can occur.  This Probation officer will update [facility] once consents are completed and transport can be arranged.  RN staff, you may call report to [phone] at any time.  Room will be assigned when report is called.  Please call [name] once [facility] arrives so she has an idea what time to meet her mother at [facility].   Please fax d/c summary to [phone] once completed.  Venia Carbon RN, BSN, [facility] Liaison

## 2020-01-11 NOTE — Progress Notes (Signed)
Palliative:  Noted that [facility] has bed available for Ms. Coxe today. She is resting comfortably and I did not awaken. No family at bedside.   Request RN to provide extra pain medication prior to transfer to ensure comfort on ride to hospice.   No charge  Vinie Sill, NP Palliative Medicine Team Pager [phone] (Please see [email or web address] for schedule) Team Phone [phone]

## 2020-01-11 NOTE — Progress Notes (Addendum)
Pt discharged to [facility] place. Left unit on stretcher pushed by ambulance staff. Pt medicated for pain prior to transfer. Daughter, [name], called and made aware of pt's transfer. Daughter expressed thanks for the call.

## 2020-01-11 NOTE — Care Management Important Message (Signed)
Important Message  Patient Details IM Letter given to the Patient. Name: Ana Newman MRN: [id number] Date of Birth: October 08, 1935   Medicare Important Message Given:  Yes     Kerin Salen 01/11/2020, 10:23 AM

## 2020-01-11 NOTE — Discharge Summary (Signed)
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 01/07/2020 T. TYSOR    Staphylococcus aureus (BCID) NOT DETECTED NOT DETECTED Final   Staphylococcus epidermidis DETECTED (A) NOT DETECTED Final    Comment: Methicillin (oxacillin) resistant coagulase negative staphylococcus. Possible blood culture contaminant (unless isolated from more than one blood culture draw or clinical case suggests pathogenicity). No antibiotic treatment is indicated for blood  culture  contaminants. CRITICAL RESULT CALLED TO, READ BACK BY AND VERIFIED WITH: M. LILLISTON,PHARMD 0121 01/07/2020 T. TYSOR    Staphylococcus lugdunensis NOT DETECTED NOT DETECTED Final   Streptococcus species NOT DETECTED NOT DETECTED Final   Streptococcus agalactiae NOT DETECTED NOT DETECTED Final   Streptococcus pneumoniae NOT DETECTED NOT DETECTED Final   Streptococcus pyogenes NOT DETECTED NOT DETECTED Final   A.calcoaceticus-baumannii NOT DETECTED NOT DETECTED Final   Bacteroides fragilis NOT DETECTED NOT DETECTED Final   Enterobacterales NOT DETECTED NOT DETECTED Final   Enterobacter cloacae complex NOT DETECTED NOT DETECTED Final   Escherichia coli NOT DETECTED NOT DETECTED Final   Klebsiella aerogenes NOT DETECTED NOT DETECTED Final   Klebsiella oxytoca NOT DETECTED NOT DETECTED Final   Klebsiella pneumoniae NOT DETECTED NOT DETECTED Final   Proteus species NOT DETECTED NOT DETECTED Final   Salmonella species NOT DETECTED NOT DETECTED Final   Serratia marcescens NOT DETECTED NOT DETECTED Final   Haemophilus influenzae NOT DETECTED NOT DETECTED Final   Neisseria meningitidis NOT DETECTED NOT DETECTED Final   Pseudomonas aeruginosa NOT DETECTED NOT DETECTED Final   Stenotrophomonas maltophilia NOT DETECTED NOT DETECTED Final   Candida albicans NOT DETECTED NOT DETECTED Final   Candida auris NOT DETECTED NOT DETECTED Final   Candida glabrata NOT DETECTED NOT DETECTED Final   Candida krusei NOT DETECTED NOT DETECTED Final   Candida parapsilosis NOT DETECTED NOT DETECTED Final   Candida tropicalis NOT DETECTED NOT DETECTED Final   Cryptococcus neoformans/gattii NOT DETECTED NOT DETECTED Final   Methicillin resistance mecA/C DETECTED (A) NOT DETECTED Final    Comment: CRITICAL RESULT CALLED TO, READ BACK BY AND VERIFIED WITH: MUbaldo Glassing 0121 01/07/2020 Mena Goes Performed at [facility], [address], [place], [place] [address]   Resp Panel by RT-PCR (Flu A&B, Covid)  Nasopharyngeal Swab     Status: None   Collection Time: 01/06/20  4:57 AM   Specimen: Nasopharyngeal Swab; Nasopharyngeal(NP) swabs in vial transport medium  Result Value Ref Range Status   SARS Coronavirus 2 by RT PCR NEGATIVE NEGATIVE Final    Comment: (NOTE) SARS-CoV-2 target nucleic acids are NOT DETECTED.  The SARS-CoV-2 RNA is generally detectable in upper respiratory specimens during the acute phase of infection. The lowest concentration of SARS-CoV-2 viral copies this assay can detect is 138 copies/mL. A negative result does not preclude SARS-Cov-2 infection and should not be used as the sole basis for treatment or other patient management decisions. A negative result may occur with  improper specimen collection/handling, submission of specimen other than nasopharyngeal swab, presence of viral mutation(s) within the areas targeted by this assay, and inadequate number of viral copies(<138 copies/mL). A negative result must be combined with clinical observations, patient history, and epidemiological information. The expected result is Negative.  Fact Sheet for Patients:  EntrepreneurPulse.com.au  Fact Sheet for Healthcare Providers:  IncredibleEmployment.be  This test is no t yet approved or cleared by the [place]  States FDA and  has been authorized for detection and/or diagnosis of SARS-CoV-2 by FDA under an Emergency Use Authorization (EUA). This EUA will remain  in effect (meaning this test can be used) for the duration of the COVID-19 declaration under Section 564(b)(1) of the Act, 21 U.S.C.section 360bbb-3(b)(1), unless the authorization is terminated  or revoked sooner.       Influenza A by PCR NEGATIVE NEGATIVE Final   Influenza B by PCR NEGATIVE NEGATIVE Final    Comment: (NOTE) The Xpert Xpress SARS-CoV-2/FLU/RSV plus assay is intended as an aid in the diagnosis of influenza from Nasopharyngeal swab specimens and should not be  used as a sole basis for treatment. Nasal washings and aspirates are unacceptable for Xpert Xpress SARS-CoV-2/FLU/RSV testing.  Fact Sheet for Patients: EntrepreneurPulse.com.au  Fact Sheet for Healthcare Providers: IncredibleEmployment.be  This test is not yet approved or cleared by the [place] FDA and has been authorized for detection and/or diagnosis of SARS-CoV-2 by FDA under an Emergency Use Authorization (EUA). This EUA will remain in effect (meaning this test can be used) for the duration of the COVID-19 declaration under Section 564(b)(1) of the Act, 21 U.S.C. section 360bbb-3(b)(1), unless the authorization is terminated or revoked.  Performed at [facility], [place] [address], [place], [place] [address]   MRSA PCR Screening     Status: None   Collection Time: 01/07/20 12:18 PM   Specimen: Nasopharyngeal  Result Value Ref Range Status   MRSA by PCR NEGATIVE NEGATIVE Final    Comment:        The GeneXpert MRSA Assay (FDA approved for NASAL specimens only), is one component of a comprehensive MRSA colonization surveillance program. It is not intended to diagnose MRSA infection nor to guide or monitor treatment for MRSA infections. Performed at [facility], [place] [address], [place], [place] [address]      Time coordinating discharge: 35 minutes  SIGNED:   Cordelia Poche, MD [facility] 01/11/2020, 1:53 PM
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
## 2020-01-11 NOTE — TOC Transition Note (Signed)
Transition of Care [facility]) - CM/SW Discharge Note   Patient Details  Name: Ana Newman MRN: [id number] Date of Birth: Sep 02, 1935  [facility] CM/SW Contact:  Trish Mage, LCSW Phone Number: 01/11/2020, 2:01 PM   Clinical Narrative:   Patient cleared for transfer to [facility] today.  Family alerted. PTAR arranged.  Nursing, please see Venia Carbon note for report call information. TOC sign off.     Final next level of care: [place] Barriers to Discharge: No Barriers Identified   Patient Goals and CMS Choice        Discharge Placement                       Discharge Plan and Services                                     Social Determinants of Health (SDOH) Interventions     Readmission Risk Interventions No flowsheet data found.

## 2020-01-11 NOTE — Progress Notes (Signed)
Report called to [facility].

## 2020-01-12 DIAGNOSIS — Z7401 Bed confinement status: Secondary | ICD-10-CM | POA: Diagnosis not present

## 2020-01-12 DIAGNOSIS — G309 Alzheimer's disease, unspecified: Secondary | ICD-10-CM | POA: Diagnosis not present

## 2020-01-12 DIAGNOSIS — A419 Sepsis, unspecified organism: Secondary | ICD-10-CM | POA: Diagnosis not present

## 2020-01-12 DIAGNOSIS — I10 Essential (primary) hypertension: Secondary | ICD-10-CM | POA: Diagnosis not present

## 2020-01-12 DIAGNOSIS — R32 Unspecified urinary incontinence: Secondary | ICD-10-CM | POA: Diagnosis not present

## 2020-01-12 DIAGNOSIS — L89154 Pressure ulcer of sacral region, stage 4: Secondary | ICD-10-CM | POA: Diagnosis not present

## 2020-01-12 DIAGNOSIS — Z6823 Body mass index (BMI) 23.0-23.9, adult: Secondary | ICD-10-CM | POA: Diagnosis not present

## 2020-01-12 DIAGNOSIS — E119 Type 2 diabetes mellitus without complications: Secondary | ICD-10-CM | POA: Diagnosis not present

## 2020-01-12 DIAGNOSIS — N200 Calculus of kidney: Secondary | ICD-10-CM | POA: Diagnosis not present

## 2020-01-12 DIAGNOSIS — I82409 Acute embolism and thrombosis of unspecified deep veins of unspecified lower extremity: Secondary | ICD-10-CM | POA: Diagnosis not present

## 2020-01-12 DIAGNOSIS — E039 Hypothyroidism, unspecified: Secondary | ICD-10-CM | POA: Diagnosis not present

## 2020-01-12 DIAGNOSIS — F0280 Dementia in other diseases classified elsewhere without behavioral disturbance: Secondary | ICD-10-CM | POA: Diagnosis not present

## 2020-01-12 DIAGNOSIS — N390 Urinary tract infection, site not specified: Secondary | ICD-10-CM | POA: Diagnosis not present

## 2020-01-12 DIAGNOSIS — E785 Hyperlipidemia, unspecified: Secondary | ICD-10-CM | POA: Diagnosis not present

## 2020-01-13 DIAGNOSIS — A419 Sepsis, unspecified organism: Secondary | ICD-10-CM | POA: Diagnosis not present

## 2020-01-13 DIAGNOSIS — G309 Alzheimer's disease, unspecified: Secondary | ICD-10-CM | POA: Diagnosis not present

## 2020-01-13 DIAGNOSIS — N200 Calculus of kidney: Secondary | ICD-10-CM | POA: Diagnosis not present

## 2020-01-13 DIAGNOSIS — F0280 Dementia in other diseases classified elsewhere without behavioral disturbance: Secondary | ICD-10-CM | POA: Diagnosis not present

## 2020-01-13 DIAGNOSIS — N390 Urinary tract infection, site not specified: Secondary | ICD-10-CM | POA: Diagnosis not present

## 2020-01-13 DIAGNOSIS — L89154 Pressure ulcer of sacral region, stage 4: Secondary | ICD-10-CM | POA: Diagnosis not present

## 2020-01-14 DIAGNOSIS — F0280 Dementia in other diseases classified elsewhere without behavioral disturbance: Secondary | ICD-10-CM | POA: Diagnosis not present

## 2020-01-14 DIAGNOSIS — A419 Sepsis, unspecified organism: Secondary | ICD-10-CM | POA: Diagnosis not present

## 2020-01-14 DIAGNOSIS — N200 Calculus of kidney: Secondary | ICD-10-CM | POA: Diagnosis not present

## 2020-01-14 DIAGNOSIS — N390 Urinary tract infection, site not specified: Secondary | ICD-10-CM | POA: Diagnosis not present

## 2020-01-14 DIAGNOSIS — G309 Alzheimer's disease, unspecified: Secondary | ICD-10-CM | POA: Diagnosis not present

## 2020-01-14 DIAGNOSIS — L89154 Pressure ulcer of sacral region, stage 4: Secondary | ICD-10-CM | POA: Diagnosis not present

## 2020-01-15 DIAGNOSIS — F0280 Dementia in other diseases classified elsewhere without behavioral disturbance: Secondary | ICD-10-CM | POA: Diagnosis not present

## 2020-01-15 DIAGNOSIS — N390 Urinary tract infection, site not specified: Secondary | ICD-10-CM | POA: Diagnosis not present

## 2020-01-15 DIAGNOSIS — A419 Sepsis, unspecified organism: Secondary | ICD-10-CM | POA: Diagnosis not present

## 2020-01-15 DIAGNOSIS — L89154 Pressure ulcer of sacral region, stage 4: Secondary | ICD-10-CM | POA: Diagnosis not present

## 2020-01-15 DIAGNOSIS — G309 Alzheimer's disease, unspecified: Secondary | ICD-10-CM | POA: Diagnosis not present

## 2020-01-15 DIAGNOSIS — N200 Calculus of kidney: Secondary | ICD-10-CM | POA: Diagnosis not present

## 2020-01-16 DIAGNOSIS — G309 Alzheimer's disease, unspecified: Secondary | ICD-10-CM | POA: Diagnosis not present

## 2020-01-16 DIAGNOSIS — N200 Calculus of kidney: Secondary | ICD-10-CM | POA: Diagnosis not present

## 2020-01-16 DIAGNOSIS — F0280 Dementia in other diseases classified elsewhere without behavioral disturbance: Secondary | ICD-10-CM | POA: Diagnosis not present

## 2020-01-16 DIAGNOSIS — L89154 Pressure ulcer of sacral region, stage 4: Secondary | ICD-10-CM | POA: Diagnosis not present

## 2020-01-16 DIAGNOSIS — A419 Sepsis, unspecified organism: Secondary | ICD-10-CM | POA: Diagnosis not present

## 2020-01-16 DIAGNOSIS — N390 Urinary tract infection, site not specified: Secondary | ICD-10-CM | POA: Diagnosis not present

## 2020-01-17 DIAGNOSIS — N200 Calculus of kidney: Secondary | ICD-10-CM | POA: Diagnosis not present

## 2020-01-17 DIAGNOSIS — L89154 Pressure ulcer of sacral region, stage 4: Secondary | ICD-10-CM | POA: Diagnosis not present

## 2020-01-17 DIAGNOSIS — G309 Alzheimer's disease, unspecified: Secondary | ICD-10-CM | POA: Diagnosis not present

## 2020-01-17 DIAGNOSIS — N390 Urinary tract infection, site not specified: Secondary | ICD-10-CM | POA: Diagnosis not present

## 2020-01-17 DIAGNOSIS — F0280 Dementia in other diseases classified elsewhere without behavioral disturbance: Secondary | ICD-10-CM | POA: Diagnosis not present

## 2020-01-17 DIAGNOSIS — A419 Sepsis, unspecified organism: Secondary | ICD-10-CM | POA: Diagnosis not present

## 2020-01-18 DIAGNOSIS — L89154 Pressure ulcer of sacral region, stage 4: Secondary | ICD-10-CM | POA: Diagnosis not present

## 2020-01-18 DIAGNOSIS — N390 Urinary tract infection, site not specified: Secondary | ICD-10-CM | POA: Diagnosis not present

## 2020-01-18 DIAGNOSIS — N200 Calculus of kidney: Secondary | ICD-10-CM | POA: Diagnosis not present

## 2020-01-18 DIAGNOSIS — A419 Sepsis, unspecified organism: Secondary | ICD-10-CM | POA: Diagnosis not present

## 2020-01-18 DIAGNOSIS — F0280 Dementia in other diseases classified elsewhere without behavioral disturbance: Secondary | ICD-10-CM | POA: Diagnosis not present

## 2020-01-18 DIAGNOSIS — G309 Alzheimer's disease, unspecified: Secondary | ICD-10-CM | POA: Diagnosis not present

## 2020-01-19 DIAGNOSIS — N200 Calculus of kidney: Secondary | ICD-10-CM | POA: Diagnosis not present

## 2020-01-19 DIAGNOSIS — L89154 Pressure ulcer of sacral region, stage 4: Secondary | ICD-10-CM | POA: Diagnosis not present

## 2020-01-19 DIAGNOSIS — F0280 Dementia in other diseases classified elsewhere without behavioral disturbance: Secondary | ICD-10-CM | POA: Diagnosis not present

## 2020-01-19 DIAGNOSIS — A419 Sepsis, unspecified organism: Secondary | ICD-10-CM | POA: Diagnosis not present

## 2020-01-19 DIAGNOSIS — N390 Urinary tract infection, site not specified: Secondary | ICD-10-CM | POA: Diagnosis not present

## 2020-01-19 DIAGNOSIS — G309 Alzheimer's disease, unspecified: Secondary | ICD-10-CM | POA: Diagnosis not present

## 2020-01-20 DIAGNOSIS — N390 Urinary tract infection, site not specified: Secondary | ICD-10-CM | POA: Diagnosis not present

## 2020-01-20 DIAGNOSIS — N200 Calculus of kidney: Secondary | ICD-10-CM | POA: Diagnosis not present

## 2020-01-20 DIAGNOSIS — F0280 Dementia in other diseases classified elsewhere without behavioral disturbance: Secondary | ICD-10-CM | POA: Diagnosis not present

## 2020-01-20 DIAGNOSIS — A419 Sepsis, unspecified organism: Secondary | ICD-10-CM | POA: Diagnosis not present

## 2020-01-20 DIAGNOSIS — L89154 Pressure ulcer of sacral region, stage 4: Secondary | ICD-10-CM | POA: Diagnosis not present

## 2020-01-20 DIAGNOSIS — G309 Alzheimer's disease, unspecified: Secondary | ICD-10-CM | POA: Diagnosis not present

## 2020-01-21 DIAGNOSIS — F0280 Dementia in other diseases classified elsewhere without behavioral disturbance: Secondary | ICD-10-CM | POA: Diagnosis not present

## 2020-01-21 DIAGNOSIS — G309 Alzheimer's disease, unspecified: Secondary | ICD-10-CM | POA: Diagnosis not present

## 2020-01-21 DIAGNOSIS — L89154 Pressure ulcer of sacral region, stage 4: Secondary | ICD-10-CM | POA: Diagnosis not present

## 2020-01-21 DIAGNOSIS — N200 Calculus of kidney: Secondary | ICD-10-CM | POA: Diagnosis not present

## 2020-01-21 DIAGNOSIS — N390 Urinary tract infection, site not specified: Secondary | ICD-10-CM | POA: Diagnosis not present

## 2020-01-21 DIAGNOSIS — A419 Sepsis, unspecified organism: Secondary | ICD-10-CM | POA: Diagnosis not present

## 2020-01-22 DIAGNOSIS — F0280 Dementia in other diseases classified elsewhere without behavioral disturbance: Secondary | ICD-10-CM | POA: Diagnosis not present

## 2020-01-22 DIAGNOSIS — N200 Calculus of kidney: Secondary | ICD-10-CM | POA: Diagnosis not present

## 2020-01-22 DIAGNOSIS — L89154 Pressure ulcer of sacral region, stage 4: Secondary | ICD-10-CM | POA: Diagnosis not present

## 2020-01-22 DIAGNOSIS — A419 Sepsis, unspecified organism: Secondary | ICD-10-CM | POA: Diagnosis not present

## 2020-01-22 DIAGNOSIS — N390 Urinary tract infection, site not specified: Secondary | ICD-10-CM | POA: Diagnosis not present

## 2020-01-22 DIAGNOSIS — G309 Alzheimer's disease, unspecified: Secondary | ICD-10-CM | POA: Diagnosis not present

## 2020-01-23 DIAGNOSIS — L89154 Pressure ulcer of sacral region, stage 4: Secondary | ICD-10-CM | POA: Diagnosis not present

## 2020-01-23 DIAGNOSIS — G309 Alzheimer's disease, unspecified: Secondary | ICD-10-CM | POA: Diagnosis not present

## 2020-01-23 DIAGNOSIS — N200 Calculus of kidney: Secondary | ICD-10-CM | POA: Diagnosis not present

## 2020-01-23 DIAGNOSIS — F0280 Dementia in other diseases classified elsewhere without behavioral disturbance: Secondary | ICD-10-CM | POA: Diagnosis not present

## 2020-01-23 DIAGNOSIS — A419 Sepsis, unspecified organism: Secondary | ICD-10-CM | POA: Diagnosis not present

## 2020-01-23 DIAGNOSIS — N390 Urinary tract infection, site not specified: Secondary | ICD-10-CM | POA: Diagnosis not present

## 2020-01-24 DIAGNOSIS — L89154 Pressure ulcer of sacral region, stage 4: Secondary | ICD-10-CM | POA: Diagnosis not present

## 2020-01-24 DIAGNOSIS — N200 Calculus of kidney: Secondary | ICD-10-CM | POA: Diagnosis not present

## 2020-01-24 DIAGNOSIS — N390 Urinary tract infection, site not specified: Secondary | ICD-10-CM | POA: Diagnosis not present

## 2020-01-24 DIAGNOSIS — G309 Alzheimer's disease, unspecified: Secondary | ICD-10-CM | POA: Diagnosis not present

## 2020-01-24 DIAGNOSIS — A419 Sepsis, unspecified organism: Secondary | ICD-10-CM | POA: Diagnosis not present

## 2020-01-24 DIAGNOSIS — F0280 Dementia in other diseases classified elsewhere without behavioral disturbance: Secondary | ICD-10-CM | POA: Diagnosis not present

## 2020-02-02 DEATH — deceased

## 2020-05-09 IMAGING — DX DG CHEST 1V PORT
1 series · 1 of 1 positions shown · non-contrast
Comparison: 09/12/2015

CLINICAL DATA: generalized abdominal pain, n/v x 4 days. fever

EXAM:
PORTABLE CHEST 1 VIEW

[chest]
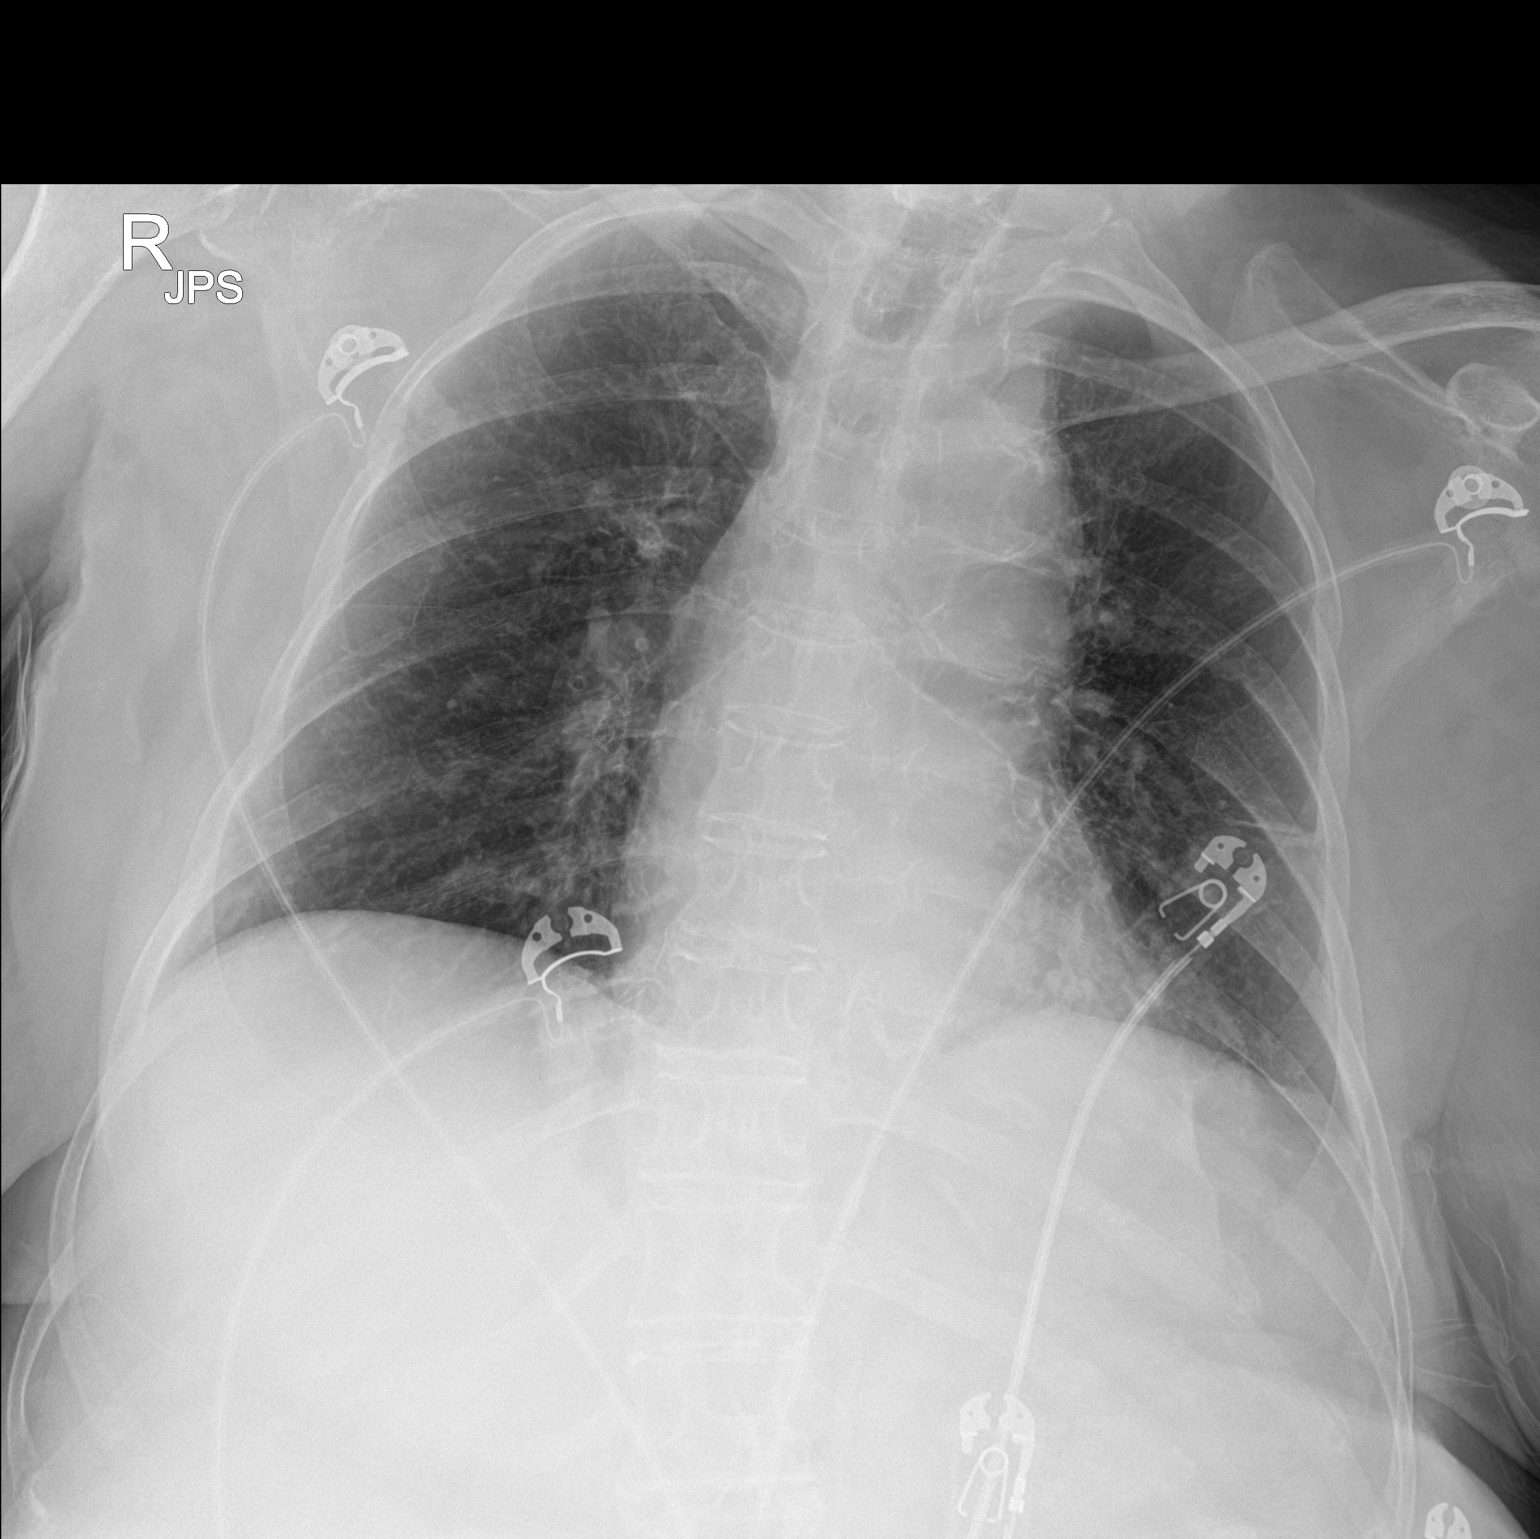

[1 of 1 positions shown; findings below may reference images not displayed]

FINDINGS: Cardiac silhouette is normal in size. No mediastinal or hilar
masses. No evidence of adenopathy.

Mild linear opacity in the left mid lung consistent with scarring or
atelectasis. Lungs otherwise clear. No pleural effusion or
pneumothorax.

Skeletal structures are grossly intact.
IMPRESSION: No active disease.

## 2020-05-09 IMAGING — CT CT ABD-PELV W/O
2 of 4 series · 16 of 46 positions shown, 18 images · non-contrast
Comparison: 05/27/2015

CLINICAL DATA: Abdominal distension.

EXAM:
CT ABDOMEN AND PELVIS WITHOUT CONTRAST
TECHNIQUE: Multidetector CT imaging of the abdomen and pelvis was performed
following the standard protocol without IV contrast.

[Series 3: a/p w/o 5mm (person_name) · axial · non-contrast · 0.91mm/px · z∈[+770,+1210]mm · 13 of 97 slices shown, 15 images]
[im 5/97  soft-tissue]
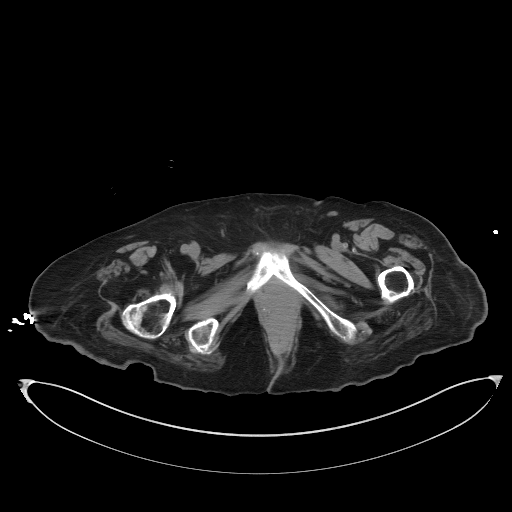
[im 5/97  bone]
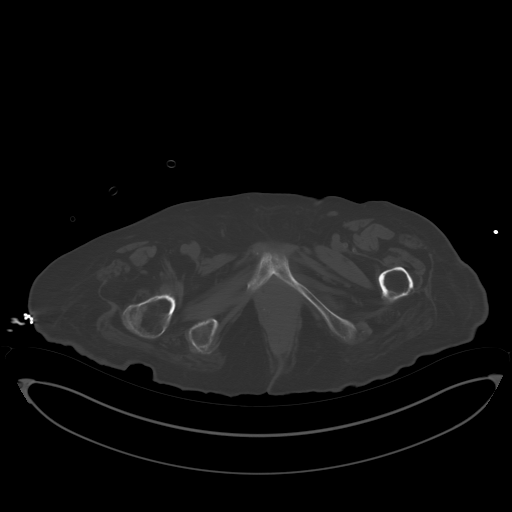
[im 13/97  soft-tissue]
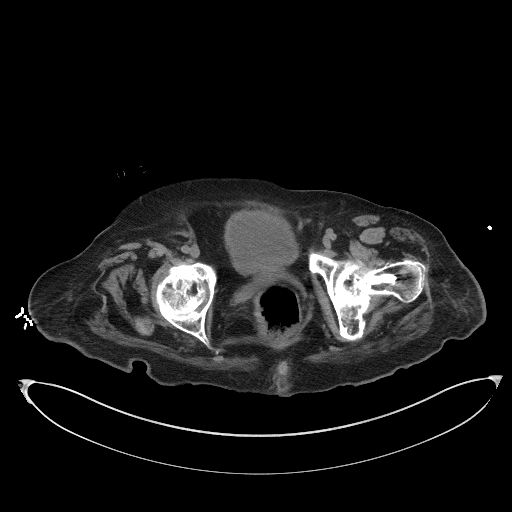
[im 21/97  soft-tissue]
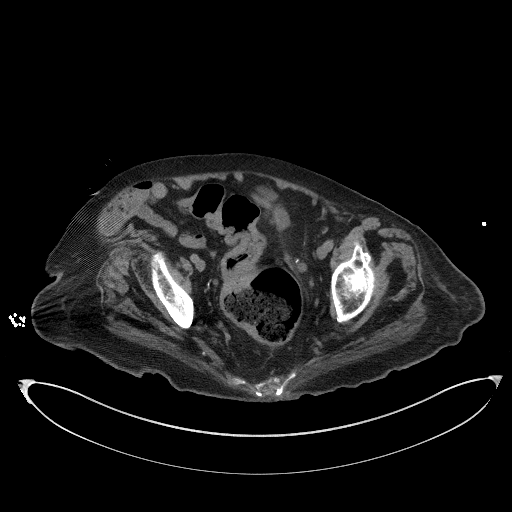
[im 29/97  soft-tissue]
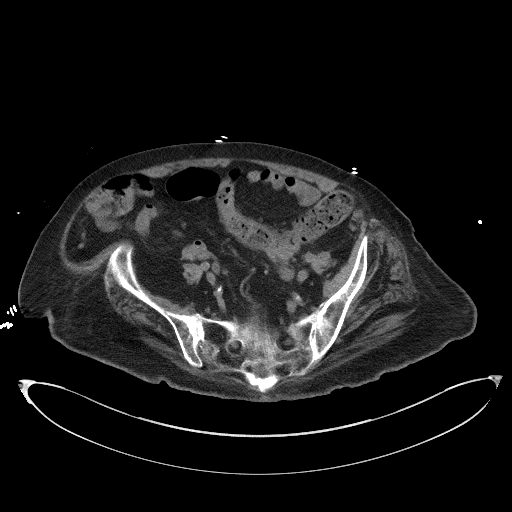
[im 33/97  soft-tissue]
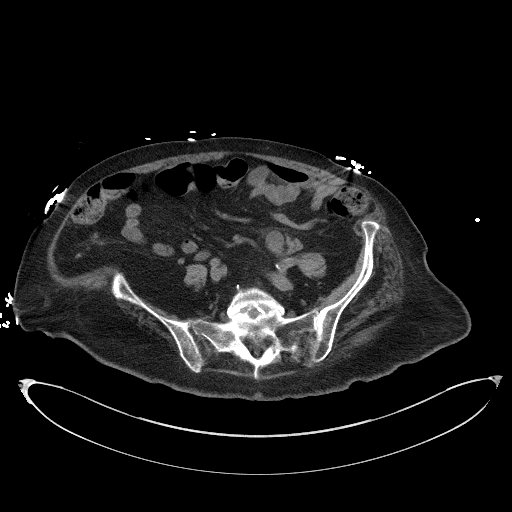
[im 41/97  soft-tissue]
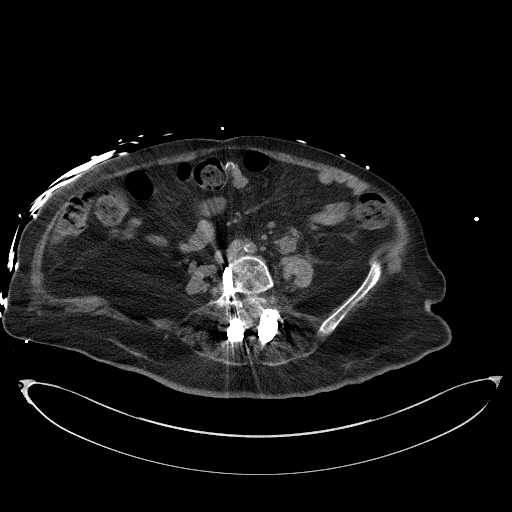
[im 49/97  soft-tissue]
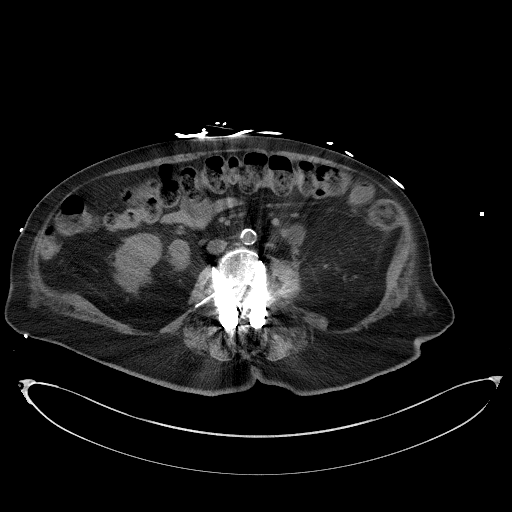
[im 57/97  soft-tissue]
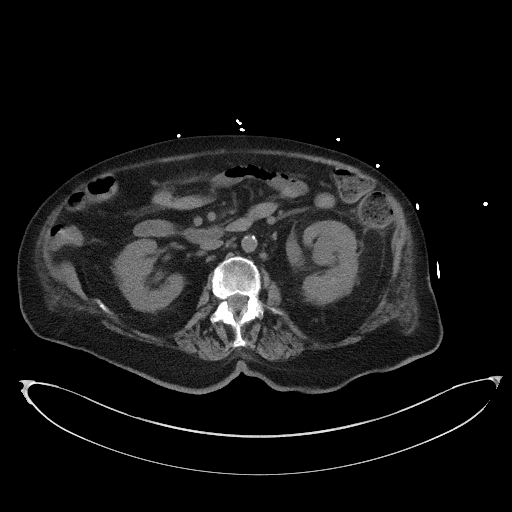
[im 65/97  soft-tissue]
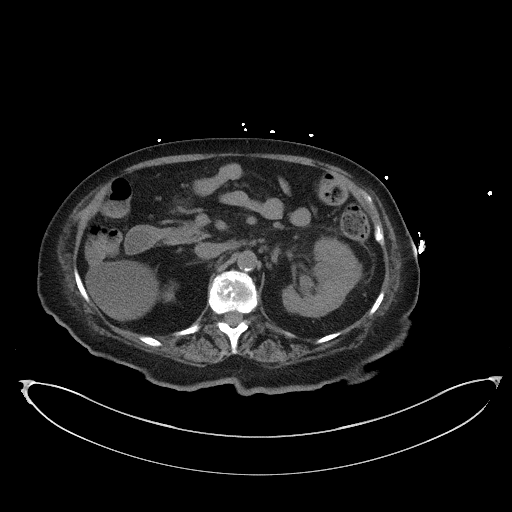
[im 65/97  bone]
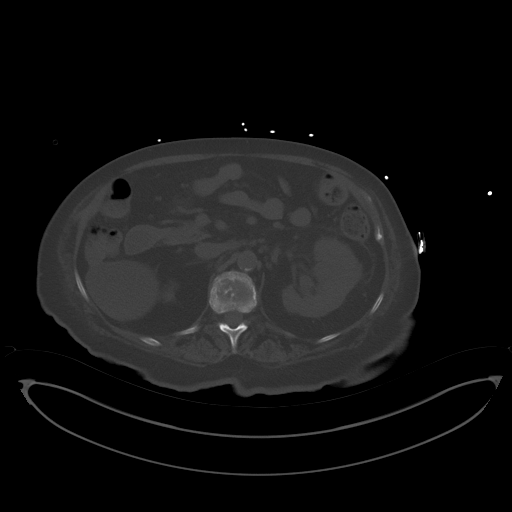
[im 69/97  soft-tissue]
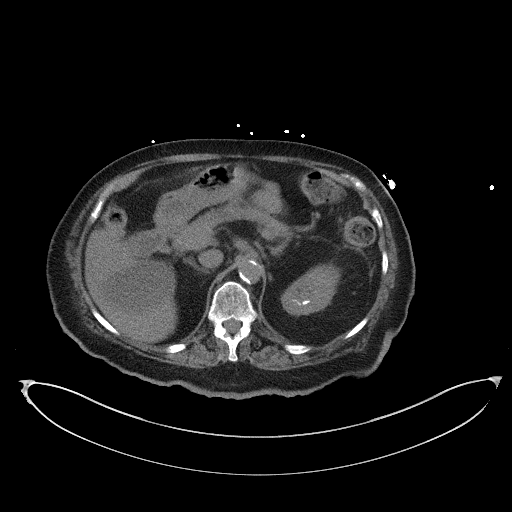
[im 77/97  soft-tissue]
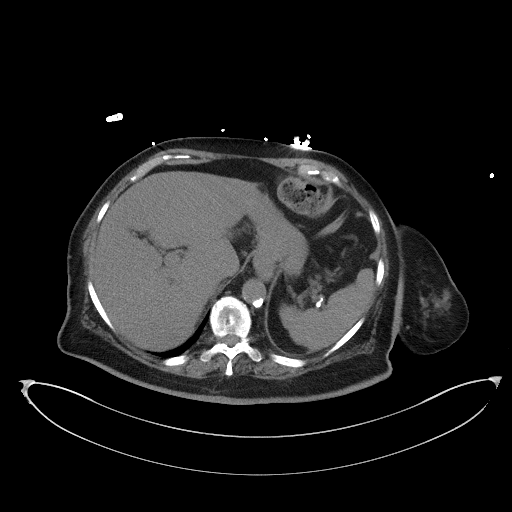
[im 85/97  soft-tissue]
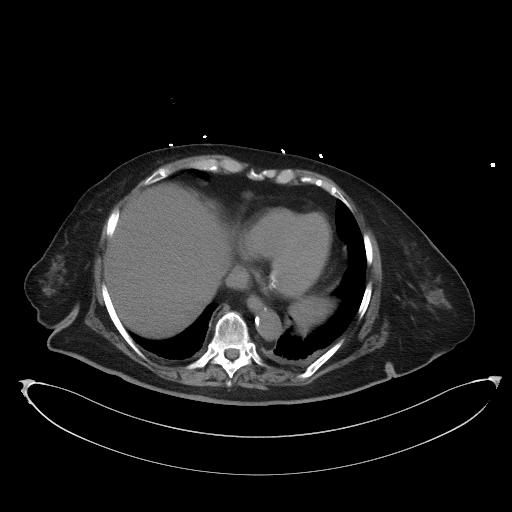
[im 93/97  soft-tissue]
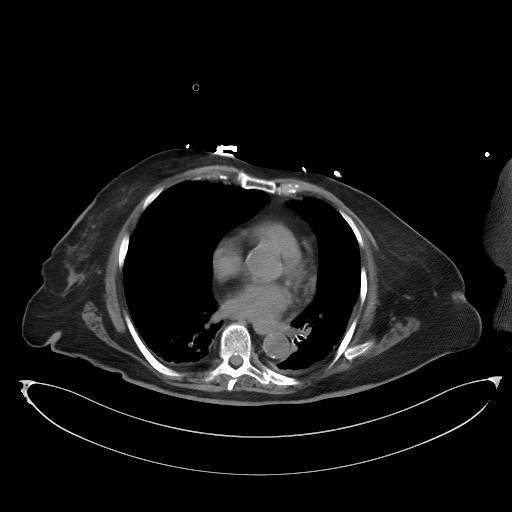

[Series 6: a/p w/o cor · coronal · non-contrast · 0.95mm/px · 3 of 151 slices shown]
[im 51/151  soft-tissue]
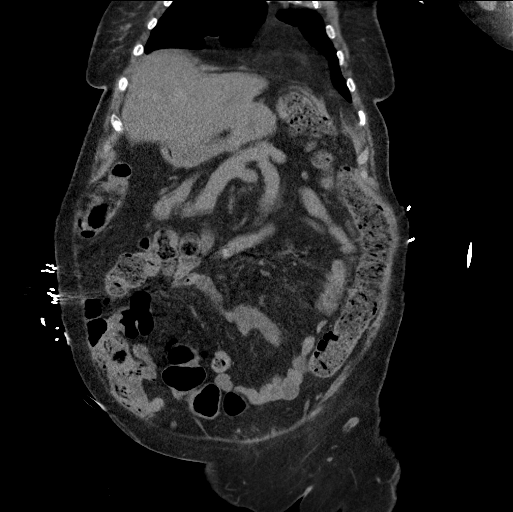
[im 67/151  soft-tissue]
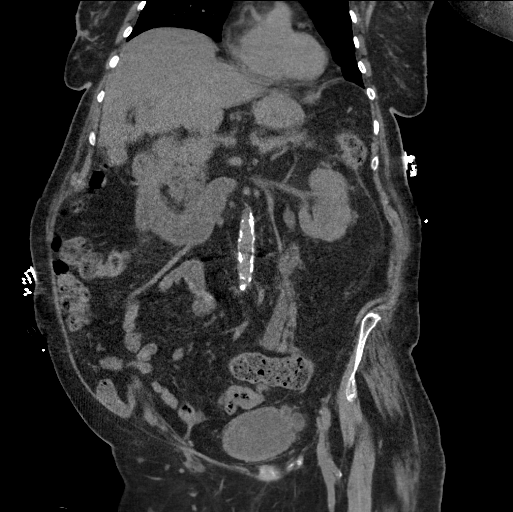
[im 84/151  soft-tissue]
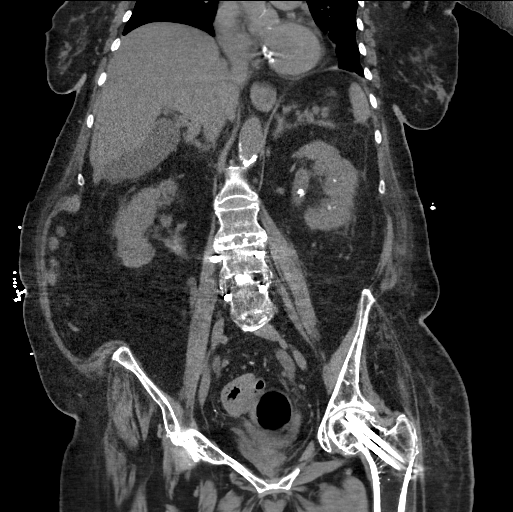

[16 of 46 positions shown; findings below may reference images not displayed]

FINDINGS: Lower chest: Small dependent pleural effusions with mild dependent
basilar atelectasis. Lung bases are largely clear.

Hepatobiliary: Gallbladder is distended. No calcified gallstones. No
liver parenchymal abnormality seen.

Pancreas: Negative

Spleen: Negative

Adrenals/Urinary Tract: No adrenal mass. No stones in the right
kidney is self. There are 2 or 3 stones in the extrarenal pelvis on
the right, apparently not causing obstruction presently. The
remainder of the right ureter is normal. The left kidney contains
multiple nonobstructing stones. There are a few stones in the renal
collecting system and renal pelvis. The ureter is markedly dilated
all the way to the bladder where there is a UVJ stone measuring 7 8
mm in size. No stone in the bladder. Small bladder diverticulum on
the left.

Stomach/Bowel: No acute abnormal bowel finding. No evidence of ileus
or obstruction. No inflammatory change seen. Some wall thickening in
the sigmoid region could be due to mild chronic diverticulosis.

Vascular/Lymphatic: Aortic atherosclerosis. No aneurysm. IVC is
normal. No retroperitoneal adenopathy.

Reproductive: Previous hysterectomy. No pelvic mass.

Other: No free fluid or air.

Musculoskeletal: Previous lumbosacral fusion. No acute bone finding.
IMPRESSION: 1. 7-8 mm stone at the left UVJ with marked left
hydroureteronephrosis. Multiple nonobstructing stones in the left
kidney, renal collecting system and extrarenal pelvis.
2. Two or 3 stones in the extrarenal pelvis on the right, apparently
not causing obstruction presently.
3. Small bilateral pleural effusions with mild dependent basilar
atelectasis.
4. Distended gallbladder without calcified gallstones.
5. Wall thickening of the sigmoid colon without a masslike
configuration. This could relate to chronic diverticulosis.

Aortic Atherosclerosis ([id number]-BH3.3).
